# Patient Record
Sex: Male | Born: 1965 | Race: Black or African American | Hispanic: No | Marital: Single | Smoking: Former smoker
Health system: Southern US, Community
[De-identification: ages and names within clinical notes are randomized; demographics above are authoritative.]

## PROBLEM LIST (undated history)

## (undated) DIAGNOSIS — Z8701 Personal history of pneumonia (recurrent): Secondary | ICD-10-CM

## (undated) DIAGNOSIS — I10 Essential (primary) hypertension: Secondary | ICD-10-CM

## (undated) DIAGNOSIS — N184 Chronic kidney disease, stage 4 (severe): Secondary | ICD-10-CM

## (undated) DIAGNOSIS — Z95810 Presence of automatic (implantable) cardiac defibrillator: Secondary | ICD-10-CM

## (undated) DIAGNOSIS — I5020 Unspecified systolic (congestive) heart failure: Secondary | ICD-10-CM

## (undated) DIAGNOSIS — I471 Supraventricular tachycardia, unspecified: Secondary | ICD-10-CM

## (undated) DIAGNOSIS — R040 Epistaxis: Secondary | ICD-10-CM

## (undated) DIAGNOSIS — I472 Ventricular tachycardia: Secondary | ICD-10-CM

## (undated) DIAGNOSIS — I429 Cardiomyopathy, unspecified: Secondary | ICD-10-CM

## (undated) DIAGNOSIS — D649 Anemia, unspecified: Secondary | ICD-10-CM

## (undated) DIAGNOSIS — I2510 Atherosclerotic heart disease of native coronary artery without angina pectoris: Secondary | ICD-10-CM

## (undated) DIAGNOSIS — E785 Hyperlipidemia, unspecified: Secondary | ICD-10-CM

## (undated) DIAGNOSIS — I4729 Other ventricular tachycardia: Secondary | ICD-10-CM

## (undated) HISTORY — DX: Anemia, unspecified: D64.9

## (undated) HISTORY — DX: Essential (primary) hypertension: I10

## (undated) HISTORY — DX: Atherosclerotic heart disease of native coronary artery without angina pectoris: I25.10

## (undated) HISTORY — DX: Hyperlipidemia, unspecified: E78.5

---

## 2006-10-24 ENCOUNTER — Ambulatory Visit: Payer: Self-pay | Admitting: Cardiology

## 2006-12-14 ENCOUNTER — Ambulatory Visit: Payer: Self-pay | Admitting: Cardiology

## 2006-12-14 ENCOUNTER — Ambulatory Visit: Payer: Self-pay | Admitting: Internal Medicine

## 2006-12-14 ENCOUNTER — Inpatient Hospital Stay (HOSPITAL_COMMUNITY): Admission: EM | Admit: 2006-12-14 | Discharge: 2006-12-21 | Payer: Self-pay | Admitting: Internal Medicine

## 2006-12-16 ENCOUNTER — Encounter: Payer: Self-pay | Admitting: Cardiology

## 2006-12-20 ENCOUNTER — Ambulatory Visit: Payer: Self-pay | Admitting: Gastroenterology

## 2007-01-07 ENCOUNTER — Ambulatory Visit: Payer: Self-pay | Admitting: Cardiology

## 2007-01-07 LAB — CONVERTED CEMR LAB
Basophils Relative: 0.2 % (ref 0.0–1.0)
CO2: 28 meq/L (ref 19–32)
Calcium: 8.6 mg/dL (ref 8.4–10.5)
Chloride: 106 meq/L (ref 96–112)
Creatinine, Ser: 2.1 mg/dL — ABNORMAL HIGH (ref 0.4–1.5)
Eosinophils Relative: 3.8 % (ref 0.0–5.0)
GFR calc Af Amer: 45 mL/min
Glucose, Bld: 98 mg/dL (ref 70–99)
Lymphocytes Relative: 17 % (ref 12.0–46.0)
Neutro Abs: 4.7 10*3/uL (ref 1.4–7.7)
Platelets: 277 10*3/uL (ref 150–400)
WBC: 6.7 10*3/uL (ref 4.5–10.5)

## 2007-01-13 ENCOUNTER — Ambulatory Visit: Payer: Self-pay

## 2007-01-13 LAB — CONVERTED CEMR LAB
CO2: 30 meq/L (ref 19–32)
Chloride: 108 meq/L (ref 96–112)
Creatinine, Ser: 2.1 mg/dL — ABNORMAL HIGH (ref 0.4–1.5)
Glucose, Bld: 82 mg/dL (ref 70–99)
Sodium: 143 meq/L (ref 135–145)

## 2007-02-19 ENCOUNTER — Ambulatory Visit: Payer: Self-pay | Admitting: Cardiology

## 2007-05-21 ENCOUNTER — Ambulatory Visit: Payer: Self-pay | Admitting: Cardiology

## 2007-07-02 ENCOUNTER — Ambulatory Visit: Payer: Self-pay | Admitting: Cardiology

## 2007-08-06 ENCOUNTER — Ambulatory Visit: Payer: Self-pay | Admitting: Cardiology

## 2007-10-07 ENCOUNTER — Inpatient Hospital Stay (HOSPITAL_COMMUNITY): Admission: EM | Admit: 2007-10-07 | Discharge: 2007-10-09 | Payer: Self-pay | Admitting: Emergency Medicine

## 2007-10-07 ENCOUNTER — Ambulatory Visit: Payer: Self-pay | Admitting: Cardiology

## 2008-01-07 ENCOUNTER — Encounter: Payer: Self-pay | Admitting: Cardiology

## 2008-01-07 ENCOUNTER — Ambulatory Visit: Payer: Self-pay | Admitting: Cardiology

## 2008-01-07 ENCOUNTER — Inpatient Hospital Stay (HOSPITAL_COMMUNITY): Admission: EM | Admit: 2008-01-07 | Discharge: 2008-01-09 | Payer: Self-pay | Admitting: Emergency Medicine

## 2008-02-06 ENCOUNTER — Inpatient Hospital Stay (HOSPITAL_COMMUNITY): Admission: EM | Admit: 2008-02-06 | Discharge: 2008-02-08 | Payer: Self-pay | Admitting: Emergency Medicine

## 2008-03-04 ENCOUNTER — Emergency Department (HOSPITAL_COMMUNITY): Admission: EM | Admit: 2008-03-04 | Discharge: 2008-03-04 | Payer: Self-pay | Admitting: Emergency Medicine

## 2008-03-25 ENCOUNTER — Inpatient Hospital Stay (HOSPITAL_COMMUNITY): Admission: EM | Admit: 2008-03-25 | Discharge: 2008-03-29 | Payer: Self-pay | Admitting: Emergency Medicine

## 2008-03-26 ENCOUNTER — Encounter: Payer: Self-pay | Admitting: Internal Medicine

## 2008-03-30 ENCOUNTER — Ambulatory Visit: Payer: Self-pay | Admitting: Internal Medicine

## 2008-04-22 ENCOUNTER — Ambulatory Visit: Payer: Self-pay | Admitting: Cardiology

## 2008-04-22 ENCOUNTER — Inpatient Hospital Stay (HOSPITAL_COMMUNITY): Admission: EM | Admit: 2008-04-22 | Discharge: 2008-04-26 | Payer: Self-pay | Admitting: Emergency Medicine

## 2008-04-23 ENCOUNTER — Encounter (INDEPENDENT_AMBULATORY_CARE_PROVIDER_SITE_OTHER): Payer: Self-pay | Admitting: Family Medicine

## 2008-05-19 ENCOUNTER — Ambulatory Visit: Payer: Self-pay | Admitting: Cardiology

## 2008-05-19 ENCOUNTER — Inpatient Hospital Stay (HOSPITAL_COMMUNITY): Admission: AD | Admit: 2008-05-19 | Discharge: 2008-05-21 | Payer: Self-pay | Admitting: Cardiology

## 2008-05-28 ENCOUNTER — Ambulatory Visit: Payer: Self-pay | Admitting: Cardiology

## 2008-05-28 ENCOUNTER — Ambulatory Visit: Payer: Self-pay

## 2008-05-28 LAB — CONVERTED CEMR LAB
BUN: 19 mg/dL (ref 6–23)
CO2: 28 meq/L (ref 19–32)
Calcium: 8.5 mg/dL (ref 8.4–10.5)
Creatinine, Ser: 2.2 mg/dL — ABNORMAL HIGH (ref 0.4–1.5)
Glucose, Bld: 95 mg/dL (ref 70–99)

## 2008-06-09 ENCOUNTER — Inpatient Hospital Stay (HOSPITAL_COMMUNITY): Admission: EM | Admit: 2008-06-09 | Discharge: 2008-06-12 | Payer: Self-pay | Admitting: Emergency Medicine

## 2008-07-08 ENCOUNTER — Emergency Department (HOSPITAL_COMMUNITY): Admission: EM | Admit: 2008-07-08 | Discharge: 2008-07-08 | Payer: Self-pay | Admitting: Emergency Medicine

## 2008-07-25 ENCOUNTER — Inpatient Hospital Stay (HOSPITAL_COMMUNITY): Admission: EM | Admit: 2008-07-25 | Discharge: 2008-07-28 | Payer: Self-pay | Admitting: Emergency Medicine

## 2008-07-25 ENCOUNTER — Ambulatory Visit: Payer: Self-pay | Admitting: Cardiology

## 2008-09-08 ENCOUNTER — Ambulatory Visit: Payer: Self-pay | Admitting: Cardiology

## 2008-10-03 ENCOUNTER — Inpatient Hospital Stay (HOSPITAL_COMMUNITY): Admission: EM | Admit: 2008-10-03 | Discharge: 2008-10-08 | Payer: Self-pay | Admitting: Emergency Medicine

## 2008-10-03 ENCOUNTER — Ambulatory Visit: Payer: Self-pay | Admitting: Cardiology

## 2008-10-06 ENCOUNTER — Encounter: Payer: Self-pay | Admitting: Cardiology

## 2008-11-22 DIAGNOSIS — N189 Chronic kidney disease, unspecified: Secondary | ICD-10-CM

## 2008-11-22 DIAGNOSIS — D649 Anemia, unspecified: Secondary | ICD-10-CM | POA: Insufficient documentation

## 2008-11-22 DIAGNOSIS — E785 Hyperlipidemia, unspecified: Secondary | ICD-10-CM

## 2008-11-22 DIAGNOSIS — I5022 Chronic systolic (congestive) heart failure: Secondary | ICD-10-CM | POA: Insufficient documentation

## 2008-11-22 DIAGNOSIS — I2510 Atherosclerotic heart disease of native coronary artery without angina pectoris: Secondary | ICD-10-CM | POA: Insufficient documentation

## 2008-11-22 DIAGNOSIS — I10 Essential (primary) hypertension: Secondary | ICD-10-CM | POA: Insufficient documentation

## 2008-11-24 ENCOUNTER — Ambulatory Visit: Payer: Self-pay | Admitting: Cardiology

## 2008-12-16 ENCOUNTER — Inpatient Hospital Stay (HOSPITAL_COMMUNITY): Admission: EM | Admit: 2008-12-16 | Discharge: 2008-12-19 | Payer: Self-pay | Admitting: Emergency Medicine

## 2009-01-19 ENCOUNTER — Ambulatory Visit: Payer: Self-pay | Admitting: Cardiology

## 2009-02-28 ENCOUNTER — Encounter (INDEPENDENT_AMBULATORY_CARE_PROVIDER_SITE_OTHER): Payer: Self-pay | Admitting: *Deleted

## 2009-04-25 ENCOUNTER — Ambulatory Visit: Payer: Self-pay | Admitting: Internal Medicine

## 2009-04-26 ENCOUNTER — Inpatient Hospital Stay (HOSPITAL_COMMUNITY): Admission: EM | Admit: 2009-04-26 | Discharge: 2009-04-28 | Payer: Self-pay | Admitting: Emergency Medicine

## 2009-04-26 ENCOUNTER — Encounter: Payer: Self-pay | Admitting: Cardiology

## 2009-05-10 ENCOUNTER — Inpatient Hospital Stay (HOSPITAL_COMMUNITY): Admission: AD | Admit: 2009-05-10 | Discharge: 2009-05-12 | Payer: Self-pay | Admitting: Cardiology

## 2009-05-10 ENCOUNTER — Ambulatory Visit: Payer: Self-pay | Admitting: Cardiology

## 2009-05-22 ENCOUNTER — Ambulatory Visit: Payer: Self-pay | Admitting: Cardiology

## 2009-05-22 ENCOUNTER — Encounter: Payer: Self-pay | Admitting: Emergency Medicine

## 2009-05-23 ENCOUNTER — Inpatient Hospital Stay (HOSPITAL_COMMUNITY): Admission: RE | Admit: 2009-05-23 | Discharge: 2009-05-26 | Payer: Self-pay | Admitting: Cardiology

## 2009-06-02 ENCOUNTER — Inpatient Hospital Stay (HOSPITAL_COMMUNITY): Admission: EM | Admit: 2009-06-02 | Discharge: 2009-06-04 | Payer: Self-pay | Admitting: Emergency Medicine

## 2009-06-02 ENCOUNTER — Ambulatory Visit: Payer: Self-pay | Admitting: Cardiology

## 2009-06-15 ENCOUNTER — Ambulatory Visit: Payer: Self-pay | Admitting: Cardiology

## 2009-06-25 ENCOUNTER — Inpatient Hospital Stay (HOSPITAL_COMMUNITY): Admission: EM | Admit: 2009-06-25 | Discharge: 2009-06-29 | Payer: Self-pay | Admitting: Emergency Medicine

## 2009-06-25 ENCOUNTER — Ambulatory Visit: Payer: Self-pay | Admitting: Cardiology

## 2009-07-13 ENCOUNTER — Ambulatory Visit: Payer: Self-pay | Admitting: Cardiology

## 2009-07-16 ENCOUNTER — Inpatient Hospital Stay (HOSPITAL_COMMUNITY): Admission: EM | Admit: 2009-07-16 | Discharge: 2009-07-18 | Payer: Self-pay | Admitting: Emergency Medicine

## 2009-07-16 ENCOUNTER — Ambulatory Visit: Payer: Self-pay | Admitting: Cardiology

## 2009-07-27 ENCOUNTER — Telehealth (INDEPENDENT_AMBULATORY_CARE_PROVIDER_SITE_OTHER): Payer: Self-pay | Admitting: *Deleted

## 2009-08-10 ENCOUNTER — Encounter: Payer: Self-pay | Admitting: Cardiology

## 2009-08-10 ENCOUNTER — Ambulatory Visit: Payer: Self-pay | Admitting: Cardiology

## 2009-09-05 ENCOUNTER — Inpatient Hospital Stay (HOSPITAL_COMMUNITY): Admission: EM | Admit: 2009-09-05 | Discharge: 2009-09-08 | Payer: Self-pay | Admitting: Emergency Medicine

## 2009-10-19 ENCOUNTER — Ambulatory Visit: Payer: Self-pay | Admitting: Cardiology

## 2009-12-07 ENCOUNTER — Ambulatory Visit: Payer: Self-pay | Admitting: Cardiology

## 2009-12-29 ENCOUNTER — Telehealth (INDEPENDENT_AMBULATORY_CARE_PROVIDER_SITE_OTHER): Payer: Self-pay | Admitting: *Deleted

## 2010-01-01 ENCOUNTER — Inpatient Hospital Stay (HOSPITAL_COMMUNITY): Admission: EM | Admit: 2010-01-01 | Discharge: 2010-01-03 | Payer: Self-pay | Admitting: Emergency Medicine

## 2010-01-04 ENCOUNTER — Ambulatory Visit: Payer: Self-pay | Admitting: Cardiology

## 2010-02-22 ENCOUNTER — Ambulatory Visit: Payer: Self-pay | Admitting: Internal Medicine

## 2010-02-22 ENCOUNTER — Ambulatory Visit: Payer: Self-pay | Admitting: Vascular Surgery

## 2010-02-22 ENCOUNTER — Inpatient Hospital Stay (HOSPITAL_COMMUNITY): Admission: EM | Admit: 2010-02-22 | Discharge: 2010-02-26 | Payer: Self-pay | Admitting: Emergency Medicine

## 2010-02-22 ENCOUNTER — Encounter (INDEPENDENT_AMBULATORY_CARE_PROVIDER_SITE_OTHER): Payer: Self-pay | Admitting: Internal Medicine

## 2010-02-26 IMAGING — CR DG CHEST 2V
2 series · 2 of 2 positions shown · non-contrast
Comparison: 10/03/2008

CLINICAL DATA: Chest pain

CHEST - 2 VIEW

[view not recorded (1 of 2)]
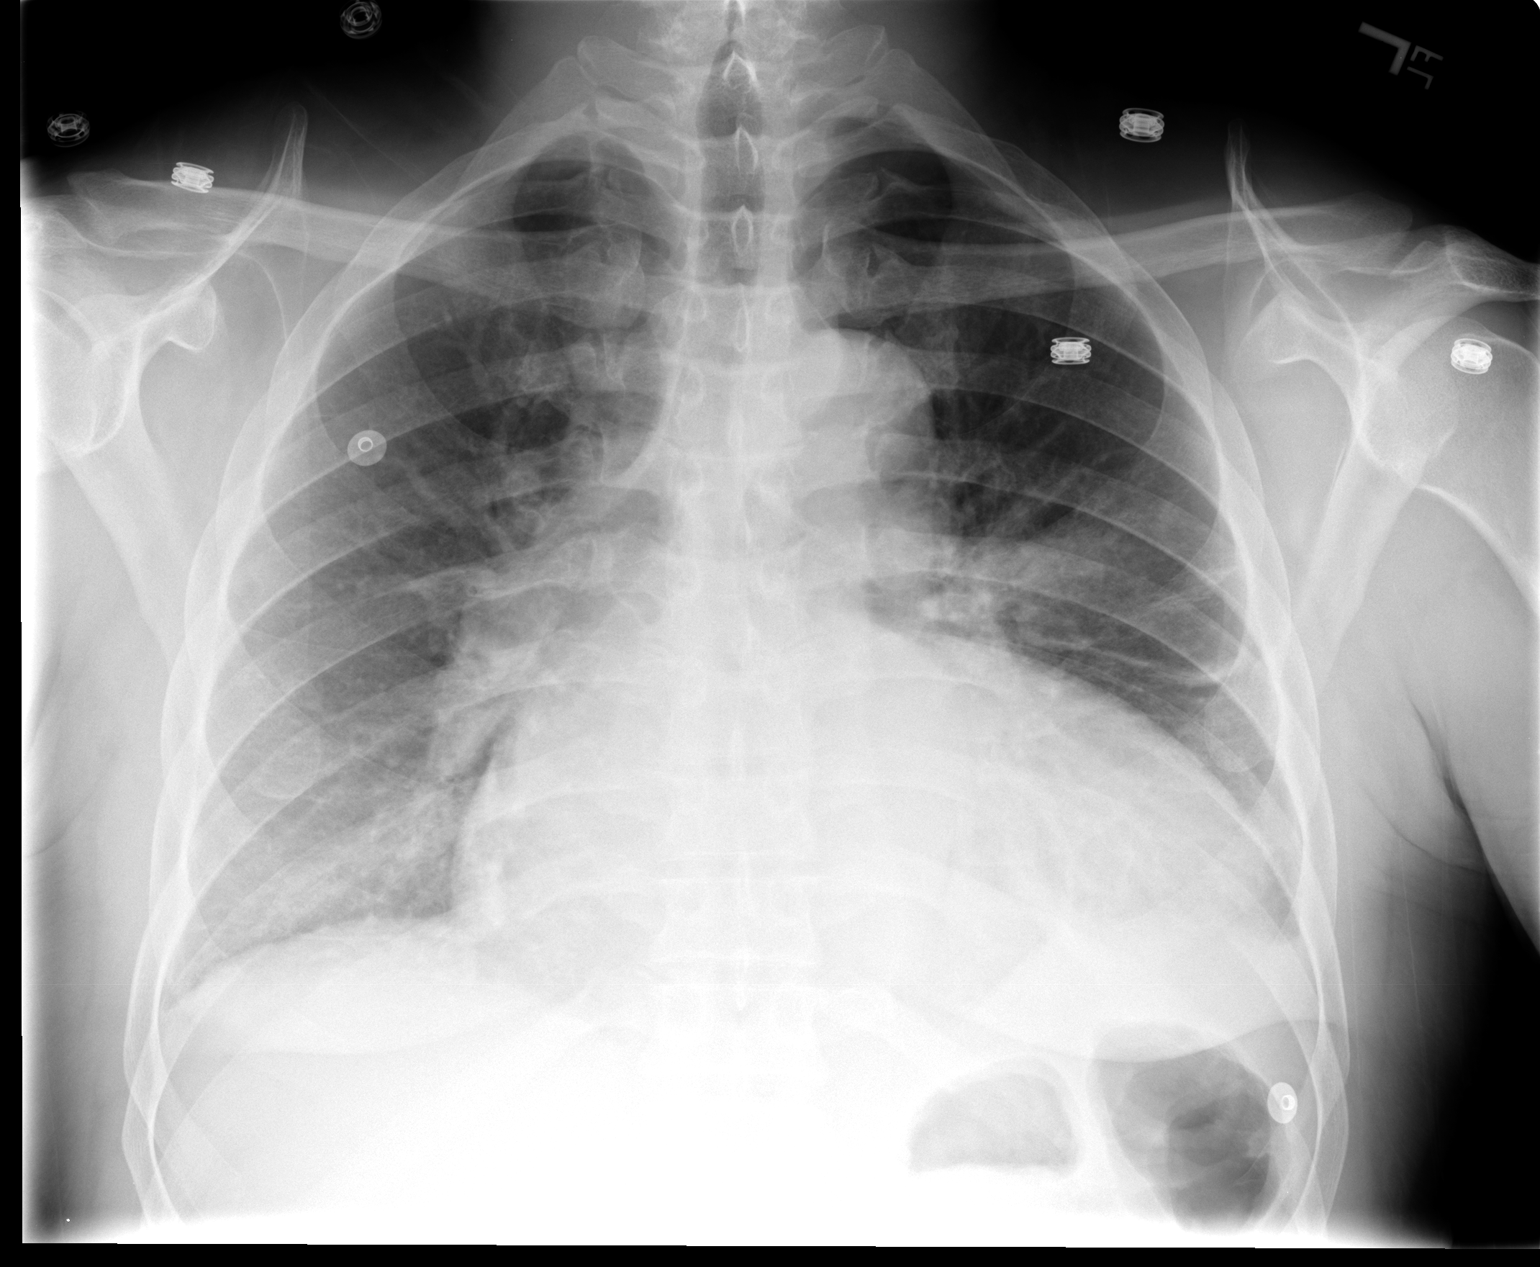

[view not recorded (2 of 2)]
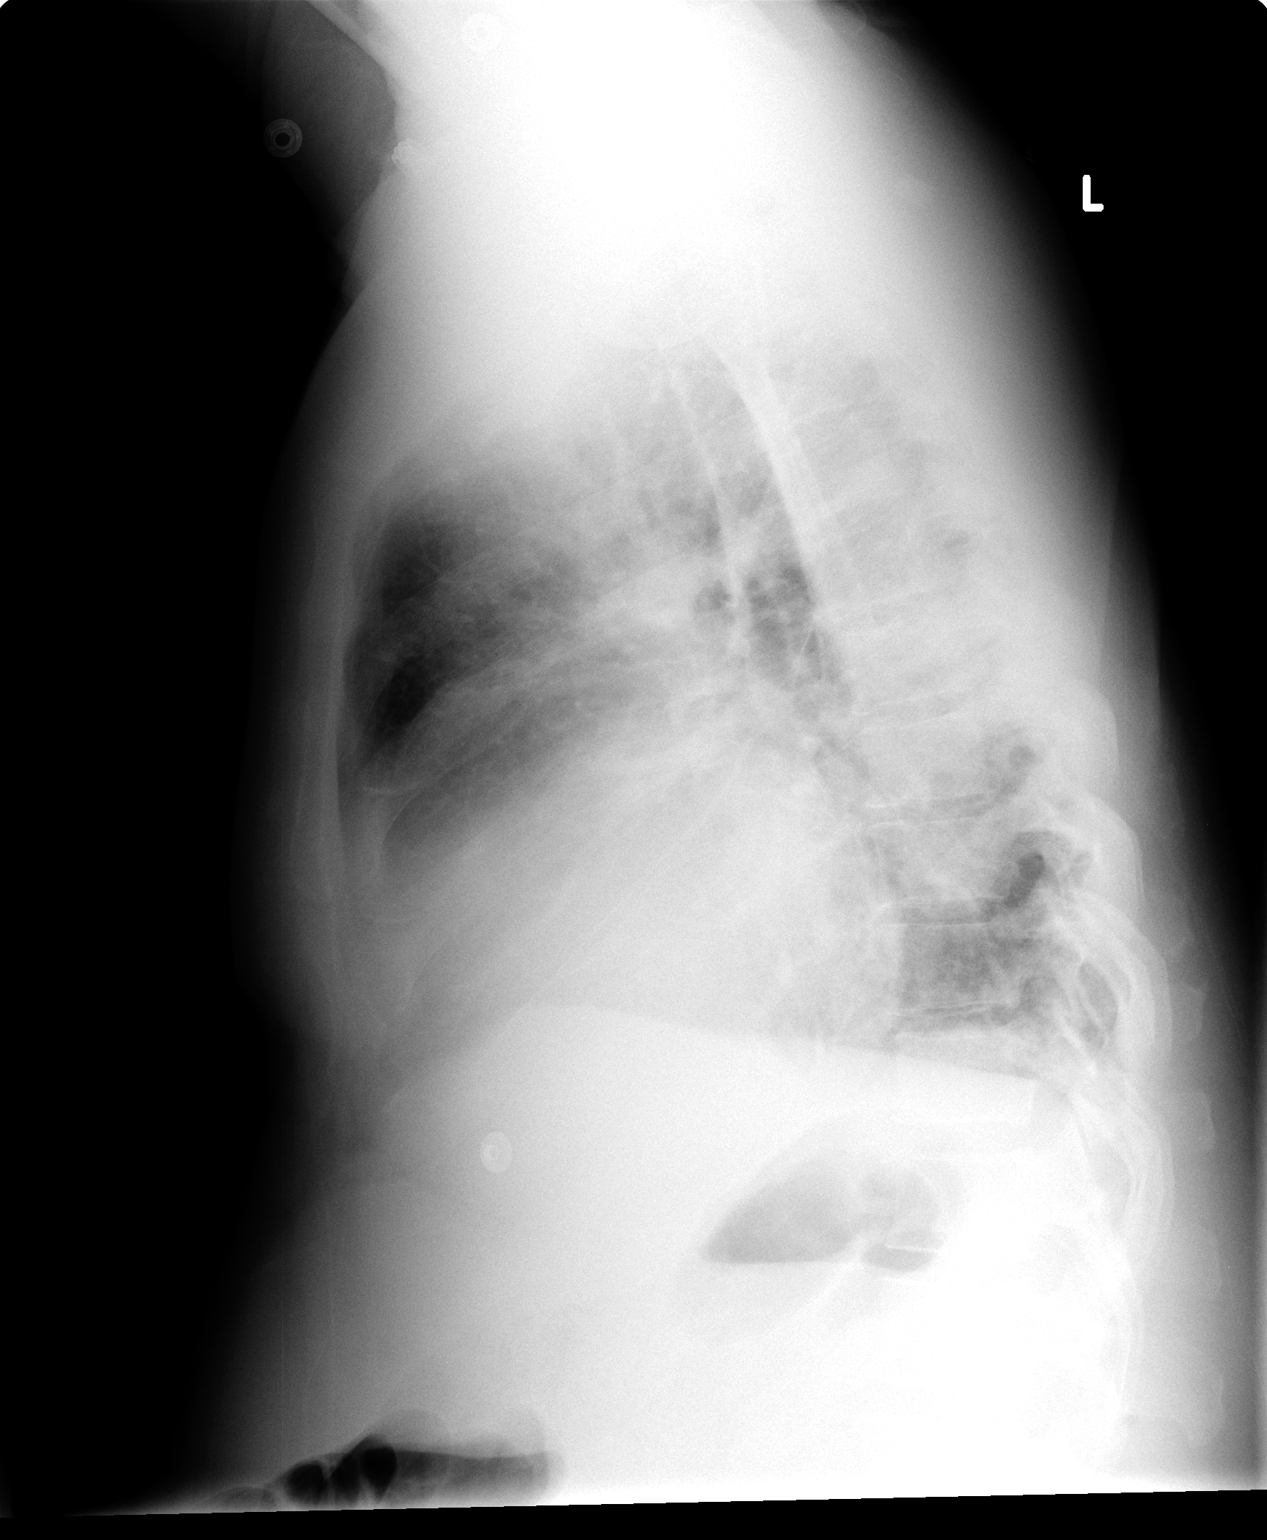

[2 of 2 positions shown; findings below may reference images not displayed]

FINDINGS: Cardiomegaly with pulmonary vascular congestion.
Minimal interstitial edema compatible with CHF.
Bibasilar atelectasis.
Minimal peribronchial thickening.
Elongation of aorta.
No pneumothorax or segmental consolidation.
Tiny left pleural effusion.
IMPRESSION: Mild CHF with bibasilar atelectasis.

REF:A1 DICTATED: 10/05/2008 [DATE]

## 2010-03-03 ENCOUNTER — Encounter: Payer: Self-pay | Admitting: Internal Medicine

## 2010-03-07 ENCOUNTER — Encounter: Payer: Self-pay | Admitting: Cardiology

## 2010-03-08 ENCOUNTER — Ambulatory Visit: Payer: Self-pay | Admitting: Cardiology

## 2010-03-28 ENCOUNTER — Ambulatory Visit: Payer: Self-pay | Admitting: Cardiology

## 2010-03-28 ENCOUNTER — Inpatient Hospital Stay (HOSPITAL_COMMUNITY): Admission: EM | Admit: 2010-03-28 | Discharge: 2010-04-01 | Payer: Self-pay | Admitting: Emergency Medicine

## 2010-03-28 ENCOUNTER — Encounter (INDEPENDENT_AMBULATORY_CARE_PROVIDER_SITE_OTHER): Payer: Self-pay | Admitting: Internal Medicine

## 2010-03-29 ENCOUNTER — Encounter (INDEPENDENT_AMBULATORY_CARE_PROVIDER_SITE_OTHER): Payer: Self-pay | Admitting: Internal Medicine

## 2010-03-29 ENCOUNTER — Ambulatory Visit: Payer: Self-pay | Admitting: Vascular Surgery

## 2010-04-28 ENCOUNTER — Inpatient Hospital Stay (HOSPITAL_COMMUNITY): Admission: EM | Admit: 2010-04-28 | Discharge: 2010-05-02 | Payer: Self-pay | Admitting: Emergency Medicine

## 2010-04-28 ENCOUNTER — Ambulatory Visit: Payer: Self-pay | Admitting: Internal Medicine

## 2010-05-03 ENCOUNTER — Encounter: Payer: Self-pay | Admitting: Cardiology

## 2010-05-19 ENCOUNTER — Encounter: Payer: Self-pay | Admitting: Cardiology

## 2010-06-14 ENCOUNTER — Telehealth (INDEPENDENT_AMBULATORY_CARE_PROVIDER_SITE_OTHER): Payer: Self-pay | Admitting: *Deleted

## 2010-06-15 ENCOUNTER — Encounter: Payer: Self-pay | Admitting: Cardiology

## 2010-09-03 HISTORY — PX: CARDIAC CATHETERIZATION: SHX172

## 2010-10-03 NOTE — Progress Notes (Signed)
   Request recieved from DDS sent to [facility]  December 29, 2009 9:49 AM

## 2010-10-03 NOTE — Assessment & Plan Note (Signed)
Summary: [name]      Allergies Added: NKDA  Visit Type:  Follow-up Primary Provider:  None  CC:  HTN and CHF.  History of Present Illness: The patient presents for followup of his very difficult to control hypertension. For the first time his blood pressure in the office is below 200/100. He's been compliant with all of his medications. He says he's feeling well and not having any new shortness of breath, PND or orthopnea. He's had no weight gain. He's had no chest pressure, neck or arm discomfort.  Current Medications (verified): 1)  Furosemide 40 Mg Tabs (Furosemide) .Marland Kitchen.. 1 By Mouth Two Times A Day 2)  Coreg 25 Mg Tabs (Carvedilol) .Marland Kitchen.. 1 By Mouth Two Times A Day 3)  Hydralazine Hcl 100 Mg Tabs (Hydralazine Hcl) .Marland Kitchen.. 1 Tabler Three Times A Day 4)  Clonidine Hcl 0.2 Mg Tabs (Clonidine Hcl) .Marland Kitchen.. 1 By Mouth Three Times A Day 5)  Aspirin 325 Mg  Tabs (Aspirin) .Marland Kitchen.. 1 By Mouth Daily 6)  Nitrostat 0.4 Mg Subl (Nitroglycerin) .... As Needed 7)  Ferrous Sulfate 325 (65 Fe) Mg Tabs (Ferrous Sulfate) .Marland Kitchen.. 1 By Mouth Two Times A Day 8)  Simvastatin 20 Mg Tabs (Simvastatin) .Marland Kitchen.. 1 By Mouth Daily 9)  Isosorbide Mononitrate Cr 120 Mg Xr24h-Tab (Isosorbide Mononitrate) .... One Daily  Allergies (verified): No Known Drug Allergies  Past History:  Past Medical History: Reviewed history from 11/22/2008 and no changes required. 1. Coronary artery disease.       a.     History of non-ST-elevation myocardial infarction, April        2008, in the setting of profound epistaxis and anemia.  Cardiac        catheterization at that time demonstrated a 50% mid left        circumflex lesion.  His RCA was severely diseased with 80%        proximal disease in 3 separate locations, 90% mid disease with        ulcerated plaque, RV marginal totally occluded, and the mid RCA        was totally occluded with left-to-right collaterals to the distal        RCA.  Medical therapy was pursued at that  time.   2. Echocardiogram, August 2009, EF 25%, apical/posterior akinesis,       inferior akinesis, moderate LVH, mild MR, mild decreased RV       function.   3. Hypertension.   4. Hyperlipidemia.   5. Chronic kidney disease with average creatinine of about 2.3.   6. Chronic systolic congestive heart failure.   7. Ischemic cardiomyopathy.   8. Anemia.   Review of Systems       As stated in the HPI and negative for all other systems.   Vital Signs:  Patient profile:   45 year old male Height:      70 inches Weight:      199 pounds BMI:     28.66 Pulse rate:   64 / minute Resp:     16 per minute BP sitting:   150 / 98  (right arm)  Vitals Entered By: Marrion Coy, CNA (December 07, 2009 1:40 PM)  Physical Exam  General:  Well developed, well nourished, in no acute distress. Head:  normocephalic and atraumatic Eyes:  PERRLA/EOM intact; conjunctiva and lids normal. Mouth:  Teeth, gums and palate normal. Oral mucosa normal. Neck:  Neck supple, no JVD. No masses, thyromegaly or  abnormal cervical nodes. Chest Wall:  no deformities or breast masses noted Lungs:  Clear bilaterally to auscultation and percussion. Heart:  S1 and S2 within normal limits, no S3, positive S4, no clicks, no rubs, no murmurs. Abdomen:  Bowel sounds positive; abdomen soft and non-tender without masses, organomegaly, or hernias noted. No hepatosplenomegaly. Msk:  Back normal, normal gait. Muscle strength and tone normal. Pulses:  pulses normal in all 4 extremities Extremities:  No clubbing or cyanosis. Neurologic:  Alert and oriented x 3. Skin:  Intact without lesions or rashes. Psych:  Normal affect.   Impression & Recommendations:  Problem # 1:  CHRONIC SYSTOLIC HEART FAILURE (ICD-428.22) He seems to be doing well with class I symptoms. No further cardiovascular testing or change in therapy is indicated.  Problem # 2:  HYPERTENSION (ICD-401.9) This is as good as his blood pressure has been since I've  been taking care of him. I would not change his regimen at this point. I suspect improved adherence is the reason for his blood pressure control.  Problem # 3:  CAD (ICD-414.00) He his having no chest pain. No further cardiovascular testing is suggested.  Patient Instructions: 1)  Your physician recommends that you schedule a follow-up appointment in: 4 months in [place] 2)  Your physician recommends that you continue on your current medications as directed. Please refer to the Current Medication list given to you today.

## 2010-10-03 NOTE — Letter (Signed)
Summary: Appointment -missed  [facility] [address]   [place], [place] [address]   Phone: [phone]  Fax: [phone]     March 03, 2010 MRN: [id number]     HEVER CASTILLEJA [address] [place], [place]  [address]     Dear Mr. ZUNKER,  Our records indicate you missed your appointment on March 03, 2010                        with Dr. Johney Frame.   It is very important that we reach you to reschedule this appointment. We look forward to participating in your health care needs.   Please contact us at the number listed above at your earliest convenience to reschedule this appointment.   Sincerely,    Glass blower/designer

## 2010-10-03 NOTE — Assessment & Plan Note (Signed)
Summary: [facility]  Medications Added COREG 25 MG TABS (CARVEDILOL) 1 by mouth two times a day NITROSTAT 0.4 MG SUBL (NITROGLYCERIN) as needed ISOSORBIDE MONONITRATE CR 120 MG XR24H-TAB (ISOSORBIDE MONONITRATE) out      Allergies Added: NKDA  Visit Type:  Follow-up Primary Provider:  None  CC:  Cardiomyopathy/HTN.  History of Present Illness: The patient presents for followup of his cardiomyopathy and hypertension. Unfortunately he's been out of his isosorbide for several days. He's been taking his other medications. He actually feels well. He denies any shortness of breath, PND or orthopnea. He is not having any chest pressure, neck or arm discomfort. He is not having any palpitations, presyncope or syncope. He has had no weight gain or edema. He is compliant with salt restriction. He denies any substance abuse.  Current Medications (verified): 1)  Furosemide 40 Mg Tabs (Furosemide) .Marland Kitchen.. 1 By Mouth Two Times A Day 2)  Coreg 25 Mg Tabs (Carvedilol) .Marland Kitchen.. 1 By Mouth Two Times A Day 3)  Hydralazine Hcl 100 Mg Tabs (Hydralazine Hcl) .Marland Kitchen.. 1 Tabler Three Times A Day 4)  Clonidine Hcl 0.2 Mg Tabs (Clonidine Hcl) .Marland Kitchen.. 1 By Mouth Three Times A Day 5)  Aspirin 325 Mg  Tabs (Aspirin) .Marland Kitchen.. 1 By Mouth Daily 6)  Nitrostat 0.4 Mg Subl (Nitroglycerin) .... As Needed 7)  Ferrous Sulfate 325 (65 Fe) Mg Tabs (Ferrous Sulfate) .Marland Kitchen.. 1 By Mouth Two Times A Day 8)  Simvastatin 20 Mg Tabs (Simvastatin) .Marland Kitchen.. 1 By Mouth Daily 9)  Isosorbide Mononitrate Cr 120 Mg Xr24h-Tab (Isosorbide Mononitrate) .... Out  Allergies (verified): No Known Drug Allergies  Past History:  Past Medical History: 1. Coronary artery disease.       History of non-ST-elevation myocardial infarction, April        2008, in the setting of profound epistaxis and anemia.  Cardiac        catheterization at that time demonstrated a 50% mid left        circumflex lesion.  His RCA was severely diseased with 80%        proximal  disease in 3 separate locations, 90% mid disease with        ulcerated plaque, RV marginal totally occluded, and the mid RCA        was totally occluded with left-to-right collaterals to the distal        RCA.  Medical therapy was pursued at that time.   2. Echocardiogram, August 2009, EF 25%, apical/posterior akinesis,       inferior akinesis, moderate LVH, mild MR, mild decreased RV       function.   3. Hypertension.   4. Hyperlipidemia.   5. Chronic kidney disease with average creatinine of about 2.3.   6. Chronic systolic congestive heart failure.   7. Ischemic cardiomyopathy.   8. Anemia.   Review of Systems       As stated in the HPI and negative for all other systems.   Vital Signs:  Patient profile:   45 year old male Height:      70 inches Weight:      191 pounds BMI:     27.50 Pulse rate:   50 / minute Resp:     16 per minute BP sitting:   178 / 120  (right arm)  Vitals Entered By: Marrion Coy, CNA (Jan 04, 2010 2:47 PM)  Physical Exam  General:  Well developed, well nourished, in no acute distress. Head:  normocephalic  and atraumatic Eyes:  PERRLA/EOM intact; conjunctiva and lids normal. Mouth:   Oral mucosa normal. Neck:  Neck supple, no JVD. No masses, thyromegaly or abnormal cervical nodes. Chest Wall:  no deformities or breast masses noted Lungs:  Clear bilaterally to auscultation and percussion. Heart:  S1 and S2 within normal limits, no S3, positive S4, no clicks, no rubs, no murmurs. Abdomen:  Bowel sounds positive; abdomen soft and non-tender without masses, organomegaly, or hernias noted. No hepatosplenomegaly. Msk:  Back normal, normal gait. Muscle strength and tone normal. Extremities:  No clubbing or cyanosis. Neurologic:  Alert and oriented x 3. Skin:  Intact without lesions or rashes. Cervical Nodes:  no significant adenopathy Inguinal Nodes:  no significant adenopathy Psych:  Normal affect.   EKG  Procedure date:   01/04/2010  Findings:      Sinus rhythm, LVH, rate 50, no change from previous  Impression & Recommendations:  Problem # 1:  HYPERTENSION (ICD-401.9) Unfortunately his blood pressure is elevated again today. He was off of his isosorbide. He had the best blood pressure I had seen last time when he was taking all of his meds. He will take an extra half of a clonidine for the next few mornings until he gets his isosorbide renewed. He will continue the other meds.  Problem # 2:  CHRONIC SYSTOLIC HEART FAILURE (ICD-428.22)  The patient has demonstrated compliance over several visits. I think it is now time to consider ICD placement in this patient with mixed ischemic and nonischemic cardiomyopathy. He'll otherwise continue the meds as listed.  Orders: EKG w/ Interpretation (93000) EP Referral (Cardiology EP Ref )  Problem # 3:  CAD (ICD-414.00) He is having no ongoing chest pain. No further cardiovascular testing is suggested.  Patient Instructions: 1)  Your physician recommends that you schedule a follow-up appointment in: 2 months with Dr Antoine Poche in [place] 2)  Your physician recommends that you continue on your current medications as directed. Please refer to the Current Medication list given to you today. 3)  You have been referred to Dr Johney Frame for ICD placement Prescriptions: ISOSORBIDE MONONITRATE CR 120 MG XR24H-TAB (ISOSORBIDE MONONITRATE) out  #30 x 11   Entered by:   Charolotte Capuchin, RN   Authorized by:   Rollene Rotunda, MD, [facility]   Signed by:   Charolotte Capuchin, RN on 01/04/2010   Method used:   Faxed to ...       [facility] [employer] (mail-order)       [address] [place], [place]  [address]       Ph: [phone]       Fax: [phone]   RxID:   [id number] NITROSTAT 0.4 MG SUBL (NITROGLYCERIN) as needed  #75 x 6   Entered by:   Marrion Coy, CNA   Authorized by:   Rollene Rotunda, MD, [facility]   Signed by:   Marrion Coy, CNA on 01/04/2010   Method used:    Faxed to ...       [facility] [employer] [facility] (mail-order)       [address] [place], [place]  [address]       Ph: [phone]       Fax: [phone]   RxID:   [id number]

## 2010-10-03 NOTE — Progress Notes (Signed)
   DDS request recieved sent to [facility]  June 14, 2010 8:41 AM     Appended Document:  Request received from DDS sent to [facility]

## 2010-10-03 NOTE — Miscellaneous (Signed)
  Clinical Lists Changes  Observations: Added new observation of ECHOINTERP:  - Left ventricle: The cavity size was moderately dilated. Systolic     function was severely reduced. The estimated ejection fraction was     in the range of 25% to 35%. Diffuse hypokinesis. There was a     reduced contribution of atrial contraction to ventricular filling,     due to increased ventricular diastolic pressure or atrial     contractile dysfunction. Doppler parameters are consistent with a     reversible restrictive pattern, indicative of decreased left     ventricular diastolic compliance and/or increased left atrial     pressure (grade 3 diastolic dysfunction). Doppler parameters are     consistent with both elevated ventricular end-diastolic filling     pressure and elevated left atrial filling pressure.   - Aortic valve: Mild regurgitation. Valve area: 2.12cm^2 (Vmax).   - Mitral valve: Calcified annulus. Valve area by continuity equation     (using LVOT flow): 1.65cm^2.   - Left atrium: The atrium was severely dilated.   - Right ventricle: Systolic function was normal. Systolic pressure     was increased.   - Right atrium: The atrium was mildly to moderately dilated.   - Pulmonary arteries: PA peak pressure: 37mm Hg (S).   Impressions:    - The right ventricular systolic pressure was increased consistent     with mild pulmonary hypertension. (03/28/2010 9:59)      Echocardiogram  Procedure date:  03/28/2010  Findings:       - Left ventricle: The cavity size was moderately dilated. Systolic     function was severely reduced. The estimated ejection fraction was     in the range of 25% to 35%. Diffuse hypokinesis. There was a     reduced contribution of atrial contraction to ventricular filling,     due to increased ventricular diastolic pressure or atrial     contractile dysfunction. Doppler parameters are consistent with a     reversible restrictive pattern, indicative of decreased  left     ventricular diastolic compliance and/or increased left atrial     pressure (grade 3 diastolic dysfunction). Doppler parameters are     consistent with both elevated ventricular end-diastolic filling     pressure and elevated left atrial filling pressure.   - Aortic valve: Mild regurgitation. Valve area: 2.12cm^2 (Vmax).   - Mitral valve: Calcified annulus. Valve area by continuity equation     (using LVOT flow): 1.65cm^2.   - Left atrium: The atrium was severely dilated.   - Right ventricle: Systolic function was normal. Systolic pressure     was increased.   - Right atrium: The atrium was mildly to moderately dilated.   - Pulmonary arteries: PA peak pressure: 37mm Hg (S).   Impressions:    - The right ventricular systolic pressure was increased consistent     with mild pulmonary hypertension.

## 2010-10-03 NOTE — Assessment & Plan Note (Signed)
Summary: [facility]  Medications Added CLONIDINE HCL 0.2 MG TABS (CLONIDINE HCL) 1 by mouth three times a day FERROUS SULFATE 325 (65 FE) MG TABS (FERROUS SULFATE) 1 by mouth two times a day SIMVASTATIN 20 MG TABS (SIMVASTATIN) 1 by mouth daily ISOSORBIDE MONONITRATE CR 120 MG XR24H-TAB (ISOSORBIDE MONONITRATE) one daily      Allergies Added: NKDA    Visit Type:  Follow-up Primary Provider:  None  CC:  HTN/Cardiomyopathy.  History of Present Illness: The patient presents for followup of the above. He is dealing with continued stress in his life. He lives with his sister and thinks she is going to kick him out of the house. He is getting his medications supplied to him. However, he came to the appointment today not yet taking any of his meds. He does not know what his blood pressure is running at home. Despite this he actually feels okay. He is not having any shortness of breath and has no PND or orthopnea. He is not having any palpitations, presyncope or syncope. He is not having any chest pressure, neck or arm discomfort. He does have episodes of lightheadedness when he's been standing for a while. He has recognized that he has to sit down to avoid passing out.  Current Medications (verified): 1)  Furosemide 40 Mg Tabs (Furosemide) .Marland Kitchen.. 1 By Mouth Two Times A Day 2)  Coreg 25 Mg Tabs (Carvedilol) .Marland Kitchen.. 1 By Mouth Two Times A Day 3)  Hydralazine Hcl 100 Mg Tabs (Hydralazine Hcl) .Marland Kitchen.. 1 Tabler Three Times A Day 4)  Clonidine Hcl 0.2 Mg Tabs (Clonidine Hcl) .Marland Kitchen.. 1 By Mouth Three Times A Day 5)  Aspirin 325 Mg  Tabs (Aspirin) .Marland Kitchen.. 1 By Mouth Daily 6)  Nitrostat 0.4 Mg Subl (Nitroglycerin) .... As Needed 7)  Ferrous Sulfate 325 (65 Fe) Mg Tabs (Ferrous Sulfate) .Marland Kitchen.. 1 By Mouth Two Times A Day 8)  Simvastatin 20 Mg Tabs (Simvastatin) .Marland Kitchen.. 1 By Mouth Daily 9)  Isosorbide Mononitrate Cr 60 Mg Xr24h-Tab (Isosorbide Mononitrate) .Marland Kitchen.. 1 By Mouth Daily  Allergies (verified): No Known  Drug Allergies  Past History:  Past Medical History: Reviewed history from 11/22/2008 and no changes required. 1. Coronary artery disease.       a.     History of non-ST-elevation myocardial infarction, April        2008, in the setting of profound epistaxis and anemia.  Cardiac        catheterization at that time demonstrated a 50% mid left        circumflex lesion.  His RCA was severely diseased with 80%        proximal disease in 3 separate locations, 90% mid disease with        ulcerated plaque, RV marginal totally occluded, and the mid RCA        was totally occluded with left-to-right collaterals to the distal        RCA.  Medical therapy was pursued at that time.   2. Echocardiogram, August 2009, EF 25%, apical/posterior akinesis,       inferior akinesis, moderate LVH, mild MR, mild decreased RV       function.   3. Hypertension.   4. Hyperlipidemia.   5. Chronic kidney disease with average creatinine of about 2.3.   6. Chronic systolic congestive heart failure.   7. Ischemic cardiomyopathy.   8. Anemia.   Past Surgical History: Reviewed history from 11/22/2008 and no changes required. Cardiac catheterization  Review  of Systems       As stated in the HPI and negative for all other systems.   Vital Signs:  Patient profile:   45 year old male Height:      70 inches Weight:      199 pounds BMI:     28.66 Pulse rate:   61 / minute Resp:     16 per minute BP sitting:   200 / 130  (right arm)  Vitals Entered By: Marrion Coy, CNA (October 19, 2009 2:08 PM)  Serial Vital Signs/Assessments:  Time      Position  BP       Pulse  Resp  Temp     By 2:28pm              184/126                        Marrion Coy, CNA 3:55pm              198/136                        Marrion Coy, CNA   Physical Exam  General:  Well developed, well nourished, in no acute distress. Head:  normocephalic and atraumatic Eyes:  PERRLA/EOM intact; conjunctiva and lids normal. Mouth:   Teeth, gums and palate normal. Oral mucosa normal. Neck:  Neck supple, no JVD. No masses, thyromegaly or abnormal cervical nodes. Chest Wall:  no deformities or breast masses noted Lungs:  Clear bilaterally to auscultation and percussion. Abdomen:  Bowel sounds positive; abdomen soft and non-tender without masses, organomegaly, or hernias noted. No hepatosplenomegaly. Msk:  Back normal, normal gait. Muscle strength and tone normal. Extremities:  No clubbing or cyanosis. Neurologic:  Alert and oriented x 3. Skin:  Intact without lesions or rashes. Cervical Nodes:  no significant adenopathy Inguinal Nodes:  no significant adenopathy Psych:  Normal affect.   Detailed Cardiovascular Exam  Neck    Carotids: Carotids full and equal bilaterally without bruits.      Neck Veins: Normal, no JVD.    Heart    Inspection: no deformities or lifts noted.      Palpation: normal PMI with no thrills palpable.      Auscultation: regular rate and rhythm, S1, S2 without murmurs, rubs, gallops, or clicks.    Vascular    Abdominal Aorta: no palpable masses, pulsations, or audible bruits.      Femoral Pulses: normal femoral pulses bilaterally.      Pedal Pulses: normal pedal pulses bilaterally.      Radial Pulses: normal radial pulses bilaterally.      Peripheral Circulation: no clubbing, cyanosis, or edema noted with normal capillary refill.     EKG  Procedure date:  10/19/2009  Findings:      sinus rhythm, rate 61, left ventricular hypertrophy, left atrial enlargement, interventricular conduction delay, left axis deviation, no change from previous.  Impression & Recommendations:  Problem # 1:  HYPERTENSION (ICD-401.9) Mr Meidinger continues to pose a very significant problem with his hypertension compounded by his social situation. He is engaged more than in the past in his therapies but presented today for 2 PM appointment but not having taken any of his morning meds. We kept him here for quite  a while bringing his blood pressure down with clonidine. I did increase his Imdur to 120 mg daily. I spoke with Dr. Nolon Rod at [facility] today to  see if there were any options for percutaneous sympathetic nephrectomy as a treatment for refractory hypertension. Success has been reported in clinical trials but he does not think that this is being offered in this country and he has researched this recently. Orders: EKG w/ Interpretation ([address])  Problem # 2:  CHRONIC SYSTOLIC HEART FAILURE (ICD-428.22) He actually seems to be euvolemic but he is always tenuous. He will continue on the meds as listed. Of note with his severe chronic medical conditions I am trying to help him get disability. He should certainly qualify for this. Orders: EKG w/ Interpretation (93000)  Problem # 3:  CAD (ICD-414.00) He has no anginal symptoms currently. He will continue with risk reduction. Orders: EKG w/ Interpretation (93000)  Patient Instructions: 1)  Your physician recommends that you schedule a follow-up appointment in: 6 weeks with Dr Antoine Poche 2)  Your physician has recommended you make the following change in your medication:   Increase your Isosorbide to 120 mg daily Prescriptions: ISOSORBIDE MONONITRATE CR 120 MG XR24H-TAB (ISOSORBIDE MONONITRATE) one daily  #90 x 4   Entered by:   Charolotte Capuchin, RN   Authorized by:   Rollene Rotunda, MD, [facility]   Signed by:   Charolotte Capuchin, RN on 10/19/2009   Method used:   Faxed to ...       [facility] [employer] (mail-order)       [address] [place], [place]  [address]       Ph: [phone]       Fax: [phone]   RxID:   [id number] ISOSORBIDE MONONITRATE CR 120 MG XR24H-TAB (ISOSORBIDE MONONITRATE) one daily  #30 x 11   Entered by:   Charolotte Capuchin, RN   Authorized by:   Rollene Rotunda, MD, [facility]   Signed by:   Charolotte Capuchin, RN on 10/19/2009   Method used:   Electronically to        [facility]  [address] (retail)       [address]       [place], [place]  [address]       Ph: [phone]       Fax: [phone]   RxID:   [id number]

## 2010-10-03 NOTE — Miscellaneous (Signed)
Summary: Home Health Certification/Care Plan  Home Health Certification/Care Plan   Imported By: Roderic Ovens 06/01/2010 12:04:36  _____________________________________________________________________  External Attachment:    Type:   Image     Comment:   External Document

## 2010-10-03 NOTE — Assessment & Plan Note (Signed)
Summary: [facility]  Medications Added FUROSEMIDE 40 MG TABS (FUROSEMIDE) 1 by mouth two times a day--OUT CLONIDINE HCL 0.2 MG TABS (CLONIDINE HCL) 1 by mouth three times a day--OUT SIMVASTATIN 20 MG TABS (SIMVASTATIN) OUT      Allergies Added: NKDA  Visit Type:  Follow-up Primary Provider:  None  CC:  HTN and Cardiomyopathy.  History of Present Illness: The patient presents for followup after recent hospitalization for hypertensive urgency and heart failure. He was treated with IV medications and his blood pressure resolved. He actually had no significant change in his oral therapy. Since going home he says he has done well. His blood pressure is actually much better controlled today than usual. This is despite the fact that he is out of some of his medications. He is not having any chest pressure, neck or arm discomfort. He is not having any new shortness of breath, PND or orthopnea. He is having no palpitations, presyncope or syncope.  Current Medications (verified): 1)  Furosemide 40 Mg Tabs (Furosemide) .Marland Kitchen.. 1 By Mouth Two Times A Day--Out 2)  Coreg 25 Mg Tabs (Carvedilol) .Marland Kitchen.. 1 By Mouth Two Times A Day 3)  Hydralazine Hcl 100 Mg Tabs (Hydralazine Hcl) .Marland Kitchen.. 1 Tabler Three Times A Day 4)  Clonidine Hcl 0.2 Mg Tabs (Clonidine Hcl) .Marland Kitchen.. 1 By Mouth Three Times A Day--Out 5)  Aspirin 325 Mg  Tabs (Aspirin) .Marland Kitchen.. 1 By Mouth Daily 6)  Nitrostat 0.4 Mg Subl (Nitroglycerin) .... As Needed 7)  Ferrous Sulfate 325 (65 Fe) Mg Tabs (Ferrous Sulfate) .Marland Kitchen.. 1 By Mouth Two Times A Day 8)  Simvastatin 20 Mg Tabs (Simvastatin) .... Out 9)  Isosorbide Mononitrate Cr 120 Mg Xr24h-Tab (Isosorbide Mononitrate) .... Out  Allergies (verified): No Known Drug Allergies  Past History:  Past Medical History: Reviewed history from 01/04/2010 and no changes required. 1. Coronary artery disease.       History of non-ST-elevation myocardial infarction, April        2008, in the setting of profound  epistaxis and anemia.  Cardiac        catheterization at that time demonstrated a 50% mid left        circumflex lesion.  His RCA was severely diseased with 80%        proximal disease in 3 separate locations, 90% mid disease with        ulcerated plaque, RV marginal totally occluded, and the mid RCA        was totally occluded with left-to-right collaterals to the distal        RCA.  Medical therapy was pursued at that time.   2. Echocardiogram, August 2009, EF 25%, apical/posterior akinesis,       inferior akinesis, moderate LVH, mild MR, mild decreased RV       function.   3. Hypertension.   4. Hyperlipidemia.   5. Chronic kidney disease with average creatinine of about 2.3.   6. Chronic systolic congestive heart failure.   7. Ischemic cardiomyopathy.   8. Anemia.   Past Surgical History: Reviewed history from 11/22/2008 and no changes required. Cardiac catheterization  Review of Systems       As stated in the HPI and negative for all other systems.   Vital Signs:  Patient profile:   45 year old male Height:      70 inches Weight:      190 pounds BMI:     27.36 Pulse rate:   66 / minute Resp:  18 per minute BP sitting:   148 / 82  (right arm)  Vitals Entered By: Marrion Coy, CNA (March 08, 2010 10:46 AM)  Physical Exam  General:  Well developed, well nourished, in no acute distress. Head:  normocephalic and atraumatic Eyes:  PERRLA/EOM intact; conjunctiva and lids normal. Mouth:   Oral mucosa normal. Neck:  Neck supple, no JVD. No masses, thyromegaly or abnormal cervical nodes. Chest Wall:  no deformities or breast masses noted Lungs:  Clear bilaterally to auscultation and percussion. Abdomen:  Bowel sounds positive; abdomen soft and non-tender without masses, organomegaly, or hernias noted. No hepatosplenomegaly. Msk:  Back normal, normal gait. Muscle strength and tone normal. Extremities:  No clubbing or cyanosis. Neurologic:  Alert and oriented x 3. Skin:   Intact without lesions or rashes. Cervical Nodes:  no significant adenopathy Inguinal Nodes:  no significant adenopathy Psych:  Normal affect.   Detailed Cardiovascular Exam  Neck    Carotids: Carotids full and equal bilaterally without bruits.      Neck Veins: Normal, no JVD.    Heart    Inspection: no deformities or lifts noted.      Palpation: normal PMI with no thrills palpable.      Auscultation: regular rate and rhythm, S1, S2 without murmurs, rubs, gallops, or clicks.    Vascular    Abdominal Aorta: no palpable masses, pulsations, or audible bruits.      Femoral Pulses: normal femoral pulses bilaterally.      Pedal Pulses: pulses normal in all 4 extremities    Radial Pulses: normal radial pulses bilaterally.      Peripheral Circulation: no clubbing, cyanosis, or edema noted with normal capillary refill.     EKG  Procedure date:  03/08/2010  Findings:      Sinus rhythm, left ventricular hypertrophy, right axis deviation, premature ventricular contractions  Impression & Recommendations:  Problem # 1:  CHRONIC SYSTOLIC HEART FAILURE (ICD-428.22) He seems to be euvolemic today. I will not change his medical regimen. We will give him a couple of clonidine pills because he is not going to be able to get a refill until tomorrow. He will remain on the other meds as listed. Of note he has demonstrated compliance for the past several months. I think he would be a reasonable candidate to consider an ICD now. I will discuss this with him. Orders: EKG w/ Interpretation (93000)  Problem # 2:  HYPERTENSION (ICD-401.9) His blood pressure is well controlled on this regimen when he takes it. Therefore, I will make no change but will help him get his medicines as described above.  Problem # 3:  CAD (ICD-414.00) He is having no angina. No further testing is suggested. Orders: EKG w/ Interpretation (93000)  Patient Instructions: 1)  Your physician recommends that you schedule a  follow-up appointment in: 2 mo 2)  Your physician recommends that you continue on your current medications as directed. Please refer to the Current Medication list given to you today. 3)  You have been diagnosed with Congestive Heart Failure or CHF.  CHF is a condition in which a problem with the structure or function of the heart impairs its ability to supply sufficient blood flow to meet the body's needs.  For further information please visit www.cardiosmart.org for detailed information on CHF. 4)  Your physician recommends that you weigh, daily, at the same time every day, and in the same amount of clothing.  Please record your daily weights on the handout provided  and bring it to your next appointment.

## 2010-10-03 NOTE — Miscellaneous (Signed)
  Clinical Lists Changes  Observations: Added new observation of DOPPLER LEG:   No evidence of deep vein or superficial thrombosis involving the   right lower extremity and left lower extremity. (02/22/2010 9:45) Added new observation of RESULTS MISC:  The the patient was unable to hold breath and very   claustrophobic therefore only the axial T1-weighted inversion   recovery images were obtained.  The patient refused further   imaging.    The ascending, transverse, and descending thoracic aorta appear   normal in caliber.  The great vessels grossly normal.  The heart is   enlarged.  No evidence of pericardial fluid.  No evidence of   pleural fluid.    IMPRESSION:    1. No gross evidence of aortic dissection in limited exam due to   patient claustrophobia.   2.  Cardiomegaly without evidence of pericardial effusion.  (02/22/2010 9:45) Added new observation of RESULTS MISC:  Findings: Previous day's chest radiograph showed cardiomegaly and   new right middle lobe atelectasis/infiltrate.  Ventilation imaging   with 10. Xe-133 inhaled shows homogeneous distribution   throughout both lungs with mild retention in the lung bases on the   washout images. Perfusion imaging with 6. Tc43m MAA IV shows a   somewhat heterogeneous distribution throughout the lungs with no   discrete segmental or subsegmental perfusion defects.    IMPRESSION   1.  Low likelihood ratio for pulmonary embolism. (02/22/2010 9:44)      MISC. Report  Procedure date:  02/22/2010  Findings:       Findings: Previous day's chest radiograph showed cardiomegaly and   new right middle lobe atelectasis/infiltrate.  Ventilation imaging   with 10. Xe-133 inhaled shows homogeneous distribution   throughout both lungs with mild retention in the lung bases on the   washout images. Perfusion imaging with 6. Tc26m MAA IV shows a   somewhat heterogeneous distribution throughout the lungs with no   discrete  segmental or subsegmental perfusion defects.    IMPRESSION   1.  Low likelihood ratio for pulmonary embolism.  MISC. Report  Procedure date:  02/22/2010  Findings:       The the patient was unable to hold breath and very   claustrophobic therefore only the axial T1-weighted inversion   recovery images were obtained.  The patient refused further   imaging.    The ascending, transverse, and descending thoracic aorta appear   normal in caliber.  The great vessels grossly normal.  The heart is   enlarged.  No evidence of pericardial fluid.  No evidence of   pleural fluid.    IMPRESSION:    1. No gross evidence of aortic dissection in limited exam due to   patient claustrophobia.   2.  Cardiomegaly without evidence of pericardial effusion.   Venous Doppler  Procedure date:  02/22/2010  Findings:        No evidence of deep vein or superficial thrombosis involving the   right lower extremity and left lower extremity.

## 2010-10-03 NOTE — Miscellaneous (Signed)
Summary: rx for pravastatin,furosemide,carvedilol rtc  Clinical Lists Changes  Medications: Changed medication from COREG 12.5 MG TABS (CARVEDILOL) 1 by mouth two times a day to CARVEDILOL 25 MG TABS (CARVEDILOL) ine twice a day - Signed Changed medication from HYDRALAZINE HCL 100 MG TABS (HYDRALAZINE HCL) 1 tabler three times a day to HYDRALAZINE HCL 50 MG TABS (HYDRALAZINE HCL) one three times a day - Signed Removed medication of SIMVASTATIN 20 MG TABS (SIMVASTATIN) OUT Removed medication of ISOSORBIDE MONONITRATE CR 120 MG XR24H-TAB (ISOSORBIDE MONONITRATE) out Removed medication of CLONIDINE HCL 0.2 MG TABS (CLONIDINE HCL) 1 by mouth three times a day--OUT Added new medication of PRAVASTATIN SODIUM 40 MG TABS (PRAVASTATIN SODIUM) one every evening - Signed Added new medication of ISOSORBIDE MONONITRATE CR 60 MG XR24H-TAB (ISOSORBIDE MONONITRATE) one daily - Signed Added new medication of SPIRONOLACTONE 25 MG TABS (SPIRONOLACTONE) 1/2 tablet daily - Signed Added new medication of AMLODIPINE BESYLATE 10 MG TABS (AMLODIPINE BESYLATE) one daily - Signed Rx of FUROSEMIDE 40 MG TABS (FUROSEMIDE) 1 by mouth two times a day--OUT;  #180 x 3;  Signed;  Entered by: Charolotte Capuchin, RN;  Authorized by: Rollene Rotunda, MD, [facility];  Method used: Faxed to [facility], [address], [place], [place]  [address], Ph: [phone], Fax: [phone] Rx of CARVEDILOL 25 MG TABS (CARVEDILOL) ine twice a day;  #180 x 3;  Signed;  Entered by: Charolotte Capuchin, RN;  Authorized by: Rollene Rotunda, MD, [facility];  Method used: Faxed to [facility], [address] [place], [place]  [address], Ph: [phone], Fax: [phone] Rx of HYDRALAZINE HCL 50 MG TABS (HYDRALAZINE HCL) one three times a day;  #270 x 3;  Signed;  Entered by: Charolotte Capuchin, RN;  Authorized by: Rollene Rotunda, MD, [facility];  Method used: Faxed to [facility], [address], [place], [place]  [address], Ph: [phone], Fax:  [phone] Rx of PRAVASTATIN SODIUM 40 MG TABS (PRAVASTATIN SODIUM) one every evening;  #90 x 3;  Signed;  Entered by: Charolotte Capuchin, RN;  Authorized by: Rollene Rotunda, MD, [facility];  Method used: Faxed to [facility] [employer], [address] [place], [place]  [address], Ph: [phone], Fax: [phone] Rx of ISOSORBIDE MONONITRATE CR 60 MG XR24H-TAB (ISOSORBIDE MONONITRATE) one daily;  #90 x 3;  Signed;  Entered by: Charolotte Capuchin, RN;  Authorized by: Rollene Rotunda, MD, [facility];  Method used: Faxed to [facility] [employer], [address], [place], [place]  [address], Ph: [phone], Fax: [phone] Rx of SPIRONOLACTONE 25 MG TABS (SPIRONOLACTONE) 1/2 tablet daily;  #90 x 3;  Signed;  Entered by: Charolotte Capuchin, RN;  Authorized by: Rollene Rotunda, MD, [facility];  Method used: Faxed to [facility], [address] [place], [place]  [address], Ph: [phone], Fax: [phone] Rx of AMLODIPINE BESYLATE 10 MG TABS (AMLODIPINE BESYLATE) one daily;  #90 x 3;  Signed;  Entered by: Charolotte Capuchin, RN;  Authorized by: Rollene Rotunda, MD, [facility];  Method used: Faxed to [facility] [employer], [address], [place], [place]  [address], Ph: [phone], Fax: [phone]    Prescriptions: AMLODIPINE BESYLATE 10 MG TABS (AMLODIPINE BESYLATE) one daily  #90 x 3   Entered by:   Charolotte Capuchin, RN   Authorized by:   Rollene Rotunda, MD, [facility]   Signed by:   Charolotte Capuchin, RN on 05/03/2010   Method used:   Faxed to ...       [facility] [employer] (mail-order)       [id number]       [place], [place]  [address]       Ph: [phone]       Fax: [phone]   RxID:   [id number] SPIRONOLACTONE 25 MG TABS (SPIRONOLACTONE) 1/2 tablet daily  #90 x 3   Entered by:   Charolotte Capuchin, RN   Authorized by:   Rollene Rotunda, MD, [facility]   Signed by:   Charolotte Capuchin, RN on 05/03/2010   Method used:   Faxed to ...       [facility] [employer] (mail-order)       [address] [place], [place]  [address]       Ph: [phone]       Fax: [phone]   RxID:   [id number] ISOSORBIDE MONONITRATE CR 60 MG XR24H-TAB (ISOSORBIDE MONONITRATE) one daily  #90 x 3   Entered by:   Charolotte Capuchin, RN   Authorized by:   Rollene Rotunda, MD, [facility]   Signed by:   Charolotte Capuchin, RN on 05/03/2010   Method used:   Faxed to ...       [facility] [employer] (mail-order)       [address] [place], [place]  [address]       Ph: [phone]       Fax: [phone]   RxID:   [id number] PRAVASTATIN SODIUM 40 MG TABS (PRAVASTATIN SODIUM) one every evening  #90 x 3   Entered by:   Charolotte Capuchin, RN   Authorized by:   Rollene Rotunda, MD, [facility]   Signed by:   Charolotte Capuchin, RN on 05/03/2010   Method used:   Faxed to ...       [facility] [employer] (mail-order)       [address] [place], [place]  [address]       Ph: [phone]       Fax: [phone]   RxID:   [id number] HYDRALAZINE HCL 50 MG TABS (HYDRALAZINE HCL) one three times a day  #270 x 3   Entered by:   Charolotte Capuchin, RN   Authorized by:   Rollene Rotunda, MD, [facility]   Signed by:   Charolotte Capuchin, RN on 05/03/2010   Method used:   Faxed to ...       [facility] [employer] (mail-order)       [address] [place], [place]  [address]       Ph: [phone]       Fax: [phone]   RxID:   [id number] CARVEDILOL 25 MG TABS (CARVEDILOL) ine twice a day  #180 x 3   Entered by:   Charolotte Capuchin, RN   Authorized by:   Rollene Rotunda, MD, [facility]   Signed by:   Charolotte Capuchin, RN on 05/03/2010   Method used:   Faxed to ...       [facility] [employer] (mail-order)       [address] [place], [place]  [address]       Ph: [phone]       Fax: [phone]   RxID:   [id number] FUROSEMIDE 40 MG TABS (FUROSEMIDE) 1 by mouth two times a day--OUT  #180 x 3   Entered by:   Charolotte Capuchin, RN   Authorized by:   Rollene Rotunda, MD, [facility]   Signed by:  Charolotte Capuchin, RN on 05/03/2010   Method used:   Faxed to ...       [facility] [employer] [facility] (mail-order)       [address] [place], [place]  [address]       Ph: [phone]       Fax: [phone]   RxID:   [id number]

## 2010-11-17 LAB — BASIC METABOLIC PANEL
BUN: 36 mg/dL — ABNORMAL HIGH (ref 6–23)
CO2: 27 mEq/L (ref 19–32)
Calcium: 8.8 mg/dL (ref 8.4–10.5)
Calcium: 8.9 mg/dL (ref 8.4–10.5)
Calcium: 8.9 mg/dL (ref 8.4–10.5)
Calcium: 9.2 mg/dL (ref 8.4–10.5)
Chloride: 100 mEq/L (ref 96–112)
Creatinine, Ser: 2.39 mg/dL — ABNORMAL HIGH (ref 0.4–1.5)
Creatinine, Ser: 2.43 mg/dL — ABNORMAL HIGH (ref 0.4–1.5)
Creatinine, Ser: 2.51 mg/dL — ABNORMAL HIGH (ref 0.4–1.5)
Creatinine, Ser: 2.77 mg/dL — ABNORMAL HIGH (ref 0.4–1.5)
GFR calc Af Amer: 30 mL/min — ABNORMAL LOW (ref >60)
GFR calc Af Amer: 34 mL/min — ABNORMAL LOW (ref >60)
GFR calc Af Amer: 35 mL/min — ABNORMAL LOW (ref >60)
GFR calc Af Amer: 36 mL/min — ABNORMAL LOW (ref >60)
GFR calc non Af Amer: 28 mL/min — ABNORMAL LOW (ref >60)
GFR calc non Af Amer: 30 mL/min — ABNORMAL LOW (ref >60)
Sodium: 136 mEq/L (ref 135–145)

## 2010-11-17 LAB — POCT I-STAT, CHEM 8
Calcium, Ion: 1.09 mmol/L — ABNORMAL LOW (ref 1.12–1.32)
HCT: 39 % (ref 39.0–52.0)
Hemoglobin: 13.3 g/dL (ref 13.0–17.0)
Sodium: 141 mEq/L (ref 135–145)
TCO2: 25 mmol/L (ref 0–100)

## 2010-11-17 LAB — URINE DRUGS OF ABUSE SCREEN W ALC, ROUTINE (REF LAB)
Benzodiazepines.: NEGATIVE
Cocaine Metabolites: NEGATIVE
Marijuana Metabolite: POSITIVE — AB
Methadone: NEGATIVE
Phencyclidine (PCP): NEGATIVE

## 2010-11-17 LAB — CBC
MCH: 29.1 pg (ref 26.0–34.0)
MCHC: 33.2 g/dL (ref 30.0–36.0)
MCV: 88.1 fL (ref 78.0–100.0)
Platelets: 220 10*3/uL (ref 150–400)
Platelets: 273 10*3/uL (ref 150–400)
RBC: 4.27 MIL/uL (ref 4.22–5.81)
RBC: 4.99 MIL/uL (ref 4.22–5.81)
RDW: 14.4 % (ref 11.5–15.5)
RDW: 14.6 % (ref 11.5–15.5)
WBC: 9.2 10*3/uL (ref 4.0–10.5)

## 2010-11-17 LAB — CARDIAC PANEL(CRET KIN+CKTOT+MB+TROPI)
CK, MB: 1.4 ng/mL (ref 0.3–4.0)
Relative Index: INVALID (ref 0.0–2.5)
Troponin I: 0.06 ng/mL (ref 0.00–0.06)

## 2010-11-17 LAB — DIFFERENTIAL
Basophils Absolute: 0.1 10*3/uL (ref 0.0–0.1)
Eosinophils Relative: 6 % — ABNORMAL HIGH (ref 0–5)
Lymphs Abs: 1.9 10*3/uL (ref 0.7–4.0)
Monocytes Relative: 7 % (ref 3–12)
Neutrophils Relative %: 65 % (ref 43–77)

## 2010-11-17 LAB — COMPREHENSIVE METABOLIC PANEL
ALT: 12 U/L (ref 0–53)
AST: 17 U/L (ref 0–37)
Albumin: 3.4 g/dL — ABNORMAL LOW (ref 3.5–5.2)
Alkaline Phosphatase: 73 U/L (ref 39–117)
Potassium: 3.6 mEq/L (ref 3.5–5.1)
Sodium: 139 mEq/L (ref 135–145)
Total Protein: 7.3 g/dL (ref 6.0–8.3)

## 2010-11-17 LAB — CK TOTAL AND CKMB (NOT AT ARMC)
CK, MB: 1 ng/mL (ref 0.3–4.0)
Total CK: 90 U/L (ref 7–232)

## 2010-11-17 LAB — PROTIME-INR: Prothrombin Time: 15.4 seconds — ABNORMAL HIGH (ref 11.6–15.2)

## 2010-11-17 LAB — THC (MARIJUANA), URINE, CONFIRMATION: Marijuana, Ur-Confirmation: 34 NG/ML — ABNORMAL HIGH

## 2010-11-17 LAB — POCT CARDIAC MARKERS: Myoglobin, poc: 56 ng/mL (ref 12–200)

## 2010-11-18 LAB — CARDIAC PANEL(CRET KIN+CKTOT+MB+TROPI)
CK, MB: 1.1 ng/mL (ref 0.3–4.0)
CK, MB: 1.2 ng/mL (ref 0.3–4.0)
Total CK: 87 U/L (ref 7–232)
Troponin I: 0.04 ng/mL (ref 0.00–0.06)
Troponin I: 0.04 ng/mL (ref 0.00–0.06)

## 2010-11-18 LAB — CBC
Hemoglobin: 12 g/dL — ABNORMAL LOW (ref 13.0–17.0)
MCH: 29.7 pg (ref 26.0–34.0)
MCHC: 32.5 g/dL (ref 30.0–36.0)
MCHC: 32.9 g/dL (ref 30.0–36.0)
MCV: 90.5 fL (ref 78.0–100.0)
Platelets: 200 10*3/uL (ref 150–400)
Platelets: 230 10*3/uL (ref 150–400)
RBC: 3.66 MIL/uL — ABNORMAL LOW (ref 4.22–5.81)
RBC: 4.56 MIL/uL (ref 4.22–5.81)
RDW: 16.4 % — ABNORMAL HIGH (ref 11.5–15.5)
RDW: 16.5 % — ABNORMAL HIGH (ref 11.5–15.5)
RDW: 16.6 % — ABNORMAL HIGH (ref 11.5–15.5)
WBC: 6.7 10*3/uL (ref 4.0–10.5)
WBC: 6.7 10*3/uL (ref 4.0–10.5)
WBC: 7 10*3/uL (ref 4.0–10.5)

## 2010-11-18 LAB — DIFFERENTIAL
Basophils Absolute: 0 10*3/uL (ref 0.0–0.1)
Basophils Relative: 1 % (ref 0–1)
Eosinophils Absolute: 0.6 10*3/uL (ref 0.0–0.7)
Eosinophils Relative: 9 % — ABNORMAL HIGH (ref 0–5)
Lymphocytes Relative: 17 % (ref 12–46)
Lymphs Abs: 1.2 10*3/uL (ref 0.7–4.0)
Lymphs Abs: 1.3 10*3/uL (ref 0.7–4.0)
Monocytes Absolute: 0.3 10*3/uL (ref 0.1–1.0)
Monocytes Relative: 5 % (ref 3–12)
Neutrophils Relative %: 71 % (ref 43–77)

## 2010-11-18 LAB — COMPREHENSIVE METABOLIC PANEL
ALT: 10 U/L (ref 0–53)
AST: 15 U/L (ref 0–37)
Albumin: 2.6 g/dL — ABNORMAL LOW (ref 3.5–5.2)
Alkaline Phosphatase: 50 U/L (ref 39–117)
Alkaline Phosphatase: 51 U/L (ref 39–117)
BUN: 18 mg/dL (ref 6–23)
Calcium: 8.4 mg/dL (ref 8.4–10.5)
Chloride: 105 mEq/L (ref 96–112)
GFR calc Af Amer: 42 mL/min — ABNORMAL LOW (ref >60)
GFR calc non Af Amer: 33 mL/min — ABNORMAL LOW (ref >60)
Glucose, Bld: 85 mg/dL (ref 70–99)
Potassium: 3.5 mEq/L (ref 3.5–5.1)
Potassium: 3.6 mEq/L (ref 3.5–5.1)
Sodium: 141 mEq/L (ref 135–145)
Total Bilirubin: 1.5 mg/dL — ABNORMAL HIGH (ref 0.3–1.2)
Total Protein: 5.9 g/dL — ABNORMAL LOW (ref 6.0–8.3)

## 2010-11-18 LAB — BASIC METABOLIC PANEL
BUN: 19 mg/dL (ref 6–23)
CO2: 29 mEq/L (ref 19–32)
Calcium: 8.6 mg/dL (ref 8.4–10.5)
Calcium: 9 mg/dL (ref 8.4–10.5)
Chloride: 100 mEq/L (ref 96–112)
Creatinine, Ser: 2.13 mg/dL — ABNORMAL HIGH (ref 0.4–1.5)
Creatinine, Ser: 2.24 mg/dL — ABNORMAL HIGH (ref 0.4–1.5)
GFR calc Af Amer: 39 mL/min — ABNORMAL LOW (ref >60)
GFR calc non Af Amer: 33 mL/min — ABNORMAL LOW (ref >60)
Glucose, Bld: 118 mg/dL — ABNORMAL HIGH (ref 70–99)
Potassium: 3.7 mEq/L (ref 3.5–5.1)
Sodium: 139 mEq/L (ref 135–145)
Sodium: 139 mEq/L (ref 135–145)
Sodium: 140 mEq/L (ref 135–145)

## 2010-11-18 LAB — RAPID URINE DRUG SCREEN, HOSP PERFORMED
Cocaine: NOT DETECTED
Tetrahydrocannabinol: POSITIVE — AB

## 2010-11-18 LAB — POCT CARDIAC MARKERS
Myoglobin, poc: 83 ng/mL (ref 12–200)
Troponin i, poc: <0.05 ng/mL (ref 0.00–0.09)
Troponin i, poc: <0.05 ng/mL (ref 0.00–0.09)

## 2010-11-18 LAB — CK TOTAL AND CKMB (NOT AT ARMC): Relative Index: INVALID (ref 0.0–2.5)

## 2010-11-18 LAB — POCT I-STAT, CHEM 8
BUN: 20 mg/dL (ref 6–23)
Calcium, Ion: 1.07 mmol/L — ABNORMAL LOW (ref 1.12–1.32)
Chloride: 108 mEq/L (ref 96–112)
Creatinine, Ser: 2.3 mg/dL — ABNORMAL HIGH (ref 0.4–1.5)
Sodium: 142 mEq/L (ref 135–145)
TCO2: 24 mmol/L (ref 0–100)

## 2010-11-18 LAB — MRSA PCR SCREENING: MRSA by PCR: NEGATIVE

## 2010-11-18 LAB — BRAIN NATRIURETIC PEPTIDE
Pro B Natriuretic peptide (BNP): 2542 pg/mL — ABNORMAL HIGH (ref 0.0–100.0)
Pro B Natriuretic peptide (BNP): 864 pg/mL — ABNORMAL HIGH (ref 0.0–100.0)
Pro B Natriuretic peptide (BNP): >3200 pg/mL — ABNORMAL HIGH (ref 0.0–100.0)

## 2010-11-18 LAB — ACETAMINOPHEN LEVEL: Acetaminophen (Tylenol), Serum: <10 ug/mL — ABNORMAL LOW (ref 10–30)

## 2010-11-18 LAB — TROPONIN I: Troponin I: 0.05 ng/mL (ref 0.00–0.06)

## 2010-11-19 LAB — CBC
HCT: 37.7 % — ABNORMAL LOW (ref 39.0–52.0)
HCT: 34.9 % — ABNORMAL LOW (ref 39.0–52.0)
HCT: 43 % (ref 39.0–52.0)
HCT: 43.3 % (ref 39.0–52.0)
Hemoglobin: 11.7 g/dL — ABNORMAL LOW (ref 13.0–17.0)
Hemoglobin: 11.7 g/dL — ABNORMAL LOW (ref 13.0–17.0)
Hemoglobin: 12.6 g/dL — ABNORMAL LOW (ref 13.0–17.0)
Hemoglobin: 14.4 g/dL (ref 13.0–17.0)
Hemoglobin: 14.5 g/dL (ref 13.0–17.0)
MCHC: 33.8 g/dL (ref 30.0–36.0)
MCHC: 34 g/dL (ref 30.0–36.0)
MCHC: 33.5 g/dL (ref 30.0–36.0)
MCV: 83.2 fL (ref 78.0–100.0)
MCV: 90.3 fL (ref 78.0–100.0)
Platelets: 158 10*3/uL (ref 150–400)
Platelets: 187 10*3/uL (ref 150–400)
Platelets: 211 10*3/uL (ref 150–400)
RBC: 4.16 MIL/uL — ABNORMAL LOW (ref 4.22–5.81)
RBC: 4.5 MIL/uL (ref 4.22–5.81)
RBC: 4.11 MIL/uL — ABNORMAL LOW (ref 4.22–5.81)
RBC: 4.32 MIL/uL (ref 4.22–5.81)
RBC: 4.77 MIL/uL (ref 4.22–5.81)
RBC: 4.79 MIL/uL (ref 4.22–5.81)
RDW: 18.2 % — ABNORMAL HIGH (ref 11.5–15.5)
RDW: 18.3 % — ABNORMAL HIGH (ref 11.5–15.5)
RDW: 16 % — ABNORMAL HIGH (ref 11.5–15.5)
RDW: 16.2 % — ABNORMAL HIGH (ref 11.5–15.5)
WBC: 9.4 10*3/uL (ref 4.0–10.5)
WBC: 6.5 10*3/uL (ref 4.0–10.5)
WBC: 6.5 10*3/uL (ref 4.0–10.5)

## 2010-11-19 LAB — BASIC METABOLIC PANEL
BUN: 32 mg/dL — ABNORMAL HIGH (ref 6–23)
BUN: 25 mg/dL — ABNORMAL HIGH (ref 6–23)
CO2: 29 mEq/L (ref 19–32)
CO2: 32 mEq/L (ref 19–32)
CO2: 20 mEq/L (ref 19–32)
CO2: 26 mEq/L (ref 19–32)
CO2: 31 mEq/L (ref 19–32)
Calcium: 8 mg/dL — ABNORMAL LOW (ref 8.4–10.5)
Calcium: 8.5 mg/dL (ref 8.4–10.5)
Calcium: 7.8 mg/dL — ABNORMAL LOW (ref 8.4–10.5)
Calcium: 8.3 mg/dL — ABNORMAL LOW (ref 8.4–10.5)
Calcium: 8.3 mg/dL — ABNORMAL LOW (ref 8.4–10.5)
Calcium: 8.4 mg/dL (ref 8.4–10.5)
Chloride: 100 mEq/L (ref 96–112)
Creatinine, Ser: 2.48 mg/dL — ABNORMAL HIGH (ref 0.4–1.5)
Creatinine, Ser: 2.52 mg/dL — ABNORMAL HIGH (ref 0.4–1.5)
Creatinine, Ser: 2.31 mg/dL — ABNORMAL HIGH (ref 0.4–1.5)
Creatinine, Ser: 2.52 mg/dL — ABNORMAL HIGH (ref 0.4–1.5)
GFR calc Af Amer: 34 mL/min — ABNORMAL LOW (ref >60)
GFR calc Af Amer: 31 mL/min — ABNORMAL LOW (ref >60)
GFR calc Af Amer: 34 mL/min — ABNORMAL LOW (ref >60)
GFR calc Af Amer: 37 mL/min — ABNORMAL LOW (ref >60)
GFR calc Af Amer: 38 mL/min — ABNORMAL LOW (ref >60)
GFR calc Af Amer: 41 mL/min — ABNORMAL LOW (ref >60)
GFR calc non Af Amer: 28 mL/min — ABNORMAL LOW (ref >60)
GFR calc non Af Amer: 29 mL/min — ABNORMAL LOW (ref >60)
GFR calc non Af Amer: 28 mL/min — ABNORMAL LOW (ref >60)
GFR calc non Af Amer: 34 mL/min — ABNORMAL LOW (ref >60)
Glucose, Bld: 134 mg/dL — ABNORMAL HIGH (ref 70–99)
Glucose, Bld: 90 mg/dL (ref 70–99)
Glucose, Bld: 99 mg/dL (ref 70–99)
Potassium: 3.9 mEq/L (ref 3.5–5.1)
Potassium: 4 mEq/L (ref 3.5–5.1)
Sodium: 134 mEq/L — ABNORMAL LOW (ref 135–145)
Sodium: 136 mEq/L (ref 135–145)
Sodium: 140 mEq/L (ref 135–145)
Sodium: 140 mEq/L (ref 135–145)

## 2010-11-19 LAB — LIPID PANEL
Cholesterol: 121 mg/dL (ref 0–200)
HDL: 31 mg/dL — ABNORMAL LOW (ref >39)
LDL Cholesterol: 78 mg/dL (ref 0–99)
Total CHOL/HDL Ratio: 3.9 RATIO

## 2010-11-19 LAB — COMPREHENSIVE METABOLIC PANEL
ALT: 11 U/L (ref 0–53)
ALT: 14 U/L (ref 0–53)
Albumin: 3.3 g/dL — ABNORMAL LOW (ref 3.5–5.2)
Alkaline Phosphatase: 65 U/L (ref 39–117)
BUN: 19 mg/dL (ref 6–23)
BUN: 21 mg/dL (ref 6–23)
CO2: 29 mEq/L (ref 19–32)
Calcium: 8.1 mg/dL — ABNORMAL LOW (ref 8.4–10.5)
Calcium: 8.2 mg/dL — ABNORMAL LOW (ref 8.4–10.5)
Chloride: 100 mEq/L (ref 96–112)
Creatinine, Ser: 2.34 mg/dL — ABNORMAL HIGH (ref 0.4–1.5)
GFR calc non Af Amer: 31 mL/min — ABNORMAL LOW (ref >60)
Glucose, Bld: 109 mg/dL — ABNORMAL HIGH (ref 70–99)
Glucose, Bld: 149 mg/dL — ABNORMAL HIGH (ref 70–99)
Glucose, Bld: 100 mg/dL — ABNORMAL HIGH (ref 70–99)
Potassium: 3.8 mEq/L (ref 3.5–5.1)
Sodium: 137 mEq/L (ref 135–145)
Sodium: 139 mEq/L (ref 135–145)
Total Bilirubin: 1.7 mg/dL — ABNORMAL HIGH (ref 0.3–1.2)
Total Protein: 7 g/dL (ref 6.0–8.3)
Total Protein: 6.2 g/dL (ref 6.0–8.3)

## 2010-11-19 LAB — DIFFERENTIAL
Basophils Absolute: 0 10*3/uL (ref 0.0–0.1)
Basophils Absolute: 0 10*3/uL (ref 0.0–0.1)
Basophils Relative: 0 % (ref 0–1)
Basophils Relative: 1 % (ref 0–1)
Lymphocytes Relative: 9 % — ABNORMAL LOW (ref 12–46)
Monocytes Absolute: 0.4 10*3/uL (ref 0.1–1.0)
Neutro Abs: 8.2 10*3/uL — ABNORMAL HIGH (ref 1.7–7.7)
Neutro Abs: 5.2 10*3/uL (ref 1.7–7.7)
Neutrophils Relative %: 87 % — ABNORMAL HIGH (ref 43–77)
Neutrophils Relative %: 81 % — ABNORMAL HIGH (ref 43–77)

## 2010-11-19 LAB — CARDIAC PANEL(CRET KIN+CKTOT+MB+TROPI)
CK, MB: 1 ng/mL (ref 0.3–4.0)
CK, MB: 1.7 ng/mL (ref 0.3–4.0)
Relative Index: 1.7 (ref 0.0–2.5)
Relative Index: INVALID (ref 0.0–2.5)
Total CK: 102 U/L (ref 7–232)
Total CK: 80 U/L (ref 7–232)
Troponin I: 0.06 ng/mL (ref 0.00–0.06)

## 2010-11-19 LAB — URINALYSIS, ROUTINE W REFLEX MICROSCOPIC
Glucose, UA: NEGATIVE mg/dL
Ketones, ur: NEGATIVE mg/dL
Leukocytes, UA: NEGATIVE
Specific Gravity, Urine: 1.012 (ref 1.005–1.030)
pH: 6 (ref 5.0–8.0)

## 2010-11-19 LAB — CSF CELL COUNT WITH DIFFERENTIAL: WBC, CSF: 7 /mm3 — ABNORMAL HIGH (ref 0–5)

## 2010-11-19 LAB — RAPID URINE DRUG SCREEN, HOSP PERFORMED
Amphetamines: NOT DETECTED
Benzodiazepines: NOT DETECTED
Opiates: NOT DETECTED
Tetrahydrocannabinol: POSITIVE — AB

## 2010-11-19 LAB — CULTURE, BLOOD (ROUTINE X 2)
Culture: NO GROWTH
Culture: NO GROWTH
Culture: NO GROWTH

## 2010-11-19 LAB — MAGNESIUM
Magnesium: 1.7 mg/dL (ref 1.5–2.5)
Magnesium: 2 mg/dL (ref 1.5–2.5)

## 2010-11-19 LAB — BRAIN NATRIURETIC PEPTIDE: Pro B Natriuretic peptide (BNP): 1065 pg/mL — ABNORMAL HIGH (ref 0.0–100.0)

## 2010-11-19 LAB — URINE CULTURE

## 2010-11-19 LAB — PROTIME-INR
INR: 1.43 (ref 0.00–1.49)
Prothrombin Time: 17.3 seconds — ABNORMAL HIGH (ref 11.6–15.2)

## 2010-11-19 LAB — POCT CARDIAC MARKERS
CKMB, poc: 1.4 ng/mL (ref 1.0–8.0)
CKMB, poc: 1.8 ng/mL (ref 1.0–8.0)
Myoglobin, poc: 150 ng/mL (ref 12–200)
Myoglobin, poc: 181 ng/mL (ref 12–200)

## 2010-11-19 LAB — CSF CULTURE W GRAM STAIN: Culture: NO GROWTH

## 2010-11-19 LAB — GRAM STAIN

## 2010-11-19 LAB — APTT
aPTT: 37 seconds (ref 24–37)
aPTT: 32 seconds (ref 24–37)

## 2010-11-19 LAB — URINE MICROSCOPIC-ADD ON

## 2010-11-19 LAB — POTASSIUM: Potassium: 3.8 mEq/L (ref 3.5–5.1)

## 2010-11-19 LAB — MRSA PCR SCREENING: MRSA by PCR: NEGATIVE

## 2010-11-21 LAB — DIFFERENTIAL
Basophils Relative: 0 % (ref 0–1)
Eosinophils Absolute: 0.2 10*3/uL (ref 0.0–0.7)
Lymphocytes Relative: 10 % — ABNORMAL LOW (ref 12–46)
Lymphs Abs: 1.6 10*3/uL (ref 0.7–4.0)
Lymphs Abs: 1 10*3/uL (ref 0.7–4.0)
Monocytes Relative: 7 % (ref 3–12)
Monocytes Relative: 6 % (ref 3–12)
Neutro Abs: 3.2 10*3/uL (ref 1.7–7.7)
Neutro Abs: 9.1 10*3/uL — ABNORMAL HIGH (ref 1.7–7.7)
Neutrophils Relative %: 59 % (ref 43–77)
Neutrophils Relative %: 84 % — ABNORMAL HIGH (ref 43–77)

## 2010-11-21 LAB — POCT CARDIAC MARKERS
CKMB, poc: 1.1 ng/mL (ref 1.0–8.0)
CKMB, poc: <1 ng/mL — ABNORMAL LOW (ref 1.0–8.0)
Myoglobin, poc: 97 ng/mL (ref 12–200)
Troponin i, poc: <0.05 ng/mL (ref 0.00–0.09)

## 2010-11-21 LAB — CBC
HCT: 38.4 % — ABNORMAL LOW (ref 39.0–52.0)
Hemoglobin: 13.1 g/dL (ref 13.0–17.0)
MCHC: 34.1 g/dL (ref 30.0–36.0)
MCV: 88.5 fL (ref 78.0–100.0)
Platelets: 196 10*3/uL (ref 150–400)
Platelets: 243 10*3/uL (ref 150–400)
RBC: 4.14 MIL/uL — ABNORMAL LOW (ref 4.22–5.81)
RDW: 15.1 % (ref 11.5–15.5)
WBC: 5.5 10*3/uL (ref 4.0–10.5)

## 2010-11-21 LAB — BASIC METABOLIC PANEL
BUN: 22 mg/dL (ref 6–23)
Calcium: 7.8 mg/dL — ABNORMAL LOW (ref 8.4–10.5)
Chloride: 99 mEq/L (ref 96–112)
Creatinine, Ser: 2.32 mg/dL — ABNORMAL HIGH (ref 0.4–1.5)
GFR calc Af Amer: 38 mL/min — ABNORMAL LOW (ref >60)
GFR calc non Af Amer: 31 mL/min — ABNORMAL LOW (ref >60)

## 2010-11-21 LAB — CULTURE, BLOOD (ROUTINE X 2)

## 2010-11-21 LAB — URINALYSIS, ROUTINE W REFLEX MICROSCOPIC
Ketones, ur: NEGATIVE mg/dL
Leukocytes, UA: NEGATIVE
Nitrite: NEGATIVE
Protein, ur: 100 mg/dL — AB
pH: 6 (ref 5.0–8.0)

## 2010-11-21 LAB — RAPID URINE DRUG SCREEN, HOSP PERFORMED
Barbiturates: NOT DETECTED
Benzodiazepines: NOT DETECTED
Cocaine: NOT DETECTED
Opiates: POSITIVE — AB

## 2010-11-21 LAB — CARDIAC PANEL(CRET KIN+CKTOT+MB+TROPI)
CK, MB: 2.4 ng/mL (ref 0.3–4.0)
Relative Index: 2.3 (ref 0.0–2.5)
Relative Index: 2.5 (ref 0.0–2.5)
Relative Index: INVALID (ref 0.0–2.5)
Total CK: 121 U/L (ref 7–232)
Total CK: 98 U/L (ref 7–232)
Troponin I: 0.04 ng/mL (ref 0.00–0.06)
Troponin I: 0.05 ng/mL (ref 0.00–0.06)
Troponin I: 0.07 ng/mL — ABNORMAL HIGH (ref 0.00–0.06)

## 2010-11-21 LAB — URINE MICROSCOPIC-ADD ON

## 2010-11-21 LAB — COMPREHENSIVE METABOLIC PANEL
BUN: 21 mg/dL (ref 6–23)
Calcium: 8.3 mg/dL — ABNORMAL LOW (ref 8.4–10.5)
Creatinine, Ser: 2.22 mg/dL — ABNORMAL HIGH (ref 0.4–1.5)
Glucose, Bld: 103 mg/dL — ABNORMAL HIGH (ref 70–99)
Total Protein: 7 g/dL (ref 6.0–8.3)

## 2010-11-21 LAB — BRAIN NATRIURETIC PEPTIDE
Pro B Natriuretic peptide (BNP): 1410 pg/mL — ABNORMAL HIGH (ref 0.0–100.0)
Pro B Natriuretic peptide (BNP): >3200 pg/mL — ABNORMAL HIGH (ref 0.0–100.0)

## 2010-12-06 LAB — BRAIN NATRIURETIC PEPTIDE
Pro B Natriuretic peptide (BNP): >3200 pg/mL — ABNORMAL HIGH (ref 0.0–100.0)
Pro B Natriuretic peptide (BNP): >3200 pg/mL — ABNORMAL HIGH (ref 0.0–100.0)

## 2010-12-06 LAB — DIFFERENTIAL
Basophils Absolute: 0.1 10*3/uL (ref 0.0–0.1)
Lymphocytes Relative: 18 % (ref 12–46)
Monocytes Absolute: 0.3 10*3/uL (ref 0.1–1.0)
Neutro Abs: 6.8 10*3/uL (ref 1.7–7.7)

## 2010-12-06 LAB — CARDIAC PANEL(CRET KIN+CKTOT+MB+TROPI)
CK, MB: 5 ng/mL — ABNORMAL HIGH (ref 0.3–4.0)
Relative Index: 3.6 — ABNORMAL HIGH (ref 0.0–2.5)
Relative Index: 4.9 — ABNORMAL HIGH (ref 0.0–2.5)
Relative Index: INVALID (ref 0.0–2.5)
Troponin I: 0.47 ng/mL — ABNORMAL HIGH (ref 0.00–0.06)

## 2010-12-06 LAB — POCT I-STAT, CHEM 8
BUN: 29 mg/dL — ABNORMAL HIGH (ref 6–23)
Calcium, Ion: 1.06 mmol/L — ABNORMAL LOW (ref 1.12–1.32)
Chloride: 106 mEq/L (ref 96–112)

## 2010-12-06 LAB — CBC
Hemoglobin: 11.3 g/dL — ABNORMAL LOW (ref 13.0–17.0)
RBC: 3.94 MIL/uL — ABNORMAL LOW (ref 4.22–5.81)
RDW: 15 % (ref 11.5–15.5)
WBC: 9 10*3/uL (ref 4.0–10.5)

## 2010-12-06 LAB — BASIC METABOLIC PANEL
BUN: 25 mg/dL — ABNORMAL HIGH (ref 6–23)
CO2: 29 mEq/L (ref 19–32)
Calcium: 8.2 mg/dL — ABNORMAL LOW (ref 8.4–10.5)
Creatinine, Ser: 2.08 mg/dL — ABNORMAL HIGH (ref 0.4–1.5)
Creatinine, Ser: 2.1 mg/dL — ABNORMAL HIGH (ref 0.4–1.5)
GFR calc Af Amer: 43 mL/min — ABNORMAL LOW (ref >60)
GFR calc non Af Amer: 35 mL/min — ABNORMAL LOW (ref >60)

## 2010-12-06 LAB — CK TOTAL AND CKMB (NOT AT ARMC): Relative Index: INVALID (ref 0.0–2.5)

## 2010-12-06 LAB — POCT CARDIAC MARKERS
Myoglobin, poc: 91.9 ng/mL (ref 12–200)
Troponin i, poc: <0.05 ng/mL (ref 0.00–0.09)

## 2010-12-07 LAB — MAGNESIUM: Magnesium: 1.8 mg/dL (ref 1.5–2.5)

## 2010-12-07 LAB — BASIC METABOLIC PANEL
BUN: 24 mg/dL — ABNORMAL HIGH (ref 6–23)
BUN: 29 mg/dL — ABNORMAL HIGH (ref 6–23)
BUN: 29 mg/dL — ABNORMAL HIGH (ref 6–23)
BUN: 43 mg/dL — ABNORMAL HIGH (ref 6–23)
CO2: 27 mEq/L (ref 19–32)
Calcium: 7.7 mg/dL — ABNORMAL LOW (ref 8.4–10.5)
Calcium: 8.6 mg/dL (ref 8.4–10.5)
Calcium: 8.9 mg/dL (ref 8.4–10.5)
Chloride: 100 mEq/L (ref 96–112)
Chloride: 101 mEq/L (ref 96–112)
Chloride: 101 mEq/L (ref 96–112)
Creatinine, Ser: 2.24 mg/dL — ABNORMAL HIGH (ref 0.4–1.5)
GFR calc Af Amer: 39 mL/min — ABNORMAL LOW (ref >60)
GFR calc Af Amer: 42 mL/min — ABNORMAL LOW (ref >60)
GFR calc Af Amer: 46 mL/min — ABNORMAL LOW (ref >60)
GFR calc non Af Amer: 35 mL/min — ABNORMAL LOW (ref >60)
GFR calc non Af Amer: 38 mL/min — ABNORMAL LOW (ref >60)
GFR calc non Af Amer: 38 mL/min — ABNORMAL LOW (ref >60)
GFR calc non Af Amer: 30 mL/min — ABNORMAL LOW (ref >60)
Glucose, Bld: 80 mg/dL (ref 70–99)
Glucose, Bld: 98 mg/dL (ref 70–99)
Potassium: 3.7 mEq/L (ref 3.5–5.1)
Potassium: 3.8 mEq/L (ref 3.5–5.1)
Potassium: 4 mEq/L (ref 3.5–5.1)
Potassium: 4 mEq/L (ref 3.5–5.1)
Sodium: 137 mEq/L (ref 135–145)
Sodium: 138 mEq/L (ref 135–145)
Sodium: 139 mEq/L (ref 135–145)
Sodium: 141 mEq/L (ref 135–145)
Sodium: 136 mEq/L (ref 135–145)

## 2010-12-07 LAB — POCT I-STAT, CHEM 8
BUN: 33 mg/dL — ABNORMAL HIGH (ref 6–23)
Creatinine, Ser: 2.2 mg/dL — ABNORMAL HIGH (ref 0.4–1.5)
Sodium: 140 mEq/L (ref 135–145)
TCO2: 20 mmol/L (ref 0–100)

## 2010-12-07 LAB — CARDIAC PANEL(CRET KIN+CKTOT+MB+TROPI)
CK, MB: 2.2 ng/mL (ref 0.3–4.0)
Relative Index: INVALID (ref 0.0–2.5)
Relative Index: INVALID (ref 0.0–2.5)
Troponin I: 0.12 ng/mL — ABNORMAL HIGH (ref 0.00–0.06)
Troponin I: 0.13 ng/mL — ABNORMAL HIGH (ref 0.00–0.06)

## 2010-12-07 LAB — COMPREHENSIVE METABOLIC PANEL
AST: 39 U/L — ABNORMAL HIGH (ref 0–37)
Albumin: 2.8 g/dL — ABNORMAL LOW (ref 3.5–5.2)
Alkaline Phosphatase: 42 U/L (ref 39–117)
BUN: 41 mg/dL — ABNORMAL HIGH (ref 6–23)
Chloride: 98 mEq/L (ref 96–112)
GFR calc Af Amer: 36 mL/min — ABNORMAL LOW (ref >60)
Potassium: 3.7 mEq/L (ref 3.5–5.1)
Sodium: 137 mEq/L (ref 135–145)
Total Bilirubin: 0.8 mg/dL (ref 0.3–1.2)
Total Protein: 6.6 g/dL (ref 6.0–8.3)

## 2010-12-07 LAB — RAPID URINE DRUG SCREEN, HOSP PERFORMED
Barbiturates: NOT DETECTED
Benzodiazepines: NOT DETECTED
Cocaine: NOT DETECTED
Opiates: POSITIVE — AB

## 2010-12-07 LAB — PROTIME-INR
INR: 1.46 (ref 0.00–1.49)
INR: 1.58 — ABNORMAL HIGH (ref 0.00–1.49)

## 2010-12-07 LAB — URINALYSIS, ROUTINE W REFLEX MICROSCOPIC
Bilirubin Urine: NEGATIVE
Hgb urine dipstick: NEGATIVE
Ketones, ur: NEGATIVE mg/dL
Protein, ur: >300 mg/dL — AB
Urobilinogen, UA: 1 mg/dL (ref 0.0–1.0)

## 2010-12-07 LAB — POCT CARDIAC MARKERS
CKMB, poc: 1.5 ng/mL (ref 1.0–8.0)
Myoglobin, poc: 84 ng/mL (ref 12–200)

## 2010-12-07 LAB — BRAIN NATRIURETIC PEPTIDE
Pro B Natriuretic peptide (BNP): 1722 pg/mL — ABNORMAL HIGH (ref 0.0–100.0)
Pro B Natriuretic peptide (BNP): 2364 pg/mL — ABNORMAL HIGH (ref 0.0–100.0)
Pro B Natriuretic peptide (BNP): 2196 pg/mL — ABNORMAL HIGH (ref 0.0–100.0)

## 2010-12-07 LAB — URINE MICROSCOPIC-ADD ON

## 2010-12-07 LAB — APTT: aPTT: 33 seconds (ref 24–37)

## 2010-12-08 LAB — BASIC METABOLIC PANEL
BUN: 19 mg/dL (ref 6–23)
BUN: 20 mg/dL (ref 6–23)
BUN: 37 mg/dL — ABNORMAL HIGH (ref 6–23)
BUN: 25 mg/dL — ABNORMAL HIGH (ref 6–23)
CO2: 19 mEq/L (ref 19–32)
CO2: 26 mEq/L (ref 19–32)
CO2: 27 mEq/L (ref 19–32)
CO2: 28 mEq/L (ref 19–32)
CO2: 21 mEq/L (ref 19–32)
Calcium: 8.5 mg/dL (ref 8.4–10.5)
Calcium: 8.9 mg/dL (ref 8.4–10.5)
Calcium: 8.4 mg/dL (ref 8.4–10.5)
Calcium: 8.3 mg/dL — ABNORMAL LOW (ref 8.4–10.5)
Chloride: 102 mEq/L (ref 96–112)
Chloride: 104 mEq/L (ref 96–112)
Chloride: 99 mEq/L (ref 96–112)
Chloride: 107 mEq/L (ref 96–112)
Creatinine, Ser: 2.07 mg/dL — ABNORMAL HIGH (ref 0.4–1.5)
Creatinine, Ser: 2.12 mg/dL — ABNORMAL HIGH (ref 0.4–1.5)
Creatinine, Ser: 2.57 mg/dL — ABNORMAL HIGH (ref 0.4–1.5)
Creatinine, Ser: 2.14 mg/dL — ABNORMAL HIGH (ref 0.4–1.5)
Creatinine, Ser: 2.22 mg/dL — ABNORMAL HIGH (ref 0.4–1.5)
GFR calc Af Amer: 42 mL/min — ABNORMAL LOW (ref >60)
GFR calc Af Amer: 43 mL/min — ABNORMAL LOW (ref >60)
GFR calc Af Amer: 43 mL/min — ABNORMAL LOW (ref >60)
GFR calc Af Amer: 40 mL/min — ABNORMAL LOW (ref >60)
GFR calc Af Amer: 41 mL/min — ABNORMAL LOW (ref >60)
GFR calc non Af Amer: 35 mL/min — ABNORMAL LOW (ref >60)
Glucose, Bld: 122 mg/dL — ABNORMAL HIGH (ref 70–99)
Glucose, Bld: 89 mg/dL (ref 70–99)
Glucose, Bld: 198 mg/dL — ABNORMAL HIGH (ref 70–99)
Potassium: 3.5 mEq/L (ref 3.5–5.1)
Potassium: 4.2 mEq/L (ref 3.5–5.1)
Potassium: 4.2 mEq/L (ref 3.5–5.1)
Sodium: 139 mEq/L (ref 135–145)
Sodium: 138 mEq/L (ref 135–145)
Sodium: 137 mEq/L (ref 135–145)

## 2010-12-08 LAB — CARDIAC PANEL(CRET KIN+CKTOT+MB+TROPI)
CK, MB: 2.3 ng/mL (ref 0.3–4.0)
CK, MB: 3.3 ng/mL (ref 0.3–4.0)
CK, MB: 3.8 ng/mL (ref 0.3–4.0)
Relative Index: 0.6 (ref 0.0–2.5)
Relative Index: 0.6 (ref 0.0–2.5)
Relative Index: INVALID (ref 0.0–2.5)
Relative Index: INVALID (ref 0.0–2.5)
Relative Index: INVALID (ref 0.0–2.5)
Total CK: 652 U/L — ABNORMAL HIGH (ref 7–232)
Total CK: 163 U/L (ref 7–232)
Total CK: 99 U/L (ref 7–232)
Troponin I: 0.07 ng/mL — ABNORMAL HIGH (ref 0.00–0.06)
Troponin I: 0.06 ng/mL (ref 0.00–0.06)

## 2010-12-08 LAB — CBC
HCT: 36.1 % — ABNORMAL LOW (ref 39.0–52.0)
HCT: 37.5 % — ABNORMAL LOW (ref 39.0–52.0)
HCT: 40.5 % (ref 39.0–52.0)
Hemoglobin: 11.9 g/dL — ABNORMAL LOW (ref 13.0–17.0)
Hemoglobin: 13.5 g/dL (ref 13.0–17.0)
Hemoglobin: 11.3 g/dL — ABNORMAL LOW (ref 13.0–17.0)
MCHC: 32.9 g/dL (ref 30.0–36.0)
MCHC: 33 g/dL (ref 30.0–36.0)
MCHC: 33.4 g/dL (ref 30.0–36.0)
MCV: 88.9 fL (ref 78.0–100.0)
MCV: 90.7 fL (ref 78.0–100.0)
Platelets: 202 10*3/uL (ref 150–400)
Platelets: 244 10*3/uL (ref 150–400)
Platelets: 267 10*3/uL (ref 150–400)
RBC: 4.47 MIL/uL (ref 4.22–5.81)
RBC: 3.72 MIL/uL — ABNORMAL LOW (ref 4.22–5.81)
RDW: 14.4 % (ref 11.5–15.5)
WBC: 7.8 10*3/uL (ref 4.0–10.5)

## 2010-12-08 LAB — URINALYSIS, ROUTINE W REFLEX MICROSCOPIC
Glucose, UA: NEGATIVE mg/dL
Ketones, ur: NEGATIVE mg/dL
Leukocytes, UA: NEGATIVE
Leukocytes, UA: NEGATIVE
Nitrite: NEGATIVE
Protein, ur: 100 mg/dL — AB
Protein, ur: 100 mg/dL — AB
pH: 5.5 (ref 5.0–8.0)
pH: 6 (ref 5.0–8.0)

## 2010-12-08 LAB — COMPREHENSIVE METABOLIC PANEL
ALT: 13 U/L (ref 0–53)
Alkaline Phosphatase: 59 U/L (ref 39–117)
CO2: 27 mEq/L (ref 19–32)
GFR calc non Af Amer: 39 mL/min — ABNORMAL LOW (ref >60)
Glucose, Bld: 142 mg/dL — ABNORMAL HIGH (ref 70–99)
Potassium: 3.3 mEq/L — ABNORMAL LOW (ref 3.5–5.1)
Sodium: 138 mEq/L (ref 135–145)
Total Protein: 6.2 g/dL (ref 6.0–8.3)

## 2010-12-08 LAB — DIFFERENTIAL
Basophils Relative: 0 % (ref 0–1)
Basophils Relative: 1 % (ref 0–1)
Basophils Relative: 0 % (ref 0–1)
Eosinophils Absolute: 0 10*3/uL (ref 0.0–0.7)
Eosinophils Absolute: 0.4 10*3/uL (ref 0.0–0.7)
Eosinophils Absolute: 0.4 10*3/uL (ref 0.0–0.7)
Eosinophils Relative: 5 % (ref 0–5)
Lymphocytes Relative: 19 % (ref 12–46)
Lymphocytes Relative: 12 % (ref 12–46)
Lymphs Abs: 1.4 10*3/uL (ref 0.7–4.0)
Lymphs Abs: 1.5 10*3/uL (ref 0.7–4.0)
Monocytes Absolute: 0.5 10*3/uL (ref 0.1–1.0)
Monocytes Absolute: 0.4 10*3/uL (ref 0.1–1.0)
Monocytes Relative: 4 % (ref 3–12)
Monocytes Relative: 6 % (ref 3–12)
Monocytes Relative: 4 % (ref 3–12)
Neutro Abs: 8.8 10*3/uL — ABNORMAL HIGH (ref 1.7–7.7)
Neutrophils Relative %: 62 % (ref 43–77)

## 2010-12-08 LAB — URINE MICROSCOPIC-ADD ON

## 2010-12-08 LAB — RAPID URINE DRUG SCREEN, HOSP PERFORMED
Amphetamines: NOT DETECTED
Amphetamines: NOT DETECTED
Benzodiazepines: NOT DETECTED
Benzodiazepines: NOT DETECTED
Cocaine: NOT DETECTED
Cocaine: NOT DETECTED
Opiates: NOT DETECTED
Opiates: POSITIVE — AB
Tetrahydrocannabinol: POSITIVE — AB
Tetrahydrocannabinol: POSITIVE — AB
Tetrahydrocannabinol: POSITIVE — AB

## 2010-12-08 LAB — LIPID PANEL
Cholesterol: 156 mg/dL (ref 0–200)
LDL Cholesterol: 106 mg/dL — ABNORMAL HIGH (ref 0–99)

## 2010-12-08 LAB — MAGNESIUM: Magnesium: 1.9 mg/dL (ref 1.5–2.5)

## 2010-12-08 LAB — POCT CARDIAC MARKERS: Troponin i, poc: 0.07 ng/mL (ref 0.00–0.09)

## 2010-12-08 LAB — PROTIME-INR
INR: 1.3 (ref 0.00–1.49)
Prothrombin Time: 16.2 seconds — ABNORMAL HIGH (ref 11.6–15.2)

## 2010-12-08 LAB — CK TOTAL AND CKMB (NOT AT ARMC): Total CK: 840 U/L — ABNORMAL HIGH (ref 7–232)

## 2010-12-08 LAB — BRAIN NATRIURETIC PEPTIDE: Pro B Natriuretic peptide (BNP): >3200 pg/mL — ABNORMAL HIGH (ref 0.0–100.0)

## 2010-12-09 LAB — CBC
HCT: 36.2 % — ABNORMAL LOW (ref 39.0–52.0)
Hemoglobin: 12 g/dL — ABNORMAL LOW (ref 13.0–17.0)
MCHC: 33.3 g/dL (ref 30.0–36.0)
MCV: 90.6 fL (ref 78.0–100.0)
Platelets: 246 10*3/uL (ref 150–400)
RDW: 15.6 % — ABNORMAL HIGH (ref 11.5–15.5)
WBC: 8.1 10*3/uL (ref 4.0–10.5)

## 2010-12-09 LAB — BASIC METABOLIC PANEL
BUN: 28 mg/dL — ABNORMAL HIGH (ref 6–23)
BUN: 29 mg/dL — ABNORMAL HIGH (ref 6–23)
BUN: 30 mg/dL — ABNORMAL HIGH (ref 6–23)
CO2: 27 mEq/L (ref 19–32)
Calcium: 8.2 mg/dL — ABNORMAL LOW (ref 8.4–10.5)
Chloride: 101 mEq/L (ref 96–112)
Chloride: 107 mEq/L (ref 96–112)
Creatinine, Ser: 2.24 mg/dL — ABNORMAL HIGH (ref 0.4–1.5)
GFR calc Af Amer: 39 mL/min — ABNORMAL LOW (ref >60)
GFR calc non Af Amer: 33 mL/min — ABNORMAL LOW (ref >60)
Glucose, Bld: 92 mg/dL (ref 70–99)
Glucose, Bld: 99 mg/dL (ref 70–99)
Potassium: 4.5 mEq/L (ref 3.5–5.1)
Sodium: 137 mEq/L (ref 135–145)
Sodium: 138 mEq/L (ref 135–145)

## 2010-12-09 LAB — DIFFERENTIAL
Basophils Absolute: 0.1 10*3/uL (ref 0.0–0.1)
Basophils Relative: 1 % (ref 0–1)
Lymphocytes Relative: 24 % (ref 12–46)
Monocytes Relative: 5 % (ref 3–12)
Neutro Abs: 5.5 10*3/uL (ref 1.7–7.7)
Neutrophils Relative %: 69 % (ref 43–77)

## 2010-12-09 LAB — RAPID URINE DRUG SCREEN, HOSP PERFORMED
Amphetamines: NOT DETECTED
Benzodiazepines: NOT DETECTED
Cocaine: NOT DETECTED
Tetrahydrocannabinol: POSITIVE — AB

## 2010-12-09 LAB — CARDIAC PANEL(CRET KIN+CKTOT+MB+TROPI)
CK, MB: 3.8 ng/mL (ref 0.3–4.0)
Relative Index: 3.4 — ABNORMAL HIGH (ref 0.0–2.5)
Total CK: 113 U/L (ref 7–232)
Troponin I: 0.08 ng/mL — ABNORMAL HIGH (ref 0.00–0.06)
Troponin I: 0.1 ng/mL — ABNORMAL HIGH (ref 0.00–0.06)

## 2010-12-09 LAB — URINALYSIS, ROUTINE W REFLEX MICROSCOPIC
Ketones, ur: NEGATIVE mg/dL
Leukocytes, UA: NEGATIVE
Nitrite: NEGATIVE
Protein, ur: 100 mg/dL — AB

## 2010-12-09 LAB — POCT CARDIAC MARKERS
CKMB, poc: 2 ng/mL (ref 1.0–8.0)
Myoglobin, poc: 113 ng/mL (ref 12–200)

## 2010-12-09 LAB — PROTIME-INR
INR: 1.6 — ABNORMAL HIGH (ref 0.00–1.49)
Prothrombin Time: 18.6 seconds — ABNORMAL HIGH (ref 11.6–15.2)

## 2010-12-09 LAB — APTT: aPTT: 36 seconds (ref 24–37)

## 2010-12-13 LAB — COMPREHENSIVE METABOLIC PANEL
AST: 27 U/L (ref 0–37)
Albumin: 2.8 g/dL — ABNORMAL LOW (ref 3.5–5.2)
Alkaline Phosphatase: 63 U/L (ref 39–117)
Chloride: 108 mEq/L (ref 96–112)
Creatinine, Ser: 2.15 mg/dL — ABNORMAL HIGH (ref 0.4–1.5)
GFR calc Af Amer: 41 mL/min — ABNORMAL LOW (ref >60)
Potassium: 3.5 mEq/L (ref 3.5–5.1)
Total Bilirubin: 1 mg/dL (ref 0.3–1.2)
Total Protein: 5.7 g/dL — ABNORMAL LOW (ref 6.0–8.3)

## 2010-12-13 LAB — BASIC METABOLIC PANEL
BUN: 22 mg/dL (ref 6–23)
CO2: 31 mEq/L (ref 19–32)
Calcium: 8.3 mg/dL — ABNORMAL LOW (ref 8.4–10.5)
Calcium: 8.3 mg/dL — ABNORMAL LOW (ref 8.4–10.5)
Calcium: 8.5 mg/dL (ref 8.4–10.5)
Creatinine, Ser: 2.28 mg/dL — ABNORMAL HIGH (ref 0.4–1.5)
Creatinine, Ser: 2.41 mg/dL — ABNORMAL HIGH (ref 0.4–1.5)
GFR calc Af Amer: 38 mL/min — ABNORMAL LOW (ref >60)
GFR calc non Af Amer: 30 mL/min — ABNORMAL LOW (ref >60)
Glucose, Bld: 120 mg/dL — ABNORMAL HIGH (ref 70–99)
Potassium: 3.7 mEq/L (ref 3.5–5.1)
Sodium: 138 mEq/L (ref 135–145)

## 2010-12-13 LAB — POCT CARDIAC MARKERS
CKMB, poc: 2.2 ng/mL (ref 1.0–8.0)
Troponin i, poc: <0.05 ng/mL (ref 0.00–0.09)
Troponin i, poc: <0.05 ng/mL (ref 0.00–0.09)

## 2010-12-13 LAB — CARDIAC PANEL(CRET KIN+CKTOT+MB+TROPI)
CK, MB: 2.1 ng/mL (ref 0.3–4.0)
Relative Index: 1.5 (ref 0.0–2.5)
Total CK: 136 U/L (ref 7–232)
Total CK: 200 U/L (ref 7–232)
Troponin I: 0.12 ng/mL — ABNORMAL HIGH (ref 0.00–0.06)

## 2010-12-13 LAB — DIFFERENTIAL
Basophils Absolute: 0 10*3/uL (ref 0.0–0.1)
Basophils Absolute: 0.1 10*3/uL (ref 0.0–0.1)
Basophils Relative: 0 % (ref 0–1)
Eosinophils Absolute: 0.2 10*3/uL (ref 0.0–0.7)
Eosinophils Relative: 2 % (ref 0–5)
Eosinophils Relative: 5 % (ref 0–5)
Lymphocytes Relative: 13 % (ref 12–46)
Monocytes Absolute: 0.6 10*3/uL (ref 0.1–1.0)
Monocytes Absolute: 0.4 10*3/uL (ref 0.1–1.0)
Monocytes Relative: 5 % (ref 3–12)
Neutro Abs: 8.1 10*3/uL — ABNORMAL HIGH (ref 1.7–7.7)

## 2010-12-13 LAB — URINALYSIS, ROUTINE W REFLEX MICROSCOPIC
Glucose, UA: NEGATIVE mg/dL
Ketones, ur: NEGATIVE mg/dL
Nitrite: NEGATIVE
Protein, ur: NEGATIVE mg/dL
Urobilinogen, UA: 0.2 mg/dL (ref 0.0–1.0)

## 2010-12-13 LAB — CBC
Hemoglobin: 12.3 g/dL — ABNORMAL LOW (ref 13.0–17.0)
MCHC: 33.5 g/dL (ref 30.0–36.0)
Platelets: 241 10*3/uL (ref 150–400)
RDW: 16 % — ABNORMAL HIGH (ref 11.5–15.5)
RDW: 16 % — ABNORMAL HIGH (ref 11.5–15.5)
WBC: 8.3 10*3/uL (ref 4.0–10.5)

## 2010-12-13 LAB — URINE CULTURE: Culture: NO GROWTH

## 2010-12-13 LAB — RAPID URINE DRUG SCREEN, HOSP PERFORMED
Amphetamines: NOT DETECTED
Benzodiazepines: NOT DETECTED
Cocaine: NOT DETECTED
Opiates: NOT DETECTED
Tetrahydrocannabinol: POSITIVE — AB

## 2010-12-13 LAB — ETHANOL: Alcohol, Ethyl (B): <5 mg/dL (ref 0–10)

## 2010-12-18 LAB — COMPREHENSIVE METABOLIC PANEL
ALT: 21 U/L (ref 0–53)
CO2: 23 mEq/L (ref 19–32)
Calcium: 8.5 mg/dL (ref 8.4–10.5)
Creatinine, Ser: 2.28 mg/dL — ABNORMAL HIGH (ref 0.4–1.5)
GFR calc non Af Amer: 32 mL/min — ABNORMAL LOW (ref >60)
Glucose, Bld: 137 mg/dL — ABNORMAL HIGH (ref 70–99)

## 2010-12-18 LAB — POCT CARDIAC MARKERS
CKMB, poc: 1.7 ng/mL (ref 1.0–8.0)
Myoglobin, poc: 160 ng/mL (ref 12–200)
Troponin i, poc: <0.05 ng/mL (ref 0.00–0.09)

## 2010-12-18 LAB — DIFFERENTIAL
Eosinophils Absolute: 0 10*3/uL (ref 0.0–0.7)
Lymphocytes Relative: 11 % — ABNORMAL LOW (ref 12–46)
Lymphs Abs: 1 10*3/uL (ref 0.7–4.0)
Neutrophils Relative %: 85 % — ABNORMAL HIGH (ref 43–77)

## 2010-12-18 LAB — CARDIAC PANEL(CRET KIN+CKTOT+MB+TROPI)
Relative Index: 1.8 (ref 0.0–2.5)
Troponin I: 0.29 ng/mL — ABNORMAL HIGH (ref 0.00–0.06)

## 2010-12-18 LAB — CBC
HCT: 37.1 % — ABNORMAL LOW (ref 39.0–52.0)
Hemoglobin: 12.5 g/dL — ABNORMAL LOW (ref 13.0–17.0)
MCHC: 33.8 g/dL (ref 30.0–36.0)
MCV: 81 fL (ref 78.0–100.0)
RBC: 4.58 MIL/uL (ref 4.22–5.81)

## 2010-12-18 LAB — PROTIME-INR
INR: 1.5 (ref 0.00–1.49)
Prothrombin Time: 18.6 seconds — ABNORMAL HIGH (ref 11.6–15.2)

## 2010-12-19 LAB — CBC
HCT: 37.7 % — ABNORMAL LOW (ref 39.0–52.0)
Hemoglobin: 10.7 g/dL — ABNORMAL LOW (ref 13.0–17.0)
MCHC: 32.4 g/dL (ref 30.0–36.0)
MCHC: 33.1 g/dL (ref 30.0–36.0)
MCV: 81.5 fL (ref 78.0–100.0)
MCV: 81.8 fL (ref 78.0–100.0)
MCV: 82 fL (ref 78.0–100.0)
Platelets: 196 10*3/uL (ref 150–400)
Platelets: 202 10*3/uL (ref 150–400)
Platelets: 219 10*3/uL (ref 150–400)
RBC: 3.81 MIL/uL — ABNORMAL LOW (ref 4.22–5.81)
RBC: 3.98 MIL/uL — ABNORMAL LOW (ref 4.22–5.81)
RBC: 4 MIL/uL — ABNORMAL LOW (ref 4.22–5.81)
RBC: 4.61 MIL/uL (ref 4.22–5.81)
RDW: 21 % — ABNORMAL HIGH (ref 11.5–15.5)
WBC: 4.1 10*3/uL (ref 4.0–10.5)
WBC: 5.2 10*3/uL (ref 4.0–10.5)
WBC: 5.7 10*3/uL (ref 4.0–10.5)
WBC: 8.5 10*3/uL (ref 4.0–10.5)

## 2010-12-19 LAB — CARDIAC PANEL(CRET KIN+CKTOT+MB+TROPI)
CK, MB: 1.5 ng/mL (ref 0.3–4.0)
CK, MB: 2.7 ng/mL (ref 0.3–4.0)
Relative Index: 3.2 — ABNORMAL HIGH (ref 0.0–2.5)
Relative Index: INVALID (ref 0.0–2.5)
Total CK: 133 U/L (ref 7–232)
Total CK: 141 U/L (ref 7–232)
Troponin I: 0.2 ng/mL — ABNORMAL HIGH (ref 0.00–0.06)
Troponin I: 1.1 ng/mL (ref 0.00–0.06)
Troponin I: 1.56 ng/mL (ref 0.00–0.06)

## 2010-12-19 LAB — DIFFERENTIAL
Basophils Absolute: 0 10*3/uL (ref 0.0–0.1)
Basophils Absolute: 0 10*3/uL (ref 0.0–0.1)
Basophils Relative: 0 % (ref 0–1)
Basophils Relative: 0 % (ref 0–1)
Basophils Relative: 1 % (ref 0–1)
Eosinophils Absolute: 0.1 10*3/uL (ref 0.0–0.7)
Eosinophils Absolute: 0.1 10*3/uL (ref 0.0–0.7)
Eosinophils Absolute: 0.1 10*3/uL (ref 0.0–0.7)
Eosinophils Absolute: 0.4 10*3/uL (ref 0.0–0.7)
Eosinophils Relative: 7 % — ABNORMAL HIGH (ref 0–5)
Lymphocytes Relative: 20 % (ref 12–46)
Lymphocytes Relative: 29 % (ref 12–46)
Lymphocytes Relative: 29 % (ref 12–46)
Lymphs Abs: 1 10*3/uL (ref 0.7–4.0)
Lymphs Abs: 1 10*3/uL (ref 0.7–4.0)
Lymphs Abs: 1.2 10*3/uL (ref 0.7–4.0)
Lymphs Abs: 1.7 10*3/uL (ref 0.7–4.0)
Monocytes Absolute: 0.4 10*3/uL (ref 0.1–1.0)
Monocytes Relative: 11 % (ref 3–12)
Monocytes Relative: 6 % (ref 3–12)
Monocytes Relative: 7 % (ref 3–12)
Monocytes Relative: 9 % (ref 3–12)
Neutro Abs: 2.3 10*3/uL (ref 1.7–7.7)
Neutro Abs: 5.2 10*3/uL (ref 1.7–7.7)
Neutro Abs: 6.9 10*3/uL (ref 1.7–7.7)
Neutrophils Relative %: 70 % (ref 43–77)
Neutrophils Relative %: 83 % — ABNORMAL HIGH (ref 43–77)

## 2010-12-19 LAB — CULTURE, BLOOD (ROUTINE X 2): Report Status: 2072010

## 2010-12-19 LAB — URINALYSIS, MICROSCOPIC ONLY
Leukocytes, UA: NEGATIVE
Nitrite: NEGATIVE
Specific Gravity, Urine: 1.015 (ref 1.005–1.030)
Urine-Other: NONE SEEN
pH: 5 (ref 5.0–8.0)

## 2010-12-19 LAB — RAPID URINE DRUG SCREEN, HOSP PERFORMED
Benzodiazepines: NOT DETECTED
Cocaine: NOT DETECTED
Tetrahydrocannabinol: POSITIVE — AB

## 2010-12-19 LAB — HEMOCCULT GUIAC POC 1CARD (OFFICE): Fecal Occult Bld: NEGATIVE

## 2010-12-19 LAB — BASIC METABOLIC PANEL
BUN: 29 mg/dL — ABNORMAL HIGH (ref 6–23)
BUN: 33 mg/dL — ABNORMAL HIGH (ref 6–23)
CO2: 28 mEq/L (ref 19–32)
Calcium: 7.5 mg/dL — ABNORMAL LOW (ref 8.4–10.5)
Chloride: 102 mEq/L (ref 96–112)
Chloride: 98 mEq/L (ref 96–112)
Creatinine, Ser: 2.37 mg/dL — ABNORMAL HIGH (ref 0.4–1.5)
Creatinine, Ser: 2.6 mg/dL — ABNORMAL HIGH (ref 0.4–1.5)
GFR calc Af Amer: 33 mL/min — ABNORMAL LOW (ref >60)
GFR calc Af Amer: 46 mL/min — ABNORMAL LOW (ref >60)
GFR calc non Af Amer: 30 mL/min — ABNORMAL LOW (ref >60)
GFR calc non Af Amer: 38 mL/min — ABNORMAL LOW (ref >60)
Potassium: 4.1 mEq/L (ref 3.5–5.1)
Sodium: 138 mEq/L (ref 135–145)

## 2010-12-19 LAB — COMPREHENSIVE METABOLIC PANEL
ALT: 34 U/L (ref 0–53)
AST: 60 U/L — ABNORMAL HIGH (ref 0–37)
Albumin: 2.6 g/dL — ABNORMAL LOW (ref 3.5–5.2)
CO2: 27 mEq/L (ref 19–32)
Chloride: 98 mEq/L (ref 96–112)
GFR calc Af Amer: 29 mL/min — ABNORMAL LOW (ref >60)
GFR calc non Af Amer: 24 mL/min — ABNORMAL LOW (ref >60)
Sodium: 135 mEq/L (ref 135–145)
Total Bilirubin: 1 mg/dL (ref 0.3–1.2)

## 2010-12-19 LAB — D-DIMER, QUANTITATIVE: D-Dimer, Quant: 2.81 ug/mL-FEU — ABNORMAL HIGH (ref 0.00–0.48)

## 2010-12-19 LAB — BRAIN NATRIURETIC PEPTIDE: Pro B Natriuretic peptide (BNP): 2080 pg/mL — ABNORMAL HIGH (ref 0.0–100.0)

## 2010-12-19 LAB — HEPARIN LEVEL (UNFRACTIONATED): Heparin Unfractionated: 0.38 IU/mL (ref 0.30–0.70)

## 2011-01-15 ENCOUNTER — Telehealth: Payer: Self-pay | Admitting: Cardiology

## 2011-01-15 MED ORDER — NITROGLYCERIN 0.4 MG SL SUBL: 0.4000 mg | SUBLINGUAL_TABLET | SUBLINGUAL | Status: DC | PRN

## 2011-01-15 NOTE — Telephone Encounter (Signed)
They need and order for pt nitro-stat

## 2011-01-16 NOTE — Assessment & Plan Note (Signed)
[facility]                            CARDIOLOGY OFFICE NOTE   NAME:[name], [name]                      MRN:          [id number]  DATE:06/15/2009                            DOB:          06-Jun-1966    PRIMARY CARE PHYSICIAN:  None.   REASON FOR PRESENTATION:  Evaluate the patient for cardiomyopathy and  hypertension.   HISTORY OF PRESENT ILLNESS:  The patient presents for an office visit.  This is remarkable in that he consistently misses these appointments.  He has been in the hospital several times this year with hypertensive  urgency, chest pain, and heart failure.  The issue of compliance with  his medications has been very difficult to solve.  It is clear that many  of the times he does not have his medications because of cost.  However,  at times, he reports that he has been compliant and he still has  hypertensive urgencies.  Today, he feels well.  However, his blood  pressure is as recorded.  He is not describing any shortness of breath,  PND, or orthopnea.  He is not having any chest discomfort, neck or arm  discomfort.  He reports that he has been taking all of his medications.  He says he is observant of salt and fluid restriction.  He says he does  have a lot of stress in his life which probably contributes.   PAST MEDICAL HISTORY:  1. Cardiomyopathy (mixed ischemic and nonischemic EF of 25%).  2. Coronary artery disease (non-ST-elevation myocardial infarction in      2008, in the setting of epistaxis and anemia.  Catheterization at      that time demonstrated 50% mid LAD stenosis.  The RCA was diffusely      diseased with 80% proximal stenosis in 3 locations and 90% mid      disease with an ulcerated plaque.  There was a marginal branch of      the right coronary artery which was occluded.  The mid right was      totally occluded with left-to-right collaterals.  He was managed      medically).  3. Hypertension.  4.  Hyperlipidemia.  5. Medical noncompliance.  6. Previous polysubstance abuse.  7. Chronic renal insufficiency.   ALLERGIES:  None.   MEDICATIONS:  1. Amlodipine 5 mg daily.  2. Clonidine 0.1 mg b.i.d.  3. Lasix 40 mg b.i.d.  4. Aspirin 325 mg daily.  5. Coreg 25 mg b.i.d.  6. Hydralazine 100 mg t.i.d.  7. Imdur 120 mg daily.  8. K-Dur 20 mEq b.i.d.  9. Pravastatin 40 mg daily.   REVIEW OF SYSTEMS:  As stated in the HPI and otherwise negative for all  other systems.   PHYSICAL EXAMINATION:  GENERAL:  The patient is in no distress.  VITAL SIGNS:  Blood pressure 190/148, heart rate 88 and regular, weight  183 pounds.  HEENT:  Eyelids unremarkable.  Pupils equal, round, and reactive to  light.  Fundi not visualized.  Oral mucosa unremarkable.  NECK:  Jugular venous distention at the angle of the jaw  at 45 degrees.  Carotid upstroke brisk and symmetric.  No bruits, no thyromegaly.  LYMPHATICS:  No cervical, axillary, inguinal adenopathy.  LUNGS:  Clear to auscultation bilaterally.  BACK:  No costovertebral angle tenderness.  CHEST:  Unremarkable.  HEART:  PMI not displaced or sustained.  S1 and S2 within normal limits.  Positive S3, no S4.  No clicks, no rubs, no murmurs.  ABDOMEN:  Flat.  Positive bowel sounds, normal in frequency and pitch.  No bruits, no rebound, no guarding.  No midline pulsatile mass.  No  organomegaly.  No splenomegaly.  SKIN:  No rashes.  No nodules.  EXTREMITIES:  2+ pulses throughout.  No edema, no cyanosis, no clubbing.  NEURO:  Oriented to person, place, and time.  Cranial nerves II through  XII grossly intact.  Motor grossly intact.   ASSESSMENT AND PLAN:  1. Hypertension.  The patient is hypertensive again.  This is his      normal when he comes for these appointments or when he presents to      the emergency room.  It is usually treatable in the hospital which      always makes me wonder about compliance.  Today, he was given 0.2      of  clonidine.  This was repeated.  He was kept in the office for      about 90 minutes.  He had no complaints.  He is going to go home      and start taking 0.3 mg twice a day of the clonidine in addition to      the other medications.  He needs to come back in a couple of weeks      for further medication titration.  He understands the need for salt      and fluid restriction.  At this point, I do not think      hospitalization is indicated as this is his usual state.  He does      not want to come to the hospital.  He has no complaints.  He      understands he is at very high risk in the future for strokes or      progressive heart failure.  2. Congestive heart failure.  This will be managed as above.  3. Polysubstance abuse and compliance.  He has had multiple lectures      about this and hopefully, he will see the light over time.  Of      note, Medicaid is pending which should help him.     Rollene Rotunda, MD, [facility]  Electronically Signed    JH/MedQ  DD: 06/15/2009  DT: 06/16/2009  Job #: [id number]

## 2011-01-16 NOTE — Discharge Summary (Signed)
NAMEJACEY, PELC NO.:  [id number]   MEDICAL RECORD NO.:  [id number]          PATIENT TYPE:  INP   LOCATION:  A309                          FACILITY:  [facility]   PHYSICIAN:  Skeet Latch, DO    DATE OF BIRTH:  1966-05-21   DATE OF ADMISSION:  10/03/2008  DATE OF DISCHARGE:  02/05/2010LH                               DISCHARGE SUMMARY   DISCHARGE DIAGNOSIS:  1. Hypertensive crisis.  2. Chest pain, resolved.  3. Suspected non-ST myocardial infarction, type 2.  4. History of chronic renal insufficiency.  5. History of coronary artery disease.  6. History of anemia.   BRIEF HOSPITAL COURSE:  Please see H and P done by Dr. Rito Ehrlich on  October 03, 2008.  Briefly, this is a 45 year old African American male  which presented with hypertensive crisis.  The patient stated he ran out  of his medications 1 day prior and appeared the patient has not had his  medication refilled since his discharge back in July 28, 2008.  It  was apparent patient had been noncompliant for the last few months with  his medication and on a regular basis.  The patient presented  complaining of chest pain and headache.  The patient said his chest pain  was all over his chest, he stated it was 10/10 intensity.  The patient  also was complaining of severe headache.  The patient also admitted to  some shortness of breath, some palpitations and nausea and vomiting.  Initially, the patient presented with vitals of temperature 98.3, blood  pressure 180/145, heart rate in the 80s, respiratory rate 20, he is  saturating 100% on 2 liters.  He had glucose 137, BUN 29, creatinine  2.2, his BNP was greater than 3200.  Chest x-ray showed severe  cardiomegaly and vascular congestion, no overt interstitial edema.  EKG  showed sinus rhythm with left deviation.  Shows chronic P-waves, so  evidence of LVH.  The patient was admitted with hypertensive urgency.  In the emergency room he was given Nitro  paste and some other  medications for his hypertensive crisis without relief.  The patient was  placed on a nitro drip and sent to the ICU.  He was initiated on IV  Lasix also.  The patient also developed a low-grade fever, he was placed  on Tylenol, started on antibiotics.  For his acute on chronic renal  surgery he was started on normal saline.  The patient was also placed on  pain medications for his headache.  The patient had a D-dimer ordered  and a VQ scan ordered.  His D-dimer was 2.81, subsequent VQ scan showed  cardiomegaly, showed a matching diminished ventilation-perfusion in the  left lower lobe, showed findings representative of low probability of  pulmonary embolism.  The patient was maintained on a nitro drip, this  was subsequently switched to p.o. medications.  The patient was  transferred to a general medical bed.  The patient had elevated  troponins, cardiology was consulted.  There was concern the patient  suffered a non-Q-wave MI.  Continued to manage the patient medically.  Plavix was added to his medical regimen.  The patient's other  hypertensive meds were adjusted, he was also started on Coreg twice a  day.  He was also started on Imdur and his hydralazine doses were  adjusted.  His clonidine and labetalol were discontinued.  The patient  has been improving on his p.o. medications for his blood pressure and  the patient is no longer complaining of any chest pain.  He is seen by  cardiology today, recommended patient stay on his aspirin, Plavix,  Coreg, Lasix, hydralazine, Imdur, Norvasc and Zocor and states that his  hydralazine dose will be 75 mg three times a day, may need to be  adjusted in the near future.  They recommended the patient not be on  clonidine secondary to his noncompliance issues and they will schedule  him with Dr. Antoine Poche, cardiologist in [place] office, in approximately  2 weeks.   MEDICATIONS ON DISCHARGE:  1. Norvasc 10 mg one tablet  daily.  2. Aspirin 325 mg daily.  3. Simvastatin 20 mg daily.  4. Nu-Iron 150 mg daily.  5. Nitroglycerin 0.4 mg sublingual as needed.  6. Coreg 6.25 mg one tablet twice daily.  7. Plavix 75 mg one tablet daily.  8. Hydralazine 50 mg one tablet 3 times a day.  9. Imdur 60 mg one tablet daily.  10.Lasix 1 tablet twice a day.   Vital signs on discharge:  Temperature is 97.9, pulse 64, respirations  18, blood pressure 162/100, saturating 97% on room air.  Last BNP is  2080, so far his blood cultures are unremarkable.  Sodium 138, potassium  4.1, chloride 102, CO2 is 31, glucose is 99, BUN 24, creatinine 1.97,  white count is 5.7, hemoglobin 12.4, hematocrit 37.7, platelet count  259,000.   CONDITION ON DISCHARGE:  Stable.   DISPOSITION:  The patient to be discharged home.   DISCHARGE INSTRUCTIONS:  The patient to maintain low-salt, heart-healthy  diet.  He is to increase his activity slowly.  The patient is to follow-  up with Dr. Antoine Poche in [place] with [facility] on November 24, 2008 at 12:00 p.m., phone number and date will be given to the patient.  The patient instructed to take all medications as prescribed and to take  his medications as directed secondary to his severe hypertension and his  heart failure.  Believe patient understands these instructions.  Patient  told to return to emergency room if he has any problems with chest pain,  severe headache or any similar symptoms.  Last chest x-ray showed  improved pulmonary vascularity, improved bilateral atelectasis and a  tiny right effusion.  No residual edema.      Skeet Latch, DO  Electronically Signed     SM/MEDQ  D:  10/08/2008  T:  10/08/2008  Job:  [id number]   cc:   Rollene Rotunda, MD, [facility]  [address]  [address]  [place]  [place] [address]

## 2011-01-16 NOTE — H&P (Signed)
[name], [name] NO.:  [id number]   MEDICAL RECORD NO.:  [id number]          PATIENT TYPE:  INP   LOCATION:  IC07                          FACILITY:  [facility]   PHYSICIAN:  Skeet Latch, DO    DATE OF BIRTH:  07/14/1966   DATE OF ADMISSION:  10/07/2007  DATE OF DISCHARGE:  LH                              HISTORY & PHYSICAL   CHIEF COMPLAINT:  Chest pain.   HISTORY OF PRESENT ILLNESS:  This is a 45 year old, African American  male who presents with sudden-onset of chest pain.  The patient states  that at approximately 7:30 p.m. last evening, he started having chest  pain while sitting.  The patient states that he got up and had more mid  sternal chest discomfort with a headache, nausea and vomiting.  At that  time, his sister called EMS.  The patient states the pain was sharp in  nature radiating at an 8/10.  It was substernal located in the center of  his chest.  The patient states he was given nitroglycerin sublingual  spray and this slightly relieved the pain prior to coming to the  emergency room.  The patient now states his pain is 4/10.  He states it  seems to be improved.  His headache, nausea and vomiting is also  improved at this time.  The patient has a longstanding history of very  difficult to control blood pressure, non-ST-elevation myocardial  infarction and renal failure.  At this time, the patient is in the ICU  secondary to having extremely elevated blood pressure in the emergency  room and seems to be stable at this time.   PAST MEDICAL HISTORY:  1. Coronary artery disease.  2. Non-ST-elevation MI in April 2008.  3. Cardiomyopathy with an EF of 20-30%.  4. Hypertension.  5. Chronic renal insufficiency.  6. Tobacco abuse.  7. History of pneumonia.  8. History of marijuana use.   PAST SURGICAL HISTORY:  Cardiac catheterization that demonstrated a  nonobstructive disease in the left system with 80% lesion in right  coronary artery and  occluded mid, right coronary vessel.   SOCIAL HISTORY:  The patient smokes 2-3 cigarettes a day for the last 2-  3 years.  Admits to drinking social alcohol.  Denies any drug use at  this time.   ALLERGIES:  No known drug allergies.   FAMILY HISTORY:  Mother deceased from diabetes.   HOME MEDICATIONS:  1. Furosemide 40 mg tablet twice a day.  2. Isosorbide mononitrate 60 mg one tablet daily.  3. Coreg 25 mg twice a day.  4. Simvastatin 80 mg at bedtime.  5. Clonidine 0.3 mg twice a day.  6. Lisinopril 40 mg twice daily.   REVIEW OF SYSTEMS:  GENERAL:  Denies any weight loss, weight gain,  fevers, chills.  CARDIOVASCULAR:  Positive for chest pain, no  palpitations.  RESPIRATORY:  Denies any shortness of breath or dyspnea.  GI:  Positive for nausea, vomiting, no diarrhea.  No bloody stools.  GENITOURINARY:  No urgency or frequency.  MUSCULOSKELETAL:  Arthralgias  and myalgias.  Other systems  are negative.   PHYSICAL EXAMINATION:  GENERAL:  This is a 45 year old, African American  male, well-developed, well-nourished, well-hydrated in no acute  distress, pleasant and cooperative.  VITAL SIGNS:  Blood pressure 172/127, heart rate 73, respirations 14,  saturations 100%.  CARDIAC:  Regular rate and rhythm with a 2/6 systolic ejection murmur.  RESPIRATORY:  Lungs clear to auscultation bilaterally.  No rales,  rhonchi or wheezing.  ABDOMEN:  Soft, nontender, nondistended, no rigidity or guarding.  EXTREMITIES:  No clubbing, cyanosis or edema.  HEENT:  Head is atraumatic, normocephalic.  PERRLA.  EOMI.  NECK:  Supple, nontender, nondistended.  No JVD is appreciated.   LABORATORY DATA AND X-RAY FINDINGS:  EKG showed sinus tachycardia with  biatrial enlargement and LVH.  Chest x-ray showed CHF with cardiomegaly.   CK-MB 2.4, troponin less than 0.05.  Drug screen is positive for opiates  and cannabinoids.  BNP 2690.  Sodium 136, potassium 3.6, chloride 104,  CO2 24, glucose 122, BUN  27, creatinine 2.30, calcium 8.7.  White count  14.9, hemoglobin 13.9, hematocrit 40.8, platelets 280.   ASSESSMENT/PLAN:  1. Chest pain.  Will get cardiac enzymes.  Check TSH and fasting lipid      panel.  Cardiology consult due to the patient's longstanding      coronary artery disease and non-ST-elevated myocardial infarction.  2. Hypertension.  The patient was on a nitroglycerin drip, but this      did not seem to help.  He has been switched to labetalol drip.  The      patient was given 20 mg intravenous bolus and seemed to improve.      The patient will be kept on labetalol drip at this time.  Will      switch is clonidine p.o. to patch to see if this improves his blood      pressure also.  Will discontinue his nitroglycerin drip.  3. Renal insufficiency.  Will get a nephrology consult.  Will continue      to monitor.  4. Coronary artery disease.  Will continue to follow along with      cardiology.  5. Congestive heart failure.  Will switch his p.o. Lasix to      intravenous and continue to follow his B-type natriuretic peptide.      Skeet Latch, DO  Electronically Signed     SM/MEDQ  D:  10/07/2007  T:  10/07/2007  Job:  [id number]

## 2011-01-16 NOTE — Assessment & Plan Note (Signed)
[facility]                            CARDIOLOGY OFFICE NOTE   NAME:[name], [name]                      MRN:          [id number]  DATE:05/21/2007                            DOB:          [birth year]    PRIMARY CARE PHYSICIAN:  None.   REASON FOR VISIT:  Evaluate patient with hypertension and  cardiomyopathy.   HISTORY OF PRESENT ILLNESS:  The patient returns for followup of the  above. He was last seen in our office on May 6. He has seen physicians  at [facility] and has had his lisinopril and carvedilol increased.  He says he has been taking his medicines. However, today his blood  pressure started out at 246/142. He said he had been feeling well. This  was a routine followup. He has not been having any light-headedness or  dizziness. He has had no headaches. He has had no blurred vision. He  denies any motor changes or speech disturbances. He has had no shortness  of breath and denies any PND or orthopnea. He has had no palpitations,  presyncope or syncope. He denies any chest discomfort, neck or arm  discomfort.   PAST MEDICAL HISTORY:  Coronary artery disease (April 2008 non ST  segment elevation myocardial infarction. Catheterization demonstrated  nonobstructive disease in the left tree with tandem 80% lesions in the  right coronary artery and an occluded mid right coronary artery and RV  branch. 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 EF 20-30% with moderate to marked left  ventricular hypertrophy.), difficult to control hypertension, chronic  renal insufficiency, tobacco abuse epistaxis and acute blood loss  anemia, history of pneumonia, history of marijuana use.   ALLERGIES:  None.   MEDICATIONS:  1. Simvastatin 80 mg daily.  2. Isosorbide 60 mg daily.  3. Clonidine 0.1 mg b.i.d.  4. Lisinopril 40 mg daily.  5. Carvedilol 25 mg b.i.d.   REVIEW OF SYSTEMS:  As stated in the  HPI and otherwise negative for  other systems.   PHYSICAL EXAMINATION:  The patient is in no distress.  VITAL SIGNS:  Blood pressure 246/142 (At the time he left the office  after 0.3 mg of clonidine it was 208/110. We did keep him lying down and  quiet for over an hour.), heart rate 49, weight 202 pounds, body mass  index 30.  HEENT:  Eyelids unremarkable. Pupils equal round and reactive to light.  Fundi not visualized. I had a difficult time visualizing these. Oral  mucosa unremarkable.  NECK:  No jugular venous distention at 45 degrees, carotid upstroke  brisk and symmetric, no bruits, no thyromegaly.  LYMPHATICS:  No cervical, axillary or inguinal adenopathy.  LUNGS:  Clear to auscultation bilaterally.  BACK:  No costovertebral angle tenderness.  CHEST:  Unremarkable.  HEART:  PMI not displaced or sustained, S1 and S2 within normal limits,  no S3, no S4, no clicks, no rubs, 2/6 apical systolic murmur radiating  slightly out the aortic outflow tract, no diastolic murmurs.  ABDOMEN:  Flat, positive bowel sounds, normal in frequency and pitch, no  bruits, no rebound, no guarding, no midline pulsatile mass, no  hepatomegaly, no splenomegaly.  SKIN:  No rashes, no nodules.  EXTREMITIES:  2+ pulses throughout, no edema, no cyanosis, no clubbing.  NEUROLOGIC:  Oriented to person, place and time. Cranial nerves II-XII  grossly intact. Motor grossly intact.   ASSESSMENT/PLAN:  1. We kept the patient in the office for over an hour. We treated him      first with 0.2 mg of Clonidine. He did not have much improvement in      his blood pressure. However, after 0.1 mg of Clonidine he had      improved his blood pressure to 200/110. There is no objective      evidence of end organ acute changes though I could not see his      fundi well. He is not having any symptoms. At this point, I am      going to increase his clonidine to 0.2 mg b.i.d. He will remain on      the other medications as  listed. He is going to come back in 2 days      to the [place] [facility] (he cannot get to [place]) and have a      blood pressure check. He will probably need further med titration.  2. Cardiomyopathy. He is on a reasonable dose of lisinopril and      carvedilol. We cannot go up on this carvedilol because of his      bradycardia. I do not know if he will take a complicated course of      isosorbide and hydralazine. However, this will most likely be the      next step to add the t.i.d. hydralazine. I am still worried about      compliance.  3. Followup. He will come back for the 2-day blood pressure check and      again to this office in 2 weeks.     Rollene Rotunda, MD, [facility]  Electronically Signed    JH/MedQ  DD: 05/21/2007  DT: 05/22/2007  Job #: [id number]

## 2011-01-16 NOTE — Discharge Summary (Signed)
[name], BANAS NO.:  [id number]   MEDICAL RECORD NO.:  [id number]          PATIENT TYPE:  INP   LOCATION:  4711                         FACILITY:  [facility]   PHYSICIAN:  Hillery Aldo, M.D.   DATE OF BIRTH:  1966-01-07   DATE OF ADMISSION:  03/25/2008  DATE OF DISCHARGE:  03/29/2008                               DISCHARGE SUMMARY   PRIMARY CARE PHYSICIAN:  Delaney Meigs, M.D.   EAR, NOSE, AND THROAT SPECIALIST:  Kristine Garbe. Ezzard Standing, M.D.   DISCHARGE DIAGNOSES:  1. Hematemesis secondary to epistaxis.  2. Acute blood loss anemia.  3. History of coronary artery disease with myocardial infarction in      the past.  4. Stage III chronic kidney disease.  5. History of congestive heart failure/ischemic cardiomyopathy with an      ejection fraction of 20-30%.  6. Dyslipidemia.  7. Sinus bradycardia.  8. Hypertensive urgency.   DISCHARGE MEDICATIONS:  1. Norvasc 10 mg daily.  2. Lasix 40 mg b.i.d.  3. Clonidine 0.3 mg b.i.d.  4. Coreg 25 mg daily.  5. Aspirin 325 mg daily.  6. Simvastatin 20 mg q.h.s.  7. Hydralazine 50 mg t.i.d.  8. Nu-Iron 150 mg 2 times a day.   CONSULTATIONS:  [name]. [name], MD of Gastroenterology.   BRIEF ADMISSION HPI:  The patient is a 45 year old male who presented to  the hospital with chief complaint of vomiting coffee ground-type emesis  and nosebleed.  Upon initial evaluation in the emergency department, he  was found to be in hypertensive urgency and had evidence of black/blood  containing stools, and therefore, was admitted for further evaluation  and workup.  For full details, please see the dictated report done by  Dr. Flonnie Overman.   PROCEDURES AND DIAGNOSTIC STUDIES.:  1. Chest x-ray on March 25, 2008, showed no active cardiopulmonary      disease.  2. Upper endoscopy on March 26, 2008, showed no bleeding, blood, or      lesions seen in the proximal esophagus to this second portion of      the duodenum.   DISCHARGE  LABORATORY VALUES:  Sodium was 139, potassium 3.6, chloride  103, bicarb 30, BUN 23, creatinine 2.05, glucose 106.  White blood cell  count was 5.7, hemoglobin 8.7, hematocrit 26.4, platelets 162.   HOSPITAL COURSE:  1. Acute blood loss anemia secondary to epistaxis:  The patient was      admitted with evidence of significant acute blood loss anemia.      Because of concerns that his blood loss may be stemming from a      possible upper GI bleed, GI consultation was requested and kindly      provided by Dr. [name].  The patient did undergo an upper endoscopy      with no evidence of blood in the stomach or duodenum.  He had known      history of epistaxis requiring cautery in the past.  It was felt      that his blood loss and heme-positive stools likely were due to  swallowing blood from a nasal source.  Nevertheless, the bleeding      has stopped, and he currently has a stable hemoglobin and      hematocrit.  The patient will see Dr. Ezzard Standing in followup on Monday,      April 05, 2008, at 12:30 p.m. as an outpatient.  He is instructed      to come to the hospital if he experiences any further nosebleeds or      episodes of vomiting blood.  2. Hypertensive urgency:  The patient did have a marked elevation of      his blood pressure on initial presentation.  His admitting blood      pressure was 184/123.  He has been well controlled on his usual      medications, which raises the question of whether he is fully      compliant with his antihypertensives.  The patient has been      encouraged to take his medicines exactly as prescribed and to not      skip doses.  3. History of coronary artery disease/congestive heart      failure/ischemic cardiomyopathy with transient chest pain.  The      patient did have some atypical chest pain during the course of his      hospitalization.  A 12-lead EKG did not reveal any acute changes.      His aspirin, which was initially held was reinstituted.   Cardiac      enzymes were negative.  He is now chest pain free.  No further      workup was undertaken..  4. Dyslipidemia:  The patient was maintained on statin therapy.  5. Sinus bradycardia:  The patient did have asymptomatic bradycardia      at times.  His Coreg was held with parameters.  It was noted that      his outpatient dose of Coreg was actually doubled in the hospital.      We will resume his home dose at discharge.  He is to follow up with      his regular outpatient MD for ongoing blood pressure and vital sign      checks.   DISPOSITION:  The patient is medically stable.  Followup has been  arranged with his ear, nose, and throat specialist.      Hillery Aldo, M.D.  Electronically Signed     CR/MEDQ  D:  03/29/2008  T:  03/30/2008  Job:  [id number]   cc:   Delaney Meigs, M.D.  Kristine Garbe. Ezzard Standing, M.D.

## 2011-01-16 NOTE — Discharge Summary (Signed)
[name], [name]             ACCOUNT NO.:  [id number]   MEDICAL RECORD NO.:  [id number]          PATIENT TYPE:  INP   LOCATION:  A326                          FACILITY:  [facility]   PHYSICIAN:  Dorris Singh, DO    DATE OF BIRTH:  23-Sep-1965   DATE OF ADMISSION:  04/22/2008  DATE OF DISCHARGE:  08/24/2009LH                               DISCHARGE SUMMARY   ADMISSION DIAGNOSES:  1. Congestive heart failure.  2. Anemia.  3. Renal insufficiency.  4. Hypertensive crisis.   DISCHARGE DIAGNOSES:  1. Diastolic and systolic congestive heart failure.  2. Renal insufficiency.  3. Anemia.  4. Hypertension.   TESTING DATA:  Includes a 2-D echocardiogram which was done on April 23, 2008, which demonstrated the following:  Summary:  LVEF was  depressed by approximately 25% with diffuse hypokinesis, apical  akinesis, posterior akinesis, proximal mid-inferior akinesis and left  ventricle mildly dilated.  Left ventricle wall thickness was moderately  increased.  There is a mild thickening of the mitral valve.  There was a  moderate mitral annular calcification.  There was a mild mitral valvular  regurgitation.  Affected orifice of mitral regurgitation is proximal  isovelocity surface area.  The volume of mitral regurgitation by  proximal isovelocity surface area was 13.  The left atrium was markedly  dilated, and the right ventricular systolic function was markedly  reduced.  Other testing includes that on April 23, 2008, he had a chest  x-ray which showed a massive cardiomegaly again noted.  There has been  worsening of asymmetric airspace disease, right greater than left.  On  April 25, 2008, he had a portable chest x-ray which demonstrated  persistent cardiomegaly, interval __________ edema or infiltrate since  prior exam.   His history and physical was done by Dr. Dorris Singh.  Please refer.   The patient is a 45 year old male who presented to the emergency room  with a chief  complaint of abdominal pain and cough.  It was found that  his x-ray showed that he was actually in CHF at that point in time and  also that his blood pressure was elevated.  He was admitted for the  above diagnoses to the service of [facility].  It was determined that we  would admit him to the ICU and put him on a nitrogen drip.  In addition,  a 2-D echocardiogram was ordered.  He had had a 2-D echocardiogram done  in May of 2009 in which his ejection fraction was 40% to 45%.  Another  one was ordered with the above results.  We also consulted his primary  cardiologist who is Dr. Dietrich Pates, and he was placed on DVT and GI  prophylaxis.  While he was in the ICU, there was a continual effort to  try to wean him off the nitro drip.  Cardiology saw him, increased his  Lasix and increased his hydralazine as well.  Patient was then weaned  off the nitro drip on April 25, 2008.  We also adjusted his medications  because of his hypotension, but as his blood pressure started to  increase, we then adjusted them back.  Cardiology saw patient today on  April 26, 2008, and it was determined that he could be discharged to  home with the adjustment to the following medications which will be  reported shortly.  We also discussed with patient while he was here his  issues with noncompliance of his medications.  He states that it is  financial issue.  He also is looking for disability because he is having  a hard time with work and having episodes of shortness of breath which  is understandable with his ejection fraction as low as it is.  He was  given a list of medications that were covered by [employer], and about 3  of the 4 medications that he currently is taking are on their $4.00  list.  Hopefully, with this knowledge, it will help him be less  noncompliant.  We also discussed with patient that he should not smoke  any illicit drugs including marijuana or being around anybody who does  that because of  the status of his heart being in such a weak condition.   He will be discharged on the following medications:  1. Norvasc 10 mg 1 p.o. daily.  2. Lasix 40 mg 1 p.o. b.i.d.  3. Clonidine 0.3 mg b.i.d.  4. Coreg 25 mg 1/2 tab b.i.d.  5. Aspirin 325 mg p.o. daily.  6. Simvastatin 20 mg nightly.  7. Hydralazine will be 50 mg 1 tab q.i.d.  8. Nu-Iron 150 mg b.i.d.   PHYSICAL EXAMINATION:  VITAL SIGNS FOR TODAY:  Temperature 97.8, pulse  53, respirations 16, blood pressure 114/74.  GENERALLY:  Patient is a 45 year old African American male who is well  developed, well nourished, in no distress.  HEART:  Regular rate and rhythm.  LUNGS:  Clear to auscultation bilaterally.  ABDOMEN:  Soft, nontender, nondistended.  EXTREMITIES:  Positive pulses.  No edema, ecchymosis or cyanosis.   LABORATORIES ON DATE OF DISCHARGE:  White count 6.7, hemoglobin 12.4,  hematocrit 38.2, platelet count of 361.  Sodium is 138, potassium 3.5,  chloride 102, CO2 of 24, glucose 127, BUN 33, creatinine 2.25 with a BNP  of 492.   It is recommended that the patient follow up with Dr. Dietrich Pates and Dr.  Antoine Poche on May 19, 2008, and he will take medications as  recommended.  Heart-healthy diet.  Increase activity slowly.  We will  have him talk with social services for information on disability.   Discharge time:  I spent greater than 30 minutes on this.      Dorris Singh, DO  Electronically Signed     CB/MEDQ  D:  04/26/2008  T:  04/26/2008  Job:  [id number]

## 2011-01-16 NOTE — Group Therapy Note (Signed)
[name], RETANA NO.:  [id number]   MEDICAL RECORD NO.:  [id number]          PATIENT TYPE:  INP   LOCATION:  IC03                          FACILITY:  [facility]   PHYSICIAN:  Dorris Singh, DO    DATE OF BIRTH:  [birth year]   DATE OF PROCEDURE:  10/06/2008  DATE OF DISCHARGE:                                 PROGRESS NOTE   The patient was admitted to the service of [facility].  He was admitted  and discharged to the floor.  He started having chest pain was moved  back to the emergency room and back to the ICU.  Cardiology has been  consulted.  He also had an echo done today.  He states that now he is  chest pain free.  However, we have been titrating his nitro drip.  Every  time we tried to turn down the nitro drip, he does complain of chest  pain.   PHYSICAL EXAM:  VITAL SIGNS:  Blood pressure 111/28.  Temperature 96,  pulse 62, respirations 21.  GENERAL:  The patient is a known patient to [place].  He is in no acute  distress.  HEART:  Regular rate and rhythm.  LUNGS:  Clear to auscultation bilaterally.  ABDOMEN:  Soft, nontender.  Nondistended.  EXTREMITIES:  Positive pulses.  No edema, ecchymosis or cyanosis.   LABS:  White count of 5.3, hemoglobin 10.5, hematocrit 31.4, platelet  count 202,000.  Heparin level 0.39, potassium, sodium is 132, potassium  3.9, chloride 99, CO2 27, glucose 117, BUN 29, creatinine 2.37, calcium  7.9.  His troponins have gone up from yesterday at 1.10 to 1.56 and  1.35.   ASSESSMENT/PLAN:  1. Cardiac injury.  Due to elevating troponin, [facility] has      been consulted.  An echo has been done, the results are pending.  2. Hypertensive emergency.  The patient is on nitro drip.  We are      monitoring his blood pressure and continuing to adjust as      necessary.  3. Chronic chest pain.  The patient did have a D-dimer done.  His VQ      scan was negative so it is not due to a pulmonary embolus.  Will      continue to  monitor.  Await any further recommendations that      Cardiology may have for the patient.      Dorris Singh, DO  Electronically Signed     CB/MEDQ  D:  10/06/2008  T:  10/06/2008  Job:  [id number]

## 2011-01-16 NOTE — H&P (Signed)
NAME:  [name], [name] NO.:  [id number]   MEDICAL RECORD NO.:  [id number]          PATIENT TYPE:  INP   LOCATION:  1825                         FACILITY:  [facility]   PHYSICIAN:  Luis Abed, MD, FACCDATE OF BIRTH:  1966-04-10   DATE OF ADMISSION:  01/07/2008  DATE OF DISCHARGE:                              HISTORY & PHYSICAL   SUMMARY OF HISTORY:  [name] is a 45 year old African-American male  who is transported via [facility] to [facility]  secondary to acute onset of shortness of breath and chest discomfort.  [name] stated he woke up at approximately 1:30 with acute  shortness of breath, anterior chest discomfort, which he described as a  heart wall squeezing and tight sensation.  He denied associated  radiation, nausea, vomiting, or diaphoresis.  He did note the headache.  He stated that he turned on the fan, then went outside several times  without improvement of his symptoms.  Eventually, he called EMS.  [facility] arrived at approximately 0408.  He was noted to be  in sinus tachycardia with a blood pressure of 220/140 and a sat of 88%.  They provided oxygen by nasal cannula, but the patient could not  tolerate a nonrebreather.  They also administered sublingual  nitroglycerin x3, aspirin, 4 mg of Morphine, 7.5 mg of labetalol, and 60  mg of Lasix with improvement of his symptoms.  At [facility], he also  received IV nitroglycerin, sublingual nitroglycerin, and 20 more  milligrams of IV labetalol for blood pressure of 201/136.   This is a very similar to his admission to [facility] in February 2009.  He also states he started running out of his prescriptions last week,  and by yesterday afternoon, he did not have any medications left.  He is  not clear to what medications he ran out of and when.   ALLERGIES:  No known drug allergies.   MEDICATIONS:  Medications that he was supposed to be taking include,  1.  Lisinopril 40 mg b.i.d.  2. Clonidine 0.3 mg b.i.d.  3. Hydralazine 25 mg daily.  4. Imdur 60 mg daily.  5. Lasix 40 mg b.i.d.  6. Simvastatin 80 mg nightly.  7. Coreg 25 daily.  8. Norvasc 10 daily.  9. Nitroglycerin 0.4.  10.Aspirin 325 mg daily.  The patient did bring his bottles to the      hospital and all bottles are empty.   PAST MEDICAL HISTORY:  Notable for hypertension, which he does not check  at home, medication noncompliance.  He has a history of anemia with  epistaxis associated with myocardial infarction given his hemoglobin was  5 requiring transfusion, ENT evaluation, and GI evaluation.  Catheterization on 12/18/2006 showed a 50% mid circ, tandem 80/80%  proximal RCA lesion with 90% mid RCA lesion followed by a total RCA,  left to right collaterals.  Echo on 12/16/2006 showed an EF at 20-30%,  marked LVH, decreased RV function, and left atrial enlargement.  He has  also had a problem with pneumonia in the past.  He has chronic  renal  insufficiency, and in February 2009, BUN and creatinine was 26 and 2.29,  and in April 2008, BUN and creatinine was 28 and 1.96.  He has reported  that he has seen a kidney doctor in the past, but this is not clear as  to when or which one as no notes are available.  He has a history of  hyperlipidemia, last checked in February 2009 showed total cholesterol  of 167, triglycerides 76, HDL 36, and LDL 116.   SOCIAL HISTORY:  He resides in [place] with his sister.  He is divorced.  He has 6 children.  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  He does continue to smoke, but  he states he only smokes a couple of cigarettes a week.  He denies  alcohol or drug use.  However, he does have a history of marijuana use  and tested positive in February 2009.  He states that occasionally his  exercise consists of walking to work.  He does try to maintain a low-  sodium diet.   FAMILY HISTORY:  Mother is deceased with CVA and hypertension.  He does  not know the  status of his father.  He has 3 brothers and 1 sister, he  does not know their health issues.   REVIEW OF SYSTEMS:  In addition to the above, is notable for associated  headache, dyspnea on exertion today, orthopnea, PND, coughing, and  wheezing this morning.  All other systems are unremarkable.   PHYSICAL EXAMINATION:  GENERAL:  Well-nourished, well-developed,  pleasant African-American male in no apparent distress.  VITAL SIGNS:  Currently, blood pressure is 189/133 and he has had  approximately 1400 mL of urine out, 99% sats on 2 liters, temperature  97.8, pulse 86, respirations 20.  HEENT:  Unremarkable.  NECK:  Supple without thyromegaly, adenopathy, or carotid bruits.  He  does have significant JVD.  HEART:  PMI is nondisplaced.  Distal heart sounds.  Regular rate and  rhythm.  At this time, I do not appreciate any rubs, clicks, or gallops.  He does have a soft 1/6 systolic murmur best appreciated at upper left  sternal border.  All pulses are symmetrical and intact.  I do not  appreciate any abdominal or femoral bruits.  LUNGS:  Symmetrical excursion.  He has diminished breath sounds  throughout with some rales in the bases.  SKIN AND INTEGUMENTARY:  Appears to be intact.  He has multiple tattoos.  ABDOMEN:  Slightly round.  Bowel sounds are present without  organomegaly, masses, or tenderness.  EXTREMITIES:  Negative cyanosis, clubbing, or edema.  MUSCULOSKELETAL:  Nerves are unremarkable.   Chest x-ray showed cardiomegaly, interval development of diffuse  interstitial airspace disease consistent with CHF.  EKG shows normal  sinus rhythm.  Normal intervals.  LVH, bilateral atrial enlargements,  some J-point elevation.  Old EKGs are not available for comparison.   LABORATORY DATA:  H&H is 13.4 and 39.3, normal indices, platelets 247,  WBC is 13.6.  Sodium by i-STAT was 140, potassium 3.8, BUN 24,  creatinine 2.5, glucose 108.  Pertinent care marker negative x1.    IMPRESSION:  1. Hypertensive urgency.  2. Congestive heart failure, primarily diastolic, but probable also      systolic component secondary to hypertensive urgency.  3. Acute coronary syndrome with underlying coronary artery disease      exacerbated by hypertensive urgency.  4. Noncompliance with prescriptions, which is a primary issue.  5. Tobacco use history as noted  previously.   DISPOSITION:  Dr. Myrtis Ser reviewed the patient history, spoke with and  examined.  The patient agrees with the above.  We will admit him to a  Step-Down unit and resume his home medications, particularly clonidine  given potential rebound and it is not clear how long he has not being  taking this medication.  We will gradually try to get  his blood pressure down to 160/90 today.  We will begin DVT prophylaxis,  we will have case manager assist with education, possible financial  resources, and medication resources, so hopefully, we can prevent future  hospital admissions.  He needs extensive education.  Further  recommendations will be based on response and findings.      Joellyn Rued, PA-C      Luis Abed, MD, [facility]  Electronically Signed    EW/MEDQ  D:  01/07/2008  T:  01/07/2008  Job:  [id number]   cc:   Delaney Meigs, M.D.  Rollene Rotunda, MD, [facility]

## 2011-01-16 NOTE — Group Therapy Note (Signed)
NAMERAINE, ELSASS NO.:  [id number]   MEDICAL RECORD NO.:  [id number]          PATIENT TYPE:  INP   LOCATION:  A216                          FACILITY:  [facility]   PHYSICIAN:  Dorris Singh, DO    DATE OF BIRTH:  Apr 30, 1966   DATE OF PROCEDURE:  10/08/2007  DATE OF DISCHARGE:                                 PROGRESS NOTE   The patient is seen today resting comfortably in bed.  His blood  pressure is still elevated.  He denies any episodes of chest pain.  The  patient has a history of medical noncompliance with medications.  He has  currently been seen by a kidney specialist in [place] as well as  [facility] in [place].  I discussed the case with Theron Arista C.  Eden Emms, MD, [facility] and he does not believe this is any type of ischemic  cardiomyopathy, however, the patient does have a history of nonischemic  cardiomyopathy, so we will continue to follow him and try to correct his  blood pressure to get it normalized.  I explained to the patient in  detail today about the importance of keeping his blood pressure  controlled.  We will also consider cost when discharging him on  medications that he can afford.  We will plan on discharging him  tomorrow as long as he continues to improve.  His temperature is 97.4,  pulse 72, respirations 18, blood pressure 153/99.  His pulse oximetry is  98% on room air.   PHYSICAL EXAMINATION:  GENERAL:  Generally this is a 45 year old African-  American male who is well-developed, well-nourished, in no acute  distress.  HEART:  Regular rate and rhythm.  LUNGS:  Clear to auscultation bilaterally.  ABDOMEN:  Soft, nontender, and nondistended.  EXTREMITIES:  Positive pulses, no ecchymosis, edema, or cyanosis.   LABORATORY DATA:  His CBC is within normal limits except for his  hematocrit being 38.2.  His INR is 1.2.  Sodium 136, potassium 3.0,  chloride 99, CO2 27, glucose 101, BUN 29, and creatinine 2.39.   ASSESSMENT:  1.  Chest pain.  The patient is not having anymore episodes of chest      pain.  2. Hypertension.  Currently he is on p.o. medications.  We will start      him on Hydralazine 25 mg today and see how that controls his blood      pressure overnight and if so we will send him home on medications      that the patient can afford that are generic.  3. Renal insufficiency.  He is currently being followed by Jorja Loa, M.D. who is monitoring his BMET.  4. Hypocalcemia.  We will go ahead and replace his potassium if it has      not already been done by cardiology.  5. Coronary artery disease.  We will continue to follow this as well.  6. Congestive heart failure.  Even though his BNP is elevated,      cardiology does not believe this is a CHF picture at this point in  time.  We will continue to monitor the patient and make any changes      as necessary.  Anticipate discharge in one day.      Dorris Singh, DO  Electronically Signed     CB/MEDQ  D:  10/08/2007  T:  10/08/2007  Job:  [id number]

## 2011-01-16 NOTE — H&P (Signed)
NAMESTEPHANOS, [name] NO.:  [id number]   MEDICAL RECORD NO.:  [id number]         PATIENT TYPE:  CINP   LOCATION:                               FACILITY:  [facility]   PHYSICIAN:  Rollene Rotunda, MD, FACCDATE OF BIRTH:  1966-06-14   DATE OF ADMISSION:  05/19/2008  DATE OF DISCHARGE:                              HISTORY & PHYSICAL   REASON FOR PRESENTATION:  Evaluate patient with hypertensive urgency.   HISTORY OF PRESENT ILLNESS:  The patient is a 45 year old African  American gentleman with cardiomyopathy and very difficult to control  hypertension.  He was last admitted at [facility] on August 20 with  chest pain and shortness of breath.  He had hypertensive urgency.  He  was discharged on August 24.  He was seen by cardiology in consultation.  His evaluation included an echocardiogram demonstrating his EF to be 25%  with inferior akinesis.  He was managed with nitroglycerin drip for his  hypertension.  He was discharged on the medications as listed below.  He  says he has been compliant with his medications.  He says he has been  under stress.  A great aunt is ill and close to death.  He came in today  for routine follow-up and was noted to have blood pressure of 220/160.  We tried clonidine totaling 0.4 mg and did not see his blood pressure  budge.  Because of this, he is being admitted.   The patient has had progressive dyspnea with exertion but no acute  resting complaints since discharge.  He does not describe any PND or  orthopnea.  He is not having any chest discomfort, neck or arm  discomfort.  He has had no neurologic complaints.  He has no blurred  vision, speech or motor deficits.  He has had no palpitations,  presyncope or syncope.  Again, he says he has had compliance with his  medications.   PAST MEDICAL HISTORY:  1. 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  2. Hypertension.  3. Hyperlipidemia.  4. Chronic renal insufficiency  (baseline creatinine about 2.3).  5. Coronary artery disease (April 2008 non-ST elevation myocardial      infarction.  Catheterization demonstrated 80% tandem lesions in the      right coronary artery and then an occluded mid right.).  6. Previous tobacco abuse.  7. Epistaxis with acute blood loss.  8. Pneumonia.  9. Previous marijuana abuse.   ALLERGIES/INTOLERANCES:  None.   MEDICATIONS:  (At discharge from the hospital):  1. Norvasc 10 mg daily.  2. Lasix 40 mg b.i.d.  3. Clonidine 0.3 mg b.i.d.  4. Carvedilol 12.5 mg b.i.d.  5. Aspirin 325 mg daily.  6. Simvastatin 20 mg daily.  7. Hydralazine 50 mg q.i.d.  8. Nu-Iron.   SOCIAL HISTORY:  The patient lives locally.  He is an ex-smoker.  He  does not drink alcohol.  He does use marijuana.   FAMILY HISTORY:  Is contributory for his mother dying with complications  of diabetes and hypertension.   REVIEW OF SYSTEMS:  As stated in the HPI and negative for all  other  systems.   PHYSICAL EXAMINATION:  The patient is in no distress.  Blood pressure  202/168, heart rate 76 and regular.  HEENT:  Eyelids unremarkable; pupils equal, round and reactive to light;  fundi within normal limits, though the optic disks were not well  visualized.  Oral mucosa:  Poor dentition but otherwise unremarkable.  NECK:  No jugular venous distention at 45 degrees; carotid upstroke  brisk and symmetric; no bruits; no thyromegaly.  LYMPHATICS:  No cervical, axillary, inguinal adenopathy.  LUNGS:  Clear to auscultation bilaterally.  BACK:  No costovertebral angle tenderness.  CHEST:  Unremarkable.  HEART:  PMI is broadly sustained, S1-S2 within normal limits, no S3, no  S4, no clicks, no rubs, no murmurs.  ABDOMEN:  Flat, positive bowel sounds, normal in frequency and pitch, no  bruits, no rebound, no guarding, no midline pulsatile mass, no  hepatomegaly, no splenomegaly.  SKIN:  No rashes, no nodules.  EXTREMITIES:  2+ pulses throughout, no edema,  no cyanosis, no clubbing.  NEUROLOGICAL:  Oriented to person place, and time; cranial nerves II-XII  grossly intact; motor grossly intact.   EKG:  Sinus rhythm, rate 90, left ventricle hypertrophy with  repolarization changes.   ASSESSMENT AND PLAN:  1. Hypertensive urgency.  The patient's blood pressures again      extremely elevated.  He says he is compliant with his medications.      I wonder if that is correct.  It is a complicated regimen.  I am      going to put him in the hospital and use IV nitroglycerin and IV      metoprolol.  I have called our on-call nurse practitioner to follow      up once he gets to the hospital, as he may need his medications      further adjusted.  Will need to look at his regimen.  I am not sure      why he is not on a beta blocker long-term.  2. Cardiomyopathy.  He is having Class II symptoms but no resting      symptoms at this point.  He seems to be euvolemic.  Will continue      salt and fluid restriction and his previous diuretics.  3. Coronary disease, having no ongoing angina.  Will continue risk      reduction.      Rollene Rotunda, MD, [facility]  Electronically Signed     JH/MEDQ  D:  05/19/2008  T:  05/19/2008  Job:  [id number]

## 2011-01-16 NOTE — Group Therapy Note (Signed)
[name], [name]             ACCOUNT NO.:  [id number]   MEDICAL RECORD NO.:  [id number]          PATIENT TYPE:  INP   LOCATION:  IC07                          FACILITY:  [facility]   PHYSICIAN:  Dorris Singh, DO    DATE OF BIRTH:  1966-02-18   DATE OF PROCEDURE:  04/25/2008  DATE OF DISCHARGE:                                 PROGRESS NOTE   The patient is seen today resting comfortably in bed.  States he is  feeling better.  We will go ahead and transfer him out of the ICU today.  His blood pressure has been under control and he has been off the nitro  drip for 24 hours and has been just on p.o. medications.  We will  continue those as well.   PHYSICAL EXAMINATION:  VITAL SIGNS:  His vitals are 60, 147/94 and 20.  GENERAL:  This is a 45 year old male who is well-developed, well-  nourished and in no acute distress.  HEART:  Regular rate and rhythm.  LUNGS:  Decreased breath sounds with crackles at the bases.  Very mild.  ABDOMEN:  Soft, nontender, nondistended.  EXTREMITIES:  No edema, ecchymosis or cyanosis noted.   LABS:  For today white count 8.2, hemoglobin 11.7, hematocrit 35.0 and  platelet count of 354.  His sodium is 140, potassium is 3.2, chloride  101, CO2 29, glucose 118, BUN 35, creatinine 3.2.  BNP is 418 and  calcium is 83.   ASSESSMENT AND PLAN:  1. Congestive heart failure.  This seems to be trending down.  Will      continue with current therapy.  2. Hypokalemia.  Will replace that as per previous orders.  3. Anemia.  Will continue to monitor this.  It could be anemia of      chronic disease.   Have discussed with the patient regarding his noncompliance with  medications.  The nurse has actually given him a list of [employer]  medications that are for 4 dollars, 3 of his meds that he is currently  on are on that.  He could spend 12 dollars a month or 30 dollars every 3  months for his medications.  Will continue to monitor the patient.  He  will also be  transferred out of the ICU to general medicine floor to  telemetry.      Dorris Singh, DO  Electronically Signed     CB/MEDQ  D:  04/25/2008  T:  04/25/2008  Job:  [id number]

## 2011-01-16 NOTE — Discharge Summary (Signed)
NAMEBRIAR, [name] NO.:  [id number]   MEDICAL RECORD NO.:  [id number]          PATIENT TYPE:  INP   LOCATION:  2011                         FACILITY:  [facility]   PHYSICIAN:  Rollene Rotunda, MD, FACCDATE OF BIRTH:  03-24-1966   DATE OF ADMISSION:  01/07/2008  DATE OF DISCHARGE:  01/09/2008                               DISCHARGE SUMMARY   PRIMARY CARDIOLOGIST:  Rollene Rotunda, MD, [facility] at the [facility] office.   PRIMARY CARE PHYSICIAN:  Delaney Meigs, MD in [place].   PROCEDURES PERFORMED DURING HOSPITALIZATION:  A 2-D echocardiogram dated  Jan 07, 2008.   FINAL DISCHARGE DIAGNOSES:  1. Hypertension, not well controlled.  2. Diastolic congestive heart failure.  3. Acute coronary syndrome.  4. Bradycardia, asymptomatic.  5. Ongoing tobacco abuse.  6. Noncompliance.   HOSPITAL COURSE:  This is a 45 year old African American male who was  transferred to our practice at [facility] via [facility]  secondary to chest discomfort, hypertension, and shortness of breath.  He described it as a squeezing tightness in his chest with no radiation,  nausea, vomiting, or diaphoresis.  The patient admitted running out of  his medications last week and did not refill them through pharmacy.  The  patient has had a recent admission in February 2009 at [facility] for  similar symptoms and medical noncompliance.  The patient was seen and  examined by Dr. Willa Rough on the day of admission and was admitted.  The patient was restarted on his medications that he should have been on  prior to admission to include clonidine and labetalol initially to get  his blood pressure better controlled on his admission, and on admission,  his blood pressure was 220/140.  After treatment in the emergency room,  they began to taper it down with a blood pressure of 189/133.  He was  given IV diuresis as well and placed on DVT prophylaxis.  The patient  was monitored through the next 2  days by Dr. Antoine Poche and blood pressure  did come down markedly with a blood pressure of 147/97 the following  day.  The patient's BUN and creatinine remained stable, but mildly  elevated with a creatinine of 2.4.  The patient did have a BNP of 2410  and was diuresed with IV Lasix.  On the day of discharge, the patient  was changed to p.o. Lasix 40 mg twice a day.  His hydralazine, which he  was started on during hospitalization, was adjusted beginning at 25 mg  t.i.d. and changed to 50 mg b.i.d.  The patient was also resumed on his  clonidine.  This is a difficult situation as the patient has a history  of noncompliance and may have rebound hypertension again should he stop  using.  The patient again has a history of medical noncompliance.  On  the day of discharge, the patient's blood pressure was 139/86, pulse 72,  O2 sat 93% on room air, and temperature 97.6.   DISCHARGE LABS:  Sodium 141, potassium 3.8, chloride 102, CO2 of 31, BUN  29, and creatinine 2.45.  The patient was found to be stable during the day of discharge and will  follow up with Dr. Antoine Poche in his office in [place] in approximately 2  weeks.   DISCHARGE MEDICATIONS:  1. Norvasc 10 mg daily.  2. Lasix 40 mg twice a day.  3. Clonidine 0.3 mg twice a day.  4. Coreg 2.5 mg twice a day.  5. Aspirin 325 daily.  6. Simvastatin 20 mg at bedtime.  7. Hydralazine 50 mg 3 times a day.   ALLERGIES:  No known drug allergies.   FOLLOWUP PLANS AND APPOINTMENTS:  1. The patient is to follow up with Dr. Antoine Poche in his [place] office      on February 18, 2008, at 02:15.  2. The patient has been given smoking cessation instructions.  3. The patient has been advised to continue medical compliance.  4. The patient is to follow up with his primary care physician for      continued medical management.   Time spent with the patient to include physician time 35 minutes.      Bettey Mare. Lyman Bishop, NP      Rollene Rotunda,  MD, [facility]  Electronically Signed    KML/MEDQ  D:  01/09/2008  T:  01/10/2008  Job:  [id number]   cc:   Delaney Meigs, M.D.

## 2011-01-16 NOTE — Group Therapy Note (Signed)
NAMELOGAN, [name]             ACCOUNT NO.:  [id number]   MEDICAL RECORD NO.:  [id number]          PATIENT TYPE:  INP   LOCATION:  IC06                          FACILITY:  [facility]   PHYSICIAN:  Osvaldo Shipper, MD     DATE OF BIRTH:  11-16-1965   DATE OF PROCEDURE:  10/05/2008  DATE OF DISCHARGE:                                 PROGRESS NOTE   SUBJECTIVE:  Subjectively, the patient is complaining of feeling hot and  cold and sweating quite a bit.  He also is complaining of a cough which  is dry but causes sharp chest pains.  Does not feel very short of  breath.  He is not coughing up anything.   OBJECTIVE:  VITAL SIGNS:  Temperature 99.7, prior to that 99.6.  He has  been having low grade elevation in his temperatures over the past 24  hours, heart rate in the 60s, respiratory rate is 28, saturation 99% on  room air blood pressure is significantly improved.  The last reading  recorded is 117/76.  He has been off of the nitroglycerin drip since  10:00 p.m. last night.  LUNGS:  Clear to auscultation bilaterally.  No wheezing, rales or  rhonchi.  CARDIOVASCULAR:  S1, S2 is normal regular.  No murmurs appreciated.  No  S3-S4, no rubs, no bruits.  ABDOMEN:  Soft, nontender, nondistended.  Bowel sounds present.  No  masses or organomegaly appreciated.  EXTREMITIES:  Show no edema.   LABORATORY DATA:  CBC is stable with normal white count.  B-met shows a  sodium 131, glucose 120, BUN is 33, creatinine is 2.6, just slightly  improved from yesterday.  Troponin is mildly elevated and also trending  down.  Urine drug screen positive for opiates and marijuana.   ASSESSMENT/PLAN:  1. Hypertensive emergency, improved.  He is just on p.o. medications      now.  He can be transferred to the floor.  2. Elevated troponins, likely result of #1.  We will continue to      follow him closely.  No further evaluation necessary at this time.  3. Low grade fever.  Chest x-ray did not show any pneumonia.   Blood      cultures will be sent.  He will be continued on Levaquin.  4. Pleuritic chest pain.  Will check a D-dimer and proceed with VQ      scan if positive.  5. Acute on chronic kidney disease, stable.  6. Headache, improved.   We will transfer him from the unit to the floor today, hopefully, he can  go home in the next couple of days.      Osvaldo Shipper, MD  Electronically Signed     GK/MEDQ  D:  10/05/2008  T:  10/05/2008  Job:  [id number]   cc:   Health Department

## 2011-01-16 NOTE — Discharge Summary (Signed)
NAMEJAMIERE, [name]             ACCOUNT NO.:  [id number]   MEDICAL RECORD NO.:  [id number]          PATIENT TYPE:  INP   LOCATION:  IC07                          FACILITY:  [facility]   PHYSICIAN:  Osvaldo Shipper, MD     DATE OF BIRTH:  October 30, 1965   DATE OF ADMISSION:  12/16/2008  DATE OF DISCHARGE:  04/18/2010LH                               DISCHARGE SUMMARY   The patient does not have a primary medical doctor.  He was followed on  an as needed basis by Dr. Dietrich Pates that is mainly because of the  patient's noncompliance.   DISCHARGE DIAGNOSES:  1. Hypertensive emergency improved.  2. Congestive heart failure exacerbation in the form of lower      extremity edema improved.  3. History of polysubstance abuse, counseled.  4. History of noncompliance.   BRIEF HOSPITAL COURSE:  Briefly, this is a 45 year old [place]  male who is known for being noncompliant who does not follow up with any  physician on a regular basis.  The patient presented to the ED with  complaints of chest pain and headache.  He was found to have blood  pressures in the 170s/120s.  The patient was experiencing photophobia.  He did not have any acute EKG findings.  The patient was also found to  have 2+ pitting edema in lower extremities.  He does have known  cardiomyopathy with an EF of 20%.  The patient was started on  intravenous nitroglycerin.  He was also given intravenous furosemide.  The patient was slow to respond to these medications.  He was found to  have marijuana in his urine.  The patient was subsequently started on  his home medications of hydralazine and carvedilol.  We also had to  start clonidine on him.  We have been reluctant to use clonidine in the  past because of his noncompliance and the risk of causing profound  rebound hypertension, but nothing else was working for him, so we had to  go ahead and initiate clonidine.  The patient has done well in the last  36-48 hours.  He has been  off his nitroglycerin.  His blood pressures  have been very well controlled.  In the last 12 hours, they have ranged  from 120 systolic to 140 systolic and diastolic has been in the 70s and  80s and I think, he has responded appropriately to treatment.   Regarding his lower extremity edema and CHF, the patient has been  diuresing quite well.  He has diuresed almost 15 L in the last 3 days.  His weight has gone down from 91 kg to 82 kg.   The patient also has chronic kidney disease with BUN of 28 and a  creatinine usually between 2 and 2.5 and this has been stable as well.  He was also hypokalemic during some part of this admission which was  repleted aggressively.   On the day of discharge, the patient is actually feeling quite well.  He  denies any headache or chest pain.  He feels as much improved.  His ins  and outs  shows that he was negative by 2700 mL yesterday.   PHYSICAL EXAMINATION:  VITAL SIGNS:  He has been afebrile.  His heart  rate is in the 50s, respiratory rate is 16, blood pressure 126/71, and  sats 99% on room air.  LUNGS:  Clear to auscultation bilaterally.  CARDIOVASCULAR:  S1 and S2 is normal and regular.  No murmurs.  No S3  and S4.  ABDOMEN:  Soft, nontender, and nondistended.  Bowel sounds are present.  EXTREMITIES:  Lower extremity edema is completely resolved.  NEUROLOGIC:  Alert and oriented x3.  No focal neurological deficits are  present.  NECK:  Soft and supple.  No hepatomegaly is appreciated.  SKIN:  No rash.   LABORATORY DATA:  Potassium is 3.6 this morning.   DISCHARGE MEDICATIONS:  1. Hydralazine 100 mg p.o. t.i.d.  2. Carvedilol 12.5 mg b.i.d.  3. Clonidine 0.1 mg t.i.d.  4. Lasix 80 mg once a day.  5. Potassium chloride 20 mEq once a day.  6. Aspirin 81 mg daily.  He used to be on Plavix; however, I would      prefer that the patient discuss the use of Plavix with his      cardiologist.   Followup with Dr. Dietrich Pates within the next 3-4  days.  I will call their  office and leave a message.  The patient has also been instructed to  call their office tomorrow that is Monday.   DIET:  Heart-healthy.   PHYSICAL ACTIVITY:  As before.  CHF instructions to be provided as well.   Total time on this discharge encounter, 35 minutes.      Osvaldo Shipper, MD  Electronically Signed     GK/MEDQ  D:  12/19/2008  T:  12/20/2008  Job:  [id number]   cc:   Gerrit Friends. Dietrich Pates, MD, [facility]  [address]  [place], [place] [address]

## 2011-01-16 NOTE — Group Therapy Note (Signed)
[name], [name]             ACCOUNT NO.:  [id number]   MEDICAL RECORD NO.:  [id number]          PATIENT TYPE:  INP   LOCATION:  IC09                          FACILITY:  [facility]   PHYSICIAN:  Margaretmary Dys, M.D.DATE OF BIRTH:  Nov 05, 1965   DATE OF PROCEDURE:  02/07/2008  DATE OF DISCHARGE:                                 PROGRESS NOTE   SUBJECTIVE:  The patient is feeling much better.  The patient was  admitted with hypertensive urgency and congestive heart failure.   OBJECTIVE:  GENERAL:  Conscious, alert, comfortable, not in acute  distress, well oriented to time, place and person.  VITAL SIGNS:  Blood pressure is now normal at 122/82 this morning.  Pulse is 82, respiratory rate of 16, temperature 97.5 degrees  Fahrenheit.  Oxygen saturation was 100% on 2 liters nasal cannula.  HEENT:  Normocephalic, atraumatic.  Oral mucosa moist with no exudate.  NECK:  Supple.  No JVD.  No lymphadenopathy.  LUNGS:  Clear clinically with good air entry bilaterally.  HEART:  S1, S2 regular.  No S3, S4, gallops or rubs.  ABDOMEN:  Soft.  Nontender.  Bowel sounds were positive.  EXTREMITIES:  No pitting pedal edema.   LABORATORY/DIAGNOSTIC DATA:  White blood cell count was 7.3, hemoglobin  12, hematocrit 35, platelet count 237 with no left shift.  Sodium 141,  potassium 3.3, chloride 105, CO2 32, glucose 87, BUN 29, creatinine  2.34, calcium 8.2, BNP was 1380.   ASSESSMENT:  1. Severe poorly controlled hypertension secondary to noncompliance.  2. Hypertensive urgency.  3. Congestive heart failure with flash pulmonary edema.  4. Coronary artery disease with severe disease in the right coronary      artery.   PLAN:  1. I will continue on diuretics with Lasix intravenously q.12 h.  2. The patient will be transferred to the medical floor and telemetry      will be discontinued.  3. Will continue on current antihypertensive regimen.  4. Nitroglycerin has been discontinued.  5. We will  observe the patient for another 24-48 hours in the hospital      prior to discharge.  6. Extensive discussions were held regarding drug abuse cessation and      also compliance to his medications.      Margaretmary Dys, M.D.  Electronically Signed     AM/MEDQ  D:  02/07/2008  T:  02/07/2008  Job:  [id number]

## 2011-01-16 NOTE — H&P (Signed)
[name], [name]             ACCOUNT NO.:  [id number]   MEDICAL RECORD NO.:  [id number]          PATIENT TYPE:  INP   LOCATION:  IC07                          FAC[facility]  [facility]   PHYSICIAN:  Osvaldo Shipper, MD     DATE OF BIRTH:  1966-06-01   DATE OF ADMISSION:  12/16/2008  DATE OF DISCHARGE:  LH                              HISTORY & PHYSICAL   The patient does not have a PMD.  He is followed by [facility],  specifically Dr. Dietrich Pates.  He was last seen in their office on March  24.   ADMITTING DIAGNOSES:  1. Hypertensive emergency.  2. Lower extremity edema, likely CHF.  3. Noncompliance.  4. History of polysubstance abuse.   CHIEF COMPLAINT:  Chest pain, headache, leg swelling since yesterday.   HISTORY OF PRESENT ILLNESS:  The patient is a 45 year old African  American male who is well known to our service for numerous admissions  in the past.  He is known for his noncompliance.  He states that he was  in his usual state of health until yesterday when he started developing  a headache, which was diffusely present all over his head, initially  6/10 in intensity worsening to 10/10 in intensity.  Stated he also had  symptoms of photophobia.  Denied any nausea, vomiting.  Then he started  having chest pain in the retrosternal area, 8/10 in intensity.  No  radiation.  No precipitating, aggravating or relieving factors.  Then  this morning when he woke up he noticed leg swelling bilaterally and he  got concerned and decided to come into the hospital.  He denies missing  any doses of his medications.  He mentions that he is only on two  medications at this time.  He denied any fever, chills or cough.  Denies  any focal weakness.   MEDICATIONS AT HOME:  According to him he take only:  1. Carvedilol 12.5 mg twice daily.  2. Hydralazine 100 mg 3 times daily.  3. He is also supposed to be on Imdur 60 mg daily, Plavix 75 mg daily      and aspirin 325 mg daily, but I do not  think he takes any of these.   ALLERGIES:  No known drug allergies.   PAST MEDICAL HISTORY:  Positive for:  1. Congestive heart failure with cardiomyopathy.  His EF is about 20%      based on an echo done in February of 2010.  2. He also has a history of poorly controlled hypertension mainly      because of noncompliance.  3. He also has a history of chronic kidney disease.  4. History of coronary artery disease.   SOCIAL HISTORY:  Admits to smoking, occasional alcohol use and illicit  drug use recently positive for marijuana.  Currently lives in [place].   FAMILY HISTORY:  Unremarkable.   REVIEW OF SYSTEMS:  GENERAL:  Positive for weakness, malaise.  HEENT:  Positive for headache.  CARDIOVASCULAR:  As in HPI.  RESPIRATORY:  As in  HPI.  GI:  Unremarkable.  GU:  Unremarkable.  NEUROLOGICAL:  As in HPI.  DERMATOLOGICAL:  Unremarkable.  PSYCHIATRIC:  Unremarkable.  MUSCULOSKELETAL:  Unremarkable.   PHYSICAL EXAMINATION:  VITAL SIGNS:  When he presented to the ED, his  temperature was 97.9, blood pressure was 176/126, heart rate 66,  respiratory rate 20, saturations 99% on room air.  He is currently on 40  mcg of nitroglycerin.  Blood pressure is still high at 176/128.  Heart  rate in the 60s.  Saturating quite well on 2 liters.  GENERAL:  Well-developed, well-nourished black male in no distress.  HEENT:  There is no pallor, no icterus.  Oral mucous membranes moist.  No oral lesions are noted.  NECK:  Soft, supple. No thyromegaly is appreciated.  LUNGS:  Clear to auscultation bilaterally.  No wheezing, rales or  rhonchi.  CARDIOVASCULAR:  S1, S2 normal, regular.  No S3, S4,  No rubs, no  bruits.  ABDOMEN:  Soft, nontender, nondistended.  Bowel sounds present.  No  nausea, organomegaly is appreciated.  EXTREMITIES:  2+ edema is noted bilateral lower extremities.  MUSCULOSKELETAL EXAM:  Unremarkable.  NEUROLOGICALLY:  He is alert, oriented x3.  Pupils are equal reacting.  No  cranial nerve deficit.  No motor deficits are noted.  Overall no  focal deficits are present.   LABORATORY DATA:  His white count is normal.  Hemoglobin is 12.2,  platelet count 241.  Electrolytes show glucose of 103, BUN is 28,  creatinine is 2.15 close to his baseline.  Cardiac enzymes are negative  x2.  BNP is greater than 3200.  Urine drug screen positive for  marijuana.  UA was negative for infection or protein.  Eh had a CT head  which showed no acute abnormalities.  Chest x-ray showed cardiomegaly  without frank edema.   EKG shows sinus rhythm with left axis deviation.  Intervals:  Actually  he may have prolonged QT.  Prominent T wave is noted.  No Q waves are  noted.  No other ST or T wave changes are noted.   ASSESSMENT:  This is a 45 year old African American male presents with  chest pain, headache, lower extremity edema.  He has high blood  pressure.  This is consistent with hypertensive emergency and also  possibly some degree of congestive heart failure exacerbation as the  cause of his lower extremity edema.   PLAN:  1. Hypertensive emergency.  He is on nitroglycerin drip which needs to      be titrated further to achieve blood pressure control.  He is on      his home medications of Hydralazine and carvedilol.  He is on      aspirin, Plavix.  2. Congestive heart failure exacerbation.  He is on intravenous Lasix      80 mg twice daily, which we will continue for now.  Check Ins and      Outs and daily weights will be checked.  No need to repeat another      echocardiogram.  If needed, Cardiology will be consulted but as of      now we will hold off.  3. Polysubstance abuse.  Counseling will be provided.  4. Deep vein thrombosis prophylaxis ongoing.  5. History of chronic kidney disease, stable.  This is stage 3.  6. Further management decisions will depend on results of further      testing and patient's response to treatment.   A total of 1 hour spent of critical  care time on this patient.  Osvaldo Shipper, MD  Electronically Signed     GK/MEDQ  D:  12/16/2008  T:  12/16/2008  Job:  [id number]

## 2011-01-16 NOTE — Discharge Summary (Signed)
[name], [name]             ACCOUNT NO.:  [id number]   MEDICAL RECORD NO.:  [id number]          PATIENT TYPE:  INP   LOCATION:  A322                          FACILITY:  [facility]   PHYSICIAN:  Osvaldo Shipper, MD     DATE OF BIRTH:  Apr 20, 1966   DATE OF ADMISSION:  06/09/2008  DATE OF DISCHARGE:  10/10/2009LH                               DISCHARGE SUMMARY   Please review H and P dictated at the time of admission for details  regarding the patient's presenting illness.   The patient is to make an appointment with [facility]  Medicine.  He is also to follow up with [facility].   DISCHARGE DIAGNOSES:  1. Hypertensive urgency with pulmonary edema, improved.  2. Poorly controlled hypertension.  3. Noncompliance.  4. Ischemic cardiomyopathy.  5. History of coronary artery disease, status post myocardial      infarction in the past.  6. History of chronic kidney disease.   BRIEF HOSPITAL COURSE:  Briefly, this is a 45 year old African American  male who presented to the hospital with complaints of shortness of  breath and headache.  He was evaluated in the ED.  He underwent chest x-  ray which showed evidence for pulmonary edema.  He also had extremely  elevated blood pressures with systolic greater than 200, diastolic  greater than 120s.  The patient mentioned that he had ran out of his  medications a few days prior to the presentation.  So the reason for his  illness was that he ran out of his medications, became hypertensive and  then went into flash pulmonary edema.  The patient was started on  nitroglycerin infusion and all of his home medications were restarted.  Over the course of the last 2-3 days, the patient's blood pressures have  improved.  He has been titrated off of the nitroglycerin and has been on  oral agents for the past 24-hours.  He also has chronic kidney disease.  He has a known history of ischemic cardiomyopathy with an EF of about 20-  40%.  He has had an MI in the past.  He also is anemic for which he is  taking iron pills.  He had mild elevation in his troponins up to 1.2,  which is thought to be secondary to his hypertensive urgency rather than  any acute coronary ischemia.  His total CK-MBs were all normal.  We  tried to avoid putting the patient back on clonidine as we felt that if  the patient was noncompliant, he would be at risk for rebound  hypertension.  However, we had a tough time controlling his blood  pressures with other medications and his BPs came down only after he was  started back on clonidine.  So, we are forced to continue with the  clonidine at this time.   On the day of discharge, the patient is feeling well.  He denies any  shortness of breath, no headache, no other complaints offered, no chest  pain.   PHYSICAL EXAMINATION:  VITAL SIGNS:  Show that he is afebrile, his other  vitals  signs are all stable.  His heart rate does tend to drop into the  50s at times, but he maintains his blood pressure.  Saturation 97% on  room air.  LUNGS:  Clear to auscultation bilaterally.  No wheezing, rales or  rhonchi.  CARDIOVASCULAR:  S1-S2.  Bradycardic.  Regular, no murmurs appreciated.  No S3, no S4, no rubs, no bruits.  ABDOMEN:  Soft, nontender, nondistended.  Bowel sounds are present.  No  mass or organomegaly appreciated.  EXTREMITIES:  Do not show any edema.  NEUROLOGIC:  He is alert and oriented x3.  No focal neurological  deficits are present.  MUSCULOSKELETAL:  Unremarkable.  GU:  Unremarkable.   Overall, he is stable for discharge.   DISCHARGE MEDICATIONS:  1. Labetalol 100 four times a day.  2. Amlodipine 10 mg daily.  3. Clonidine 0.2 mg b.i.d.  4. Aspirin 325 mg daily.  5. Lasix 40 mg b.i.d.  6. Simvastatin 20 mg daily.  7. Hydralazine 50 mg four times a day.  8. Nu-Iron 150 mg b.i.d.  9. Nitroglycerin sublingually as needed.   DIET:  Heart healthy.   PHYSICAL ACTIVITY:   Increase activity slowly.   FOLLOW UP:  As discussed above.   He also had an ultrasound of his kidneys done here which revealed no  hydronephrosis, kidneys were equal in size.  Medical renal disease was  noted.  A small tiny left nonobstructive renal calculus was questioned.   Total time of this discharge encounter about 35 minutes.      Osvaldo Shipper, MD  Electronically Signed     GK/MEDQ  D:  06/12/2008  T:  06/12/2008  Job:  [id number]   cc:   Gerrit Friends. Dietrich Pates, MD, [facility]  [address]  [place], [place] [address]

## 2011-01-16 NOTE — Assessment & Plan Note (Signed)
[name]                            CARDIOLOGY OFFICE NOTE   NAME:[name], [name]                      MRN:          [id number]  DATE:11/24/2008                            DOB:          08-11-1966    PRIMARY CARE PHYSICIAN:  None.   REASON FOR PRESENTATION:  Evaluate the patient with hypertension and  cardiomyopathy.   HISTORY OF PRESENT ILLNESS:  The patient finally shows up for an  appointment.  He has a habit of not coming.  He is 45 years old.  He was  last seen in the hospital on the 31st.  He had a hypertensive urgency,  non-Q-wave myocardial infarction, an exacerbation of heart failure.  These things were treated with med changes to include discontinuation of  clonidine and labetalol.  The clonidine was discontinued because of it  was thought he might have rebound hypertension if he miss this drug.  He  states he has been complying with medication since then.  However, his  blood pressure is very elevated as described.  He denies any shortness  of breath, PND, or orthopnea.  He has had no chest discomfort, neck or  arm discomfort.  He has had no palpitations, presyncope, or syncope.  He  gets his meds through Medicaid now, so he can afford them and says he  really runs out, though, he has empty bottles today.   PAST MEDICAL HISTORY:  Coronary artery disease (non-ST-elevation  myocardial infarction in 2008 in the setting of epistaxis and anemia.  Catheterization at that time demonstrated 50% mid LAD stenosis, the RCA  was diffusely diseased with 80% proximal stenosis from 3 locations and  90% mid disease with an ulcerated plaque, there was a marginal branch of  the right coronary that was occluded, the mid right coronary was totally  occluded with left to right collaterals.  This was managed medically),  cardiomyopathy (mixed ischemic and nonischemic EF 25%), hypertension,  hyperlipidemia, chronic renal insufficiency, anemia, and medical  noncompliance.   ALLERGIES/INTOLERANCES:  None.   MEDICATIONS:  1. Imdur 60 mg daily.  2. Coreg 6.25 mg b.i.d.  3. Plavix 75 mg daily.  4. Hydralazine 50 mg t.i.d.  5. Aspirin 325 mg daily.   REVIEW OF SYSTEMS:  As stated in the HPI and otherwise negative for all  other systems.   PHYSICAL EXAMINATION:  GENERAL:  The patient is in no distress.  VITAL SIGNS:  Blood pressure 208/142, heart rate 75 and regular, weight  192 pounds, and body mass index 29.  HEENT:  Eyelids unremarkable; pupils are equal, round, and reactive to  light; fundi not visualized; oral mucosa unremarkable, poor dentition.  NECK:  No jugular venous distention at 45 degrees, carotid upstroke  brisk and symmetric, no bruits, no thyromegaly.  LYMPHATICS:  No cervical, axillary, or inguinal adenopathy.  LUNGS:  Clear to auscultation bilaterally.  BACK:  No costovertebral angle tenderness.  CHEST:  Unremarkable.  HEART:  PMI not displaced or sustained; S1 and S2 within normal limits,  no S3, no S4; no clicks, no rubs, no murmurs.  ABDOMEN:  Flat; positive  bowel sounds, normal in frequency and pitch; no  bruits, no rebound, no guarding, no midline pulsatile mass; no  hepatomegaly, no splenomegaly.  SKIN:  No rashes, no nodules.  EXTREMITIES:  Pulses 2+ throughout, no edema, no cyanosis, no clubbing.  NEURO:  Oriented to person, place, and time; cranial nerves II through  XII grossly intact; motor grossly intact.   ASSESSMENT AND PLAN:  1. Hypertension.  The patient was kept in the office today, given      several doses of clonidine to bring his blood pressure down      (greater than 1-1/2 hours with this appointment).  He reports      taking his medications.  However, I am not sure I believe this.      His blood pressure is easily controlled in the hospital when he      takes his meds.  He has empty medicine bottles.  I have reiterated      to him that the fact that he is going to have a stroke or die if he       is not compliant with medications.  Today, I am going to increase      his hydralazine to 100 mg t.i.d.  His Coreg is going to be 12.5 mg      b.i.d.  He will continue on the other meds as listed.  He promises      he will come back in 2 weeks for blood pressure check.  2. Cardiomyopathy.  We are going to manage this in the context of      treating his hypertension.  He seems to have class I symptoms at      this point.  3. Coronary artery disease.  We will continue with the risk reduction.      He will continue on the aspirin.  4. Hyperlipidemia.  The patient was to be on simvastatin, but I do not      see this on his list.  We will reiterate that he should restart      this medication and we can check a lipid profile the weeks to come.  5. Followup.  We will see him back in 2 weeks for blood pressure      check.     Rollene Rotunda, MD, [facility]  Electronically Signed    JH/MedQ  DD: 11/24/2008  DT: 11/25/2008  Job #: [id number]

## 2011-01-16 NOTE — Progress Notes (Signed)
[facility]                        PERIPHERAL VASCULAR OFFICE NOTE   NAME:[name], [name]                      MRN:          [id number]  DATE:02/19/2007                            DOB:          [birth year]    PRIMARY CARE PHYSICIAN:  Delaney Meigs, M.D.   REASON FOR VISIT:  Evaluate patient with coronary artery disease and  cardiomyopathy.   HISTORY OF PRESENT ILLNESS:  The patient presents for followup after  hospitalization with a non-ST-segment elevation myocardial infarction.  He has since been seen in our office.  He has had very difficult to  control blood pressure.  He has had difficulty affording his  medications, although they are generic.  He says he has been taking  them.  He has seen a nephrologist for chronic renal insufficiency.  It  is unclear to me what transpired this morning, but apparently he had a  dose increase possibly on his lisinopril.  He did not understand the  instructions.  He was apparently noted to be hypertensive there.  He  denies any symptoms.  He has not been having any chest discomfort, neck  or arm discomfort.  He has not been having any palpitations, presyncope  or syncope.  He denies any PND or orthopnea.  He is not smoking  cigarettes.  He is not using any other illicit drugs.  He is noted to be  hypertensive as described.   PAST MEDICAL HISTORY:  1. Coronary artery disease (status post non-ST-segment elevation      myocardial infarction in April 2008, with occluded right coronary      artery with left to right collaterals).  2. Cardiomyopathy (diffuse, severe, left ventricular hypokinesis with      an EF of 20-30%, probably mixed ischemic, nonischemic).  3. Difficult to control hypertension.  4. Chronic renal insufficiency.  5. Epistaxis with acute blood loss anemia.  6. History of pneumonia.   ALLERGIES:  No known drug allergies.   MEDICATIONS:  1. Simvastatin 80 mg daily.  2. Carvedilol 12.5 mg  b.i.d.  3. Isosorbide 60 mg daily.  4. Lisinopril 10 mg daily.  5. Clonidine 0.1 mg b.i.d.   REVIEW OF SYSTEMS:  As stated in the HPI, otherwise negative for other  systems.   PHYSICAL EXAMINATION:  GENERAL:  The patient is in no acute distress.  VITAL SIGNS:  Blood pressure 228/140 (came down to 200/120 with  clonidine).  Weight 194 pounds, body mass index 29, heart rate 78.  HEENT:  Eyes unremarkable, pupils equal round and reactive to light and  fundi not visualized.  Oral mucosa unremarkable.  NECK:  No jugular venous distention.  Wave form within normal limits.  Carotid upstroke brisk and symmetric.  No bruits or thyromegaly.  LYMPHATICS:  No cervical, axillary or inguinal adenopathy.  LUNGS:  Clear to auscultation bilaterally.  BACK:  No costovertebral angle tenderness.  CHEST:  Unremarkable.  HEART:  PMI not displaced or sustained.  S1, S2 within normal limits, no  S3 or S4, no clicks, rubs or murmurs.  ABDOMEN:  Flat, positive bowel sounds, normal in frequency, pitch.  No  bruits, no rebound, no guarding or midline pulsatile masses.  No  hepatomegaly or splenomegaly.  SKIN:  No rashes.  EXTREMITIES:  2+ pulses throughout, no edema, no clubbing, cyanosis or  edema.  NEUROLOGIC:  Oriented to person, place and time.  Cranial nerves 2-12  grossly intact.  Motor grossly intact.   ASSESSMENT/PLAN:  1. Hypertension.  Blood pressure is elevated today.  He was given 0.2      mg of clonidine.  It had come down somewhat after 15 minutes.      Today, I am going to double his lisinopril to 20 mg, if he is      actually taking 10 mg.  We are going to try and clarify.  If he is      actually taking 30 mg, I am going to go to 40 mg.  He will take the      extra dose tonight.  I am also going to double his Carvedilol to 25      mg b.i.d.  He will take the double dose tonight.  2. Cardiomyopathy.  The patient is having Class I symptoms.  We are      going to manage this in the context of  uptitrating his medications      as described.  3. Coronary disease.  He will continue with secondary risk reduction.  4. Followup.  I will see him back in a couple of weeks for medication      titration.     Rollene Rotunda, MD, [facility] [place]  Electronically Signed    JH/MedQ  DD: 02/19/2007  DT: 02/20/2007  Job #: [id number]   cc:   Delaney Meigs, M.D.

## 2011-01-16 NOTE — Discharge Summary (Signed)
NAMEGRIFFEY, NICASIO NO.:  [id number]   MEDICAL RECORD NO.:  [id number]          PATIENT TYPE:  INP   LOCATION:  4737                         FACILITY:  [facility]   PHYSICIAN:  Rollene Rotunda, MD, FACCDATE OF BIRTH:  07-15-1966   DATE OF ADMISSION:  05/19/2008  DATE OF DISCHARGE:  05/21/2008                               DISCHARGE SUMMARY   PRIMARY CARDIOLOGIST:  Rollene Rotunda, MD, [facility]   DISCHARGE DIAGNOSIS:  Hypertensive urgency.   SECONDARY DIAGNOSES:  1. Mixed ischemic and nonischemic cardiomyopathy with previously      documented ejection fraction of 20%-30%.  2. Chronic systolic/diastolic congestive heart failure.  3. Hypertension.  4. Hyperlipidemia.  5. Chronic renal insufficiency with a baseline creatinine of 2.3.  6. Coronary artery disease status post non-ST elevation myocardial      infarction, April 2008.  7. Remote tobacco abuse.  8. Remote marijuana abuse.  9. History of epistaxis with acute blood loss.  10.History of pneumonia.   ALLERGIES:  No known drug allergies.   PROCEDURES:  None.   HISTORY OF PRESENT ILLNESS:  A 45 year old African American male with  the above problem list.  He was recently admitted to [facility], April 22, 2008, with complaints of chest pain and shortness  of breath as well as hypertensive urgency.  He was subsequently  discharged April 26, 2008.  An echocardiogram at that time showed an EF  of 25% with inferior akinesis.  The patient reports compliance with his  medication.  He saw Dr. Antoine Poche in the [facility] office on May 19, 2008, and was noted to be markedly hypertensive there with a blood  pressure of 202/168.  A decision was made to admit the patient for  hypertensive urgency.   HOSPITAL COURSE:  Following admission, Mr. Bollman was placed on IV  nitroglycerin as well as IV metoprolol.  We were able to achieve fair  blood pressure control and these were discontinued in favor of oral  labetalol.  Throughout his hospital course, he was maintained on his  home doses of hydralazine, Norvasc, and clonidine.  Today, Mr. Carmickle  blood pressure ranged from 115/81 to 145/106.  We have adjusted his  labetalol to 100 mg q.i.d. in addition to his home medications.  We have  planned to discharge him home today in good condition.  We have arranged  for followup BMET and renal artery ultrasound to rule out renal artery  stenosis, both of which is to take place on May 28, 2008, at 9  a.m. in our [place] office.   DISCHARGE LABORATORIES:  Sodium 137, potassium 3.5, chloride 102, CO2  27, BUN 27, creatinine 2.45, hemoglobin 10.4, hematocrit 32.5, WBC 7.5,  platelets 286, MCV 77.8, and calcium 8.1.   DISPOSITION:  The patient is being discharged home today in good  condition.   FOLLOWUP PLANS AND APPOINTMENTS:  As above.  He is scheduled for renal  artery ultrasound and BMET on May 28, 2008, at 9 a.m. in our  [place] office.  He is to follow up with Dr. Antoine Poche in [place], on  June 09, 2008, 10:30 a.m.   DISCHARGE MEDICATIONS:  1. Labetalol 100 mg q.i.d.  2. Amlodipine 10 mg daily.  3. Clonidine 0.3 mg b.i.d.  4. Aspirin 325 mg daily.  5. Lasix 40 mg b.i.d.  6. Simvastatin 20 mg nightly.  7. Hydralazine 50 mg q.i.d.  8. Nu-iron 150 mg b.i.d.  9. Nitroglycerin 0.4 mg sublingual p.r.n. chest pain.   OUTSTANDING LABORATORY STUDIES:  None.   DURATION OF DISCHARGE/ENCOUNTER:  Forty-five minutes including physician  time.      Nicolasa Ducking, ANP      Rollene Rotunda, MD, [facility]  Electronically Signed    CB/MEDQ  D:  05/21/2008  T:  05/22/2008  Job:  [id number]

## 2011-01-16 NOTE — Letter (Signed)
October 19, 2009    [facility]  [address] [place] [address]   RE:  [name], [name]  MRN:  [id number]  /  DOB:  08/28/1966   Dear Carmelina Dane,   This letter is sent to you at the request of [name].  He is  seeking medical disability.  I have been managing him for a severe  cardiomyopathy and refractory malignant hypertension.  These illnesses  have caused him significant problems with recent frequent  hospitalizations and the inability to return to work.  Despite maximal  medical therapy, he continues to have extremely elevated blood  pressures.  He is at risk for future cardiovascular events and in my  opinion should qualify for disability by any standards.   Thank you for your attention to this matter.  If you have any questions,  please contact my office.    Sincerely,      Rollene Rotunda, MD, [facility]    JH/MedQ  DD: 10/19/2009  DT: 10/20/2009  Job #: [id number]

## 2011-01-16 NOTE — Assessment & Plan Note (Signed)
HEALTHCARE                            CARDIOLOGY OFFICE NOTE   NAME:[name], [name]                      MRN:          [id number]  DATE:01/07/2007                            DOB:          [birth year]    PRIMARY CARDIOLOGIST:  Dr. Rollene Rotunda   PRIMARY CARE [name]:  Dr. Joette Catching.  He is also followed by  [facility].   PATIENT PROFILE:  A 45 year old African American male who was recently  discharged from [facility] on April 19 following non-ST elevation MI and  a new diagnosis of presumably ischemic cardiomyopathy who presents for  hospital followup.   PROBLEM LIST:  1. Coronary artery disease.      a.     April 2008 non-ST elevation myocardial infarction.      b.     December 18, 2006 left heart cardiac catheterization revealing       non-obstructive disease in the left coronary tree with tandem 80%       stenosis in the proximal right coronary artery and then an       occluded mid right coronary artery as well as an occluded right       ventricular branch. The distal right coronary artery was fed by       left to right collaterals.  The patient was medically managed.  2. Ischemic cardiomyopathy.      a.     December 16, 2006 ejection fraction of 20% to 30%, severe       diffuse left ventricular hypokinesis and moderate to marked left       ventricular hypertrophy.  3. Difficult to control hypertension.  4. Chronic renal insufficiency.  5. Tobacco abuse.  6. History of epistaxis and acute blood loss anemia.  7. History of pneumonia in February 2008.  8. History of marijuana usage.   ALLERGIES:  No known drug allergies.   HISTORY OF PRESENT ILLNESS:  A 45 year old [place] American male with  the above problem list who was transferred to [facility] December 14, 2006  following admission to [facility] with significant nose bleeds  with an H&H of 5 and 14.9.  Following arrival at [facility] he was seen  by GI as well as ENT with  subsequent nasal endoscopy and cautery.  There  was no GI source for his anemia based on EGD.  Because he was  complaining of intermittent chest discomfort, a 2-D echocardiogram was  performed April 14 revealing an EF of 20% to 30% with severe diffuse  left ventricular hypokinesis and moderate to marked left ventricular  hypertrophy.  Cardiology was consulted and initially we had planned a  non-invasive study; however, secondary to returned chest pain with  elevation of troponin, he was taken urgently to the cath lab on April 16  revealing significant right coronary artery disease with left to right  collaterals.  This was medically managed.  He remained hypertensive  throughout his hospitalization with titration of beta blocker, ACE-  inhibitor and clonidine.  Since discharge he has not been checking his  blood pressure regularly, but says  he never misses a dose of his  medication.  He has had at least 3 or 4 episodes of exertional 2 to 3  out of 10 right sided chest discomfort, somewhat that he had during his  hospitalization, lasting just a couple of minutes and relieved with  either rest or sublingual nitroglycerin.  He denies any dyspnea on  exertion, PND, orthopnea, dizziness, syncope or edema.   HOME MEDICATIONS:  1. Simvastatin 80 mg daily.  2. Carvedilol 12.5 mg b.i.d.  3. Isosorbide.  4. Mononitrate 60 mg daily.  5. Lisinopril 10 mg daily.  6. Clonidine 0.1 mg b.i.d.  7. Nitroglycerin 0.4 mg sublingual p.r.n. chest pain.   PHYSICAL EXAMINATION:  Blood pressure is 164/104, heart rate 73,  respirations 16.  He is afebrile.  Weight is 186 pounds.  A pleasant African American male in no acute distress.  Awake, alert and  oriented x3.  NECK:  No bruits or JVD.  LUNGS:  Respirations regular, unlabored. Clear to auscultation.  CARDIAC:  Regular S1, S2.  No S3.  S4 with a soft 2/6 systolic murmur at  the right sternal border.  ABDOMEN:  Soft, nontender.  Extended bowel sounds.   EXTREMITIES:  Warm and dry.  No clubbing, cyanosis or edema.  Dorsalis  pedis, posterior pulses 2+ and equal bilaterally.  Right groin site which was used for cath clear of bleeding, bruising or  hematoma.   EKG shows sinus rhythm with left atrial enlargement and early  repolarization.  A CBC and BMET are pending.   ASSESSMENT AND PLAN:  1. Coronary artery disease.  The patient has known occluded mid right      coronary artery with otherwise diffuse right coronary artery      disease in left to right collaterals.  He had non-obstructive      disease in the left coronary tree.  He does have intermittent chest      discomfort that he recognizes as his angina and this is relieved      with either rest or nitro.  Notably his blood pressure is elevated      today and after discussion with Dr. Diona Browner we have opted to try      and optimize his medical therapy to improve his blood pressure.  We      will continue his aspirin, statin, beta blocker, Ace and nitrate      and we will increase his Lisinopril to 20 mg daily.  He is advised      to check his blood pressure on a regular basis, as there is a lot      of room to titrate his Lisinopril.  2. Hypertension.  Blood pressure remains elevated and as above we will      increase Lisinopril to 20 mg daily. He is to follow up with      [facility] in approximately 1 week and if his blood pressures      at that time, we will recommend continue titration of ACE-inhibitor      therapy.  3. Hyperlipidemia.  He remains on simvastatin therapy.  At discharge      LDL from last month was 78.  He should have followup lipids and      LFTs when he sees Dr. Antoine Poche in June.  4. Ischemic cardiomyopathy.  Ejection fraction is 20% to 30% by echo      in April.  The patient appears euvolemic on exam and is tolerating  beta blocker and ACE-inhibitor therapy.  We will continue to     titrate.  We will likely need a 2-D echocardiogram in approximately       8 weeks to reevaluate left ventricular function and determine      candidacy for implantable cardioverter-defibrillator placement.  5. Chronic renal insufficiency.  The patient is to followup with      [facility] next week.  He is on Lisinopril therapy and we will      check a BMET today.  6. Anemia.  At discharge H&H were 10 and 29.5.  We will followup with      a CBC today.  7. Ongoing tobacco abuse.  Smoking cessation strongly advised, same      goes for marijuana.   DISPOSITION:  1. Check a CBC and BMET today.  The patient is to followup with      [facility] next week, then with Dr. Antoine Poche on June 18.  He      is advised to follow up.  2. Monitor his blood pressure more closely at home and bring records      of his trends to all of his appointments.      Nicolasa Ducking, ANP  Electronically Signed      Jonelle Sidle, MD  Electronically Signed   CB/MedQ  DD: 01/07/2007  DT: 01/07/2007  Job #: [id number]   cc:   Delaney Meigs, M.D.

## 2011-01-16 NOTE — Consult Note (Signed)
[name], MECHAM             ACCOUNT NO.:  [id number]   MEDICAL RECORD NO.:  [id number]          PATIENT TYPE:  INP   LOCATION:  IC07                          FACILITY:  [facility]   PHYSICIAN:  Thomas C. Wall, MD, FACCDATE OF BIRTH:  1966-05-11   DATE OF CONSULTATION:  04/23/2008  DATE OF DISCHARGE:                                 CONSULTATION   REFERRING PHYSICIAN:  Dorris Singh, DO, of [facility]   PRIMARY CARE PHYSICIAN:  Delaney Meigs, MD   PRIMARY CARDIOLOGIST:  Rollene Rotunda, MD, [facility] in [place].   REASON FOR CONSULTATION:  Congestive heart failure.   HISTORY OF PRESENT ILLNESS:  [name] is a 42-year male patient with  history of ischemic cardiomyopathy and systolic congestive heart failure  as well as difficult to control hypertension and possible noncompliance  who has had multiple admissions over the last several years with  hypertensive urgency resulting in acute pulmonary edema.  The patient  has not been evaluated by Dr. Antoine Poche in quite some time.  He was  initially evaluated in April 2008 in the setting of non-ST-elevation  myocardial function.  At that time, he had severe RCA disease noted by  catheterization with several branch occlusions as well as totally  occluded mid vessel with left to right collaterals.  This was treated  medically secondary to the diffuse nature disease as well as concomitant  renal insufficiency.  Of note, the patient at that time presented with  profound epistaxis and acute blood loss anemia, this also has factored  in the decision to treat this medically.  His other disease included a  mid 50% lesion in the circumflex.   The patient presented to [facility] yesterday with complaints  of chest pain and shortness of breath.  He was transported via EMS.  He  notes probable chronic NYHA class IIB-III symptoms with his congestive  heart failure.  Over the last 3-4 days, he has noted increasing chest  discomfort.  He is somewhat stoic during the exam and does not offer  much information during questioning.  His chest discomfort is somewhat  described as an ache.  There has been no radiation to his arm or jaw.  He denies nausea, diaphoresis, or syncope.  He has noted increasing  shortness of breath.  He has had shortness of breath, both with his  chest pain as well as with exertion.  He notes NYHA class III-IV  symptoms.  He has experienced orthopnea as well as PND.  He denies any  pedal edema, but has noted some abdominal pain and increased abdominal  girth.  In the emergency room at [facility], his blood pressure  was noted to be quite elevated at 112/157.  He was then placed on IV  Lasix as well as IV nitroglycerin.  He was diuresed quite a bit this  morning.  As noted, he does not answer questions very well, but overall,  his chest pain seems to better.  However, he continues to state I still  feel sick.  We are asked to further evaluate.  PAST MEDICAL HISTORY:  As outlined above.  In addition, the patient has  had a previous EF of 20-30%.  His last echocardiogram in May 2009  revealed an EF of 40-45%.  He has history of hypertension,  hyperlipidemia, and chronic renal insufficiency with baseline creatinine  of about 2.3.  He denies any surgical history.   MEDICATIONS PRIOR TO ADMISSION:  1. Norvasc 10 mg a day.  2. Lasix 40 mg b.i.d.  3. Clonidine 0.3 mg b.i.d.  4. Coreg 25 mg daily.  5. Aspirin 325 mg daily.  6. Simvastatin mg nightly.  7. Hydralazine 50 mg three times a day.  8. New iron 150 mg b.i.d.   ALLERGIES:  No known drug allergies.   SOCIAL HISTORY:  The patient lives in [place].  He is an ex-smoker.  Denies alcohol abuse.  He does admit to marijuana use.   FAMILY HISTORY:  He notes his mother is deceased and she did have  diabetes mellitus and hypertension.   REVIEW OF SYSTEMS:  Please see HPI.  He denies fevers, chills, headache,  sore throat,  dysuria, hematuria, bright red blood per rectum, melena, or  dysphagia.  He is noted a nonproductive cough.  Rest of the review of  systems are negative.   PHYSICAL EXAMINATION:  GENERAL:  A well-nourished and well-developed  male.  VITAL SIGNS:  Blood pressure this morning is 142/103, pulse 68,  respirations 21, and temperature 98.5, and oxygen saturation is 95% on  room air.  In's and out's, he is negative 1033 mL today.  HEENT:  Normal.  NECK:  Without JVD.  CARDIAC:  Normal S1 and S2.  Regular rate and rhythm.  A 1-2/6 systolic  ejection murmur heard best at the right upper sternal border.  LUNGS:  Decreased breath sounds bilaterally.  Crackles at the bases.  SKIN:  Without rash.  ABDOMEN:  Soft with normoactive bowel sounds.  No organomegaly.  He does  have some mild right upper quadrant tenderness to palpation.  EXTREMITIES:  Without clubbing, cyanosis, or edema.  MUSCULOSKELETAL:  No joint deformity.  NEUROLOGIC:  Alert and oriented x3.  Cranial nerves II through XII are  grossly intact.  ENDOCRINE:  Without thyromegaly.  LYMPH:  No lymphadenopathy - Exam without carotid bruits bilaterally.   Chest x-ray this morning reveals worsening right greater than left  airspace disease, question asymmetric edema.  EKG reveals sinus rhythm  with heart rate of 87, poor R-wave progression, no ischemic changes, LVH  with a QTc of 512.   LABORATORY DATA:  White count 7800, hemoglobin 9.8, hematocrit 30.2,  platelet count 315,000.  Sodium 136, potassium 3.2, BUN 28, creatinine  2.32, glucose 106, and albumin 2.6.  BNP 2470.  INR 1.5.  Urine drug  screen positive for THC.   IMPRESSION:  1. Acute on chronic systolic congestive heart failure.  2. Ischemic cardiomyopathy with an ejection fraction of 40-45%.  3. Coronary artery disease as outlined above.  4. Chronic renal insufficiency.  5. Normocytic anemia.  6. Hypertensive emergency.  7. Abnormal chest x-ray.   PLAN:  The patient  presents with recurrent hypertensive crisis and acute  pulmonary edema.  His chest x-ray does look somewhat worse today.  However, he has had some good diuresis this morning.  We will adjust his  medications by increasing his Lasix to 40 mg 3 times a day and we will  also changes hydralazine to 50 mg 4 times a day.  His troponins are  mildly  elevated, but this was in the setting of renal insufficiency as  well as acute on chronic systolic congestive heart failure.  This is  likely not related to ischemia.  The patient has an echocardiogram  pending at this time.  The patient admits to noncompliance with his  medications.  He did miss his medicines yesterday.  He has had his  regular home medications restarted.  The patient's nitroglycerin drip  will be weaned off as he is complaining of a headache.  The patient was  also seen by Dr. Valera Castle today.   Thank you very much for the consultation.  We will be glad to follow the  patient throughout the remainder of his admission.      Tereso Newcomer, PA-C      Jesse Sans. Daleen Squibb, MD, [facility]  Electronically Signed    SW/MEDQ  D:  04/23/2008  T:  04/24/2008  Job:  [id number]   cc:   Delaney Meigs, M.D.  Fax: [phone]   Dorris Singh, DO

## 2011-01-16 NOTE — H&P (Signed)
[name], [name]             ACCOUNT NO.:  [id number]   MEDICAL RECORD NO.:  [id number]          PATIENT TYPE:  INP   LOCATION:  IC03                          FACILITY:  [facility]   PHYSICIAN:  Lonia Blood, M.D.      DATE OF BIRTH:  October 03, 1965   DATE OF ADMISSION:  07/26/2008  DATE OF DISCHARGE:  LH                              HISTORY & PHYSICAL   Presenting complaint: High Blood pressure   HPI: Patient Coronary artery disease as indicated.  Again, will get  [facility] who has seen patient before, and also continue his  previous medications.  Otherwise, the rest of his medical history issues  including dyslipidemia, etc., will be treated with his home medication  and will simplify his medications at the time of discharge so he could  get those from [employer] that are on the $4 formulary.   He denied any chest pain, but has some shortness of breath.  Denied any  cough, no fever, no diarrhea, no nausea, vomiting.   PAST MEDICAL HISTORY:  Significant for congestive heart failure with  some cardiomyopathy, hypertensive in nature.  Also hypertension and  history of coronary artery disease.   ALLERGIES:  He has no known drug allergies.   MEDICATIONS:  1. Labetalol 100 mg q.i.d.  2. Amlodipine 10 mg daily.  3. Clonidine 0.3 mg b.i.d.  4. Aspirin 325 mg daily.  5. Lasix 40 mg twice a day.  6. Simvastatin 20 mg daily.  7. Hydralazine 50 mg 4 times a day.  8. Nu-Iron 150 mg twice a day.  9. Nitroglycerin 0.4 mg __________ .   SOCIAL HISTORY:  The patient was originally living in [place];  currently lives in [place].  Denied any alcohol use.  Was previously  drug abuser.  Still smokes about 1/2 to 1 pack per day.   FAMILY HISTORY:  Denied any significant family history.   REVIEW OF SYSTEMS:  A 12-point review of systems negative except per  HPI.   PHYSICAL EXAMINATION:  Temperature 98.3, initial blood pressure of  208/152 with a MAP of more than 150, pulse 94,  respiratory 20, sats 94%  on room air.  GENERAL:  The patient looks acutely ill, slightly in distress.  HEENT:  PERRL, EOMI.  NECK:  Supple.  No JVD, no lymphadenopathy.  RESPIRATORY:  He has good air entry bilaterally.  No wheezes, no rales.  CARDIOVASCULAR:  The patient has S1, S2, no murmur.  ABDOMEN:  Soft, nontender with positive bowel sounds.  EXTREMITIES:  Show no edema, cyanosis or clubbing.   LABORATORIES:  Labs are currently pending.  White count came back at  8.5, hemoglobin 11.4, platelet count 302.  Sodium is 138, potassium 3.8,  chloride 109 CO2 22, glucose 117, BUN 23, creatinine 1.19 with a calcium  of 8.6.  Initial cardiac enzymes showed a CK of 135, MB of 54.6 with a  troponin of 0.14.  BNP is more than 5000.   Chest x-ray showed mainly cardiomegaly and vascular congestion, basilar  atelectasis, and scarring.  Head CT without contrast was __________ .   ASSESSMENT:  This is a 45 year old man presenting with what appeared to  be hypertensive urgency secondary to not having his medications.   PLAN:  1. Hypertensive urgency.  Will admit the patient to step-down unit.      Keep him on nitroglycerin drip.  Restart all his oral medications      in the hospital, and titrate them as necessary.  As patient      improves, we will accordingly titrate for the nitroglycerin.  2. History of CHF.  Will get a 2-D echocardiogram and get his EF.  The      patient had prior one apparently in August of this year that showed      that his EF was mainly 25%.  There was diffuse hypokinesis, apical      akinesis, etc..  We will therefore increase his Lasix dose and get      cardiac consult.      Lonia Blood, M.D.  Electronically Signed     LG/MEDQ  D:  07/26/2008  T:  07/26/2008  Job:  [id number]

## 2011-01-16 NOTE — H&P (Signed)
NAMEWINDSOR, GOEKEN NO.:  [id number]   MEDICAL RECORD NO.:  [id number]          PATIENT TYPE:  INP   LOCATION:  IC09                          FACILITY:  [facility]   PHYSICIAN:  Osvaldo Shipper, MD     DATE OF BIRTH:  1966-05-28   DATE OF ADMISSION:  02/06/2008  DATE OF DISCHARGE:  LH                              HISTORY & PHYSICAL   NOTE:  The patient states he only sees a cardiologist from [place] at  [place].  I think this cardiologist is Rollene Rotunda, M.D. , [facility],  though the patient cannot remember his name.   ADMITTING DIAGNOSIS:  1. Hypertensive urgency.  2. Congestive heart failure exacerbation with pulmonary edema.  3. History of coronary artery disease with disease in the right      coronary artery.  4. Medical noncompliance.   CHIEF COMPLAINT:  Chest pain and shortness of breath since yesterday  evening.   HISTORY OF THE PRESENT ILLNESS:  The patient is a 45 year old African-  American male who has a history of  coronary artery disease and had a  cardiac cath back in April 2008, which revealed severe single-vessel  disease involving the right coronary artery, but this was to be managed  medically.  He also has chronic renal insufficiency.  The patient has a  history of noncompliance.  He has been admitted twice this year for  complications from noncompliance.  He was admitted last back in May 2009  to [facility] with severe hypertension, chest pain and CHF.   The patient mentions that he was in his usual state of health until  about yesterday morning when he was working at [employer] when he started  feeling left-sided chest pain and shortness of breath.  He went home and  these symptoms increased in intensity and he decided to come into the  hospital overnight.  He states he ran out of his medications two days  ago and did not bother to refill his prescriptions.  He mentions left-  sided chest pain, which is rated at an 8/10 in intensity, he feels a  dull achy kind of pain with no radiation.  He also has shortness of  breath.  He denies any nausea or vomiting.  He denies any cough, fever  or chills.  He denies any lower extremity swelling.  He does admit to  having orthopnea.   MEDICATIONS:  The patient's medications at home; unfortunately he does  not know the names of the medications, but according to a discharge  summary from Jan 09, 2008 he is suppose to be on the following:  1. Norvasc 10 mg daily.  2. Lasix 40 mg twice a day.  3. Clonidine 0.3 mg twice a day.  4. Coreg 25 mg twice a day.  5. Aspirin 325 mg daily.  6. Simvastatin 20 mg at bedtime.  7. Hydralazine 50 mg three times a day.  8. The patient is also suppose to be on lisinopril, but I do not know      if this was discontinued by his cardiologist.  9. There is also mention  of isosorbide mononitrate on has medication      list from Jan 07, 2008 in the H&P.   ALLERGIES:  No known drug allergies.   PAST MEDICAL HISTORY:  The patient's past medical history is positive  for:  1. Coronary artery disease  2. CHF.  3. Hypertension, which is poorly controlled because of noncompliance.  4. History of anemia with epistaxis.  5. The patient has RCA disease as noted earlier.  His EF was measured      at about 40% when he was admitted to [facility] in May; it has been      lower than that in the past.  6. Chronic kidney disease is also another problem in the past medical      history in this individual.   SOCIAL HISTORY:  The patient lives in [place].  He works at [employer]  He  quit smoking one month ago.  Denies any alcohol use.   FAMILY HISTORY:  The family history is positive for diabetes.   REVIEW OF SYSTEMS:  CONSTITUTIONAL:  General review of systems is  positive for weakness.  HEENT:  Unremarkable.  CARDIOVASCULAR:  As noted  in the HPI.  RESPIRATORY:  As in the HPI.  GASTROINTESTINAL:  Unremarkable.  GENITOURINARY:  Unremarkable.  NEUROLOGICAL:  Unremarkable.   PSYCHIATRIC:  Unremarkable.  The other systems are  unremarkable.   PHYSICAL EXAMINATION:  VITAL SIGNS:  Temperature 98.2, blood pressure  when he came in was 180/137, heart rate 79, respiratory rate 26, and  saturation 97% on room air.  It appears that his blood pressure was even  higher when he was evaluated by the EMS at home.  GENERAL EXAMINATION:  This is a well-developed, well-nourished male in  no distress.  HEENT:  There is no pallor.  No icterus.  Oral mucous membranes are  moist.  No oral lesions are noted.  NECK:  The neck is soft and supple.  No thyromegaly is appreciated.  HEART:  Cardiovascular - S1 and S2 are normal.  Regular.  No murmurs  appreciated.  JVD is present.  No S3 or S4 heard.  No bruits are heard.  LUNGS:  The lungs reveal crackles bilaterally one-third of the way up  both lungs.  No wheezing is appreciated.  ABDOMEN:  The abdomen is soft, nontender and nondistended.  EXTREMITIES:  The extremities show no edema.  NEUROLOGIC EXAMINATION:  Neurologically he is alert and oriented x3.  No  focal neurological deficits are present.   LABORATORY DATA:  White count is 9.0, hemoglobin is 11.9 and platelet  count is 260,000.  His BMET shows a glucose of 110, BUN is 27 and  creatinine is 2.3.  Cardiac markers are negative x1.  BNP is 2,890.  Urine drug screen is positive for marijuana. UA does not suggest any  infection.  He had an EKG, which shows sinus rhythm with left axis  deviation.  Intervals appear to be in the normal range.  He has no  definite Q waves, but he does have some T wave inversion in leads 1 and  aVL.  No acute ST changes are noted.  Compared to an EKG one month ago  the T inversions in V4, 5 and 6 are not present anymore.  He also has  evidence for left ventricular hypertrophy.  He also had a chest x-ray,  which showed recurrent CHF, but it was not as severe as noted  previously.   ASSESSMENT:  This is a  45 year old African-American male with  medical  problems as stated earlier who presents with a one-day history of chest  pain and shortness of breath.  He has evidence for pulmonary edema.  His  blood pressure is not controlled at all.  All this is the result of  medical noncompliance.  He has been out of his medications for two days  resulting in the current presentation.   PLAN:  1. Chest pain.  This most likely is a result of hypertensive heart      disease.  We will try to achieve blood pressure control quickly      with IV nitroglycerin.  This should help with his pain.  We will      rule him out for an acute coronary syndrome with serial cardiac      enzymes and have him seen by cardiology in the morning.  2. Pulmonary edema, the result of CHF.  Intravenous Lasix will be      utilized in this gentleman.  Nitroglycerin should also help.  3. Hypertensive Urgency.  We will restart all his antihypertensive      agents.  I will hold off in the lisinopril for now as I am not sure      if he indeed taking this medication at home.  Defer to cardiology      to initiate this.  4. Chronic renal insufficiency.  Appears to be stable.  5. I had a good discussion with him regarding compliance.  I explained      to him that if in the future he misses even one day's worth of his      medications this will definite happen again; he seems to      understand.   Continue his statin.  I anticipate the patient going home within the  next 2-3 days.  I will also await cardiology input.      Osvaldo Shipper, MD  Electronically Signed     GK/MEDQ  D:  02/06/2008  T:  02/06/2008  Job:  [id number]   cc:   Gerrit Friends. Dietrich Pates, MD, [facility]  [address]  [place], [place] [address]   Rollene Rotunda, MD, [facility]  [address]  [address]  [place]  [place] [address]

## 2011-01-16 NOTE — H&P (Signed)
NAMECAYDE, HELD NO.:  [id number]   MEDICAL RECORD NO.:  [id number]          PATIENT TYPE:  INP   LOCATION:  1823                         FACILITY:  [facility]   PHYSICIAN:  Lucita Ferrara, MD         DATE OF BIRTH:  1966-08-24   DATE OF ADMISSION:  03/25/2008  DATE OF DISCHARGE:                              HISTORY & PHYSICAL   PRIMARY CARE DOCTOR:  Unassigned   The patient is a 45 year old male with chief complaint of coffee-ground  emesis and nosebleed.  The patient's coffee-ground emesis that he had  earlier in the day was also accompanied by nosebleed that was not gross  blood.  The patient denies nonsteroidal anti-inflammatory medications,  denies cocaine abuse,.  He tells me that he has a history of  hypertension and coronary artery disease.   In the emergency room here he was found to have severely elevated blood  pressures, and was called for admission for possible gastrointestinal  bleed -- given his black stools and coffee-ground emesis.  The patient  denies any focal abdominal pain.  He denies gross vomitus.  The patient  also denies any headaches, changes in neurological status.  Again, the  patient denies cocaine abuse.   PAST MEDICAL HISTORY:  1. History of coronary artery disease.  Catheterization April 2008      revealed single-vessel disease in a right coronary artery, felt to      be able to manage medically.  2. Congestive heart failure.  3. Poorly controlled hypertension, secondary to noncompliance.  4. Severe right coronary artery disease, with ejection fraction of      40%.  5. Chronic kidney disease.  Note that he recently had admission at      [facility]; admitted February 06, 2008 with hypertensive      urgency and discharged February 08, 2008.  He had chest pain/pulmonary      edema, hypertensive urgency.  During the hospital course at [facility] the patient was placed on IV Lasix and discharged home stable.  6. History of  recreational drug abuse with marijuana.   REVIEW OF SYSTEMS:  As per HPI, otherwise negative.  He denies any focal  neurological deficits, fevers, chills, focal abdominal pain.   SOCIAL HISTORY:  Denies smoking.  He is a nondrinker.  Recreational drug  abuse with marijuana at times.  No recent travel history.   ALLERGIES:  NO KNOWN DRUG ALLERGIES.   MEDICATIONS:  1. Norvasc 2 mg daily.  2. Lasix 40 mg b.i.d.  3. Clonidine 0.3 mg b.i.d.  4. Coreg 25 mg once daily.  5. Aspirin 325 mg daily.  6. Simvastatin 20 mg at bedtime.  7. Hydralazine 50 three times a day.   PHYSICAL EXAMINATION:  Generally speaking the patient is in no acute  distress.  Blood pressure 184.123, pulse 87, respirations 18,  temperature 97.1, pulse oximetry 99% on room air.  HEENT:  Normocephalic, atraumatic.  His left nostril presents with some  dry blood.  There was no gross or dried blood in the  back of his throat.  NECK:  Supple.  No JVD.  CARDIOVASCULAR:  S1 and S2.  Regular rate and rhythm.  No murmurs, rubs,  clicks.  LUNGS:  Clear to auscultation bilaterally.  No rhonchi, rales or  wheezes.  ABDOMEN:  Soft, nontender, nondistended.  Positive bowel sounds.  RECTAL:  Normal tone.  There were black stools, Hemoccult positive.  NEUROLOGIC:  Alert, oriented x3.  Cranial nerves II-XII grossly intact.   EMERGENCY ROOM COURSE:  The patient was started on IV hydration.  Working diagnosis was acute blood loss anemia, given high BUN.  EKG  showed a left ventricular hypertrophy, prolonged QT nonspecific ST-T  wave changes.  No significant change prior to EKG.   LABORATORY DATA:  Hemoglobin 10.2, hematocrit 32.1, platelets 236,  lipase 14.  LFTs normal.  INR 1.3.  Urinalysis negative.  The patient  was typed and crossed and held.  Chest x-ray shows no acute  cardiopulmonary disease.  BUN is currently 50, creatinine 2.1.   ASSESSMENT/PLAN:  A 45 year old with:  1. Coffee-ground emesis/symptomatic dizziness  upon      ambulation/normocytic anemia that is slowly titrating down (13.1      February 08, 2008 to 11.6 on March 04, 2008 to 10.6 today).  2. Anterior nosebleed, that has subsided now.  3. Hypertensive urgency.  Felt to be secondary to noncompliance in the      past.  4. Coronary artery disease, status post catheterization April 2008;      with severe right coronary artery single-vessel disease.  Medical      treatment.  Minimal left anterior descending.  5. Chronic kidney disease with increased BUN currently.  Creatinine      remained stable.  Likely secondary to volume contraction versus      active GI bleed.  6. History of congestive heart failure; stable per chest x-ray now.      The patient has no shortness of breath.   DISCUSSION AND PLAN:  We will go ahead and admit the patient to the step-  down unit.  Rule out GI bleed.  His stool is black, officially his  Hemoccult is negative.  We will send stool for Hemoccult x3.  Will type  and cross and hold.  Currently in the emergency room he was started on  IV fluids yet hypertensive!  We will hold off on excessive IV hydration  for now.  We will watch his pressure.  We will type and cross and hold.  H&H q.6 h.  IV Labetalol for his blood pressure.  Will hold lisinopril  for now, given his potential volume contraction and possible GI bleed.  It is pertinent that we monitor his fluid status and not over-hydrate  him, given his CHF.  Obviously we will need to get appropriate  consultations involved, including gastroenterology for possible  endoscopy versus colonoscopy.  In regards to his nosebleed, it is  subsided for now.  The rest of the plan will depend on his progress.  We  will hold his aspirin.  DVT prophylaxis with SCD boots.      Lucita Ferrara, MD  Electronically Signed     RR/MEDQ  D:  03/25/2008  T:  03/25/2008  Job:  [id number]

## 2011-01-16 NOTE — Consult Note (Signed)
NAME:  [name], [name]             ACCOUNT NO.:  [id number]   MEDICAL RECORD NO.:  [id number]          PATIENT TYPE:  INP   LOCATION:  IC07                          FACILITY:  [facility]   PHYSICIAN:  Jorja Loa, M.D.DATE OF BIRTH:  Apr 24, 1966   DATE OF CONSULTATION:  DATE OF DISCHARGE:                                 CONSULTATION   REASON FOR CONSULTATION:  Elevated BUN and creatinine.   [name] is a 45 year old African American male with a past medical  history of hypertension, history of severe cardiomyopathy with a low  ejection fraction, coronary artery disease, and a history of renal  failure who presently came in with complaints of chest pain associated  with some headache, nausea, and also an episode of vomiting.  According  to the patient, he ran out of his hypertensive medication and because of  that he did not take any for the last 2 days.  When he came in, he was  found to have hypertension with an elevated BUN and creatinine and hence  the patient was admitted to the intensive care unit and a consult is  called because of elevated BUN and creatinine.  [name] states that  he is being followed by a nephrologist in [place] and at this  moment,he does not remember the name of the nephrologist.  He he was  told about his renal failure a couple of months ago and since then no  other information is available.  He denies any nausea or vomiting at  this moment.  He also states his chest pain has improved.   PAST MEDICAL HISTORY:  1. Coronary artery disease.  2. He has a history of a myocardial infarction in 2008.  3. History of cardiomyopathy with an ejection fraction of 20-30%.  4. History of chronic renal failure.  His baseline from the      documentation creatinine seems to be around 2.2.  5. History of hypertension.  6. History of hypercholesterolemia.   PAST SURGICAL HISTORY:  He has had cardiac catheterization and otherwise  no other history.   SOCIAL  HISTORY:  He has a history of smoking.  He has a history of also  using marijuana.  No history of alcohol abuse.   ALLERGIES:  NO KNOWN ALLERGIES.   MEDICATIONS:  At this moment consist of:  1. Aspirin 81 mg p.o. daily.  2. Coreg 25 mg p.o. b.i.d.  3. Clonidine patch once a week 0.3.  4. Colace 100 mg p.o. daily.  5. Lovenox 40 mg subcutaneously daily.  6. Lasix 40 mg IV b.i.d.  7. Imdur 60 mg p.o. daily.  8. Normodyne daily.  9. Protonix 40 mg p.o. daily.  10.Zocor 80 mg p.o. nightly.  11.He is getting IV fluids half normal saline at 85 mL per hour.   REVIEW OF SYSTEMS:  Presently, he feels better.  He does not have any  nausea or vomiting.  The chest pain also seems to be getting better.   PHYSICAL EXAMINATION:  VITAL SIGNS:  Blood pressure is 170/80, pulse of  68.  HEENT:  No conjunctival pallor.  No  thrush.  Oral mucosa seems to be  dry.  CHEST:  Clear to auscultation.  No rales or rhonchi.  No egophony.  HEART:  Reveals a regular rate and rhythm.  No murmur.  ABDOMEN:  Soft.  Positive bowel sounds.  EXTREMITIES:  He does not have any edema.   LABORATORIES:  His white blood cell count is 14.9, his hemoglobin is  17.9, hematocrit 40.9, and platelets of 280,000.  His sodium is 136,  potassium 3.6, BUN is 27, creatinine 2.3.  His creatinine has been  around 2.  It was around 2.1-2.3, hence, probably no significant change.  He has a calcium of 8.7 and BNP is 2,690.  He has an ultrasound which  was done during that time which showed the right kidney to be 11.2 and  left kidney 10.2.   ASSESSMENT:  1. Renal insufficiency, chronic.  His baseline creatinine is around 2      and slightly elevated.  His GFR is 78, hence, probably stage III      renal insufficiency that could be secondary to hypertensive      nephrosclerosis.  2. History of hypertension, probably related to noncompliance with      medication.  According to the patient, he ran out of his medication      and he  became hypertensive.  3. Chest pain, possibly cardiac.  He has multiple risk factors for      coronary artery disease.  He had cardiac catheterization performed      before with some significant lesions.  4. Hypercholesterolemia.  5. History of cardiomyopathy with a low ejection fraction.  I am not      sure whether this is secondary to hypertensive nephrosclerosis or      ischemia.  6. History of non ST elevated myocardial infarction.  7. History of cardiomyopathy.   RECOMMENDATIONS:  At this moment since the patient has a complete  workup, no additional workup is required.  Probably, starting him on  p.o. Normodyne seems to be reasonable.  If that does not control the  patient, he may benefit from calcium channel blockers.  Since at this  moment the patient is stable, I will continue his other treatments and  will follow and when he is discharged he will be followed by his primary  care physician and nephrologist in [place].      Jorja Loa, M.D.  Electronically Signed     BB/MEDQ  D:  10/07/2007  T:  10/07/2007  Job:  [id number]

## 2011-01-16 NOTE — Discharge Summary (Signed)
[name], [name] NO.:  [id number]   MEDICAL RECORD NO.:  [id number]          PATIENT TYPE:  INP   LOCATION:  3735                         FACILITY:  [facility]   PHYSICIAN:  Duke Salvia, MD, FACCDATE OF BIRTH:  1965-10-30   DATE OF ADMISSION:  04/25/2009  DATE OF DISCHARGE:  04/28/2009                               DISCHARGE SUMMARY   PRIMARY CARDIOLOGIST:  Rollene Rotunda, MD, [facility]   DISCHARGE DIAGNOSES:  1. Coronary artery disease.      a.     Status post non-ST-elevation myocardial infarction in 2008       in the setting of anemia secondary to epistaxis.      b.     Cardiac catheterization demonstrated 50% mid left anterior       descending stenosis, 80% proximal right coronary artery, 90% mid       right coronary artery with ulcerative plaque and occluded right       marginal branch, total occlusion over the mid right coronary       artery with left-to-right collaterals.  2. Hypertensive urgency.  3. 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  4. Hyperlipidemia.  5. Chronic renal insufficiency.      a.     Baseline creatinine 2.2.   SECONDARY DIAGNOSES:  1. Anemia.  2. History of polysubstance abuse.  3. Medical noncompliance.   ALLERGIES:  NKDA.   PROCEDURE:  1. Chest x-ray April 25, 2009:  Acute asymmetric pulmonary edema      suspect in the setting of marked cardiomegaly.  2. EKG, April 25, 2009:  NSR, rate 85, LVH, lateral T-wave flattening      with no significant changes compared to prior tracing completed,      December 16, 2008.  3. Renal ultrasound, April 26, 2009:  Increased renal echogenicity      compatible with medical renal disease.  Negative for      hydronephrosis.  4. EKG, April 27, 2009:  No significant change from prior tracing.  5. EKG, April 28, 2009:  No significant change from prior tracing.   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  Pain is  stabbing in nature, ongoing despite nitro drip.  It is nonradiating and  identical in nature to his prior heart attack.  He did keep working  throughout the afternoon, but later became lightheaded and shortness of  breath and proceeded to the ED for eval.  He denies presyncope, lower  extremity edema, orthopnea, or PND.  He does report strict medical  compliance as well as sodium restriction and more than his usual state  of health until this episode.  He did follow up after his recent  hospitalization for the same type of symptomatology with Dr. Antoine Poche on  Jan 19, 2009.  He denies illicit drug use, herbal supplements, or energy  supplements.  He has been checking his BP at home and consistently  running in the 180s/90s.  Meds given in the emergency department  included sublingual nitroglycerin x2, IV drip at 20 mcg per minute, 4 mg  of IV morphine, and 40 mg of IV Lasix with appropriate urine output.   HOSPITAL COURSE:  The patient was admitted after being seen and  evaluated by his primary cardiologist.  He underwent procedures as  described above as well as being put back on his home medication regimen  and medications with a nitro drip.  The patient's BP trended down and he  was asymptomatic in the morning of April 27, 2009.  IV drip was  discontinued and the patient remained asymptomatic except for mild  stomachache on April 28, 2009.  His BP remained significantly improved,  although still mild-to-moderately hypertensive with systolic blood  pressure ranging from 131 to 159 between 5 hours prior to the afternoon  of April 28, 2009.  He was seen and evaluated by Dr. Sherryl Manges.  He  discussed his status with his primary cardiologist and both agreed  that  he is stable enough for discharge.  The patient will receive his  medication lists with the addition of Imdur 60 mg p.o. daily.  He will  receive prescriptions for all this medication just in case he needs  refill and need to further assist with compliance.  He has received  extensive education/encouragement regarding the importance of medication  compliance.  He has early followup to schedule with his primary  cardiologist in [place] on May 04, 2009, at 9:15 p.m.  Prior to  discharge, the patient had all questions and concerns addressed.   Note:  The patient did have positive cardiac enzymes, thought to be  secondary to demand ischemia in the setting of hypertensive urgency.   DISCHARGE LABORATORY DATA:  On April 26, 2009, WBC 8.9, HGB 11.3, HCT  33.3, PLT count 246.  Protime 18.6, INR of 1.6, aPTT 36.  On April 27, 2009, sodium 137, potassium 3.7, chloride 101, CO2 27, BUN 30,  creatinine 2.24, glucose 100.  The patient did have 3 sets of cardiac  enzymes, which were positive but minimally so and down trending with the  first set of markers; CK 118, CK-MB 3.3 with positive index of 2.8, and  troponin I of 0.12, trending down to CK 103, CK-MB of 3.8 with index  3.4, and troponin I 0.10, and third set trending down again with CK 133,  CK-MB 4.4, relative index 3.3, troponin I of 0.08.  Total cholesterol  128, triglycerides 60, HDL 25, LDL 91, VLDL 12, and TSH 1.812.   FOLLOWUP PLANS AND APPOINTMENTS:  Dr. Antoine Poche, May 04, 2009, at  9:15 a.m. in [place].   DISCHARGE MEDICATIONS:  Please see computer list.   DURATION OF DISCHARGE ENCOUNTER INCLUDING PHYSICIAN TIME:  35 minutes.      Jarrett Ables, [facility]      Duke Salvia, MD, [facility]  Electronically Signed    MS/MEDQ  D:  04/28/2009  T:  04/29/2009  Job:  [id number]   cc:   Rollene Rotunda, MD, [facility]

## 2011-01-16 NOTE — Group Therapy Note (Signed)
[name], [name]             ACCOUNT NO.:  [id number]   MEDICAL RECORD NO.:  [id number]          PATIENT TYPE:  INP   LOCATION:  IC03                          FACILITY:  [facility]   PHYSICIAN:  Dorris Singh, DO    DATE OF BIRTH:  1966-07-07   DATE OF PROCEDURE:  DATE OF DISCHARGE:                                 PROGRESS NOTE   The patient is seen today.  Cardiology has seen him.  He has been weaned  off his nitro drip.  He states that he is doing better.  As mentioned  before, there is no cardiac intervention that will need to be done.  The  patient needs to stay on his medications.  I discussed with him as I  have discussed with him before in the past about staying on his meds.  He stated there was some issue with his [facility].  I explained to  him he needs to call [facility] and follow up with any appointments  that are made for him.   PHYSICAL EXAMINATION:  VITAL SIGNS:  His vitals signs are stable.  GENERAL:  The patient is an [place] male who is well-developed,  well-nourished in no acute distress.  HEART:  Regular rate and rhythm.  LUNGS:  Clear to auscultation bilaterally.  ABDOMEN:  Soft, nontender and nondistended.  EXTREMITIES:  Positive pulses.  No edema, ecchymosis or cyanosis.   LABORATORY DATA:  White count 4.1, hemoglobin 10.7, hematocrit 32.3,  platelet count 218.  Troponin of 1.35 was his last troponin yesterday.   ASSESSMENT/PLAN:  1. Hypertensive crisis.  The patient's blood pressure has been under      control.  They were able to wean him off the nitroglycerin drip and      he was started on p.o. hypertensive medications.  We will continue      to monitor his blood pressure and make changes as appropriate.  2. Chest pain.  This is a consequence they feel from his hypertensive      crisis, as well as his uncontrolled hypertension, so we will      continue to counsel the patient taking his medications and continue      to monitor him.   At this point in time, the patient is stable      enough to be transferred out of the intensive care unit and we will      go ahead and do that today.  Will continue to monitor and change      therapy as necessary.      Dorris Singh, DO  Electronically Signed     CB/MEDQ  D:  10/07/2008  T:  10/07/2008  Job:  [id number]

## 2011-01-16 NOTE — Discharge Summary (Signed)
[name], [name] NO.:  [id number]   MEDICAL RECORD NO.:  [id number]          PATIENT TYPE:  INP   LOCATION:  A311                          FACILITY:  [facility]   PHYSICIAN:  Skeet Latch, DO    DATE OF BIRTH:  [birth year]   DATE OF ADMISSION:  07/25/2008  DATE OF DISCHARGE:  11/25/2009LH                               DISCHARGE SUMMARY   DISCHARGE DIAGNOSES:  1. Hypertensive crisis.  2. Chronic congestive heart failure.  3. Coronary artery disease.  4. Chronic kidney disease.  5. Anemia.  6. Cardiomyopathy.  7. Dyslipidemia.   BRIEF HOSPITAL COURSE:  This is a 45 year old [place] American male, who  presented with headache.  The patient states that headache was  precipitated by nothing.  The patient states that the headache was  gradually getting worse.  It was located in the back of his head  bilaterally and in his bilateral forehead.  The key address is a dull  headache.  He describes his symptoms as moderate to severe.  The patient  has had history of hypertension and he has not been on his medications.  He does also have history of dyslipidemia and congestive heart failure.  The patient was admitted with hypertensive crisis.  The patient was  started on nitroglycerin drip.  He was restarted on some of his oral  medications, and Cardiology was consulted due to his history of CHF and  coronary artery disease.  He did have a chest x-ray that showed  cardiomegaly and vascular congestion with basilar atelectasis and  scarring.  He had CT of his head performed that showed no acute  intracranial abnormalities.  As stated, the patient was seen by  Cardiology.  The patient was on multiple medications on admission, which  we do not believe he was taking.  His medications were adjusted by  Cardiology as well as restarted.  The patient's hypertensive crisis has  improved, and we felt the patient could be discharged to home with close  followup with primary care  physician and to be continued on his  medications.  The patient stated history of ischemic cardiomyopathy in  the range of 20-30%.  Last echocardiogram in August 2009 showed an EF of  25% with mild LVH and mild RV dysfunction.  The patient has had a  history of non-ST-elevation myocardial infarction in April 2008.  Today,  the patient denied any chest pain, headache, or any adjacent  symptoms.  We feel the patient could be discharged to home.   MEDICATIONS ON DISCHARGE:  1. Aspirin 325 mg daily.  2. Lasix 20 mg twice a day.  3. Simvastatin 20 mg daily.  4. Nu-Iron 150 mg twice a day.  5. Nitroglycerin 0.4 mg sublingual as needed.  6. Clonidine 0.3 mg three times a day.  7. Hydralazine 75 mg three times a day.  8. Imdur 60 mg daily.  9. Coreg 12.5 mg twice a day.  10.Verapamil 80 mg three times a day.   VITALS ON DISCHARGE:  Temperature is 98.1, pulse 59, respirations 18,  blood pressure 136/93, and sating 95% on room  air.   LABS ON DISCHARGE:  Sodium 140, potassium 3.5, chloride 104, CO2 is 29,  glucose 110, BUN 28, creatinine 2.24; white count 7.5, hemoglobin 11.2,  hematocrit 35.0, and platelet count 280,000.   CONDITION ON DISCHARGE:  Stable.   DISPOSITION:  The patient will be discharged to home.   DISCHARGE INSTRUCTIONS:  The patient to follow up with Dr. Antoine Poche on  September 08, 2008, at 2 p.m.  I am sure the patient has primary care  physician, he needs to follow up with primary care physician in the next  1-2 weeks.  The patient is to maintain a low-salt, heart-healthy diet.  He is to increase his activities pretty slowly.  The patient is to  return to emergency room if he has any similar symptoms with severe  headache or chest pain and/or call 911.      Skeet Latch, DO  Electronically Signed     SM/MEDQ  D:  07/28/2008  T:  07/29/2008  Job:  [id number]

## 2011-01-16 NOTE — Assessment & Plan Note (Signed)
[facility]                            CARDIOLOGY OFFICE NOTE   NAME:[name], [name]                      MRN:          [id number]  DATE:01/19/2009                            DOB:          1966-04-26    PRIMARY CARE PHYSICIAN:  None.   REASON FOR PRESENTATION:  Evaluate the patient with hypertension and  cardiomyopathy.   HISTORY OF PRESENT ILLNESS:  The patient returns for followup of the  above.  He misses appointments routinely.  He was actually in the  hospital on December 16, 2008, with hypertensive urgency.  He was managed  with medications and discharged on the list below.  His blood pressure  was easy to control once he was hospitalized.  He now says that he is  taking his medications as prescribed.  He actually has his bag of  medicines.  He has Medicaid, which helps now.  He says he is complying  with diet and lifestyle changes.  He says he is not drinking or using  illicit drugs.  However, I did point out to him that his screen for  marijuana was positive in April.   He denies any shortness of breath.  He is not having any PND or  orthopnea.  He has not been having any palpitations, presyncope, or  syncope.  He denies any chest discomfort, neck, or arm discomfort.   PAST MEDICAL HISTORY:  1. Coronary artery disease (non-ST segment elevation myocardial      infarction in 2008 in the setting of epistaxis and anemia.      Catheterization demonstrated 50% mid LAD stenosis, the right      coronary artery had 80% proximal stenosis, and 90% mid disease with      an ulcerated plaque.  There was a marginal branch of the right      coronary artery that was occluded.  Mid right coronary artery was      totally occluded with left-to-right collaterals.  He was managed      medically).  2. Cardiomyopathy (mixed ischemic/nonischemic with an EF approximately      20%).  3. Hypertension.  4. Hyperlipidemia.  5. Chronic renal insufficiency.  6.  Anemia.  7. Medical noncompliance.  8. Polysubstance abuse.   ALLERGIES/INTOLERANCES:  None.   MEDICATIONS:  1. Aspirin 81 mg daily.  2. Carvedilol 12.5 mg b.i.d.  3. Hydralazine 100 mg t.i.d.  4. Potassium 20 mEq daily.  5. Clonidine 0.1 mg t.i.d.  6. Furosemide 80 mg daily.   REVIEW OF SYSTEMS:  As stated in the HPI and otherwise negative for all  other systems.   PHYSICAL EXAMINATION:  GENERAL:  The patient is pleasant and in no  distress.  VITAL SIGNS:  Blood pressure 158/104, heart rate 60 and regular, weight  184 pounds, body mass index 28.  HEENT:  Eyelids are unremarkable.  Pupils equal, round, reactive to  light.  Fundi not visualized.  Oral mucosa unremarkable.  NECK:  No jugular venous distention at 45 degrees.  Carotid upstroke  brisk and symmetric.  No bruits, no thyromegaly.  LYMPHATICS:  No cervical,  axillary, or inguinal adenopathy.  LUNGS:  Clear to auscultation bilaterally.  BACK:  No costovertebral angle tenderness.  CHEST:  Unremarkable.  HEART:  PMI not displaced or sustained.  S1 and S2 within normal limits.  No S3, no S4.  No clicks, no rubs, no murmurs.  ABDOMEN:  Flat, positive bowel sounds, normal in frequency and pitch.  No bruits, no rebound, no guarding.  No midline pulsatile mass.  No  hepatomegaly.  No splenomegaly.  SKIN:  No rashes, no nodules.  EXTREMITIES:  Pulses 2+ throughout.  No edema, no cyanosis, no clubbing.  NEUROLOGIC:  Oriented to person, place, and time.  Cranial nerves II  through XII grossly intact.  Motor grossly intact.   EKG, sinus rhythm, rate of 60, left ventricular hypertrophy with  repolarization changes.   ASSESSMENT AND PLAN:  1. Hypertension.  Blood pressure is still elevated, but not nearly as      high as it has been consistently in the past.  At this point, I am      going to leave the regimen where it is as he was very well      controlled on the same regimen when in the hospital.  If, however,      at the  next appointment his blood pressure is still elevated, I      will most likely increase his clonidine.  Again, he understands the      need for medical compliance, and we discussed this at great length.  2. Cardiomyopathy.  He seems to have class I symptoms.  He will      continue with medical management, salt, and fluid restriction.  3. Coronary artery disease.  He has coronary disease as described.  He      is having no ongoing angina.  No further cardiovascular testing is      suggested.  He will continue with risk reduction.  4. Dyslipidemia.  If he is compliant with visits and meds, in the      future, I would check a lipid profile and prescribe a statin.     Rollene Rotunda, MD, [facility]  Electronically Signed    JH/MedQ  DD: 01/19/2009  DT: 01/20/2009  Job #: [id number]

## 2011-01-16 NOTE — H&P (Signed)
[name], [name]             ACCOUNT NO.:  [id number]   MEDICAL RECORD NO.:  [id number]          PATIENT TYPE:  INP   LOCATION:  IC07                          FACILITY:  [facility]   PHYSICIAN:  Dorris Singh, DO    DATE OF BIRTH:  [birth year]   DATE OF ADMISSION:  04/22/2008  DATE OF DISCHARGE:  LH                              HISTORY & PHYSICAL   The patient is a 45 year old male who presents with the chief complaint  of abdominal pain.  He states that he has had a cough.  It hurts to lie  down and nothing made it better.  Everything made it worse.  He denies  any swelling in his hands or feet.  He said that it started 2 days ago.  While he was here, they obtained a chest x-ray on him and found that he  had cardiomegaly with pulmonary vascular congestion, new bilateral  interstitial infiltrates, favor edema over pneumonia, clinical  correlation.  At that point in time it was determined that this was  possibly due to a cardiac etiology.  At that point in time it was  determined the patient probably need to come in for congestive heart  failure.  The patient has been admitted on several occasions for  uncontrolled hypertension and congestive heart failure.  He is a poor  historian of his history.  I have asked him several times if he has been  diagnosed with congestive heart failure.  He stated he did not know.  In  review of his history, I see that he has been seen by [facility]  for congestive heart failure and uncontrolled hypertension due to  medical noncompliance.   PAST MEDICAL HISTORY:  Is significant for:  1. Coronary artery disease.  2. Non-ST elevated MI in April of 2008.  3. Cardiomyopathy with an EF of 20-30%.  4. Hypertension.  5. Chronic renal insufficiency.  6. Tobacco abuse.  7. History of pneumonia.  8. History of marijuana use.   SURGICAL HISTORY:  He has had a cardiac catheterization that  demonstrated nonobstructive disease in the left system with  an 80%  lesion in the right coronary artery disease and occluded mid to right  coronary artery vessel.   He claims to be a nonsmoker.  However, he does smoke cannabis, a  nondrinker and he is positive for cannabis here on this visit, and was  positive when he was seen over at [facility].  He has no known drug  allergies.  Currently he smokes 2-3 cigarettes a day for the last 3  years and drinks alcohol socially.  This is what was found in his latest  H&P.   The patient is on several medications that include:  1. Norvasc 10 mg once daily.  2. Lasix 40 mg twice daily.  3. Clonidine 0.3 mg twice daily.  4. Coreg 25 mg twice daily.  5. Aspirin 325 mg once daily.  6. Simvastatin 20 mg at bedtime.  7. Hydralazine 50 mg 3 times daily.   The patient states that he took his medication last night, but with his  blood pressure as high as it is, he has a history of medical  noncompliance.   REVIEW OF SYSTEMS:  Positive for weakness.  Negative for any changes in  his vision.  Negative for any changes with hearing, sore throat or  changes in smell.  Positive for cough and difficulty breathing.  Negative for nausea, vomiting, diarrhea.  Positive for abdominal pain.  Negative for dysuria or frequency.  Negative for myalgias or  arthralgias.  Negative for anemia.   PHYSICAL EXAM:  The patient has been in hypertensive crisis while in the  emergency department. He has a history of noncontrolled hypertension.  Two readings:  212/157 and 187/41.  The patient is asymptomatic.  There  is no complaint of blurred vision, headache or chest pain, jaw pain,  diaphoresis or sweating at these numbers.  Pulse rate is 86.  Respirations 20, temperature 97.7, pulse oximetry 99% on room air.  GENERAL:  The patient is a 45 year old African American male who is well-  developed, well-nourished in no acute distress.  He is pleasant and  cooperative.  HEART:  Regular rate and rhythm with a 2/6 systolic ejection  murmur.  LUNGS:  Lungs are decreased breath sounds with some crackles at the  bases.  ABDOMEN:  Soft.  There is some tender midepigastric.  No rigidity,  guarding or organomegaly noted.  EXTREMITIES: There is no clubbing, edema or cyanosis noted.  Head is atraumatic, normocephalic.  PERRLA.  EOMI.  NECK:  Supple, nontender, nondistended.  NEUROLOGIC: Cranial nerves II-XII are grossly intact.  He is alert and  oriented.   LABORATORIES THAT WERE DONE:  Troponin was 0.9 and his BNP was 3150.  His chest x-ray was reviewed above.  Sodium is 139, potassium 3.8,  chloride 106, carbon dioxide 24, glucose 101, BUN 24, creatinine 2.14.  His white count is 10.2, hemoglobin 10.9, hematocrit 33.9, platelet  count 326.  His urine drug screen is positive for cannabis and his urine  is positive for protein of 100.   ASSESSMENT/PLAN:  1. Congestive heart failure.  2. Anemia.  3. Renal insufficiency.  4. Hypertensive crisis.   PLAN:  Admit the patient to the intensive care unit for monitoring and  start him on nitroglycerin drip to get his blood pressure under better  control.  In the meantime,we will start him on his new blood pressure  medication, his old blood pressure medication, and see if that brings  him under better control.  We will also consult [facility] and  obtain another 2-D echo.  The patient's last 2-D echo was done just back  in May of 2009, which demonstrated that his ejection fraction was  estimated to be around 40-45%, and his left ventricular systolic  function with overall decreased.  We  will also obtain an ABG in the  morning, an EKG as well.  We will start him on deep vein thrombosis and  gastrointestinal prophylaxis, and give him a nebulizer treatment as well  as anti-tussive.  We will continue to monitor him and change therapy as  necessary.     Dorris Singh, DO  Electronically Signed    CB/MEDQ  D:  04/22/2008  T:  04/22/2008  Job:  [id number]

## 2011-01-16 NOTE — H&P (Signed)
[name], [name] NO.:  [id number]   MEDICAL RECORD NO.:  [id number]          PATIENT TYPE:  INP   LOCATION:  2608                         FACILITY:  [facility]   PHYSICIAN:  Sarajane Marek, MD     DATE OF BIRTH:  08-16-1966   DATE OF ADMISSION:  04/25/2009  DATE OF DISCHARGE:                              HISTORY & PHYSICAL   CARDIOLOGIST:  Rollene Rotunda, MD, [facility]   CHIEF COMPLAINT:  Chest pain.   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  It is stabbing in nature and it is ongoing despite  nitroglycerin drip.  It does not radiate and is identical in nature to  his prior heart attack.  He did keep on working throughout the  afternoon, but later became lightheaded with shortness of breath and  proceeded to the emergency department.  He did not have presyncope or  syncope.  He has not had lower extremity edema, orthopnea, or PND.  The  patient does report strict medical compliance as well as sodium  restriction and has not had any recent medication changes.  He did  follow up after his recent hospitalization for the same thing with Dr.  Antoine Poche despite the fact that he has been lost to follow up in the  past.  He denies illicit drug use, herbal supplements, or energy  supplements.  He has been checking his blood pressure at home and has  been running consistently 180s/90s.  Medications given in the emergency  department include sublingual nitroglycerin x2 and start of  nitroglycerin drip currently at 20 mcg, morphine 4 mg IV, and Lasix 40  mg IV with appropriate urine output.   ALLERGIES:  None.   MEDICATIONS:  1. Aspirin 81 mg daily.  2. Coreg 12.5 mg b.i.d.  3. Hydralazine 100 mg t.i.d.  4. Potassium chloride 20 mEq  daily.  5. Clonidine 0.1 mg t.i.d.  6. Lasix 80 mg p.o. daily.   PAST MEDICAL HISTORY:  1. Coronary artery disease with history of non-ST elevation MI in 2008      in the setting with epistaxis and anemia.  Cardiac catheterization      demonstrates 50% mid-LAD, 80% proximal RCA, 90% mid RCA with      ulcerated plaque and occluded right marginal branch, and a total      occlusion over the mid RCA with left-to-right collaterals.  2. Ischemic and nonischemic cardiomyopathy with left ventricular      ejection fraction of 20% on echocardiogram, October 06, 2008.  3. Hypertension.  4. Hyperlipidemia.  5. Chronic renal insufficiency with baseline creatinine of 2.2.  6. Anemia.  7. Polysubstance abuse.  8. Medical noncompliance.   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  He is single.  He has a 10 pack-year history of tobacco  use,  but quit 1 month ago.  He had quit alcohol use in 2008.  He denies  illicit drug use, but his drug screen is positive for marijuana today.   FAMILY HISTORY:  His mother deceased at age 29 of diabetes and  hypertension.  His father is deceased in his 22s when the patient was  age 14; he does not know the cause.   A complete review of systems was performed and was negative except as  per HPI.   PHYSICAL EXAMINATION:  VITAL SIGNS:  Temperature is 98.2, pulse 87,  respiratory rate 22, blood pressure is 202/145, and oxygen saturation is  100% on 2 L nasal cannula.  GENERAL:  This is a very pleasant 45 year old African American male in  no acute distress.  EYES:  Extraocular muscles intact.  Sclerae are muddy.  ENT:  Oropharynx clear.  Nares are clear  NECK:  Supple without lymphadenopathy.  Jugular venous pulse is  elevated.  CARDIOVASCULAR:  Heart rate is regular with normal S1 and S2.  There is  an S4.  There is no murmur or rub.  He has a laterally displaced PMI.  Pulses are 2+ and symmetric x4 extremities.   Normal carotid upstrokes.  LUNGS:  There is bilateral lower lobe rales up one-half of the lung  field, posteriorly with no increased work of breathing.  SKIN:  There are no rashes or lesion.  The patient has multiple tattoos.  ABDOMEN:  Soft and nontender with normoactive bowel sounds.  No rebound,  guarding, or mass.  No hepatosplenomegaly.  GU AND RECTAL:  Deferred.  EXTREMITIES:  No clubbing, cyanosis, no edema.  No rash, lesion, or  petechiae.  MUSCULOSKELETAL:  No joint deformities.  No CVA tenderness.  NEURO:  Alert and oriented x3.  His cranial nerves II through XII are  grossly intact.  No focal deficits.   RADIOLOGY:  A 2-D chest x-ray with pulmonary edema, right greater than  left, and cardiomegaly.   EKG, normal sinus rhythm at a rate of 85.  There is LVH present and  lateral T-wave flattening with no significant changes compared to December 16, 2008.   LABORATORY DATA:  CK-MB 2.0, troponin less than 0.05.  BMP is greater  than 3200, white count is 8.1, hemoglobin is 12, hematocrit 36,  platelets 323, sodium 138, potassium 4.5, chloride 107, bicarb 23, BUN  29, creatinine 2.2, glucose is 99.  His urine drug screen is positive  for opiates and marijuana.   ASSESSMENT AND PLAN:  A 45 year old African American male with history  of coronary artery disease and admission for hypertensive urgency  presenting again with left-sided chest pain and hypertensive urgency.  1. Hypertensive urgency/chest pain.  The patient's blood pressure      remains uncontrolled in the emergency department on a nitroglycerin      drip.  Titrate nitro drip for blood pressure control as well as      give his evening blood pressure medicines which he missed last      evening.  I will increase his Coreg dose to 25 mg p.o. b.i.d. as he      clearly is uncontrolled at home, though his history of medical      noncompliance does raise the issue of whether he is actually taking      the blood pressure  medicines.  We will continue his aspirin but      will not start heparin at this time due to ongoing significantly  elevated diastolic blood pressure.  I cannot see if the patient has      had a renal artery ultrasound in the past and we will obtain one to      evaluate for renal artery stenosis.  We will also cycle cardiac      biomarkers and check a TSH and lipid panel.  We will order CHF      teaching, 2-g sodium restricted diet daily restrict diet, 1500 mL      fluid restrict.  We will maintain K greater than 4 and mag greater      than 2.  Follow serial EKGs and monitor on telemetry.  2. Chronic renal insufficiency.  The patient's creatinine currently is      at baseline.  3. Polysubstance abuse.  We will offer substance abuse counseling.   DISPOSITION:  Anticipate discharge home once medically stable.      Sarajane Marek, MD  Electronically Signed     HH/MEDQ  D:  04/26/2009  T:  04/26/2009  Job:  [id number]

## 2011-01-16 NOTE — Consult Note (Signed)
NAMEJAMILLE, [name]             ACCOUNT NO.:  [id number]   MEDICAL RECORD NO.:  [id number]          PATIENT TYPE:  INP   LOCATION:  A309                          FACILITY:  [facility]   PHYSICIAN:  Gerrit Friends. Dietrich Pates, MD, FACCDATE OF BIRTH:  [birth year]   DATE OF CONSULTATION:  10/06/2008  DATE OF DISCHARGE:                                 CONSULTATION   REFERRING PHYSICIAN:  Dr. Rito Ehrlich of the [facility] Hospitalist Team P.   CARDIOLOGIST:  Dr. Rollene Rotunda who sees the patient in [place].   PRIMARY CARE PHYSICIAN:  None.  He was recently attempting to establish  with the [facility] Department.   REASON FOR CONSULTATION:  Elevated troponin.   HISTORY OF PRESENT ILLNESS:  [name] is a 45 year old male with a  history of medical noncompliance and chronic systolic congestive heart  failure secondary to ischemic cardiomyopathy with an EF of 25% and  hypertension.  He has been admitted on several occasions to [facility] secondary to noncompliance with hypertensive medications  resulting in hypertensive crisis.  He was admitted yet again on October 04, 2008, with a repeat episode of his prior admissions.  His blood  pressure was 180/145.  He had some edema on his chest x-ray and was  diuresed.  He had a D-dimer that was elevated and V/Q scan was performed  that was felt to be low probability for pulmonary embolism.  The patient  was placed back on his usual medications and his blood pressure  improved.  He was actually close to being discharged to home but  developed recurrent chest pain last night with elevations in his  troponin.  Initially, his troponins were 0.29, 0.26, 0.26.  This was  felt to be secondary to his hypertensive crisis, CHF, and chronic kidney  disease.  However, last night he developed diaphoresis and left arm pain  with his chest pain.  He noted chest pain really only with cough.  He  denied shortness of breath or nausea.  Repeat  troponins increased to a  peak of 1.56.   PAST MEDICAL HISTORY:  1. Coronary artery disease.      a.     History of non-ST-elevation myocardial infarction, April       2008, in the setting of profound epistaxis and anemia.  Cardiac       catheterization at that time demonstrated a 50% mid left       circumflex lesion.  His RCA was severely diseased with 80%       proximal disease in 3 separate locations, 90% mid disease with       ulcerated plaque, RV marginal totally occluded, and the mid RCA       was totally occluded with left-to-right collaterals to the distal       RCA.  Medical therapy was pursued at that time.  2. Echocardiogram, August 2009, EF 25%, apical/posterior akinesis,      inferior akinesis, moderate LVH, mild MR, mild decreased RV      function.  3. Hypertension.  4. Hyperlipidemia.  5. Chronic kidney disease with  average creatinine of about 2.3.  6. Chronic systolic congestive heart failure.  7. Ischemic cardiomyopathy.  8. Anemia.   MEDICATIONS AT HOME:  1. Labetalol 100 mg 4 times a day.  2. Amlodipine 10 mg a day.  3. Clonidine 0.3 mg 3 times a day.  4. Aspirin 325 mg daily.  5. Lasix 40 mg b.i.d.  6. Simvastatin 20 mg q.h.s.  7. Hydralazine 50 mg b.i.d.  8. Nu-Iron 150 mg daily.  9. Nitroglycerin p.r.n.   ALLERGIES:  NO KNOWN DRUG ALLERGIES.   SOCIAL HISTORY:  The patient lives in [place].  He is an ex-smoker.  He  denies alcohol abuse.  He denies any drug abuse.   FAMILY HISTORY:  Significant for diabetes and hypertension in his mother  who is deceased.   REVIEW OF SYSTEMS:  Please see HPI.  He denies fevers, chills, weight  changes, headaches, dysuria, hematuria, bright red blood per rectum, or  melena.  He has had a nonproductive cough.  Denies edema.  He does note  dyspnea with exertion.  Rest of the review of systems are negative.   PHYSICAL EXAM:  He is a well-nourished, well-developed male in no acute  stress.  Blood pressure 103/69.  Pulse  63.  Respirations 13.  Temperature 99.6.  T-max 100.1.  oxygen saturation 95% on 4 L.  HEENT:  Normal.  NECK:  With minimal JVD.  LYMPH:  Without lymphadenopathy.  ENDOCRINE:  Without thyromegaly.  CARDIAC:  Normal S1 and S2.  Regular rate and rhythm.  No murmur.  LUNGS:  With decreased breath sounds bilaterally.  No wheezing, no  rhonchi, no rales.  SKIN:  Without rash.  ABDOMEN:  Soft, nontender with normoactive bowel sounds.  No  organomegaly.  EXTREMITIES:  Without clubbing, cyanosis, or edema.  MUSCULOSKELETAL:  Without joint deformity.  NEUROLOGIC:  He is alert and oriented x3.  Cranial nerves II-XII grossly  intact.  VASCULAR EXAM:  Without carotid bruits bilaterally.   Chest x-ray on October 05, 2008, revealed improved pulmonary vascularity  and bilateral atelectasis, new tiny right effusion, no residual edema.  V/Q scan as outlined above.  EKG, normal sinus rhythm with a heart rate  of 60, poor R-wave progression, inferior Q-waves, T-wave inversions one  in aVL and V2-6, normal axis, interventricular conduction delay, LVH,  QTC of 570 ms.   LABS:  White count 5200, hemoglobin 10.5, hematocrit 31.4, platelet  count 202,000, sodium 132, potassium 3.9, BUN 29, creatinine 2.37 which  is down from 2.88, glucose 117, albumin 2.6, CK 82, 141, 133.  CK-MB  1.4, 4.5, 3.5.  Troponin-I 0.2, 1.1, 1.56.  Urine drug screen positive  for opiates and THC.   ASSESSMENT:  1. A 45 year old male who is noncompliant with his medications with a      history of ischemic cardiomyopathy with an EF of 25%, coronary      artery disease with severe RCA disease, and cardiac catheterization      in April of 2008 treated medically, now with chest symptoms and      elevated cardiac markers in the setting of recent hypertensive      crisis secondary to medical noncompliance.  His symptom complex and      abnormal lab values are certainly concerning for acute coronary      syndrome.  2. Chronic kidney  disease.  3. Chronic systolic congestive heart failure.  4. Hypertension, improved.  5. Hyperlipidemia.  6. Normocytic anemia.  7. Marijuana use.  8.  Noncompliance.  9. Low-grade fever.   RECOMMENDATIONS:  The patient was also interviewed and examined by Dr.  Coalinga Bing.  The patient has presented with a repeat of multiple  prior admits off of his hypertensive medications leading to hypertensive  crisis and mild troponin elevation.  His troponins were already  increasing last night at the time of his symptoms.  His troponin has  peaked at 1.56.  He has developed some T-wave inversions in one aVL and  V2-6 but this is similar to tracings dated June 09, 2008, and December 16, 2006.  Progression of CAD is certainly possible but fairly unlikely  over the last 18 months.  With his baseline creatinine of 2.5, risk of  proceeding with cardiac catheterization is increased.  For now, we are  concerned but not convinced of that he has suffered a non-ST-elevation  myocardial infarction.  We will continue to treat him medically at this  time.  Clopidogrel will be added in the hospital but there is no point  in attempting to maintain him on this post discharge given his prior  history.  He will be continued on heparin, aspirin, beta-blocker,  nitroglycerin, and statin.  Stool for blood will be checked given his  anemia.  A 2D echocardiogram will also be obtained.  Thank you very much  for the consultation.  We will be glad to follow the patient throughout  the remainder of this admission.      Tereso Newcomer, PA-C      Gerrit Friends. Dietrich Pates, MD, [facility]  Electronically Signed    SW/MEDQ  D:  10/08/2008  T:  10/08/2008  Job:  [id number]   cc:   Dr. Townsend Roger Hospitalist Team P

## 2011-01-16 NOTE — Consult Note (Signed)
NAMESCOTT, [name]             ACCOUNT NO.:  [id number]   MEDICAL RECORD NO.:  [id number]          PATIENT TYPE:  INP   LOCATION:  IC03                          FACILITY:  [facility]   PHYSICIAN:  Thomas C. Wall, MD, FACCDATE OF BIRTH:  1966-02-21   DATE OF CONSULTATION:  07/26/2008  DATE OF DISCHARGE:                                 CONSULTATION   REFERRING PHYSICIAN:  Lonia Blood, MD   We were asked by Dr. Mikeal Hawthorne of the [facility] team to consult concerning  Leonia Reader and presentation with chest pain and hypertensive  urgency.   Mr. Albers is a 45 year old black male, well-known to our service, who  has an ischemic cardiomyopathy and poorly-controlled hypertension.  He  is very noncompliant and though his medication regimen has been made  mostly generic, he is still runs out of his medicines.   He ran out of his medicines about 5 days prior to admission.  He  presented with sharp step chest pain and some shortness of breath.   His blood pressure was initially around 208/152 in the emergency room.  His pulse was 94 and regular, his sat was 95%, respiratory rate was 20.   He was not in any pulmonary edema, but his BNP rates were more than  5000.   He has had similar presentations like this in the past and recent past.   PAST MEDICAL HISTORY:  No known drug allergies.   His medication regimen is supposed to be labetalol 100 mg p.o. q.i.d.,  amlodipine 10 mg a day, clonidine 0.3 b.i.d., aspirin 325 mg, Lasix 40  mg twice a day, simvastatin 20 mg a day, hydralazine 50 mg q.i.d., Nu-  iron 150 b.i.d., nitroglycerin p.r.n.   SOCIAL HISTORY:  He is currently living in [place].  He denies any  alcohol or drug use.  He still smokes about a pack cigarettes a day.   FAMILY HISTORY:  Noncontributory.   REVIEW OF SYSTEMS:  A 12-point review of system is negative except for  the HPI.  He denies any orthopnea, PND, or peripheral edema.   PHYSICAL EXAMINATION:  VITAL SIGNS:  Today  in the ICU, his vital signs  have markedly improved with a blood pressure of 132/90.  He just came  off of an IV nitro drip.  His respiratory rate is 18, his sats 98% on 2  liters.  He is afebrile.  HEENT:  Normocephalic, atraumatic.  Sclerae are muddy.  Facial symmetry  is normal.  SKIN:  Warm and dry.  NECK:  Supple.  Carotid upstrokes are equal bilaterally without bruits.  No JVD.  Thyroid is not enlarged.  Trachea is midline.  LUNGS:  Clear to  auscultation and percussion.  HEART:  Normal S1 and S2.  No gallop.  ABDOMEN:  Soft, good bowel sounds.  No midline bruit.  No thyromegaly.  EXTREMITIES:  No cyanosis, clubbing, or edema.  Pulses are intact.  NEURO:  Intact.  SKIN:  Unremarkable.   LABORATORY DATA:  His cardiac markers were positive for troponins of  0.12 and 0.14.  His CPK-MB is negative.  His BNP  as mentioned is greater  than 5000.  Hemoglobin was 10.3 creatinine is 2.13, which is stable,  potassium is 3.6.  Note that his BNP on July 08, 2008, was greater  than 32,000 in October was 1660.   ASSESSMENT:  1. Medical noncompliance resulting in repetitive episodes of      hypertensive crises associated with acute-on-chronic systolic heart      failure.  His chest pain is noncoronary nature and history.  His      mild increase in troponins are related to most likely his      hypertension and his renal insufficiency.  His CPK-MBs are normal      and not elevated.  2. Ischemic cardiomyopathy is 20-30%.  His last echo August 2009, EF      of 25% with moderate LVH, mild RV dysfunction.  3. Coronary artery disease.  He has a non-ST-elevation myocardial      infarction April 2008 in the setting of epistaxis and anemia.  He      has severe right coronary artery disease with multiple lesions      proximal and mid with ulcerated plaque in a totally occluded right      ventricular marginal branch.  His mid right coronary artery is also      total with left-to-right collaterals.   Circumflex had a 50% mid      stenosis.  He has been treated medically.  4. Hypertension, poor control secondary to medical noncompliance.  5. Hyperlipidemia.  6. Chronic kidney disease in the setting of mild mitral regurgitation.  7. Tobacco use.   Mr. Silvernail now presented in September, October, and now in November  with hypertensive crisis or urgency with acute-on-chronic systolic heart  failure and chest pain.   Looking at his medical program, he is on mostly generics at this point,  which he can get and has been instructed to obtain at [facility] at a low  prize.  In fact we are asking where he gets his medications, he indeed  says [address], [place].   I would change his labetalol to 200 mg b.i.d. instead of 100 q.i.d.  He  has maxed out on his amlodipine as well as his clonidine and hydralazine  at this point.  I really do not think I would make any more adjustments.  I just tried to reassure compliance.   Thank you for the consultation.  Unfortunately, I think despite our  efforts, she will be back in the near future with yet another need for  admission.      Thomas C. Daleen Squibb, MD, [facility]  Electronically Signed     TCW/MEDQ  D:  07/26/2008  T:  07/27/2008  Job:  [id number]   cc:   Delaney Meigs, M.D.  Fax: [phone]

## 2011-01-16 NOTE — H&P (Signed)
NAMEDAEVON, HOLDREN             ACCOUNT NO.:  [id number]   MEDICAL RECORD NO.:  [id number]          PATIENT TYPE:  INP   LOCATION:  IC07                          FACILITY:  [facility]   PHYSICIAN:  Margaretmary Dys, M.D.DATE OF BIRTH:  [birth year]   DATE OF ADMISSION:  06/09/2008  DATE OF DISCHARGE:  LH                              HISTORY & PHYSICAL   ADMITTING DIAGNOSES:  1. Hypertensive urgency.  2. Congestive heart failure.  3. History of poorly controlled hypertension with the patient with      noncompliance.  4. 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  5. History of dyslipidemia.  6. History of coronary artery disease status post non-ST elevation      myocardial infarction back in April 2008.  7. Recent history of tobacco and marijuana abuse.  The patient quit      about a month ago.  8. Acute-on-chronic renal insufficiency.   CHIEF COMPLAINT:  Shortness of breath and headaches.   HISTORY OF PRESENT ILLNESS:  Mr. Skalicky is a 45 year old African-  American male with ischemic and nonischemic cardiomyopathy.  The patient  has a history of hypertension and has had multiple admissions in the  past due to poorly controlled hypertension.  It appears that the  patient's hypertension has been mostly uncontrolled due to noncompliance  with medications rather than an actual difficulty in bringing down the  blood pressure.  The patient says his noncompliance is attributable to  the fact that he is unable to afford some of his medications sometimes.   The patient says he is on Medicaid, although he has to come up with some  copay.  He is not on disability yet.  The patient was seen in the  emergency room because of shortness of breath.  The patient was found to  be in congestive heart failure and was complaining of some mild  headache.  He denies any visual symptoms.  He denies any chest pain.   He  denies any abdominal pain.   The patient was recently in the hospital about 3 weeks ago and was  discharged home after 3 days of hospitalization for very similar  symptoms.  On evaluation in the emergency room, the patient was found to  have severely elevated systolic blood pressure, for which he was started  on intravenous nitroglycerin.  The patient also required Lopressor 5 mg  q5 minutes x3 for some improvement.   The patient had no seizure activity.  He denies any weakness in his arms  or legs.   REVIEW OF SYSTEMS:  A 10-point review of systems was otherwise negative  except as mentioned in the history of present illness above.   PAST MEDICAL HISTORY:  1. Ischemic cardiomyopathy and nonischemic cardiomyopathy, ejection      fraction 20-30%.  2. Hypertension.  3. Hyperlipidemia.  4. Chronic renal insufficiency with a baseline creatinine of 2.3.  5. Coronary artery disease with an ST elevation MI in April of 2008.  6. History of tobacco abuse.  7. Epistaxis with acute blood  loss.  8. Pneumonia.  9. Prior marijuana abuse.   ALLERGIES:  The patient denies any known drug allergies.   MEDICATIONS:  Medications during his last discharge from the hospital:  1. Labetalol 100 mg p.o. q.i.d.  2. Amlodipine 10 mg p.o. daily.  3. Clonidine 0.3 mg p.o. b.i.d.  4. Aspirin 325 mg p.o. once a day.  5. Lasix 40 mg p.o. b.i.d.  6. Simvastatin 20 mg p.o. daily.  7. Hydralazine 50 mg p.o. q.i.d.  8. Nu-Iron 150 mg p.o. b.i.d.  9. Nitroglycerin 0.4 mg sublingual p.r.n. for chest pain.   ALLERGIES:  NO KNOWN DRUG ALLERGIES.   FAMILY HISTORY:  Positive for mother with diabetes and hypertension.   SOCIAL HISTORY:  The patient be used in [place].  He quit smoking over a  month ago.  Also quit marijuana about a month ago.  Does not drink  alcohol.  He denies any illicit drug use.   PHYSICAL EXAMINATION:  GENERAL:  The patient was conscious.  Appears to  be depressed.  Not in acute  distress.  VITAL SIGNS:  Blood pressure on arrival here in the emergency room was  documented as 193/142, pulse of 86, respirations 20, temperature 98.2  degrees Fahrenheit, oxygen saturation was 98% on room air.  HEENT:  Normocephalic, atraumatic.  Oral mucosa was moist with no  exudates.  Pupils were equal and reactive to light and accommodation.  NECK:  Supple.  No JVD, no lymphadenopathy.  LUNGS:  Occasional crackles at the bases bibasally.  No wheezing or  rhonchi.  HEART:  S1-S2, regular.  No S3, S4, murmurs, gallops or rubs.  ABDOMEN:  Soft, nontender.  Bowel sounds positive.  No masses palpable.  Difficult to hear any bruit.  EXTREMITIES:  No pitting pedal edema.  No calf induration or tenderness  was noted.  CNS:  Grossly intact with no focal neurological deficits.   LABORATORY AND DIAGNOSTIC DATA:  Chest x-ray suggestive of congestive  heart failure.   White blood cell count 10.7, hemoglobin of 11, hematocrit 33.3, platelet  count was 252 with 81% neutrophils.  Sodium is 137, potassium 3.8,  chloride of 106, CO2 is 23, glucose 101, BUN of 31, creatinine was 2.71,  bilirubin is 1.5, AST 32, ALT of 18, calcium is 8.5.  Amylase of 61,  lipase was 17.  BNP was 2780.  Initial troponin was 0.13.   ASSESSMENT/PLAN:  Mr. Dollar is a 45 year old male with a history of  hypertension, poorly controlled, with multiple complications from his  hypertension including cardiomyopathy and renal insufficiency who  presents with the above diagnoses.  1. The patient will be admitted to the intensive care unit.  2. Will continue on nitroglycerin drip infusion.  3. Will resume all of his oral medications, as I do believe that this      should improve his blood pressure significantly.  4. I do think that we need to discontinue his clonidine, even though      this may be helping his blood pressure.  The patient is very      noncompliant, and he runs the risk of continued rebound       hypertension once he goes off his clonidine.  5. Will start the patient on Lovenox for DVT prophylaxis.  6. The patient does not have any significant evidence suggesting any      intracranial event at this time.  I will hold off any CT scan.  7. Will monitor his renal function closely.  We informed the patient      that his chronic renal failure may be worsening if he continues to      be noncompliant.  8. Will diurese him with Lasix 40 mg IV q.12h., as his chest x-ray is      showing early evidence of congestive heart failure.  9. Positive reinforcement was given to the patient on quitting smoking      and illicit drug use.   I have discussed the above plan for admission with him.  The patient  verbalized full understanding.      Margaretmary Dys, M.D.  Electronically Signed     AM/MEDQ  D:  06/09/2008  T:  06/09/2008  Job:  [id number]

## 2011-01-16 NOTE — Assessment & Plan Note (Signed)
[facility]                            CARDIOLOGY OFFICE NOTE   NAME:[name], [name]                      MRN:          [id number]  DATE:07/02/2007                            DOB:          [name]    PRIMARY:  None.   REASON FOR PRESENTATION:  Evaluate the patient with hypertension and  cardiomyopathy.   HISTORY OF PRESENT ILLNESS:  The patient returns for followup.  He is 46  years old.  He has been doing relatively well since I last saw him.  He  says he has been taking all of his medications.  His blood pressure is  much better than it was at last visit.  He has not been having any  headaches, pre-syncope, or syncope.  He has not been having any  palpitations.  He denies any chest discomfort, neck discomfort, arm  discomfort, activity-induced nausea, vomiting, or excessive diaphoresis.   PAST MEDICAL HISTORY:  Coronary artery disease (April 2008 non-ST-  elevation myocardial infarction, catheterization demonstrated  nonobstructive disease in the left system, there were tandem 80% lesions  in the right coronary artery and occluded mid right coronary vessel, the  patient was managed medically).  Cardiomyopathy (mixed ischemic and  nonischemic with an EF of 20-30% and marked left ventricular  hypertrophy).  Difficult to control hypertension.  Chronic renal  insufficiency.  Tobacco abuse.  Epistaxis and acute blood loss.  Anemia.  History of pneumonia.  History of marijuana use.   ALLERGIES:  None.   MEDICATIONS:  1. Simvastatin 80 mg daily.  2. Isosorbide 60 mg daily.  3. Carvedilol 25 mg b.i.d.  4. Lisinopril 40 mg b.i.d.  5. Clonidine 0.3 mg b.i.d.  6. Furosemide 40 mg b.i.d.   REVIEW OF SYSTEMS:  As stated in the HPI and otherwise negative for  other systems.   PHYSICAL EXAMINATION:  The patient is in no distress.  Blood pressure 150/108, heart rate 39 and regular, weight 190 pounds,  body mass index 29.  HEENT:  Eyelids  unremarkable.  Pupils are equal, round, and reactive to  light and accommodation.  Fundi are not visualized.  Oral mucosa  unremarkable.  NECK:  No jugular venous distension at 45 degrees, carotid upstroke  brisk and symmetric, no bruits, thyromegaly.  LYMPHATICS:  No cervical, axillary, or inguinal adenopathy.  LUNGS:  Clear to auscultation bilaterally.  BACK:  No costovertebral angle tenderness.  CHEST:  Unremarkable.  HEART:  PMI not displaced or sustained, S1 and S2 within normal limits,  no S3, no S4, no clicks, rubs, 2/6 apical systolic murmur radiating  slightly up the aortic outflow tract.  No diastolic murmurs.  ABDOMEN:  Flat, positive bowel sounds, normal in frequency and pitch, no  bruits, rebound, guarding.  No midline pulsatile masses, hepatomegaly,  splenomegaly.  SKIN:  No rashes, no nodules.  EXTREMITIES:  With 2+ pulses throughout, no edema, cyanosis, clubbing.  NEURO:  Grossly intact throughout.   EKG shows sinus bradycardia, rate 39, left ventricular hypertrophy by  voltage criteria.   ASSESSMENT:  1. Hypertension.  Blood pressure is much better today.  This  indicates      to me that he is taking his medications.  We went over, for quite a      while, the importance of this.  He understands that he is very high      risk for adverse cardiovascular events, and in particular, stroke      and worsening renal function and heart failure.  I think he is      starting to understand the severity of this and the need to take      his medications.  He does have a Medicaid card, so he can afford      these.  I am going to let him go back to work at [employer].  He will need Norvasc 5 mg daily added to his regimen.  2. Cardiomyopathy, as above.  He is on a target dose of lisinopril and      a target dose of Coreg.  I am not going to add an ARB, but instead      will use Norvasc.  He would probably benefit from hydralazine      nitrates, but I do not think  he would be able to comply with this      regimen.  3. Renal insufficiency.  He is due to see the nephrologist on November      15.  He is to have blood work drawn there as well.  I will defer to      them.  4. Coronary artery disease.  We will continue with risk reduction.  In      the future, we will get a lipid profile and see where he stands      with that.   FOLLOWUP:  I will see the patient back in about 1 month for the next med  titration.     Rollene Rotunda, MD, [facility]  Electronically Signed    JH/MedQ  DD: 07/02/2007  DT: 07/02/2007  Job #: [id number]

## 2011-01-16 NOTE — Discharge Summary (Signed)
[name], [name]             ACCOUNT NO.:  [id number]   MEDICAL RECORD NO.:  [id number]          PATIENT TYPE:  INP   LOCATION:  A310                          FACILITY:  [facility]   PHYSICIAN:  Margaretmary Dys, M.D.DATE OF BIRTH:  1965-11-13   DATE OF ADMISSION:  02/06/2008  DATE OF DISCHARGE:  06/07/2009LH                               DISCHARGE SUMMARY   DISCHARGE DIAGNOSES:  1. Severely poorly controlled hypertension, with hypertensive urgency      secondary to noncompliance.  2. Hypertensive urgency.  3. Congestive heart failure, with flash pulmonary edema.  4. Coronary artery disease, with severe disease in the right coronary      artery.   OTHER DIAGNOSES OF NOTE:  1. History of anemia, with epistaxis.  2. Chronic kidney disease.   DISCHARGE MEDICATIONS:  1. Norvasc 10 mg p.o. once a day.  2. Lasix 40 mg p.o. b.i.d.  3. Clonidine 0.3 mg p.o. b.i.d.  4. Coreg 25 mg p.o. b.i.d.  5. Aspirin 325 mg p.o. once a day.  6. Simvastatin 20 mg at bedtime.  7. Hydralazine 50 mg p.o. t.i.d.  8. Lisinopril, although it is unclear if this has been discontinued.   DIET:  Heart-healthy, low-salt diet.   SPECIAL INSTRUCTIONS:  The patient was strongly advised to be very  compliant with his medications.   FOLLOWUP:  The patient was advised to follow up with his cardiologist,  Dr. Rollene Rotunda from [place].   CONSULTATIONS OBTAINED:  Dr. Dietrich Pates, cardiology.  Reason for  consultation:  Severe hypertension.   HOSPITAL COURSE:  Mr. Ragas is a 45 year old male who has a history  of coronary artery disease and cardiac catheterization back in April  2008 which revealed severe single-vessel disease involving his right  coronary artery which was to be managed medically.  The patient has been  admitted to our hospital in the past year at least a couple of times,  most recently being last month due to complaints of severe chest pain,  hypertension, and congestive heart failure.   The patient reports that he  was in his usual state of health, the patient works at [employer], when he  developed some left-sided chest pain and shortness of breath.  The  patient went home.  However, the symptoms increased in intensity.  He  then decided to come into the hospital.  The patient reports that he had  run out of his home medications about 2 days ago and did not refill the  prescriptions.  He described his pain as 8/10 in intensity, with a dull,  achy kind of pain with no radiation, no shortness of breath.  He denies  any nausea or vomiting.  The patient was then admitted to the intensive  care unit because his blood pressure on admission was 180/137.  The rest  of his vitals were unremarkable.  The patient was started on intravenous  nitroglycerin, which helped improve his blood pressure and also relieved  his chest pain.  The patient was also placed on Lasix intravenously.  All his home medications were started, except for lisinopril, for which  it was  unclear the status of its use by the patient.   The patient improved, with no concerns.  On February 07, 2008, when I saw the  patient, the patient was doing much better.  His blood pressure had now  been under control.  He was transferred to the medical floor, and  telemetry was discontinued.  The patient was observed in the hospital  for another 24 hours.  I also had an extensive discussion regarding  recreational drug abuse and also compliance to his medications.  On February 08, 2008, when I saw the patient, the patient was doing much better, and  he was subsequently discharged home, with the medications mentioned  above.   DISPOSITION:  To home.      Margaretmary Dys, M.D.  Electronically Signed     AM/MEDQ  D:  03/25/2008  T:  03/25/2008  Job:  [id number]

## 2011-01-19 NOTE — Cardiovascular Report (Signed)
NAMEPARRISH, BONN             ACCOUNT NO.:  [id number]   MEDICAL RECORD NO.:  [id number]          PATIENT TYPE:  INP   LOCATION:  2018                         FACILITY:  [facility]   PHYSICIAN:  Veverly Fells. Excell Seltzer, MD  DATE OF BIRTH:  1965/11/28   DATE OF PROCEDURE:  12/18/2006  DATE OF DISCHARGE:                            CARDIAC CATHETERIZATION   PROCEDURE:  Selective coronary angiography.   INDICATION:  [name] is a 45 year old African-American male who  presented to the hospital with a posterior nose bleed and melena.  He  was found to have marked anemia.  He had syncope.  He has been  transfused and treated with nasal packing.  He also has been found to  have renal insufficiency with creatinine greater than 2, severe  cardiomyopathy with an ejection fraction in the range of 25%, and marked  hypertension.  He has had intermittent chest pain over the last several  weeks.  Over the course of the last day he has developed ongoing chest  pain and rising troponins.  We have been unable to make him pain-free  overnight and we elected to proceed with an emergent catheterization due  to his ongoing pain and troponin increasing from 0.2 to 1.2 and most  recently to 4.2.   Risks and indications of the procedure were explained to the patient.  Informed consent was obtained.  The right groin was prepped, draped and  anesthetized with 1% lidocaine.  Using the modified Seldinger technique  a 6-French sheath was placed in the right femoral artery.  Multiple  views of the left and right coronary arteries were taken using standard  preformed Judkins catheters.  At the conclusion of the case a StarClose  device was used to seal the right femoral arteriotomy.  All catheter  exchanges were performed over a guidewire.  There were no complications.   FINDINGS:  Aortic pressure is 125/83 with a mean of 102.   Coronary angiography:  The left main stem is widely patent.  It  trifurcates into  the LAD, left circumflex and a small intermediate  branch.   The LAD is a large-caliber vessel throughout its course.  It supplies  and wraps around the apex.  It gives off two proximal diagonal branches  followed by a third diagonal branch in its mid portion.  The LAD has  minor luminal irregularities throughout its proximal portion but no  significant angiographic disease.   The ramus intermedius is small diameter.  It is angiographically normal.   The left circumflex is very large.  It supplies a large portion of  myocardium.  The mid portion of the circumflex has a 50% smooth  stenosis.  The circumflex then gives off a large branching obtuse  marginal branch that has no significant angiographic disease.  There is  also a large second OM branch that has no significant disease.  True AV  groove circumflex is medium caliber and does not appear to have any high-  grade obstructive disease.   The right coronary artery is severely diseased.  The proximal portion of  the vessel has multiple areas  of 80% stenosis in at least three focal  areas.  The mid portion of the right coronary artery has an ulcerated  plaque with very high grade 90% stenosis.  There appears to be an  occluded RV marginal branch that arises from that portion of the vessel.  The true right coronary artery in its mid portion just at the location  of a large branch vessel appears to be occluded.  The distal right  coronary artery branches are fed from left-to-right collaterals.   ASSESSMENT:  1. Severe single-vessel disease involving the right coronary artery      with diffuse disease in the proximal portion of the vessel, an      occluded side branch, and what appears to be an occluded main      portion of the right coronary artery which is supplied from left-to-      right collaterals.  2. Moderate left circumflex disease.  3. Minimal left main and left anterior descending artery disease.   PLAN:  In the setting  of severe diffuse right coronary artery disease  with the presence of left-to-right collaterals and the patient's  increased bleeding risk as well as increased risk of contrast  nephropathy with his renal insufficiency, I elected to treat him  medically.  Will treat with IV narcotics, aspirin, statin and  nitroglycerin.  Mr. Rosero is hemodynamically stable and I think he  will ultimately respond well to medical therapy.      Veverly Fells. Excell Seltzer, MD  Electronically Signed     MDC/MEDQ  D:  12/18/2006  T:  12/18/2006  Job:  [id number]

## 2011-01-19 NOTE — Consult Note (Signed)
[name], [name]             ACCOUNT NO.:  [id number]   MEDICAL RECORD NO.:  [id number]          PATIENT TYPE:  INP   LOCATION:  2018                         FACILITY:  [facility]   PHYSICIAN:  Rollene Rotunda, MD, FACCDATE OF BIRTH:  12/16/1965   DATE OF CONSULTATION:  12/17/2006  DATE OF DISCHARGE:                                 CONSULTATION   PRIMARY CARE PHYSICIAN:  The patient reports none, but records indicate  Delaney Meigs, M.D.   PRIMARY CARDIOLOGIST:  New and will be Rollene Rotunda, MD, [facility].   CHIEF COMPLAINT:  Left ventricular dysfunction.   HISTORY OF PRESENT ILLNESS:  [name] is a 45 year old male with no  previous history of coronary artery disease.  He was admitted to  [facility] in February 2008 for pneumonia.  At that time, he was  told that his heart was enlarged and needed to follow up with that.  However, he has not done so.   Approximately 2 weeks ago, [name] began having daily nosebleeds.  He was aware that his blood pressure was elevated.  He also had melena  and hematemesis.  He was at his sister's house and had a syncopal  episode and was transported to [facility].  He was evaluated  there, and hemoglobin was found to be 5 with a hematocrit of 14.9.  He  was transfused and evaluated.  He was transferred to [facility]  on December 14, 2006.   Since admission to [facility], he was evaluated by ENT who  performed nasal endoscopy with cautery.  He was also evaluated by GI and  had an EGD which did not show a source of bleed, so the  source of his  anemia was felt to be epistaxis.  [name] was complaining of some  intermittent chest pain, and he was noted to have cardiac enlargement on  chest x-ray, so an echocardiogram was performed which showed an EF of  approximately 20%, and, therefore, cardiology was asked to evaluate him.   [name] complains of intermittent chest pain since he was in the  hospital in February for pneumonia.  He describes it as a tightness.  It  is on the right side of his chest.  There is no radiation.  He describes  an association with drinking coffee but not necessarily meals.  It does  not wake him up at night.  It has no clear association with exertion.  Occasionally it is brought on by stress or movement.  The duration his  extended, but he cannot define it further.  There is no change with deep  inspiration or palpation, but it is increased by certain positions  including twisting movements.  He received a medication which he  describes as a fairly good-size white pill with a line down the middle  at [facility] which he states relieved the pain in approximately 15  minutes.  We are unable to determine exactly what medication this was.   PAST MEDICAL HISTORY:  1. Hypertension (diagnosed in February 2008).  2. Ongoing tobacco use.  3. Renal insufficiency diagnosed in  February of 2008.  4. Acute blood loss anemia.  5. Epistaxis  6. Pneumonia in February 2008.   SURGICAL HISTORY:  He is status post EGD and nasal endoscopy with  cautery this admission.   ALLERGIES:  No known drug allergies.   MEDICATIONS PRIOR TO ADMISSION:  1. Labetalol 3 mg b.i.d.  2. Clonidine 0.2 mg b.i.d.  3. Hydralazine 24 mg 3 times a day.   CURRENT MEDICATIONS:  1. Guanfacine 3 mg nightly.  2. Minoxidil 5 mg daily  3. Protonix 40 mg b.i.d.   SOCIAL HISTORY:  He lives alone in [place] and works as a Financial risk analyst.  He has  approximately an 18-pack-year history of tobacco use and quit 3 weeks  ago.  He denies alcohol or drug abuse, but a urine drug screen was  positive for THC at [facility].  He states he is active, interacting with  his children, and he has an active job.  He also feels that he is under  a great deal of stress at times.   FAMILY HISTORY:  His mother is deceased, and she had a history of  hypertension and CVA.  There is no information on his father, but no   siblings have any history of premature coronary artery disease.   REVIEW OF SYSTEMS:  Is significant for epistaxis as well as melena and  hematemesis prior to admission.  He states that he was having problems  with dyspnea on exertion and syncope prior to admission as well.  He  coughs occasionally.  Chest pain as described above.  He admits to some  anxiety.  He has chronic arthralgias.  He has had no recent fever,  chills or other illnesses.  Review of Systems is otherwise negative.   PHYSICAL EXAMINATION:  VITAL SIGNS:  Temperature is 98.5, blood pressure  147/102, pulse 67, respiratory rate 20, O2 saturation 99% on room air.  GENERAL:  He is a well-developed, well-nourished African-American male  with slight anxiety.  HEENT:  His head is normocephalic and atraumatic with extraocular  movements intact.  Sclera clear. Nares without discharge.  NECK:  There is no lymphadenopathy, thyromegaly, bruit or JVD noted.  CARDIOVASCULAR:  Heart is regular in rate and rhythm with an S1-S2, and  a soft systolic murmur is noted.  Distal pulses are intact in all four  extremities.  LUNGS:  Noted few rales in the bases but generally clear.  SKIN:  All of his tattoos are well healed, and there are no rashes or  lesions noted.  ABDOMEN:  Soft and nontender with active bowel sounds.  EXTREMITIES:  There is no cyanosis, clubbing or edema noted.  MUSCULOSKELETAL:  Showed no joint deformity or effusions and no spine or  CVA tenderness.  NEUROLOGIC: He is alert and oriented.  Cranial nerves II-XII grossly  intact.   EKG:  Sinus rhythm, rate 65 with LVH and unchanged from an EKG dated  December 14, 2006.   Laboratory values on admission at [facility]:  Hemoglobin 5.0, hematocrit  14.9.  Today's hemoglobin 8.9, hematocrit 26.1, WBC 10.0, platelets 275.  Sodium 139, potassium 3.8, chloride 103, CO2 27, BUN 29, creatinine 2.08, glucose 110. Creatinine on admission 1.9. CK-MB negative x3.  Troponin I minimally  elevated with a peak of 0.21. BNP on April 12 was  1197 and on April 14 was 1148.   IMPRESSION:  1. Chest pain, atypical and doubt ischemic:  Continue to cycle enzymes      and will plan Myoview, exercise  versus adenosine depending on his      blood pressure, when acute issues resolve.  Symptomatic treatment      of non-anginal chest pain is recommended.  2. Cardiomegaly:  Blood pressure control is paramount has that is      likely the cause of his left ventricular dysfunction.  Ischemic      workup is ongoing, but this is likely nonischemic.  3. Hypertension:  Treat in the context of treating cardiomegaly;      therefore, discontinue minoxidil and resume labetalol.  This can be      up titrated as heart rate will allow.  It is unlikely that he will      be compliant with 3 times a day dosing of Imdur and hydralazine;      therefore, amlodipine is probably the next drug to add.  If his      creatinine stabilizes, ACE inhibitor could possibly be used.  He      occasionally has a sinus bradycardia in the 50s, so will not use      the clonidine but preferentially use the beta blocker.  4. Anemia,  renal insufficiency, and other medical issues are per      critical care medicine and the renal team.      Theodore Demark, PA-C      Rollene Rotunda, MD, [facility]  Electronically Signed    RB/MEDQ  D:  12/17/2006  T:  12/17/2006  Job:  [id number]

## 2011-01-19 NOTE — Discharge Summary (Signed)
NAMEKOREN, SERMERSHEIM             ACCOUNT NO.:  [id number]   MEDICAL RECORD NO.:  [id number]          PATIENT TYPE:  INP   LOCATION:  A216                          FACILITY:  [facility]   PHYSICIAN:  Dorris Singh, DO    DATE OF BIRTH:  05-Jan-1966   DATE OF ADMISSION:  10/07/2007  DATE OF DISCHARGE:  02/05/2009LH                               DISCHARGE SUMMARY   ADMISSION DIAGNOSES:  1. Chest pain.  2. Hypertension.  3. Renal insufficiency.  4. Coronary artery disease.  5. Congestive heart failure.   DISCHARGE DIAGNOSES:  1. Atypical chest pain.  2. Hypertension.  3. Renal insufficiency.  4. Coronary artery disease.  5. Congestive heart failure.  6. Illicit drug use.   The patient was seen by Dr. Dietrich Pates as well as Dr. Kristian Covey of  Nephrology, and his H&P was done by Dr. Lilian Kapur (please refer to that).   PRIMARY CARE PHYSICIAN:  Delaney Meigs, M.D.   To summarize, the patient was admitted to the [facility] for  chest pain.  Cardiology was consulted to see him.  He was started on a  nitroglycerin drip, which did not help; so he was switched to a  labetalol drip.  While he was here he also had some renal insufficiency,  and there was some concern if his CHF would be exacerbated; so he was  switched to intravenous Lasix to help keep him from getting fluid  overloaded.  The patient progressed; did not have any other problems.  He was seen by cardiology; they recommended increasing his medications  and his Norvasc, and he diuresed well.  His chest pain went away.  We  also replaced his potassium, and it was recommended on discharge that  the patient be seen as outpatient by nephrology.   DISCHARGE MEDICATIONS:  1. Furosemide 40 mg twice a day.  2. Isosorbide mononitrate 60 mg one tablet daily.  3. Coreg 25 mg one tablet twice daily.  4. NitroQuick 0.4 mg one tablet as needed for chest pain.  5. Simvastatin 80 mg one tablet at bedtime.  6. Clonidine 0.3 mg twice a  day.  7. Lisinopril 40 mg twice a day.  8. Kay Ciel 40 mEq one daily.  9. Norvasc 10 mg one daily.  10.Hydralazine 25 mg p.o. three times a day.   DISPOSITION:  To home.   CONDITION:  Stable.   FOLLOWUP:  He was recommended to follow up with several individuals that  include:  [facility] in 3-5 days, and also see the primary care  physician on call; which Delbert Harness, MD.  He was given an appointment on  October 16, 2007.  He was told to stop smoking.  He was placed on a  heart healthy diet and to increase activity slowly;  to avoid and stop  using cigarettes, alcohol and marijuana due to his current heart  condition.  Given the following medications,  and he is supposed to see  Dr. Antoine Poche in [place] and to see his kidney specialist then.      Dorris Singh, DO  Electronically Signed  CB/MEDQ  D:  11/12/2007  T:  11/12/2007  Job:  [id number]   cc:   Delaney Meigs, M.D.  Fax: [phone]

## 2011-01-19 NOTE — Discharge Summary (Signed)
NAMEZAIDEN, LUDLUM NO.:  [id number]   MEDICAL RECORD NO.:  [id number]          PATIENT TYPE:  INP   LOCATION:  3728                         FACILITY:  [facility]   PHYSICIAN:  Rollene Rotunda, MD, FACCDATE OF BIRTH:  03-Oct-1965   DATE OF ADMISSION:  12/14/2006  DATE OF DISCHARGE:  12/21/2006                               DISCHARGE SUMMARY   PRIMARY CARDIOLOGIST:  Rollene Rotunda, MD, [facility]. in [place].   PRIMARY CARE Kier Smead:  Delaney Meigs, M.D. in [place].   PRINCIPAL DIAGNOSIS:  Non-ST elevation myocardial infarction/coronary  artery disease.   SECONDARY DIAGNOSES:  1. Acute on chronic systolic congestive heart failure.  2. Hypertension.  3. Ongoing tobacco abuse.  4. Chronic renal insufficiency.  5. Acute blood loss anemia.  6. Epistaxis.  7. History of pneumonia in February 2008.  8. History of marijuana usage.   ALLERGIES:  NO KNOWN DRUG ALLERGIES.   PROCEDURES:  1. EGD with needle endoscopy and cautery secondary to epistaxis.  2. Cardiac catheterization.  3. A 2D echocardiogram.   HISTORY OF PRESENT ILLNESS:  A 45 year old [place] American male with no  prior cardiac history, who noted a 2-week history of nosebleeds and  subsequently had a syncopal episode requiring admission to [facility] on December 13, 2006.  There he was noted to have a hemoglobin of  5, with hematocrit of 14.9.  He was subsequently transfused and  transferred to [facility], under the care of the critical care  service.   HOSPITAL COURSE:  At [facility], he was evaluated by ENT, who  performed a nasal endoscopy with cautery secondary to his epistaxis.  He  was also seen by GI with an EGD showing no GI source of bleeding and/or  anemia.  Throughout his hospitalization, Mr. Marchena complained of  intermittent chest discomfort and a 2D echocardiogram was performed,  December 16, 2006, revealing an EF of 20-30% with severe diffuse LV  hypokinesis and  moderate to marked LVH.  The patient was seen by Dr.  Antoine Poche on December 17, 2006, secondary to his ongoing chest discomfort  and newly found cardiomyopathy.  We initially planned a noninvasive  ischemic evaluation, however, on December 18, 2006, the patient had  recurrent chest pain with upward elevation of troponin from 1.20 to  4.18.  The patient was taken urgently to the cath lab and  catheterization was performed revealing nonobstructive disease in the  left coronary tree with tandem 80% stenoses in the proximal RCA and then  an occluded mid RCA as well as an occluded RV branch.  The distal RCA  was filled by left-to-right collaterals.  It was felt that in the  setting of severe diffuse RCA disease with the presence of left-to-right  collaterals and high bleeding risk, that medical management was favored.  He was transferred to the [facility]'s service on December 18, 2006, following catheterization.  Post catheterization, he had a persistent anemia with a hemoglobin of 9,  hematocrit of 27, and he was transfused with 1 additional unit of packed  red blood cells.  The patient had  no recurrent chest pain and our  attention was turned to his hypertension which was difficult to control.  Our goal was to maintain him on medications that would both help his  heart failure, namely Coreg and ACE inhibitor therapy, while at the same  time would be affordable.  We have been able to titrate his medications  and discontinue more expensive ones such as Norvasc.  He will be  discharged home today in satisfactory condition.   DISCHARGE LABORATORY:  Hemoglobin 10, hematocrit 29.5, WBC 8, platelets  366.  Sodium 141, potassium 4.1, chloride 104, CO2 28, BUN 28,  creatinine 1.96, glucose 113.  Total bilirubin 1.1, alkaline phosphatase  38, AST 19, ALT 13, total protein 5, albumin 2.5, calcium 8.5. CK 241,  MB 15.3, troponin I 2.54.  BNP 873.  Total cholesterol 129,  triglycerides 52, HDL 41, LDL  78.  Iron 141, TIBC 310.   DISPOSITION:  The patient is being discharged home today in good  condition.   FOLLOWUP PLANS/APPOINTMENTS:  1. We will arrange for followup with Dr. Antoine Poche initially in our      [place] office in approximately two weeks and then subsequently      he will follow up in the [place] office.  2. He is to follow up with Azucena Fallen, PA at [facility] on Monday, December 23, 2006, at 2:30 p.m.  3. It is recommended he follow up with his primary care physician, Dr.      Lysbeth Galas, as previously scheduled.   DISCHARGE MEDICATIONS:  1. Aspirin 325 mg daily.  2. Simvastatin 80 mg q.p.m.  3. Clonidine 0.1 mg b.i.d.  4. Coreg 12.5 mg b.i.d.  5. Imdur 60 mg daily.  6. Lisinopril 10 mg daily.  7. Nitroglycerin 0.4 mg sublingual p.r.n. chest pain.   OUTSTANDING LAB STUDIES:  None.   DURATION OF DISCHARGE ENCOUNTER:  Forty-five minutes, including  physician time.      Nicolasa Ducking, ANP    ______________________________  Rollene Rotunda, MD, [facility]    CB/MEDQ  D:  12/21/2006  T:  12/21/2006  Job:  [id number]   cc:   Delaney Meigs, M.D.  Azucena Fallen, [place]

## 2011-01-19 NOTE — Consult Note (Signed)
NAMELEDON, WEIHE             ACCOUNT NO.:  [id number]   MEDICAL RECORD NO.:  [id number]          PATIENT TYPE:  INP   LOCATION:  2305                         FACILITY:  [facility]   PHYSICIAN:  Kaylyn Layer, M.D.  DATE OF BIRTH:  07/25/1966   DATE OF CONSULTATION:  DATE OF DISCHARGE:                                 CONSULTATION   SERVICE REQUESTING CONSULT:  Critical care medicine and  gastrointestinal.   REASON FOR CONSULTATION:  High blood pressure and renal insufficiency.   HISTORY OF PRESENT ILLNESS:  This patient is a 45 year old African  American male with a history of relatively new onset of hypertension.  He was transferred here from [facility] about 2 days ago for anemia  secondary to blood loss, coming from a hematemesis, epistaxis and  melena.  The patient states he was in good health until about 3 weeks  ago when he developed pneumonia and required hospitalization.  While he  was in the hospital, they noted his blood pressure to be quite severely  elevated and started him on clonidine, labetalol, and hydralazine.  The  patient left the hospital and was taking his blood pressure medicines as  prescribed.  Then he began having epistasis nearly every day for 2-3  weeks.  This evolved into frank hematemesis and melena and eventually he  passed out and returned to the ER for evaluation.  In the ER at  [facility], his hemoglobin was 5.0.  They transferred him to [facility]  because they do not have many GI doctors.  ENT also evaluated him here  and cauterized a blood vessel this morning, which had been bleeding  every time his blood pressure went up.  Patient's blood pressure  medicines were switched to guanfacine and minoxidil.  Currently, blood  pressures are slightly improved on this regimen.  He had an EGD done,  which was normal.  It was felt that the source of bleeding was from his  nose.   SOCIAL HISTORY:  The patient lives in [place] by himself.  He is a Training and development officer.  He  has 6 children.  He is a former smoker.  He quit 3 weeks ago, had an  18 pack year history.  Denies alcohol.  Denies drugs, but his UDS was  positive for marijuana at [facility].   FAMILY HISTORY:  Mom has hypertension.  No family history of kidney  disease.   REVIEW OF SYSTEMS:  Positive for above, as well as intermittent chest  pain and shortness of breath and dyspnea on exertion and cough.  Currently, no nausea, vomiting, diarrhea, melena, bright red blood per  rectum or hematemesis.  Patient has had a 15 pound weight loss over the  last year.   PHYSICAL EXAMINATION:  VITAL SIGNS:  Temp 98.6.  Pulse 59-79.  Respirations 15-21.  Blood pressure is currently 134/69, range has been  134-185/69-107.  O2 is 100% on room air.  Ins and outs over the last 24  hours 2160 in and 2450 out.  IN GENERAL:  This is an alert and oriented male in no acute distress.  HEENT:  Normocephalic, atraumatic.  Extraocular movements intact.  Muddy  sclerae.  Moist mucous membranes.  Oropharynx without erythema or  exudates.  NECK:  Supple.  No  lymphadenopathy.  No bruit.  No JVD.  HEART:  Regular rate and rhythm.  S1 and S2.  No S3.  No murmurs, rubs  or gallops.  Pulses are 2+ and equal in extremities.  LUNGS:  Faint bibasilar crackles, clear with cough.  SKIN:  No rashes or lesions.  ABDOMEN:  Soft, nontender, nondistended.  No rebound or guarding.  Normoactive bowel sounds.  EXTREMITIES:  No clubbing, cyanosis or edema.  MUSCULOSKELETAL:  No joint deformity.  No effusions.  No tenderness.  NEURO:  Alert and oriented x3.  Cranial nerves grossly intact.   LABS AND STUDIES:  EGD within normal limits.  Echo is pending.  EKG  significant for LVH.  BNP 1148.  Iron level 141.  T-Sats 45%.  White  blood count 8.1, hemoglobin 8.7, hematocrit 25.4, platelets 235.  Sodium  135, potassium 3.5, chloride 102, bicarb 26, BUN 37, creatinine 2.27,  platelets 121.   ASSESSMENT/PLAN:  45 year old male admitted with  acute blood loss anemia  and hypertension.  1. Hypertension.  Questionably acute on chronic.  Patient has a family      history and was a former smoker and also has left ventricular      hypertrophy on EKG.  We need to avoid __________  a beta-blocker on      his due to his history of syncope and bradycardia and ACE might be      a possibility in the future.  For now, we will continue to monitor      on minoxidil and guanfacine since these are relatively cheap and      cost is an issue for this patient.  2. Renal insufficiency.  His baseline is unknown.  His creatinine was      2.2 on December 06, 2006.  As far as the patient knows, he has no      history of renal problems or renal insufficiency.  This could be      from chronic hypertension, but we cannot rule out glomerular      disease.  We will look at his urine sediment down in the lab.  We      will check a renal ultrasound, T3, T4, anti-GBM, ANA, ESR, ASO,      SPEP, UPEP, spot protein-to-creatinine ratio.  3. Epistaxis/gastrointestinal bleed.  This has resolved status post      cautery.  Hemoglobin stable.  Normal EGD.  Of note, hematemesis was      likely from swallowed blood.  4. Anemia, secondary to blood loss, stable.  No recent bleeding.   We will continue to follow.  Thank you for your involvement in this  patient's care.      Kaylyn Layer, M.D.     KS/MEDQ  D:  12/16/2006  T:  12/16/2006  Job:  [id number]

## 2011-01-19 NOTE — Consult Note (Signed)
NAMELEANORD, THIBEAU NO.:  [id number]   MEDICAL RECORD NO.:  [id number]          PATIENT TYPE:  INP   LOCATION:  2305                         FACILITY:  [facility]   PHYSICIAN:  Kristine Garbe. Ezzard Standing, M.D.DATE OF BIRTH:  Sep 05, 1965   DATE OF CONSULTATION:  12/15/2006  DATE OF DISCHARGE:                                 CONSULTATION   REASON FOR CONSULTATION:  Evaluate patient with epistaxis.   BRIEF HISTORY:  [name] is a 45 year old gentleman with  hypertension and most recently diagnosed with acute anemia secondary to  apparent blood loss.  He has had a three-to-four-day history of left-  sided epistaxis.  He does not really give previous history of  significant nosebleeds.  He apparently developed acute anemia with  hemoglobin down to 6, underwent transfusions.  Patient has been seen by  GI and is planned to have endoscopy to rule out GI source of bleeding,  but I was consulted to evaluate nose for epistaxis.   On nasal exam, there is a small vessel on the left side of the septum  anteriorly that had minimal amount of bleeding with some bright red  blood, but this would cause only minimal blood loss.  This vessel was  cauterized using silver nitrate.  Nasoendoscopy was performed to  evaluate the posterior nasal passageway.  On nasoendoscopy, there was  some blood clot in the posterior nasal passageway; I was able to suction  and clear this out, but after clearing the blood clot really did not  identify any definite site or origin or active bleeding.  Visualization  was felt optimal, but again there is no active bleeding noted and could  not identify a definite site or origin of posterior nosebleed although  this gentleman could have had posterior bleeding also.  Nasal cavity was  otherwise clear, there were no masses, nasopharynx was clear on  nasoendoscopy.   IMPRESSION:  1. Acute anemia secondary to blood loss, questionable GI versus      epistaxis.  2. Small left anterior septal vessel cauterized at bedside using      silver nitrate.  Questionable posterior nosebleed although there is      no active bleeding going on presently.   RECOMMENDATIONS:  Would recommend proceeding with GI endoscopy to rule  out GI source of blood loss, as I am not sure the blood loss from the  nose would be significant enough to account for drop of hemoglobin down  to 6.  I will follow up with the patient if he has any further  nosebleeds and patient is discharged to home.  I gave him my office  number to follow up in the office p.r.n. any further epistaxis.           ______________________________  Kristine Garbe Ezzard Standing, M.D.     CEN/MEDQ  D:  12/15/2006  T:  12/15/2006  Job:  [id number]

## 2011-01-22 ENCOUNTER — Telehealth: Payer: Self-pay | Admitting: Cardiology

## 2011-01-22 NOTE — Telephone Encounter (Signed)
When was it mailed and to which address because I haven't received anything yet

## 2011-01-22 NOTE — Telephone Encounter (Signed)
Calling re paperwork she mailed to [place], checking on status

## 2011-01-31 NOTE — Telephone Encounter (Signed)
Paperwork brought to the [place] office to be filled out.  It has been completed and is waiting to be signed by the MD.  Once signed I will mail back to pt.

## 2011-02-01 NOTE — Telephone Encounter (Signed)
PAPERWORK SIGNED AND COMPLETED.  IT WAS LEFT IN THE [facility] OFFICE FOR PICK UP AS I WAS UNABLE TO REACH WIFE BY PHONE.  [facility] IN THE [facility] OFFICE HAS THE PAPERWORK AND WILL GIVE TO WIFE WHEN SHE RETURNS.

## 2011-02-16 ENCOUNTER — Telehealth: Payer: Self-pay | Admitting: Cardiology

## 2011-02-16 NOTE — Telephone Encounter (Signed)
Hochrein completed/Signed [place] Medicaid Program Form, Copy in Drawer, Copy Mailed to Pt @ [address] [place] [address] Original Mailed to  EDS Attn Prior Approval [phone] [place]ire [address] 02/16/11/km

## 2011-03-13 ENCOUNTER — Encounter: Payer: Self-pay | Admitting: Cardiology

## 2011-03-14 ENCOUNTER — Ambulatory Visit (INDEPENDENT_AMBULATORY_CARE_PROVIDER_SITE_OTHER): Payer: Medicaid Other | Admitting: Cardiology

## 2011-03-14 ENCOUNTER — Encounter: Payer: Self-pay | Admitting: Cardiology

## 2011-03-14 DIAGNOSIS — I5022 Chronic systolic (congestive) heart failure: Secondary | ICD-10-CM

## 2011-03-14 DIAGNOSIS — N189 Chronic kidney disease, unspecified: Secondary | ICD-10-CM

## 2011-03-14 DIAGNOSIS — E785 Hyperlipidemia, unspecified: Secondary | ICD-10-CM

## 2011-03-14 DIAGNOSIS — I10 Essential (primary) hypertension: Secondary | ICD-10-CM

## 2011-03-14 DIAGNOSIS — I2510 Atherosclerotic heart disease of native coronary artery without angina pectoris: Secondary | ICD-10-CM

## 2011-03-14 DIAGNOSIS — R55 Syncope and collapse: Secondary | ICD-10-CM | POA: Insufficient documentation

## 2011-03-14 NOTE — Assessment & Plan Note (Signed)
I will check an echocardiogram. This can be done when he is hospitalized.

## 2011-03-14 NOTE — Assessment & Plan Note (Signed)
I will check a basic metabolic profile as above.

## 2011-03-14 NOTE — Patient Instructions (Signed)
Please report to [facility] on Thursday March 22, 2011 when called by admitting.  Please come prepared to stay over night. Please continue your medications as listed.

## 2011-03-14 NOTE — Assessment & Plan Note (Signed)
His blood pressure is actually now much better probably because of compliance. He will continue the meds as listed.

## 2011-03-14 NOTE — Assessment & Plan Note (Signed)
The patient had pain similar to chronic pains he said past. He needs another cardiac catheterization. However, he would need to be hydrated overnight first. I have discussed with him the risk of renal insufficiency. I will check a basic metabolic profile. I offered him admission he wants to wait next week. He knows to call 911 should he have any recurrent symptoms. He will otherwise continue meds as listed.

## 2011-03-14 NOTE — Assessment & Plan Note (Addendum)
This is a new concerning problem. He would qualify for an ICD if as I suspect is yet still low. I think he is compliant now and would be a reasonable candidate for this.  Of note his orthostatics were negative.

## 2011-03-14 NOTE — Progress Notes (Signed)
HPI The patient presents for followup of his known coronary disease. Since I last saw him he has had more chest discomfort. He has chronic chest pain but seems to be having this more frequently every few days. It happens with exertion and occasionally at rest. He is moderate substernal it comes on and go spontaneously. He does get dyspneic with exertion but is not describing PND or orthopnea he does have some palpitations. Of concern he's had some syncopal episodes. He seemed to occur about morning and are not associated with other symptoms. They're not related to change in position. He has not had injury. He does report he is avoiding alcohol and other substances. Is not smoking cigarettes. He says he is much more compliant with diet. He states he always medications.  No Known Allergies  Current Outpatient Prescriptions  Medication Sig Dispense Refill  . amLODipine (NORVASC) 10 MG tablet Take 10 mg by mouth daily.        Marland Kitchen aspirin 325 MG tablet Take 325 mg by mouth daily.        . carvedilol (COREG) 25 MG tablet Take 25 mg by mouth 2 (two) times daily with a meal.        . ferrous gluconate (FERGON) 325 MG tablet Take 325 mg by mouth 2 (two) times daily.        . furosemide (LASIX) 40 MG tablet Take 40 mg by mouth 2 (two) times daily.        . hydrALAZINE (APRESOLINE) 50 MG tablet Take 50 mg by mouth 3 (three) times daily.        . isosorbide mononitrate (IMDUR) 60 MG 24 hr tablet Take 60 mg by mouth daily.        . pravastatin (PRAVACHOL) 40 MG tablet Take 40 mg by mouth every evening.        Marland Kitchen spironolactone (ALDACTONE) 25 MG tablet Take 12.5 mg by mouth daily.          Past Medical History  Diagnosis Date  . History of non-ST elevation myocardial infarction (NSTEMI) 12/2006    In the setting of porfound epistaxis and anemia  . CAD (coronary artery disease)   . Abnormal echocardiogram 04/2008    EF 25%, apical, posterior akinesis, inferior akinesis, moderate LVH, mild MR, mild decreased RV  function  . Hypertension   . Hyperlipidemia   . Chronic kidney disease     Average creatine of about 2.3  . Systolic CHF, chronic   . Ischemic cardiomyopathy   . Anemia     Past Surgical History  Procedure Date  . Cardiac catheterization 12/2006    Demonstrated 50% mid left circumflex lesion. RCA was severely diseased with 80% proximal disease in 3 seperate locations, 90% mid desiase with ulcerated plaque, RV marginal totally occluded and the mid RCA totally occluded with left to right collaterals to the distal RCA. Medical therapy was pursued at that time.    Family History  Problem Relation Age of Onset  . Hypertension Mother   . Diabetes Mother     History   Social History  . Marital Status: Legally Separated    Spouse Name: N/A    Number of Children: 6  . Years of Education: N/A   Occupational History  . Unemployed    Social History Main Topics  . Smoking status: Former Games developer  . Smokeless tobacco: Not on file  . Alcohol Use: No  . Drug Use: No  . Sexually Active: Not on file  Other Topics Concern  . Not on file   Social History Narrative   Lives in [place]    ROS:  As stated in the HPI and negative for all other systems.  PHYSICAL EXAM BP 134/78  Pulse 63  Resp 16  Ht 5\' 10"  (1.778 m)  Wt 209 lb (94.802 kg)  BMI 29.99 kg/m2 GENERAL:  Well appearing HEENT:  Pupils equal round and reactive, fundi not visualized, oral mucosa unremarkable NECK:  No jugular venous distention, waveform within normal limits, carotid upstroke brisk and symmetric, no bruits, no thyromegaly LYMPHATICS:  No cervical, inguinal adenopathy LUNGS:  Clear to auscultation bilaterally BACK:  No CVA tenderness CHEST:  Unremarkable HEART:  PMI not displaced or sustained,S1 and S2 within normal limits, no S3, no S4, no clicks, no rubs, soft systolic apical murmur ABD:  Flat, positive bowel sounds normal in frequency in pitch, no bruits, no rebound, no guarding, no midline pulsatile  mass, no hepatomegaly, no splenomegaly EXT:  2 plus pulses throughout, no edema, no cyanosis no clubbing SKIN:  No rashes no nodules NEURO:  Cranial nerves II through XII grossly intact, motor grossly intact throughout PSYCH:  Cognitively intact, oriented to person place and time  EKG:  Normal sinus rhythm, left upper level the criteria, LVH changes  ASSESSMENT AND PLAN

## 2011-03-19 ENCOUNTER — Emergency Department (HOSPITAL_COMMUNITY): Payer: Medicaid Other

## 2011-03-19 ENCOUNTER — Inpatient Hospital Stay (HOSPITAL_COMMUNITY)
Admission: EM | Admit: 2011-03-19 | Discharge: 2011-03-21 | DRG: 287 | Disposition: A | Discharge status: Home / Self Care | Payer: Medicaid Other | Attending: Cardiology | Admitting: Cardiology

## 2011-03-19 DIAGNOSIS — I5042 Chronic combined systolic (congestive) and diastolic (congestive) heart failure: Secondary | ICD-10-CM | POA: Diagnosis present

## 2011-03-19 DIAGNOSIS — I509 Heart failure, unspecified: Secondary | ICD-10-CM | POA: Diagnosis present

## 2011-03-19 DIAGNOSIS — I2589 Other forms of chronic ischemic heart disease: Secondary | ICD-10-CM | POA: Diagnosis present

## 2011-03-19 DIAGNOSIS — Z7982 Long term (current) use of aspirin: Secondary | ICD-10-CM

## 2011-03-19 DIAGNOSIS — R55 Syncope and collapse: Secondary | ICD-10-CM | POA: Diagnosis present

## 2011-03-19 DIAGNOSIS — I2510 Atherosclerotic heart disease of native coronary artery without angina pectoris: Secondary | ICD-10-CM | POA: Diagnosis present

## 2011-03-19 DIAGNOSIS — R079 Chest pain, unspecified: Secondary | ICD-10-CM

## 2011-03-19 DIAGNOSIS — I2582 Chronic total occlusion of coronary artery: Secondary | ICD-10-CM | POA: Diagnosis present

## 2011-03-19 DIAGNOSIS — Z79899 Other long term (current) drug therapy: Secondary | ICD-10-CM

## 2011-03-19 DIAGNOSIS — N183 Chronic kidney disease, stage 3 unspecified: Secondary | ICD-10-CM | POA: Diagnosis present

## 2011-03-19 DIAGNOSIS — Z87891 Personal history of nicotine dependence: Secondary | ICD-10-CM

## 2011-03-19 DIAGNOSIS — R0789 Other chest pain: Principal | ICD-10-CM | POA: Diagnosis present

## 2011-03-19 DIAGNOSIS — I129 Hypertensive chronic kidney disease with stage 1 through stage 4 chronic kidney disease, or unspecified chronic kidney disease: Secondary | ICD-10-CM | POA: Diagnosis present

## 2011-03-19 DIAGNOSIS — E785 Hyperlipidemia, unspecified: Secondary | ICD-10-CM | POA: Diagnosis present

## 2011-03-19 DIAGNOSIS — I428 Other cardiomyopathies: Secondary | ICD-10-CM | POA: Diagnosis present

## 2011-03-19 LAB — DIFFERENTIAL
Basophils Absolute: 0 10*3/uL (ref 0.0–0.1)
Basophils Relative: 1 % (ref 0–1)
Eosinophils Absolute: 0.4 10*3/uL (ref 0.0–0.7)
Lymphs Abs: 2.1 10*3/uL (ref 0.7–4.0)
Neutrophils Relative %: 61 % (ref 43–77)

## 2011-03-19 LAB — CBC
Platelets: 215 10*3/uL (ref 150–400)
RBC: 4.39 MIL/uL (ref 4.22–5.81)
WBC: 7.8 10*3/uL (ref 4.0–10.5)

## 2011-03-20 DIAGNOSIS — R072 Precordial pain: Secondary | ICD-10-CM

## 2011-03-20 DIAGNOSIS — I517 Cardiomegaly: Secondary | ICD-10-CM

## 2011-03-20 DIAGNOSIS — N19 Unspecified kidney failure: Secondary | ICD-10-CM

## 2011-03-20 LAB — BASIC METABOLIC PANEL
BUN: 29 mg/dL — ABNORMAL HIGH (ref 6–23)
Calcium: 9 mg/dL (ref 8.4–10.5)
Chloride: 106 mEq/L (ref 96–112)
Creatinine, Ser: 2.62 mg/dL — ABNORMAL HIGH (ref 0.50–1.35)
GFR calc Af Amer: 32 mL/min — ABNORMAL LOW (ref >60)
GFR calc Af Amer: 34 mL/min — ABNORMAL LOW (ref >60)
Glucose, Bld: 83 mg/dL (ref 70–99)
Potassium: 4.3 mEq/L (ref 3.5–5.1)
Sodium: 142 mEq/L (ref 135–145)

## 2011-03-20 LAB — CARDIAC PANEL(CRET KIN+CKTOT+MB+TROPI)
CK, MB: 2.1 ng/mL (ref 0.3–4.0)
Total CK: 135 U/L (ref 7–232)
Troponin I: <0.3 ng/mL (ref <0.30)

## 2011-03-20 LAB — TSH: TSH: 5.868 u[IU]/mL — ABNORMAL HIGH (ref 0.350–4.500)

## 2011-03-20 LAB — PRO B NATRIURETIC PEPTIDE: Pro B Natriuretic peptide (BNP): 1786 pg/mL — ABNORMAL HIGH (ref 0–125)

## 2011-03-20 LAB — TROPONIN I: Troponin I: <0.3 ng/mL (ref <0.30)

## 2011-03-20 LAB — CK TOTAL AND CKMB (NOT AT ARMC)
CK, MB: 2 ng/mL (ref 0.3–4.0)
Relative Index: 1.3 (ref 0.0–2.5)
Total CK: 152 U/L (ref 7–232)

## 2011-03-21 DIAGNOSIS — I2510 Atherosclerotic heart disease of native coronary artery without angina pectoris: Secondary | ICD-10-CM

## 2011-03-21 LAB — BASIC METABOLIC PANEL
CO2: 27 mEq/L (ref 19–32)
Calcium: 8.3 mg/dL — ABNORMAL LOW (ref 8.4–10.5)
Chloride: 101 mEq/L (ref 96–112)
Glucose, Bld: 100 mg/dL — ABNORMAL HIGH (ref 70–99)
Sodium: 137 mEq/L (ref 135–145)

## 2011-03-21 LAB — CARDIAC PANEL(CRET KIN+CKTOT+MB+TROPI): Total CK: 132 U/L (ref 7–232)

## 2011-03-22 ENCOUNTER — Observation Stay (HOSPITAL_COMMUNITY): Admission: RE | Admit: 2011-03-22 | Payer: Self-pay | Source: Ambulatory Visit | Admitting: Cardiology

## 2011-03-22 NOTE — Cardiovascular Report (Signed)
NAMEWHITTAKER, [name] NO.:  [name]  MEDICAL RECORD NO.:  [name]  LOCATION:  2807                         FACILITY:  [facility]  PHYSICIAN:  Veverly Fells. Excell Seltzer, MD  DATE OF BIRTH:  [birth year]  DATE OF PROCEDURE:  03/21/2011 DATE OF DISCHARGE:                           CARDIAC CATHETERIZATION   PROCEDURES: 1. Selective coronary angiography. 2. Catheter placement for selective coronary angiography.  PROCEDURAL INDICATIONS:  [name] is a 45 year old gentleman with known CAD.  He also has chronic kidney disease and a history of hypertensive cardiomyopathy.  He presented with chest pain.  The patient underwent an echocardiogram yesterday demonstrating improvement in his overall LV function.  His LVEF has previously been 35% and is now up to the 50-55% range.  He has marked LVH.  Because of his ongoing chest pain, he was referred for cardiac cath.  Risks and indications of the procedure were reviewed with the patient. Informed consent was obtained.  The right wrist was prepped, draped, and anesthetized with 1% lidocaine.  Using modified Seldinger technique, a 5- French sheath was placed in the right radial artery.  5000 units of unfractionated heparin was administered intravenously, 3 mg of verapamil was administered through the radial sheath.  Standard Judkins catheters were used for coronary angiography.  A pigtail catheter was advanced into the left ventricle to record left ventricular pressure.  However, the patient had marked ventricular ectopy and runs of nonsustained VT and the catheter had to be removed.  I reattempted to record LV pressure, but the same thing occurred.  I suspected that the patient had a small LV cavity based on his echo images and severe LVH.  No further attempts were made to record his left ventricular pressure.  He tolerated the procedure well.  There were no immediate complications.  A TR band was used for radial  hemostasis.  PROCEDURAL FINDINGS:  Aortic pressure is 119/77 with a mean of 96.  Right coronary artery:  The right coronary artery has total occlusion of the proximal vessel.  The distal right coronary artery branches are collateralized from the left coronary artery.  Left mainstem:  Widely patent, divides into the LAD and left circumflex. No obstructive disease.  LAD:  The LAD is widely patent as it courses down the anterior wall and wraps around the left ventricular apex.  The apical LAD supplies a well- formed collateral to the distal right coronary artery.  There are three diagonal branches.  The most distal diagonal branch is the largest in caliber.  The LAD has diffuse luminal irregularity with 25-30% stenosis. There is no significant obstructive disease throughout the course of the LAD.  Left circumflex:  The left circumflex has diffuse irregularity.  There are 30-50% stenosis in the midcircumflex.  There is no high-grade obstructive disease visualized.  The circumflex supplies a large obtuse marginal branch that gives off multiple sub-branches.  The obtuse marginal has diffuse irregularity, but again there is no significant stenosis visualized throughout the circumflex distribution.  FINAL ASSESSMENT: 1. Total occlusion of the proximal right coronary artery with left-to-     right collaterals supplying the distal right coronary artery     branches. 2. Mild diffuse  nonobstructive disease of the left anterior descending     and left circumflex.  RECOMMENDATIONS:  The patient will be treated with ongoing medical therapy for treatment of his coronary artery disease and hypertension.     Veverly Fells. Excell Seltzer, MD     MDC/MEDQ  D:  03/21/2011  T:  03/21/2011  Job:  [id number]  Electronically Signed by Tonny Bollman MD on 03/22/2011 12:44:46 AM

## 2011-03-26 ENCOUNTER — Other Ambulatory Visit: Payer: Medicaid Other | Admitting: *Deleted

## 2011-03-27 ENCOUNTER — Telehealth: Payer: Self-pay | Admitting: Cardiology

## 2011-03-27 NOTE — Telephone Encounter (Signed)
Pt's sister calling re papers she brought to you in [place], and hasn't received them back yet, calling for status

## 2011-03-27 NOTE — Telephone Encounter (Signed)
Spoke with [name].  Informed her that the original information was mailed 2 weeks ago.  We have a copy and will take it to the [place] office for her to pick up.

## 2011-03-30 NOTE — Discharge Summary (Signed)
[name] NO.:  [id number]  MEDICAL RECORD NO.:  [id number]  LOCATION:  6522                         FACILITY:  [facility]  PHYSICIAN:  Rollene Rotunda, MD, FACCDATE OF BIRTH:  February 27, 1966  DATE OF ADMISSION:  03/19/2011 DATE OF DISCHARGE:  03/21/2011                              DISCHARGE SUMMARY   PRIMARY CARDIOLOGIST:  Rollene Rotunda, MD, [facility]  PRIMARY CARE PROVIDER:  [place] [facility].  DISCHARGE DIAGNOSIS:  Chest pain without objective evidence of ischemia.  SECONDARY DIAGNOSES: 1. Hypertensive urgency. 2. Mixed ischemic and nonischemic cardiomyopathy with previous EF of     20% in February 2010, measured at 55-60% by echo this admission. 3. 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 4. Hyperlipidemia. 5. Stage III chronic kidney disease. 6. Remote tobacco and marijuana usage.  ALLERGIES:  No known drug allergies.  PROCEDURES: 1. Abdominal arterial duplex, revealing no evidence of renal artery     stenosis bilaterally.  The left renal artery is difficult to     visualize and it appears the left is prominent than the right.     Bilaterally kidneys were within normal limits for diameter.  No     evidence of other abnormalities.  Abdominal aorta, celiac axis, and     superior mesenteric arteries were patent without any evidence of     aneurysms, stenosis, or other abnormality. 2. A 2D echocardiogram, March 20, 2011, showing an EF of 55-60% with     normal wall motion and grade 1 diastolic dysfunction.  There was     moderate LVH.  Trivial aortic insufficiency.  The left atrium was     moderately-to-severely dilated while the right atrium was mildly     dilated. 3. Diagnostic cardiac catheterization performed on March 21, 2011,     revealing a chronic total occlusion of the proximal RCA with right-     to-left collaterals.  There was minimal diffuse nonobstructive CAD,     involving the LAD and left  circumflex.  Medical therapy was     recommended.  HISTORY OF PRESENT ILLNESS:  A 45 year old African American male with the above complex problem list who was in his usual state of health until the day of admission when he had sudden onset of intense, sharp left-sided chest discomfort that was worse with deep inspiration.  The patient presented to the ED where he was hypertensive with blood pressure 200/114, this improved with nitroglycerin infusion.  The patient reported compliance with his home medications.  He was admitted for the evaluation.  HOSPITAL COURSE:  The patient ruled out for MI, but continued to have sharp pleuritic chest discomfort.  A 2D echocardiogram was performed to rule out pericardial effusion with suspicion for pericarditis and this showed improved LV function with an EF of 55-60% and moderate LVH and grade 1 diastolic dysfunction.  No effusion was appreciated.  With a history of malignant hypertension, renal vascular ultrasound was performed and it showed patent bilateral renal arteries as above.  With ongoing intermittent chest pain, decision was made to pursue with catheterization.  With a history of stage III chronic kidney disease and baseline creatinine at 2.5 range, the patient  was initially hydrated with improvement to creatinine at 2.03 today.  Catheterization was performed this morning, showing chronic total occlusion of the right coronary artery with right-to-left collaterals and otherwise nonobstructive disease.  Continued medical therapy is warranted.  The patient will be discharged home later today in good condition.  We will plan to obtain a basic metabolic panel on March 26, 2011, to reevaluate creatinine following catheterization.  DISCHARGE LABS:  Hemoglobin 12.9, hematocrit 38.9, WBC 7.8, platelets 215.  ESR 214.  Sodium 137, potassium 3.8, chloride 101, CO2 27, BUN 27, creatinine 2.03, glucose 100, calcium 8.3.  CK 132, MB 2.1, troponin  I less than 0.30.  TSH 5.68.  C-reactive protein 0.6.  DISPOSITION:  The patient will be discharged home today in good condition.  FOLLOWUP PLANS AND APPOINTMENTS:  The patient will follow up with Dr. Antoine Poche in our [place] office on May 02, 2011,  at 12 p.m.  Follow up with [facility] in the next 3-4 weeks.  He has been provided with a prescription to have a basic metabolic panel reevaluated on March 26, 2011, with primary care.  DISCHARGE MEDICATIONS: 1. Spirolactone 25 mg daily. 2. Carvedilol 25 mg 1/2 tablet b.i.d. 3. Amlodipine 10 mg daily. 4. Aspirin 81 mg daily. 5. Furosemide 40 mg b.i.d. 6. Hydralazine 50 mg t.i.d. 7. Imdur 60 mg daily. 8. Nitroglycerin 0.4 mg for p.r.n. chest pain. 9. Pravastatin 40 mg at bedtime.  OUTSTANDING LAB STUDIES:  Followup basic metabolic panel on March 26, 2011.  DURATION OF DISCHARGE ENCOUNTER:  60 minutes including physician time.     Nicolasa Ducking, ANP   ______________________________ Rollene Rotunda, MD, [facility]   CB/MEDQ  D:  03/21/2011  T:  03/21/2011  Job:  [id number]  cc:   [facility]  Electronically Signed by Nicolasa Ducking ANP on 03/26/2011 10:37:49 AM Electronically Signed by Rollene Rotunda MD [facility] on 03/30/2011 07:50:06 PM

## 2011-04-09 ENCOUNTER — Encounter: Payer: Medicaid Other | Admitting: Physician Assistant

## 2011-04-18 ENCOUNTER — Telehealth: Payer: Self-pay | Admitting: *Deleted

## 2011-04-18 DIAGNOSIS — I10 Essential (primary) hypertension: Secondary | ICD-10-CM

## 2011-04-18 MED ORDER — CLONIDINE HCL 0.1 MG PO TABS: ORAL_TABLET | ORAL | Status: DC

## 2011-04-18 NOTE — Telephone Encounter (Signed)
Per call from pt's sister - [facility] Efraim Kaufmann Julien Girt is in the home.  She reports pts BP is 240/140 HR 118 at rest.  Per report of pt, he has taken all medications as prescribed at discharge from hospital Spirolactone because he is out of it.  Dr Rollene Rotunda aware and has given orders for pt to take Clonidine 0.1 mg one every 2 hours until BP is below 200 systolic and 100 diastolic.  Rx will be sent into [facility] as requested by sister.

## 2011-04-20 ENCOUNTER — Other Ambulatory Visit: Payer: Self-pay | Admitting: *Deleted

## 2011-04-20 MED ORDER — ISOSORBIDE MONONITRATE ER 60 MG PO TB24: 60.0000 mg | ORAL_TABLET | Freq: Every day | ORAL | Status: DC

## 2011-04-20 MED ORDER — CARVEDILOL 25 MG PO TABS: 25.0000 mg | ORAL_TABLET | Freq: Two times a day (BID) | ORAL | Status: DC

## 2011-04-20 MED ORDER — FUROSEMIDE 40 MG PO TABS: 40.0000 mg | ORAL_TABLET | Freq: Two times a day (BID) | ORAL | Status: DC

## 2011-04-20 MED ORDER — HYDRALAZINE HCL 50 MG PO TABS: 50.0000 mg | ORAL_TABLET | Freq: Three times a day (TID) | ORAL | Status: DC

## 2011-04-20 MED ORDER — AMLODIPINE BESYLATE 10 MG PO TABS: 10.0000 mg | ORAL_TABLET | Freq: Every day | ORAL | Status: DC

## 2011-04-20 MED ORDER — PRAVASTATIN SODIUM 40 MG PO TABS: 40.0000 mg | ORAL_TABLET | Freq: Every evening | ORAL | Status: DC

## 2011-05-01 ENCOUNTER — Encounter: Payer: Self-pay | Admitting: Cardiology

## 2011-05-02 ENCOUNTER — Ambulatory Visit (INDEPENDENT_AMBULATORY_CARE_PROVIDER_SITE_OTHER): Payer: Medicaid Other | Admitting: Cardiology

## 2011-05-02 ENCOUNTER — Encounter: Payer: Self-pay | Admitting: Cardiology

## 2011-05-02 DIAGNOSIS — I2510 Atherosclerotic heart disease of native coronary artery without angina pectoris: Secondary | ICD-10-CM

## 2011-05-02 DIAGNOSIS — I10 Essential (primary) hypertension: Secondary | ICD-10-CM

## 2011-05-02 DIAGNOSIS — I5022 Chronic systolic (congestive) heart failure: Secondary | ICD-10-CM

## 2011-05-02 MED ORDER — NITROGLYCERIN 0.4 MG SL SUBL: 0.4000 mg | SUBLINGUAL_TABLET | SUBLINGUAL | Status: DC | PRN

## 2011-05-02 NOTE — Progress Notes (Signed)
HPI The patient presents for followup after hospitalization in July.  At that time to see actually better than previous it reported below. Catheterization demonstrated unchanged coronary anatomy. He had chest pain but without clear cardiac etiology. His blood pressure disease he did control in the hospital. At home his blood pressures and shot per his home health nurse. However, I question whether these always compliant with medications. He says he has significant stress and he seems depressed. He continues to have some sharp chest pains worse with deep breathing occasionally. He has some tingling in his right arm. He is not describing some sternal chest pressure, neck or left arm discomfort. He is not describing PND or orthopnea. He is not reporting new palpitations, presyncope or syncope.  No Known Allergies  Current Outpatient Prescriptions  Medication Sig Dispense Refill  . amLODipine (NORVASC) 10 MG tablet Take 1 tablet (10 mg total) by mouth daily.  30 tablet  6  . aspirin 325 MG tablet Take 325 mg by mouth daily.        . carvedilol (COREG) 25 MG tablet Take 1 tablet (25 mg total) by mouth 2 (two) times daily with a meal.  60 tablet  6  . cloNIDine (CATAPRES) 0.1 MG tablet Take one tablet by mouth every 2 hours until systolic blood pressure is less than 200 and diastolic is less than 100.  30 tablet  1  . ferrous gluconate (FERGON) 325 MG tablet Take 325 mg by mouth 2 (two) times daily.        . furosemide (LASIX) 40 MG tablet Take 1 tablet (40 mg total) by mouth 2 (two) times daily.  60 tablet  6  . hydrALAZINE (APRESOLINE) 50 MG tablet Take 1 tablet (50 mg total) by mouth 3 (three) times daily.  90 tablet  6  . isosorbide mononitrate (IMDUR) 60 MG 24 hr tablet Take 1 tablet (60 mg total) by mouth daily.  30 tablet  6  . pravastatin (PRAVACHOL) 40 MG tablet Take 1 tablet (40 mg total) by mouth every evening.  30 tablet  6  . spironolactone (ALDACTONE) 25 MG tablet Take 12.5 mg by mouth daily.           Past Medical History  Diagnosis Date  . History of non-ST elevation myocardial infarction (NSTEMI) 12/2006    In the setting of porfound epistaxis and anemia  . CAD (coronary artery disease)   . Abnormal echocardiogram 04/2008    EF 25%, apical, posterior akinesis, inferior akinesis, moderate LVH, mild MR, mild decreased RV function  . Hypertension   . Hyperlipidemia   . Chronic kidney disease     Average creatine of about 2.3  . Systolic CHF, chronic   . Ischemic cardiomyopathy   . Anemia     Past Surgical History  Procedure Date  . Cardiac catheterization 12/2006    Demonstrated 50% mid left circumflex lesion. RCA was severely diseased with 80% proximal disease in 3 seperate locations, 90% mid desiase with ulcerated plaque, RV marginal totally occluded and the mid RCA totally occluded with left to right collaterals to the distal RCA. Medical therapy was pursued at that time.    ROS:  As stated in the HPI and negative for all other systems.  PHYSICAL EXAM BP 128/80  Pulse 63  Resp 16  Ht 5\' 10"  (1.778 m)  Wt 212 lb (96.163 kg)  BMI 30.42 kg/m2 GENERAL:  Well appearing HEENT:  Pupils equal round and reactive, fundi not visualized, oral  mucosa unremarkable NECK:  No jugular venous distention, waveform within normal limits, carotid upstroke brisk and symmetric, no bruits, no thyromegaly LYMPHATICS:  No cervical, inguinal adenopathy LUNGS:  Clear to auscultation bilaterally BACK:  No CVA tenderness CHEST:  Unremarkable HEART:  PMI not displaced or sustained,S1 and S2 within normal limits, no S3, no S4, no clicks, no rubs, no murmurs ABD:  Flat, positive bowel sounds normal in frequency in pitch, no bruits, no rebound, no guarding, no midline pulsatile mass, no hepatomegaly, no splenomegaly EXT:  2 plus pulses throughout, no edema, no cyanosis no clubbing SKIN:  No rashes no nodules NEURO:  Cranial nerves II through XII grossly intact, motor grossly intact  throughout PSYCH:  Cognitively intact, oriented to person place and time  ASSESSMENT AND PLAN

## 2011-05-02 NOTE — Assessment & Plan Note (Signed)
His EF is much improved. We'll continue with meds as listed.

## 2011-05-02 NOTE — Assessment & Plan Note (Signed)
Chest pain is atypical and his anatomy was unchanged. We will continue with risk reduction.

## 2011-05-02 NOTE — Assessment & Plan Note (Signed)
The blood pressure is at target. No change in medications is indicated. We will continue with therapeutic lifestyle changes (TLC).  His BP goes up and I wonder whether this is related to compliance.  His BP is controlled today.

## 2011-05-02 NOTE — Patient Instructions (Addendum)
Follow up in 6 months with Dr Hochrein.  You will receive a letter in the mail 2 months before you are due.  Please call us when you receive this letter to schedule your follow up appointment.   The current medical regimen is effective;  continue present plan and medications.  

## 2011-05-03 ENCOUNTER — Telehealth: Payer: Self-pay | Admitting: Cardiology

## 2011-05-03 NOTE — Telephone Encounter (Signed)
Pt needs   Carvedilol  furosemindie Hydralazine' Pravastatin isosorbidne Amlodipine  To be [facility]

## 2011-05-04 MED ORDER — ISOSORBIDE MONONITRATE ER 60 MG PO TB24: 60.0000 mg | ORAL_TABLET | Freq: Every day | ORAL | Status: DC

## 2011-05-04 MED ORDER — CARVEDILOL 25 MG PO TABS: 25.0000 mg | ORAL_TABLET | Freq: Two times a day (BID) | ORAL | Status: DC

## 2011-05-04 MED ORDER — HYDRALAZINE HCL 50 MG PO TABS: 50.0000 mg | ORAL_TABLET | Freq: Three times a day (TID) | ORAL | Status: DC

## 2011-05-04 MED ORDER — AMLODIPINE BESYLATE 10 MG PO TABS: 10.0000 mg | ORAL_TABLET | Freq: Every day | ORAL | Status: DC

## 2011-05-04 MED ORDER — SPIRONOLACTONE 25 MG PO TABS: 12.5000 mg | ORAL_TABLET | Freq: Every day | ORAL | Status: DC

## 2011-05-04 MED ORDER — PRAVASTATIN SODIUM 40 MG PO TABS: 40.0000 mg | ORAL_TABLET | Freq: Every evening | ORAL | Status: DC

## 2011-05-09 ENCOUNTER — Other Ambulatory Visit: Payer: Self-pay | Admitting: Cardiology

## 2011-05-25 LAB — BASIC METABOLIC PANEL
BUN: 27 — ABNORMAL HIGH
CO2: 24
Calcium: 8.7
Chloride: 104
Creatinine, Ser: 2.3 — ABNORMAL HIGH
GFR calc non Af Amer: 32 — ABNORMAL LOW
Potassium: 3.5
Sodium: 137

## 2011-05-25 LAB — DIFFERENTIAL
Basophils Absolute: 0
Basophils Relative: 0
Eosinophils Absolute: 0.5
Eosinophils Absolute: 0.6
Lymphs Abs: 1.3
Monocytes Relative: 6
Neutro Abs: 7.6
Neutrophils Relative %: 76
Neutrophils Relative %: 85 — ABNORMAL HIGH

## 2011-05-25 LAB — CBC
Hemoglobin: 13.1
MCHC: 34
MCHC: 34.3
MCV: 90.3
Platelets: 280
RDW: 14.8

## 2011-05-25 LAB — POCT CARDIAC MARKERS
CKMB, poc: 2.4
CKMB, poc: 3.7
Myoglobin, poc: 137
Operator id: 216221
Troponin i, poc: 0.09 — ABNORMAL HIGH

## 2011-05-25 LAB — TSH: TSH: 0.687

## 2011-05-25 LAB — COMPREHENSIVE METABOLIC PANEL
ALT: 16
Calcium: 8.5
Glucose, Bld: 101 — ABNORMAL HIGH
Sodium: 136
Total Protein: 6.1

## 2011-05-25 LAB — CARDIAC PANEL(CRET KIN+CKTOT+MB+TROPI)
CK, MB: 1.2
CK, MB: 1.7
Relative Index: INVALID
Total CK: 79
Total CK: 95
Troponin I: 0.15 — ABNORMAL HIGH

## 2011-05-25 LAB — PROTIME-INR
INR: 1.2
Prothrombin Time: 15.7 — ABNORMAL HIGH

## 2011-05-25 LAB — APTT: aPTT: 37

## 2011-05-25 LAB — RAPID URINE DRUG SCREEN, HOSP PERFORMED
Barbiturates: NOT DETECTED
Opiates: POSITIVE — AB

## 2011-05-25 LAB — LIPID PANEL: VLDL: 15

## 2011-05-25 LAB — PHOSPHORUS: Phosphorus: 4.5

## 2011-05-25 LAB — B-NATRIURETIC PEPTIDE (CONVERTED LAB): Pro B Natriuretic peptide (BNP): 2690 — ABNORMAL HIGH

## 2011-05-27 NOTE — H&P (Signed)
NAMENILTON, LAVE NO.:  [id number]  MEDICAL RECORD NO.:  [id number]  LOCATION:                                 FACILITY:  PHYSICIAN:  Henderson Cloud, MD     DATE OF BIRTH:  18-Feb-1966  DATE OF ADMISSION: DATE OF DISCHARGE:                             HISTORY & PHYSICAL   CHIEF COMPLAINT:  Chest pain.  HISTORY OF PRESENT ILLNESS:  The patient is a 45 year old black male with past medical history significant for a mixed ischemic/nonischemic cardiomyopathy with an ejection fraction of 25-35% on the left heart catheterization in April 2008 showing nonobstructive disease in the left system and chronic diffuse disease in the right coronary artery, malignant hypertension, hyperlipidemia, stage III renal insufficiency who is presenting from home with a several hour history of intense left- sided sharp chest discomfort.  The patient states that during the day today he developed intense sharp left-sided discomfort that was worsened with deep inspiration.  There was no radiation of the pain or associated symptoms.  He reports similar type pain several years ago.  Upon initial evaluation, the patient's blood pressure was 200/114.  This blood pressure was brought down with the nitroglycerin drip and the pain improved, although it is still present.  Other than the above complaint, the patient had a recent history of dizziness.  He saw Dr. Antoine Poche in clinic approximately 1 week ago and declined admission for the workup of dizziness at that time that.  The patient states that he has been compliant with all of his medications and he has them with him today.  PAST MEDICAL HISTORY:  As above in HPI.  SOCIAL HISTORY:  History of tobacco but no longer uses.  He denies any current drug use.  FAMILY HISTORY:  Positive for premature coronary disease.  ALLERGIES:  No known drug allergies.  MEDICATIONS: 1. Aspirin 81 mg daily. 2. Carvedilol 25 mg b.i.d. 3. Norvasc 10 mg  daily. 4. Lasix 40 mg b.i.d. 5. Hydralazine 50 mg t.i.d. 6. Imdur 60 mg daily. 7. Pravastatin 40 mg daily.  REVIEW OF SYSTEMS:  As in HPI.  All other systems were reviewed and are negative.  PHYSICAL EXAMINATION:  VITAL SIGNS:  Temperature is 98.6, blood pressure 152/94, heart rate is ranging from 44-55, respiratory rate is 12, satting 100% on 2 liters. GENERAL:  In no acute distress. HEENT: Normocephalic, atraumatic. NECK:  Supple.  There is no JVD. HEART:  Regular rate and rhythm with a 2/6 holosystolic murmur along the left sternal border. LUNGS:  Bibasilar crackles. ABDOMEN:  Soft, nontender, nondistended. EXTREMITIES:  Trace bilateral lower extremity edema. SKIN:  Warm and dry. PSYCHIATRIC:  The patient is appropriate. NEUROLOGIC:  Nonfocal. MUSCULOSKELETAL:  Bilateral 5/5 upper and lower extremity strength.  LABORATORY DATA:  Sodium 140, potassium 3.8, chloride 103, CO2 is 27, BUN 20, creatinine 2.6, glucose 81.  White count 8, hemoglobin 13, hematocrit 39, platelet count 215, troponin less than 0.3.  BNP is 1786. CK 152, CK-MB 2.0.  Chest x-ray showed no acute abnormalities. EKG:  Sinus bradycardia with poor R-wave progression.  ASSESSMENT: 1. Atypical chest pain.  It seems to be pleuritic in nature.  Costochondritis and/or pericarditis should be considered.  Aortic     dissection is possible considering his hypertension, however, it is     less likely. 2. Malignant hypertension.  The patient states he is compliant with     his medications.  Secondary causes for malignant hypertension     should be entertained.  The patient had an MRA of the abdomen in     August 2011, however, the left renal artery was not sufficiently     visualized.  PLAN:  The patient will be admitted and ruled out for myocardial infarction although his initial cardiac markers are completely within normal limits.  We will check an echocardiogram to rule out pericardial effusion.  A TEE  could also be considered to evaluate the patient's aorta.  I will not get a CT scan at this time secondary to low suspicion of aortic dissection and the patient's renal insufficiency.  We will check a sed rate and CRP to evaluate for pericarditis.  Renal Dopplers will be checked.  Consider checking renin and aldosterone levels to rule out Conn syndrome.  His home medications will be continued with the exception of increasing his Aldactone to 25 mg daily.  He may need to have his hydralazine titrated up as well.     Henderson Cloud, MD     SGA/MEDQ  D:  03/20/2011  T:  03/20/2011  Job:  [id number]  Electronically Signed by Raynelle Bring MD on 05/27/2011 09:53:07 AM

## 2011-05-31 LAB — CBC
HCT: 34.2 — ABNORMAL LOW
HCT: 37.8 — ABNORMAL LOW
HCT: 34.3 — ABNORMAL LOW
Hemoglobin: 13.1
Hemoglobin: 11.6 — ABNORMAL LOW
MCHC: 33.6
MCV: 87.9
MCV: 88.4
MCV: 88.3
Platelets: 237
Platelets: 168
RBC: 3.89 — ABNORMAL LOW
RBC: 3.98 — ABNORMAL LOW
RBC: 4.28
RBC: 3.89 — ABNORMAL LOW
RDW: 14.1
WBC: 6.9
WBC: 7.3
WBC: 9
WBC: 10.4

## 2011-05-31 LAB — DIFFERENTIAL
Basophils Absolute: 0
Basophils Relative: 0
Eosinophils Absolute: 0.3
Eosinophils Absolute: 0.5
Eosinophils Absolute: 0
Eosinophils Relative: 0
Lymphocytes Relative: 20
Lymphocytes Relative: 24
Lymphocytes Relative: 5 — ABNORMAL LOW
Lymphs Abs: 1.7
Lymphs Abs: 1.7
Lymphs Abs: 1.8
Lymphs Abs: 0.6 — ABNORMAL LOW
Monocytes Absolute: 0.4
Monocytes Absolute: 0.3
Monocytes Relative: 3
Monocytes Relative: 5
Monocytes Relative: 6
Monocytes Relative: 3
Neutro Abs: 4.6
Neutro Abs: 9.5 — ABNORMAL HIGH
Neutrophils Relative %: 61
Neutrophils Relative %: 64
Neutrophils Relative %: 72
Neutrophils Relative %: 91 — ABNORMAL HIGH

## 2011-05-31 LAB — POCT CARDIAC MARKERS
CKMB, poc: 1.9
Myoglobin, poc: 44.7
Operator id: 106841
Troponin i, poc: <0.05
Troponin i, poc: <0.05

## 2011-05-31 LAB — URINALYSIS, ROUTINE W REFLEX MICROSCOPIC
Bilirubin Urine: NEGATIVE
Leukocytes, UA: NEGATIVE
Nitrite: NEGATIVE
Specific Gravity, Urine: 1.025
Urobilinogen, UA: 0.2
pH: 6

## 2011-05-31 LAB — BASIC METABOLIC PANEL
BUN: 29 — ABNORMAL HIGH
CO2: 21
Calcium: 8.2 — ABNORMAL LOW
Chloride: 101
Chloride: 109
Creatinine, Ser: 2.3 — ABNORMAL HIGH
Creatinine, Ser: 2.34 — ABNORMAL HIGH
Creatinine, Ser: 2.42 — ABNORMAL HIGH
GFR calc Af Amer: 36 — ABNORMAL LOW
GFR calc Af Amer: 38 — ABNORMAL LOW
GFR calc Af Amer: 38 — ABNORMAL LOW
GFR calc non Af Amer: 31 — ABNORMAL LOW
GFR calc non Af Amer: 32 — ABNORMAL LOW
Glucose, Bld: 128 — ABNORMAL HIGH
Potassium: 3.6
Potassium: 3.8
Potassium: 4
Sodium: 138
Sodium: 136

## 2011-05-31 LAB — BASIC METABOLIC PANEL WITH GFR
BUN: 24 — ABNORMAL HIGH
Chloride: 104
Creatinine, Ser: 2.75 — ABNORMAL HIGH
GFR calc Af Amer: 31 — ABNORMAL LOW
GFR calc non Af Amer: 26 — ABNORMAL LOW

## 2011-05-31 LAB — CARDIAC PANEL(CRET KIN+CKTOT+MB+TROPI)
CK, MB: 2.3
Relative Index: 1.8
Relative Index: 1.9
Total CK: 199
Troponin I: 0.09 — ABNORMAL HIGH
Troponin I: 0.13 — ABNORMAL HIGH

## 2011-05-31 LAB — URINE MICROSCOPIC-ADD ON

## 2011-05-31 LAB — RAPID URINE DRUG SCREEN, HOSP PERFORMED
Benzodiazepines: NOT DETECTED
Cocaine: NOT DETECTED
Tetrahydrocannabinol: POSITIVE — AB

## 2011-05-31 LAB — B-NATRIURETIC PEPTIDE (CONVERTED LAB)
Pro B Natriuretic peptide (BNP): 1380 — ABNORMAL HIGH
Pro B Natriuretic peptide (BNP): 2890 — ABNORMAL HIGH
Pro B Natriuretic peptide (BNP): >3200 — ABNORMAL HIGH

## 2011-05-31 LAB — APTT: aPTT: 35

## 2011-05-31 LAB — PROTIME-INR: INR: 1.4

## 2011-06-01 LAB — HEPATIC FUNCTION PANEL
ALT: 11
AST: 21
Albumin: 3.1 — ABNORMAL LOW
Alkaline Phosphatase: 50
Total Protein: 6.2

## 2011-06-01 LAB — CBC
HCT: 26.2 — ABNORMAL LOW
HCT: 26.4 — ABNORMAL LOW
Hemoglobin: 10.2 — ABNORMAL LOW
Hemoglobin: 8.6 — ABNORMAL LOW
Hemoglobin: 8.7 — ABNORMAL LOW
MCHC: 32.9
MCHC: 33.2
MCHC: 33.3
MCV: 87.8
MCV: 88.3
MCV: 88.6
MCV: 88.8
Platelets: 160
Platelets: 236
RBC: 2.95 — ABNORMAL LOW
RDW: 14.1
RDW: 14.2
WBC: 5.7
WBC: 5.7
WBC: 8.5

## 2011-06-01 LAB — COMPREHENSIVE METABOLIC PANEL
CO2: 24
Calcium: 8.3 — ABNORMAL LOW
Creatinine, Ser: 2.23 — ABNORMAL HIGH
GFR calc non Af Amer: 33 — ABNORMAL LOW
Glucose, Bld: 96

## 2011-06-01 LAB — BASIC METABOLIC PANEL
BUN: 29 — ABNORMAL HIGH
CO2: 28
Calcium: 8.1 — ABNORMAL LOW
Calcium: 8.2 — ABNORMAL LOW
Chloride: 102
Creatinine, Ser: 2.29 — ABNORMAL HIGH
Creatinine, Ser: 2.35 — ABNORMAL HIGH
GFR calc Af Amer: 44 — ABNORMAL LOW
GFR calc non Af Amer: 36 — ABNORMAL LOW
Glucose, Bld: 111 — ABNORMAL HIGH
Glucose, Bld: 98
Potassium: 3.6
Sodium: 138
Sodium: 139

## 2011-06-01 LAB — URINALYSIS, ROUTINE W REFLEX MICROSCOPIC
Hgb urine dipstick: NEGATIVE
Protein, ur: NEGATIVE
Urobilinogen, UA: 1

## 2011-06-01 LAB — POCT I-STAT, CHEM 8
BUN: 50 — ABNORMAL HIGH
Calcium, Ion: 1.09 — ABNORMAL LOW
Creatinine, Ser: 2.1 — ABNORMAL HIGH
TCO2: 26

## 2011-06-01 LAB — CROSSMATCH: Antibody Screen: NEGATIVE

## 2011-06-01 LAB — CARDIAC PANEL(CRET KIN+CKTOT+MB+TROPI)
CK, MB: 0.9
CK, MB: 1
Relative Index: INVALID
Relative Index: INVALID
Total CK: 49
Total CK: 52
Troponin I: 0.03

## 2011-06-01 LAB — DIFFERENTIAL
Basophils Absolute: 0.1
Eosinophils Absolute: 0
Lymphocytes Relative: 16
Lymphs Abs: 1.4
Neutrophils Relative %: 78 — ABNORMAL HIGH

## 2011-06-01 LAB — SAMPLE TO BLOOD BANK

## 2011-06-01 LAB — HEMOGLOBIN AND HEMATOCRIT, BLOOD
HCT: 28.3 — ABNORMAL LOW
HCT: 31 — ABNORMAL LOW
Hemoglobin: 10.3 — ABNORMAL LOW
Hemoglobin: 8.5 — ABNORMAL LOW

## 2011-06-01 LAB — TRICYCLICS SCREEN, URINE: TCA Scrn: NOT DETECTED

## 2011-06-01 LAB — RAPID URINE DRUG SCREEN, HOSP PERFORMED
Amphetamines: NOT DETECTED
Benzodiazepines: NOT DETECTED

## 2011-06-01 LAB — APTT: aPTT: 35

## 2011-06-01 LAB — LIPASE, BLOOD: Lipase: 14

## 2011-06-04 LAB — DIFFERENTIAL
Basophils Absolute: 0
Basophils Absolute: 0
Basophils Absolute: 0
Eosinophils Relative: 3
Eosinophils Relative: 7 — ABNORMAL HIGH
Lymphocytes Relative: 14
Lymphocytes Relative: 16
Lymphocytes Relative: 21
Lymphs Abs: 1.3
Monocytes Absolute: 0.5
Monocytes Absolute: 0.6
Monocytes Relative: 7
Neutro Abs: 5.9
Neutrophils Relative %: 81 — ABNORMAL HIGH

## 2011-06-04 LAB — CARDIAC PANEL(CRET KIN+CKTOT+MB+TROPI)
CK, MB: 2.1
Relative Index: INVALID
Relative Index: INVALID
Total CK: 81
Troponin I: 0.08 — ABNORMAL HIGH
Troponin I: 0.12 — ABNORMAL HIGH

## 2011-06-04 LAB — BASIC METABOLIC PANEL
BUN: 21
BUN: 27 — ABNORMAL HIGH
BUN: 29 — ABNORMAL HIGH
CO2: 27
CO2: 28
CO2: 28
CO2: 30
Calcium: 8.1 — ABNORMAL LOW
Calcium: 8.5
Calcium: 7.9 — ABNORMAL LOW
Chloride: 102
Chloride: 104
Chloride: 99
Creatinine, Ser: 2.09 — ABNORMAL HIGH
Creatinine, Ser: 2.45 — ABNORMAL HIGH
Creatinine, Ser: 2.5 — ABNORMAL HIGH
GFR calc Af Amer: 43 — ABNORMAL LOW
GFR calc Af Amer: 31 — ABNORMAL LOW
GFR calc non Af Amer: 26 — ABNORMAL LOW
GFR calc non Af Amer: 28 — ABNORMAL LOW
Glucose, Bld: 165 — ABNORMAL HIGH
Glucose, Bld: 102 — ABNORMAL HIGH
Glucose, Bld: 89
Potassium: 3.2 — ABNORMAL LOW
Sodium: 139
Sodium: 139

## 2011-06-04 LAB — CK TOTAL AND CKMB (NOT AT ARMC): Total CK: 119

## 2011-06-04 LAB — LIPASE, BLOOD: Lipase: 17

## 2011-06-04 LAB — CBC
HCT: 33.3 — ABNORMAL LOW
HCT: 34.7 — ABNORMAL LOW
Hemoglobin: 10.5 — ABNORMAL LOW
Hemoglobin: 11.4 — ABNORMAL LOW
MCHC: 32.1
MCHC: 33
MCV: 77.8 — ABNORMAL LOW
MCV: 76.2 — ABNORMAL LOW
Platelets: 286
Platelets: 252
RBC: 4.19 — ABNORMAL LOW
RBC: 4.21 — ABNORMAL LOW
RDW: 18.3 — ABNORMAL HIGH
RDW: 18.5 — ABNORMAL HIGH
WBC: 8.3

## 2011-06-04 LAB — COMPREHENSIVE METABOLIC PANEL
AST: 32
BUN: 31 — ABNORMAL HIGH
CO2: 23
Chloride: 106
Creatinine, Ser: 2.71 — ABNORMAL HIGH
GFR calc Af Amer: 32 — ABNORMAL LOW
GFR calc non Af Amer: 26 — ABNORMAL LOW
Glucose, Bld: 101 — ABNORMAL HIGH
Total Bilirubin: 1.5 — ABNORMAL HIGH

## 2011-06-04 LAB — RAPID URINE DRUG SCREEN, HOSP PERFORMED: Barbiturates: NOT DETECTED

## 2011-06-04 LAB — GLUCOSE, CAPILLARY: Glucose-Capillary: 125 — ABNORMAL HIGH

## 2011-06-04 LAB — TROPONIN I: Troponin I: 0.13 — ABNORMAL HIGH

## 2011-06-05 LAB — CBC
HCT: 35.7 — ABNORMAL LOW
HCT: 37.5 — ABNORMAL LOW
Hemoglobin: 10.3 — ABNORMAL LOW
Hemoglobin: 12.1 — ABNORMAL LOW
MCHC: 32.3
MCV: 74.5 — ABNORMAL LOW
MCV: 75.3 — ABNORMAL LOW
MCV: 75.7 — ABNORMAL LOW
MCV: 75.8 — ABNORMAL LOW
Platelets: 280
Platelets: 302
RBC: 4.26
RBC: 4.35
RBC: 4.96
RDW: 19.9 — ABNORMAL HIGH
RDW: 20.1 — ABNORMAL HIGH
RDW: 20.3 — ABNORMAL HIGH
RDW: 20.4 — ABNORMAL HIGH
WBC: 7.2
WBC: 7.5
WBC: 8

## 2011-06-05 LAB — DIFFERENTIAL
Basophils Absolute: 0
Basophils Absolute: 0.1
Basophils Absolute: 0.1
Basophils Relative: 0
Basophils Relative: 1
Eosinophils Absolute: 0.1
Eosinophils Absolute: 0.2
Eosinophils Absolute: 0.2
Eosinophils Absolute: 0.4
Eosinophils Absolute: 0.5
Eosinophils Relative: 2
Eosinophils Relative: 5
Lymphs Abs: 1.4
Lymphs Abs: 1.6
Monocytes Relative: 3
Monocytes Relative: 4
Neutro Abs: 7.5
Neutrophils Relative %: 68
Neutrophils Relative %: 75
Neutrophils Relative %: 77
Neutrophils Relative %: 79 — ABNORMAL HIGH

## 2011-06-05 LAB — POCT CARDIAC MARKERS
CKMB, poc: 1.9
CKMB, poc: 2.8
Myoglobin, poc: 126
Troponin i, poc: <0.05
Troponin i, poc: <0.05

## 2011-06-05 LAB — COMPREHENSIVE METABOLIC PANEL
ALT: 14
ALT: 16
ALT: 16
AST: 16
Alkaline Phosphatase: 49
Alkaline Phosphatase: 51
BUN: 31 — ABNORMAL HIGH
CO2: 23
CO2: 25
CO2: 26
Calcium: 9.2
Chloride: 105
Chloride: 110
Creatinine, Ser: 2.34 — ABNORMAL HIGH
GFR calc Af Amer: 38 — ABNORMAL LOW
GFR calc non Af Amer: 31 — ABNORMAL LOW
GFR calc non Af Amer: 31 — ABNORMAL LOW
Glucose, Bld: 118 — ABNORMAL HIGH
Glucose, Bld: 134 — ABNORMAL HIGH
Potassium: 3.6
Sodium: 137
Sodium: 138
Sodium: 139
Total Bilirubin: 1
Total Bilirubin: 1.1
Total Protein: 5.1 — ABNORMAL LOW
Total Protein: 6.6

## 2011-06-05 LAB — CARDIAC PANEL(CRET KIN+CKTOT+MB+TROPI)
CK, MB: 4.6 — ABNORMAL HIGH
Relative Index: 3.4 — ABNORMAL HIGH
Relative Index: INVALID
Relative Index: INVALID
Total CK: 60

## 2011-06-05 LAB — BASIC METABOLIC PANEL
BUN: 23
BUN: 28 — ABNORMAL HIGH
Chloride: 104
Chloride: 109
Creatinine, Ser: 1.92 — ABNORMAL HIGH
Creatinine, Ser: 2.24 — ABNORMAL HIGH
GFR calc non Af Amer: 32 — ABNORMAL LOW
GFR calc non Af Amer: 39 — ABNORMAL LOW
Glucose, Bld: 117 — ABNORMAL HIGH
Potassium: 3.8

## 2011-06-05 LAB — MAGNESIUM: Magnesium: 1.8

## 2011-06-05 LAB — B-NATRIURETIC PEPTIDE (CONVERTED LAB): Pro B Natriuretic peptide (BNP): >3200 — ABNORMAL HIGH

## 2011-06-11 ENCOUNTER — Telehealth: Payer: Self-pay | Admitting: Cardiology

## 2011-06-11 NOTE — Telephone Encounter (Signed)
[facility] OD FAX RE A  TELE MONITORING SYSTEM

## 2011-06-11 NOTE — Telephone Encounter (Signed)
Left message to call back.  Unsure of what she is wanting.  The only paperwork I have seen on this pt was from [facility] and questioning the type of care he needs and if he is home bound.  That paperwork was completed and faxed back last week.  If there is different paperwork needed she will need to re-fax it.

## 2011-06-12 NOTE — Telephone Encounter (Signed)
Pt returning your call

## 2011-06-13 NOTE — Telephone Encounter (Signed)
Caseworker has returned your call/ work n [phone] ext [phone].  She leaves at 5:00

## 2011-06-14 NOTE — Telephone Encounter (Signed)
Paperwork is ready and will be mailed today

## 2011-06-14 NOTE — Telephone Encounter (Signed)
If paper work from caseworker was received on chronic disease referral form, when can she pick this up. Pl call her at [phone] ext [phone]

## 2011-08-13 ENCOUNTER — Emergency Department (HOSPITAL_COMMUNITY): Payer: Medicaid Other

## 2011-08-13 ENCOUNTER — Other Ambulatory Visit: Payer: Self-pay

## 2011-08-13 ENCOUNTER — Emergency Department (HOSPITAL_COMMUNITY)
Admission: EM | Admit: 2011-08-13 | Discharge: 2011-08-13 | Disposition: A | Discharge status: Home / Self Care | Payer: Medicaid Other | Attending: Emergency Medicine | Admitting: Emergency Medicine

## 2011-08-13 ENCOUNTER — Encounter (HOSPITAL_COMMUNITY): Payer: Self-pay | Admitting: Emergency Medicine

## 2011-08-13 DIAGNOSIS — I509 Heart failure, unspecified: Secondary | ICD-10-CM | POA: Insufficient documentation

## 2011-08-13 DIAGNOSIS — R001 Bradycardia, unspecified: Secondary | ICD-10-CM

## 2011-08-13 DIAGNOSIS — R0789 Other chest pain: Secondary | ICD-10-CM

## 2011-08-13 DIAGNOSIS — I2510 Atherosclerotic heart disease of native coronary artery without angina pectoris: Secondary | ICD-10-CM | POA: Insufficient documentation

## 2011-08-13 DIAGNOSIS — E785 Hyperlipidemia, unspecified: Secondary | ICD-10-CM | POA: Insufficient documentation

## 2011-08-13 DIAGNOSIS — R059 Cough, unspecified: Secondary | ICD-10-CM | POA: Insufficient documentation

## 2011-08-13 DIAGNOSIS — R05 Cough: Secondary | ICD-10-CM

## 2011-08-13 DIAGNOSIS — Z7982 Long term (current) use of aspirin: Secondary | ICD-10-CM | POA: Insufficient documentation

## 2011-08-13 DIAGNOSIS — I129 Hypertensive chronic kidney disease with stage 1 through stage 4 chronic kidney disease, or unspecified chronic kidney disease: Secondary | ICD-10-CM | POA: Insufficient documentation

## 2011-08-13 DIAGNOSIS — R071 Chest pain on breathing: Secondary | ICD-10-CM | POA: Insufficient documentation

## 2011-08-13 DIAGNOSIS — I5022 Chronic systolic (congestive) heart failure: Secondary | ICD-10-CM | POA: Insufficient documentation

## 2011-08-13 DIAGNOSIS — Z79899 Other long term (current) drug therapy: Secondary | ICD-10-CM | POA: Insufficient documentation

## 2011-08-13 DIAGNOSIS — N189 Chronic kidney disease, unspecified: Secondary | ICD-10-CM | POA: Insufficient documentation

## 2011-08-13 DIAGNOSIS — J40 Bronchitis, not specified as acute or chronic: Secondary | ICD-10-CM

## 2011-08-13 DIAGNOSIS — R0989 Other specified symptoms and signs involving the circulatory and respiratory systems: Secondary | ICD-10-CM | POA: Insufficient documentation

## 2011-08-13 DIAGNOSIS — I498 Other specified cardiac arrhythmias: Secondary | ICD-10-CM | POA: Insufficient documentation

## 2011-08-13 DIAGNOSIS — I252 Old myocardial infarction: Secondary | ICD-10-CM | POA: Insufficient documentation

## 2011-08-13 LAB — CBC
HCT: 38.7 % — ABNORMAL LOW (ref 39.0–52.0)
Hemoglobin: 12.7 g/dL — ABNORMAL LOW (ref 13.0–17.0)
MCH: 29.3 pg (ref 26.0–34.0)
MCHC: 32.8 g/dL (ref 30.0–36.0)
MCV: 89.2 fL (ref 78.0–100.0)

## 2011-08-13 LAB — BASIC METABOLIC PANEL
BUN: 23 mg/dL (ref 6–23)
CO2: 28 mEq/L (ref 19–32)
Chloride: 104 mEq/L (ref 96–112)
Creatinine, Ser: 2.1 mg/dL — ABNORMAL HIGH (ref 0.50–1.35)
GFR calc Af Amer: 42 mL/min — ABNORMAL LOW (ref >90)
Glucose, Bld: 87 mg/dL (ref 70–99)
Potassium: 3.6 mEq/L (ref 3.5–5.1)

## 2011-08-13 LAB — POCT I-STAT TROPONIN I

## 2011-08-13 MED ORDER — HYDROCODONE-ACETAMINOPHEN 5-325 MG PO TABS
2.0000 | ORAL_TABLET | Freq: Once | ORAL | Status: AC
Start: 2011-08-13 — End: 2011-08-13
  Administered 2011-08-13: 2 via ORAL
  Filled 2011-08-13: qty 2

## 2011-08-13 MED ORDER — CARVEDILOL 12.5 MG PO TABS: 12.5000 mg | ORAL_TABLET | Freq: Two times a day (BID) | ORAL | Status: DC

## 2011-08-13 MED ORDER — AZITHROMYCIN 250 MG PO TABS: ORAL_TABLET | ORAL | Status: AC

## 2011-08-13 NOTE — ED Provider Notes (Addendum)
History     CSN: [id number] Arrival date & time: 08/13/2011  2:31 PM   First MD Initiated Contact with Patient 08/13/11 1433      Chief Complaint  Patient presents with  . Chest Pain    chest pain since yesterday worse with cough and deep breath, states green production with cough    (Consider location/radiation/quality/duration/timing/severity/associated sxs/prior treatment) Patient is a 45 y.o. male presenting with chest pain. The history is provided by the patient.  Chest Pain Primary symptoms include cough. Pertinent negatives for primary symptoms include no fever, no shortness of breath, no palpitations and no abdominal pain.   pt w hx cad presents w sharp left cp for past 1-2 days. Constant, worse w cough and palpation. Notes productive cough in the past few days. No sore throat, runny nose or other uri c/o. No fever or chills. Feels different from cardiac cp prior to past cardiac cath. Not pleuritic. No leg pain or swelling. No dvt or pe hx. No exertional cp or discomfort. No assoc nv, sob or diaphoresis. No radiation. No back, neck or jaw pain.   Past Medical History  Diagnosis Date  . History of non-ST elevation myocardial infarction (NSTEMI) 12/2006    In the setting of porfound epistaxis and anemia  . CAD (coronary artery disease)   . Abnormal echocardiogram 04/2008    EF 25%, apical, posterior akinesis, inferior akinesis, moderate LVH, mild MR, mild decreased RV function,  65% EF 2012 echo.  . Hypertension   . Hyperlipidemia   . Chronic kidney disease     Average creatine of about 2.3  . Systolic CHF, chronic   . Ischemic cardiomyopathy   . Anemia     Past Surgical History  Procedure Date  . Cardiac catheterization 12/2006    Demonstrated 50% mid left circumflex lesion. RCA was severely diseased with 80% proximal disease in 3 seperate locations, 90% mid desiase with ulcerated plaque, RV marginal totally occluded and the mid RCA totally occluded with left to right  collaterals to the distal RCA. Medical therapy was pursued at that time.    Family History  Problem Relation Age of Onset  . Hypertension Mother   . Diabetes Mother     History  Substance Use Topics  . Smoking status: Former Games developer  . Smokeless tobacco: Not on file  . Alcohol Use: No      Review of Systems  Constitutional: Negative for fever.  HENT: Negative for neck pain.   Eyes: Negative for redness.  Respiratory: Positive for cough. Negative for shortness of breath.   Cardiovascular: Positive for chest pain. Negative for palpitations and leg swelling.  Gastrointestinal: Negative for abdominal pain.  Genitourinary: Negative for flank pain.  Musculoskeletal: Negative for back pain.  Skin: Negative for rash.  Neurological: Negative for headaches.  Hematological: Does not bruise/bleed easily.  Psychiatric/Behavioral: Negative for confusion.    Allergies  Review of patient's allergies indicates no known allergies.  Home Medications   Current Outpatient Rx  Name Route Sig Dispense Refill  . AMLODIPINE BESYLATE 10 MG PO TABS Oral Take 10 mg by mouth daily.      . ASPIRIN 325 MG PO TABS Oral Take 325 mg by mouth daily.      Marland Kitchen CARVEDILOL 25 MG PO TABS Oral Take 25 mg by mouth 2 (two) times daily with a meal.      . FUROSEMIDE 40 MG PO TABS Oral Take 40 mg by mouth 2 (two) times daily.      Marland Kitchen  HYDRALAZINE HCL 50 MG PO TABS Oral Take 50 mg by mouth 3 (three) times daily.      . ISOSORBIDE MONONITRATE ER 60 MG PO TB24 Oral Take 60 mg by mouth daily.      Marland Kitchen NITROGLYCERIN 0.4 MG SL SUBL Sublingual Place 0.4 mg under the tongue every 5 (five) minutes x 3 doses as needed.      Marland Kitchen PRAVASTATIN SODIUM 40 MG PO TABS Oral Take 40 mg by mouth every evening.      Marland Kitchen SPIRONOLACTONE 25 MG PO TABS Oral Take 12.5 mg by mouth daily.        BP 142/90  Pulse 40  Temp(Src) 98.8 F (37.1 C) (Oral)  Resp 14  Ht 5\' 10"  (1.778 m)  Wt 225 lb (102.059 kg)  BMI 32.28 kg/m2  SpO2 100%  Physical  Exam  Nursing note and vitals reviewed. Constitutional: He is oriented to person, place, and time. He appears well-developed and well-nourished. No distress.  HENT:  Head: Atraumatic.  Eyes: Pupils are equal, round, and reactive to light.  Neck: Neck supple. No tracheal deviation present.  Cardiovascular: Normal rate, regular rhythm, normal heart sounds and intact distal pulses.  Exam reveals no gallop and no friction rub.   No murmur heard. Pulmonary/Chest: Effort normal. No accessory muscle usage. No respiratory distress. He exhibits tenderness.       Cough, upper resp congestion. Marked chest wall tenderness reproducing symptoms.   Abdominal: Soft. He exhibits no distension. There is no tenderness.  Musculoskeletal: Normal range of motion. He exhibits no edema and no tenderness.  Neurological: He is alert and oriented to person, place, and time.  Skin: Skin is warm and dry.  Psychiatric: He has a normal mood and affect.    ED Course  Procedures (including critical care time)   Results for orders placed during the hospital encounter of 08/13/11  BASIC METABOLIC PANEL      Component Value Range   Sodium 141  135 - 145 (mEq/L)   Potassium 3.6  3.5 - 5.1 (mEq/L)   Chloride 104  96 - 112 (mEq/L)   CO2 28  19 - 32 (mEq/L)   Glucose, Bld 87  70 - 99 (mg/dL)   BUN 23  6 - 23 (mg/dL)   Creatinine, Ser 1.61 (*) 0.50 - 1.35 (mg/dL)   Calcium 9.1  8.4 - 09.6 (mg/dL)   GFR calc non Af Amer 37 (*) >90 (mL/min)   GFR calc Af Amer 42 (*) >90 (mL/min)  CBC      Component Value Range   WBC 8.1  4.0 - 10.5 (K/uL)   RBC 4.34  4.22 - 5.81 (MIL/uL)   Hemoglobin 12.7 (*) 13.0 - 17.0 (g/dL)   HCT 04.5 (*) 40.9 - 52.0 (%)   MCV 89.2  78.0 - 100.0 (fL)   MCH 29.3  26.0 - 34.0 (pg)   MCHC 32.8  30.0 - 36.0 (g/dL)   RDW 81.1  91.4 - 78.2 (%)   Platelets 228  150 - 400 (K/uL)  POCT I-STAT TROPONIN I      Component Value Range   Troponin i, poc 0.06  0.00 - 0.08 (ng/mL)   Comment 3                 MDM  Ecg. Labs. vicodin for pain. Reviewed prior charts, cath report. Nursing notes reviewed.     Date: 08/13/2011  Rate: 41  Rhythm: sinus bradycardia  QRS Axis: normal  Intervals: normal  ST/T Wave abnormalities: normal  Conduction Disutrbances:none  Narrative Interpretation:   Old EKG Reviewed: changes noted  Renal fxn at baseline. After >24hrs symptoms, trop normal. Discussed pt with his on call cardiologist, dr Elease Hashimoto, incl atypical cp, cough, and hr in 40 range. Also discussed meds, incl coreg. He states as bp ok, no faintness/dizziness, etc - to d/c and have f/u in office, no need to stop coreg, says if pt becomes symptomatic w hr could cut dose in 1/2.   Recheck pt comfortable. Occasional coughing spell, non prod, reproducing symptoms. Chest wall tender.  No pna on cxr.   On requestioning, pt states occasionally lightheaded when stands.  Will dec coreg dose.    Suzi Roots, MD 08/13/11 1723  Suzi Roots, MD 08/14/11 [phone]

## 2011-08-13 NOTE — ED Notes (Signed)
Chest pain since yesterday worse with cough and deep breath, states coughing up thick green secretions.

## 2011-10-31 ENCOUNTER — Encounter: Payer: Self-pay | Admitting: Cardiology

## 2011-10-31 ENCOUNTER — Ambulatory Visit (INDEPENDENT_AMBULATORY_CARE_PROVIDER_SITE_OTHER): Payer: Medicaid Other | Admitting: Cardiology

## 2011-10-31 DIAGNOSIS — R079 Chest pain, unspecified: Secondary | ICD-10-CM

## 2011-10-31 DIAGNOSIS — I2510 Atherosclerotic heart disease of native coronary artery without angina pectoris: Secondary | ICD-10-CM

## 2011-10-31 DIAGNOSIS — I5022 Chronic systolic (congestive) heart failure: Secondary | ICD-10-CM

## 2011-10-31 DIAGNOSIS — N189 Chronic kidney disease, unspecified: Secondary | ICD-10-CM

## 2011-10-31 DIAGNOSIS — I10 Essential (primary) hypertension: Secondary | ICD-10-CM

## 2011-10-31 DIAGNOSIS — R55 Syncope and collapse: Secondary | ICD-10-CM

## 2011-10-31 MED ORDER — ISOSORBIDE MONONITRATE ER 120 MG PO TB24: 120.0000 mg | ORAL_TABLET | Freq: Every day | ORAL | Status: DC

## 2011-10-31 NOTE — Patient Instructions (Signed)
Please increase your Imdur to 120 mg once a day. Continue all other medications as listed.  Follow up in 6 months with Dr Antoine Poche.  You will receive a letter in the mail 2 months before you are due.  Please call us when you receive this letter to schedule your follow up appointment.

## 2011-10-31 NOTE — Assessment & Plan Note (Signed)
His last ejection fraction was preserved. He seems to be euvolemic.  He will continue with meds with the change as listed above.

## 2011-10-31 NOTE — Assessment & Plan Note (Signed)
Today I will increase his Imdur to 120 mg daily. No further cardiovascular testing is suggested. He will continue with risk reduction.

## 2011-10-31 NOTE — Assessment & Plan Note (Signed)
The etiology of this has not been clear. There does not appear to be a cardiac etiology. I will defer to his primary provider and suggest possible neurology workup this continues.

## 2011-10-31 NOTE — Progress Notes (Signed)
HPI The patient presents for followup of his coronary disease and difficult to control hypertension. He has continued to have chest discomfort. This is unchanged since his last catheterization at which point it was decided to treat his coronary disease medically. He says he still gets periodic chest pain and on some days takes up to 3 nitroglycerin. Again, he reports that this is not a pattern. It's not reproducible with activity. He's also had episodes of headaches with some lightheadedness. This has been evaluated in the past after syncope without clear etiology. He does do some walking for exercise if he feels up to it. He says he is not smoking cigarettes. He's compliant with his medications. He has some dyspnea with exertion but this is unchanged. He's not describing new PND or orthopnea.  No Known Allergies  Current Outpatient Prescriptions  Medication Sig Dispense Refill  . amLODipine (NORVASC) 10 MG tablet Take 10 mg by mouth daily.        Marland Kitchen aspirin 325 MG tablet Take 325 mg by mouth daily.        . carvedilol (COREG) 12.5 MG tablet Take 1 tablet (12.5 mg total) by mouth 2 (two) times daily with a meal.  60 tablet  0  . furosemide (LASIX) 40 MG tablet Take 40 mg by mouth 2 (two) times daily.        . hydrALAZINE (APRESOLINE) 50 MG tablet Take 50 mg by mouth 3 (three) times daily.        . isosorbide mononitrate (IMDUR) 60 MG 24 hr tablet Take 60 mg by mouth daily.        . nitroGLYCERIN (NITROSTAT) 0.4 MG SL tablet Place 0.4 mg under the tongue every 5 (five) minutes x 3 doses as needed.        . pravastatin (PRAVACHOL) 40 MG tablet Take 40 mg by mouth every evening.        Marland Kitchen spironolactone (ALDACTONE) 25 MG tablet Take 12.5 mg by mouth daily.          Past Medical History  Diagnosis Date  . History of non-ST elevation myocardial infarction (NSTEMI) 12/2006    In the setting of porfound epistaxis and anemia  . CAD (coronary artery disease)   . Abnormal echocardiogram 04/2008    EF  25%, apical, posterior akinesis, inferior akinesis, moderate LVH, mild MR, mild decreased RV function,  65% EF 2012 echo.  . Hypertension   . Hyperlipidemia   . Chronic kidney disease     Average creatine of about 2.3  . Systolic CHF, chronic   . Ischemic cardiomyopathy   . Anemia     Past Surgical History  Procedure Date  . Cardiac catheterization 12/2006    Demonstrated 50% mid left circumflex lesion. RCA was severely diseased with 80% proximal disease in 3 seperate locations, 90% mid desiase with ulcerated plaque, RV marginal totally occluded and the mid RCA totally occluded with left to right collaterals to the distal RCA. Medical therapy was pursued at that time.    ROS:  As stated in the HPI and negative for all other systems.  PHYSICAL EXAM BP 168/98  Pulse 64  Ht 5\' 10"  (1.778 m)  Wt 216 lb (97.977 kg)  BMI 30.99 kg/m2 GENERAL:  Well appearing HEENT:  Pupils equal round and reactive, fundi not visualized, oral mucosa unremarkable NECK:  No jugular venous distention, waveform within normal limits, carotid upstroke brisk and symmetric, no bruits, no thyromegaly LYMPHATICS:  No cervical, inguinal adenopathy LUNGS:  Clear to auscultation bilaterally BACK:  No CVA tenderness CHEST:  Unremarkable HEART:  PMI not displaced or sustained,S1 and S2 within normal limits, no S3, no S4, no clicks, no rubs, no murmurs ABD:  Flat, positive bowel sounds normal in frequency in pitch, no bruits, no rebound, no guarding, no midline pulsatile mass, no hepatomegaly, no splenomegaly EXT:  2 plus pulses throughout, no edema, no cyanosis no clubbing SKIN:  No rashes no nodules NEURO:  Cranial nerves II through XII grossly intact, motor grossly intact throughout PSYCH:  Cognitively intact, oriented to person place and time  EKG:  Sinus rhythm, rate 65, axis within normal limits, RSR prime V1 and V2, QTC prolonged, poor anterior R wave progression, no acute ST-T wave changes.   10/31/2011   ASSESSMENT AND PLAN

## 2011-10-31 NOTE — Assessment & Plan Note (Signed)
His blood pressure is actually better control than it has been previously. Meds will be adjusted as above.

## 2011-10-31 NOTE — Assessment & Plan Note (Signed)
I don't have a recent labs but he is followed by nephrology.

## 2011-11-16 ENCOUNTER — Emergency Department (HOSPITAL_COMMUNITY): Payer: Medicaid Other

## 2011-11-16 ENCOUNTER — Encounter (HOSPITAL_COMMUNITY): Payer: Self-pay | Admitting: *Deleted

## 2011-11-16 ENCOUNTER — Inpatient Hospital Stay (HOSPITAL_COMMUNITY)
Admission: EM | Admit: 2011-11-16 | Discharge: 2011-11-19 | DRG: 194 | Disposition: A | Discharge status: Home / Self Care | Payer: Medicaid Other | Attending: Family Medicine | Admitting: Family Medicine

## 2011-11-16 ENCOUNTER — Other Ambulatory Visit: Payer: Self-pay

## 2011-11-16 DIAGNOSIS — I2582 Chronic total occlusion of coronary artery: Secondary | ICD-10-CM | POA: Diagnosis present

## 2011-11-16 DIAGNOSIS — Z8249 Family history of ischemic heart disease and other diseases of the circulatory system: Secondary | ICD-10-CM

## 2011-11-16 DIAGNOSIS — I10 Essential (primary) hypertension: Secondary | ICD-10-CM

## 2011-11-16 DIAGNOSIS — N189 Chronic kidney disease, unspecified: Secondary | ICD-10-CM | POA: Diagnosis present

## 2011-11-16 DIAGNOSIS — Z87891 Personal history of nicotine dependence: Secondary | ICD-10-CM

## 2011-11-16 DIAGNOSIS — D649 Anemia, unspecified: Secondary | ICD-10-CM

## 2011-11-16 DIAGNOSIS — I5022 Chronic systolic (congestive) heart failure: Secondary | ICD-10-CM

## 2011-11-16 DIAGNOSIS — J180 Bronchopneumonia, unspecified organism: Principal | ICD-10-CM | POA: Diagnosis present

## 2011-11-16 DIAGNOSIS — I5042 Chronic combined systolic (congestive) and diastolic (congestive) heart failure: Secondary | ICD-10-CM | POA: Diagnosis present

## 2011-11-16 DIAGNOSIS — Z833 Family history of diabetes mellitus: Secondary | ICD-10-CM

## 2011-11-16 DIAGNOSIS — I2510 Atherosclerotic heart disease of native coronary artery without angina pectoris: Secondary | ICD-10-CM | POA: Diagnosis present

## 2011-11-16 DIAGNOSIS — N183 Chronic kidney disease, stage 3 unspecified: Secondary | ICD-10-CM | POA: Diagnosis present

## 2011-11-16 DIAGNOSIS — J189 Pneumonia, unspecified organism: Secondary | ICD-10-CM

## 2011-11-16 DIAGNOSIS — Z79899 Other long term (current) drug therapy: Secondary | ICD-10-CM

## 2011-11-16 DIAGNOSIS — I509 Heart failure, unspecified: Secondary | ICD-10-CM | POA: Diagnosis present

## 2011-11-16 DIAGNOSIS — I129 Hypertensive chronic kidney disease with stage 1 through stage 4 chronic kidney disease, or unspecified chronic kidney disease: Secondary | ICD-10-CM | POA: Diagnosis present

## 2011-11-16 DIAGNOSIS — I252 Old myocardial infarction: Secondary | ICD-10-CM

## 2011-11-16 DIAGNOSIS — E785 Hyperlipidemia, unspecified: Secondary | ICD-10-CM | POA: Diagnosis present

## 2011-11-16 DIAGNOSIS — I2789 Other specified pulmonary heart diseases: Secondary | ICD-10-CM | POA: Diagnosis present

## 2011-11-16 DIAGNOSIS — R55 Syncope and collapse: Secondary | ICD-10-CM

## 2011-11-16 DIAGNOSIS — R079 Chest pain, unspecified: Secondary | ICD-10-CM

## 2011-11-16 DIAGNOSIS — I2589 Other forms of chronic ischemic heart disease: Secondary | ICD-10-CM | POA: Diagnosis present

## 2011-11-16 DIAGNOSIS — Z7982 Long term (current) use of aspirin: Secondary | ICD-10-CM

## 2011-11-16 LAB — DIFFERENTIAL
Basophils Absolute: 0.1 K/uL (ref 0.0–0.1)
Basophils Relative: 1 % (ref 0–1)
Eosinophils Absolute: 0.4 K/uL (ref 0.0–0.7)
Eosinophils Relative: 6 % — ABNORMAL HIGH (ref 0–5)
Lymphocytes Relative: 24 % (ref 12–46)
Lymphs Abs: 1.4 K/uL (ref 0.7–4.0)
Monocytes Absolute: 0.6 10*3/uL (ref 0.1–1.0)
Monocytes Relative: 11 % (ref 3–12)
Neutro Abs: 3.3 10*3/uL (ref 1.7–7.7)
Neutrophils Relative %: 58 % (ref 43–77)

## 2011-11-16 LAB — CBC
HCT: 42.2 % (ref 39.0–52.0)
Hemoglobin: 14 g/dL (ref 13.0–17.0)
MCH: 29.5 pg (ref 26.0–34.0)
MCHC: 33.2 g/dL (ref 30.0–36.0)
MCV: 89 fL (ref 78.0–100.0)
Platelets: 193 K/uL (ref 150–400)
RBC: 4.74 MIL/uL (ref 4.22–5.81)
RDW: 14.7 % (ref 11.5–15.5)
WBC: 5.7 K/uL (ref 4.0–10.5)

## 2011-11-16 LAB — HEPARIN LEVEL (UNFRACTIONATED): Heparin Unfractionated: 0.4 IU/mL (ref 0.30–0.70)

## 2011-11-16 LAB — BASIC METABOLIC PANEL WITH GFR
Calcium: 8.8 mg/dL (ref 8.4–10.5)
GFR calc non Af Amer: 36 mL/min — ABNORMAL LOW (ref >90)
Glucose, Bld: 105 mg/dL — ABNORMAL HIGH (ref 70–99)
Potassium: 3.8 meq/L (ref 3.5–5.1)
Sodium: 142 meq/L (ref 135–145)

## 2011-11-16 LAB — TROPONIN I
Troponin I: <0.3 ng/mL (ref <0.30)
Troponin I: <0.3 ng/mL (ref <0.30)

## 2011-11-16 LAB — BASIC METABOLIC PANEL
BUN: 20 mg/dL (ref 6–23)
CO2: 27 mEq/L (ref 19–32)
Chloride: 104 mEq/L (ref 96–112)
Creatinine, Ser: 2.12 mg/dL — ABNORMAL HIGH (ref 0.50–1.35)
GFR calc Af Amer: 42 mL/min — ABNORMAL LOW (ref >90)

## 2011-11-16 MED ORDER — ASPIRIN 325 MG PO TABS
325.0000 mg | ORAL_TABLET | Freq: Every day | ORAL | Status: DC
  Administered 2011-11-16 – 2011-11-19 (×4): 325 mg via ORAL
  Filled 2011-11-16 (×5): qty 1

## 2011-11-16 MED ORDER — DEXTROSE 5 % IV SOLN
1.0000 g | Freq: Once | INTRAVENOUS | Status: AC
  Administered 2011-11-16: 1 g via INTRAVENOUS
  Filled 2011-11-16: qty 10

## 2011-11-16 MED ORDER — SENNOSIDES-DOCUSATE SODIUM 8.6-50 MG PO TABS
1.0000 | ORAL_TABLET | Freq: Every evening | ORAL | Status: DC | PRN
  Filled 2011-11-16: qty 1

## 2011-11-16 MED ORDER — ACETAMINOPHEN 325 MG PO TABS
650.0000 mg | ORAL_TABLET | Freq: Four times a day (QID) | ORAL | Status: DC | PRN
  Administered 2011-11-17 – 2011-11-18 (×2): 650 mg via ORAL
  Filled 2011-11-16 (×2): qty 2

## 2011-11-16 MED ORDER — ASPIRIN EC 325 MG PO TBEC: 325.0000 mg | DELAYED_RELEASE_TABLET | Freq: Every day | ORAL | Status: DC

## 2011-11-16 MED ORDER — ONDANSETRON HCL 4 MG/2ML IJ SOLN
4.0000 mg | Freq: Four times a day (QID) | INTRAMUSCULAR | Status: DC | PRN
  Administered 2011-11-18: 4 mg via INTRAVENOUS

## 2011-11-16 MED ORDER — IPRATROPIUM BROMIDE 0.02 % IN SOLN
0.5000 mg | Freq: Once | RESPIRATORY_TRACT | Status: AC
  Administered 2011-11-16: 0.5 mg via RESPIRATORY_TRACT
  Filled 2011-11-16: qty 2.5

## 2011-11-16 MED ORDER — SODIUM CHLORIDE 0.9 % IJ SOLN
3.0000 mL | Freq: Two times a day (BID) | INTRAMUSCULAR | Status: DC
  Administered 2011-11-17 – 2011-11-19 (×4): 3 mL via INTRAVENOUS

## 2011-11-16 MED ORDER — FUROSEMIDE 40 MG PO TABS
40.0000 mg | ORAL_TABLET | Freq: Two times a day (BID) | ORAL | Status: DC
  Administered 2011-11-17 – 2011-11-19 (×5): 40 mg via ORAL
  Filled 2011-11-16 (×7): qty 1

## 2011-11-16 MED ORDER — HEPARIN BOLUS VIA INFUSION
4000.0000 [IU] | Freq: Once | INTRAVENOUS | Status: AC
  Administered 2011-11-16: 4000 [IU] via INTRAVENOUS

## 2011-11-16 MED ORDER — DEXTROSE 5 % IV SOLN
500.0000 mg | Freq: Once | INTRAVENOUS | Status: AC
  Administered 2011-11-16: 500 mg via INTRAVENOUS
  Filled 2011-11-16: qty 500

## 2011-11-16 MED ORDER — NITROGLYCERIN IN D5W 200-5 MCG/ML-% IV SOLN
2.0000 ug/min | INTRAVENOUS | Status: DC
  Filled 2011-11-16: qty 250

## 2011-11-16 MED ORDER — NITROGLYCERIN IN D5W 200-5 MCG/ML-% IV SOLN
2.0000 ug/min | Freq: Once | INTRAVENOUS | Status: AC
  Administered 2011-11-16: 5 ug/min via INTRAVENOUS
  Filled 2011-11-16: qty 250

## 2011-11-16 MED ORDER — DEXTROSE 5 % IV SOLN
1.0000 g | INTRAVENOUS | Status: DC
  Administered 2011-11-16: 1 g via INTRAVENOUS
  Filled 2011-11-16 (×2): qty 10

## 2011-11-16 MED ORDER — CARVEDILOL 12.5 MG PO TABS
12.5000 mg | ORAL_TABLET | Freq: Two times a day (BID) | ORAL | Status: DC
  Administered 2011-11-17 – 2011-11-19 (×5): 12.5 mg via ORAL
  Filled 2011-11-16 (×8): qty 1

## 2011-11-16 MED ORDER — HYDRALAZINE HCL 50 MG PO TABS
50.0000 mg | ORAL_TABLET | Freq: Three times a day (TID) | ORAL | Status: DC
  Administered 2011-11-16 – 2011-11-17 (×2): 50 mg via ORAL
  Filled 2011-11-16 (×5): qty 1

## 2011-11-16 MED ORDER — SIMVASTATIN 5 MG PO TABS
5.0000 mg | ORAL_TABLET | Freq: Every day | ORAL | Status: DC
  Administered 2011-11-17 – 2011-11-18 (×2): 5 mg via ORAL
  Filled 2011-11-16 (×4): qty 1

## 2011-11-16 MED ORDER — DEXTROSE 5 % IV SOLN
500.0000 mg | INTRAVENOUS | Status: DC
  Administered 2011-11-16: 500 mg via INTRAVENOUS
  Filled 2011-11-16 (×2): qty 500

## 2011-11-16 MED ORDER — SODIUM CHLORIDE 0.9 % IJ SOLN: 3.0000 mL | INTRAMUSCULAR | Status: DC | PRN

## 2011-11-16 MED ORDER — HEPARIN (PORCINE) IN NACL 100-0.45 UNIT/ML-% IJ SOLN
1150.0000 [IU]/h | INTRAMUSCULAR | Status: DC
  Administered 2011-11-16: 1150 [IU]/h via INTRAVENOUS
  Filled 2011-11-16 (×3): qty 250

## 2011-11-16 MED ORDER — ISOSORBIDE MONONITRATE ER 60 MG PO TB24
120.0000 mg | ORAL_TABLET | Freq: Every day | ORAL | Status: DC
  Administered 2011-11-17 – 2011-11-19 (×3): 120 mg via ORAL
  Filled 2011-11-16 (×4): qty 2

## 2011-11-16 MED ORDER — ACETAMINOPHEN 650 MG RE SUPP: 650.0000 mg | Freq: Four times a day (QID) | RECTAL | Status: DC | PRN

## 2011-11-16 MED ORDER — ALBUTEROL SULFATE (5 MG/ML) 0.5% IN NEBU
5.0000 mg | INHALATION_SOLUTION | Freq: Once | RESPIRATORY_TRACT | Status: AC
  Administered 2011-11-16: 5 mg via RESPIRATORY_TRACT
  Filled 2011-11-16: qty 1

## 2011-11-16 MED ORDER — MORPHINE SULFATE 4 MG/ML IJ SOLN
4.0000 mg | Freq: Once | INTRAMUSCULAR | Status: AC
  Administered 2011-11-16: 4 mg via INTRAVENOUS
  Filled 2011-11-16: qty 1

## 2011-11-16 MED ORDER — SODIUM CHLORIDE 0.9 % IV SOLN: 250.0000 mL | INTRAVENOUS | Status: DC | PRN

## 2011-11-16 MED ORDER — AMLODIPINE BESYLATE 10 MG PO TABS
10.0000 mg | ORAL_TABLET | Freq: Every day | ORAL | Status: DC
  Administered 2011-11-16 – 2011-11-19 (×4): 10 mg via ORAL
  Filled 2011-11-16 (×4): qty 1

## 2011-11-16 MED ORDER — ONDANSETRON HCL 4 MG PO TABS: 4.0000 mg | ORAL_TABLET | Freq: Four times a day (QID) | ORAL | Status: DC | PRN

## 2011-11-16 MED ORDER — MORPHINE SULFATE 2 MG/ML IJ SOLN: 1.0000 mg | INTRAMUSCULAR | Status: DC | PRN

## 2011-11-16 MED ORDER — OXYCODONE HCL 5 MG PO TABS
5.0000 mg | ORAL_TABLET | ORAL | Status: DC | PRN
  Administered 2011-11-16: 5 mg via ORAL
  Filled 2011-11-16: qty 1

## 2011-11-16 MED ORDER — SPIRONOLACTONE 12.5 MG HALF TABLET
12.5000 mg | ORAL_TABLET | Freq: Every day | ORAL | Status: DC
  Administered 2011-11-17 – 2011-11-19 (×3): 12.5 mg via ORAL
  Filled 2011-11-16 (×4): qty 1

## 2011-11-16 NOTE — ED Notes (Signed)
EMS-pt woke up with chest pain 10/10, took 3 home nitros without relief, EMS gave 1 nitro and 324 ASA, pts pain 8/10 after nitros. 20g(L)hand. Sinus brady on monitor with hx of MI. Pt with wheezing heard en route.

## 2011-11-16 NOTE — ED Notes (Signed)
Dinner tray ordered. Heart healthy diet. 

## 2011-11-16 NOTE — ED Notes (Signed)
Dinner tray delivered.

## 2011-11-16 NOTE — ED Notes (Signed)
Paging [facility] regarding bed request, pt is still having 8/10 chest pain

## 2011-11-16 NOTE — Progress Notes (Signed)
Heparin per Pharmacy  Heparin level =0.4 on 1150  Units/hr Goal heparin level =0,3-0,7  Heparin level is in desired range. Continue same rate. Will f/u am level.  Cardell Peach, PharmD

## 2011-11-16 NOTE — H&P (Signed)
PCP:   Josue Hector, MD, MD   Chief Complaint:  Chest Pain   HPI: 46 y/o black man with h/o CAD and ICM, presents today with CP that woke him up from sleep. CP was located over upper chest, non-radiating. States this was different than when he had his MI in that he also had cough, diaphoresis and chills. He is found to have a PNA on CXR and we are asked to admit him for further evaluation and management. He had some wheezing on arrival that has resolved with neb treatments.  Allergies:  No Known Allergies    Past Medical History  Diagnosis Date  . CAD (coronary artery disease) 12/2006    NSTEMI 2008 in the setting of profound epistaxis and anemia, last cath 2012 03/2011 (med rx)  . Hypertension   . Hyperlipidemia   . Chronic kidney disease     Average creatine of about 2.3  . Ischemic cardiomyopathy     Prior EF 25%, apical, posterior akinesis, inferior akinesis, moderate LVH, mild MR, mild decreased RV function,  65% EF 2012 echo.  . Anemia     Past Surgical History  Procedure Date  . Cardiac catheterization 2012    Prior to Admission medications   Medication Sig Start Date End Date Taking? Authorizing Provider  amLODipine (NORVASC) 10 MG tablet Take 10 mg by mouth every morning.  05/04/11  Yes Rollene Rotunda, MD  aspirin 325 MG tablet Take 325 mg by mouth daily.     Yes Historical Provider, MD  carvedilol (COREG) 12.5 MG tablet Take 12.5 mg by mouth 2 (two) times daily with a meal. 08/13/11  Yes Suzi Roots, MD  furosemide (LASIX) 40 MG tablet Take 40 mg by mouth 2 (two) times daily.     Yes Historical Provider, MD  hydrALAZINE (APRESOLINE) 50 MG tablet Take 50 mg by mouth 3 (three) times daily.   05/04/11  Yes Rollene Rotunda, MD  isosorbide mononitrate (IMDUR) 120 MG 24 hr tablet Take 120 mg by mouth daily. 10/31/11  Yes Rollene Rotunda, MD  nitroGLYCERIN (NITROSTAT) 0.4 MG SL tablet Place 0.4 mg under the tongue every 5 (five) minutes x 3 doses as needed. For chest  pain 05/02/11 05/01/12 Yes Rollene Rotunda, MD  pravastatin (PRAVACHOL) 40 MG tablet Take 40 mg by mouth every evening.   05/04/11  Yes Rollene Rotunda, MD  spironolactone (ALDACTONE) 25 MG tablet Take 12.5 mg by mouth daily.   05/04/11  Yes Rollene Rotunda, MD    Social History:  reports that he quit smoking about 5 years ago. His smoking use included Cigarettes. He has a 10 pack-year smoking history. He has never used smokeless tobacco. He reports that he does not drink alcohol or use illicit drugs.  Family History  Problem Relation Age of Onset  . Hypertension Mother     deceased  . Diabetes Mother     deceased  . Heart disease Mother     deceased  . Heart attack Father     at age 91, deceased    Review of Systems:  Negative except as mentioned in HPI.  Physical Exam: Blood pressure 158/106, pulse 51, temperature 98 F (36.7 C), temperature source Oral, resp. rate 18, SpO2 95.00%. General: AAOx3, NAD. HEENT: [place], AT, PERL, EOMI, moist mucous membranes. Neck: supple, no JVD, no lymphadenopathy, no bruits, no goiter. CV: RRR, no M/R/G Lungs: mild crackles RLL. Abdomen: S/NT/ND/+BS Ext: no C/C/E, positive pedal pulses. Neuro: intact and non-focal.  Labs on  Admission:  Results for orders placed during the hospital encounter of 11/16/11 (from the past 48 hour(s))  CBC     Status: Normal   Collection Time   11/16/11  7:57 AM      Component Value Range Comment   WBC 5.7  4.0 - 10.5 (K/uL)    RBC 4.74  4.22 - 5.81 (MIL/uL)    Hemoglobin 14.0  13.0 - 17.0 (g/dL)    HCT 45.4  09.8 - 11.9 (%)    MCV 89.0  78.0 - 100.0 (fL)    MCH 29.5  26.0 - 34.0 (pg)    MCHC 33.2  30.0 - 36.0 (g/dL)    RDW 14.7  82.9 - 56.2 (%)    Platelets 193  150 - 400 (K/uL)   DIFFERENTIAL     Status: Abnormal   Collection Time   11/16/11  7:57 AM      Component Value Range Comment   Neutrophils Relative 58  43 - 77 (%)    Neutro Abs 3.3  1.7 - 7.7 (K/uL)    Lymphocytes Relative 24  12 - 46 (%)    Lymphs  Abs 1.4  0.7 - 4.0 (K/uL)    Monocytes Relative 11  3 - 12 (%)    Monocytes Absolute 0.6  0.1 - 1.0 (K/uL)    Eosinophils Relative 6 (*) 0 - 5 (%)    Eosinophils Absolute 0.4  0.0 - 0.7 (K/uL)    Basophils Relative 1  0 - 1 (%)    Basophils Absolute 0.1  0.0 - 0.1 (K/uL)   BASIC METABOLIC PANEL     Status: Abnormal   Collection Time   11/16/11  7:57 AM      Component Value Range Comment   Sodium 142  135 - 145 (mEq/L)    Potassium 3.8  3.5 - 5.1 (mEq/L)    Chloride 104  96 - 112 (mEq/L)    CO2 27  19 - 32 (mEq/L)    Glucose, Bld 105 (*) 70 - 99 (mg/dL)    BUN 20  6 - 23 (mg/dL)    Creatinine, Ser 1.30 (*) 0.50 - 1.35 (mg/dL)    Calcium 8.8  8.4 - 10.5 (mg/dL)    GFR calc non Af Amer 36 (*) >90 (mL/min)    GFR calc Af Amer 42 (*) >90 (mL/min)   TROPONIN I     Status: Normal   Collection Time   11/16/11  7:57 AM      Component Value Range Comment   Troponin I <0.30  <0.30 (ng/mL)   TROPONIN I     Status: Normal   Collection Time   11/16/11 11:53 AM      Component Value Range Comment   Troponin I <0.30  <0.30 (ng/mL)     Radiological Exams on Admission: Dg Chest 2 View  11/16/2011  *RADIOLOGY REPORT*  Clinical Data: Chest pain worse on the right side.  History of cough.  CHEST - 2 VIEW  Comparison: Chest x-ray 08/13/2011.  Findings: Moderate cardiomegaly is unchanged.  Compared to the prior examination, there is slight increase in prominent interstitial markings throughout the lung bases bilaterally and patchy air space disease in the right base.  No pleural effusions. Pulmonary vasculature is normal.  Tortuosity of the thoracic aorta. Atherosclerosis.  IMPRESSION: 1.  Increased bibasilar interstitial markings with evidence of patchy air space disease at the right base.  Findings could suggest sequela of mild aspiration, or could represent an early  bronchopneumonia in the right lower lobe.  Clinical correlation is recommended. 2.  Moderate cardiomegaly is unchanged. 3.  Atherosclerosis.   Original Report Authenticated By: Florencia Reasons, M.D.    Assessment/Plan Principal Problem:  *Chest pain Active Problems:  HYPERLIPIDEMIA  HYPERTENSION  Chronic systolic heart failure  CHRONIC KIDNEY DISEASE UNSPECIFIED  CAP (community acquired pneumonia)   #1 Chest Pain: Most likely related to his PNA, however I agree with cards that given his h/o CAD we need to take this possibility seriously. He is still having CP despite a NTG drip. Has also been started on a heparin drip by cards. So far troponins are negative. Will continue to cycle. Will be on rocephin and azithromycin for CAP coverage. Blood cultures have been ordered as well.  #2 HTN: BP is better controlled with increase in dose of nitro drip. Have also started his home meds back.  #3 Dispo: Given his ongoing CP and nitro drip, tele floor has refused him. Will need to be admitted to SDU.  Time Spent on Admission: 60 minutes  HERNANDEZ ACOSTA,Izzabelle Bouley [facility] Pager: [phone] 11/16/2011, 5:11 PM

## 2011-11-16 NOTE — Consult Note (Signed)
CARDIOLOGY CONSULT NOTE  Patient ID: [name], MRN: [id number], DOB/AGE: [birth year] 46 y.o. Admit date: 11/16/2011 Date of Consult: 11/16/2011  Primary Physician: Josue Hector, MD, MD Primary Cardiologist: Rollene Rotunda, MD  Chief Complaint: chest pain  NWG:NFAO is a 46y/o male with known hx of CAD, prior ICM who presented to [facility] with complaints of chest pain, cough, and chills. He has had intermittent CP at rest, but also increased to 10/10 when he coughs. He also reports numbness throughout his body when he coughs. He has also had diaphoresis along with difficulty with feeling comfortable regulating his temperature. POC troponin is negative. CXR demonstrates increased markings felt possibly bronchopneumonia in the RLL. He has also had some mild LEE the other day that is better today but he wonders if this is related to his recent high blood pressures. No recent long travel or surgeries. He usually can walk 2 miles without any symptoms. He is not currently SOB but did have wheezing on admission that was improved with nebulizers. He reports compliance with his medications. He is being admitted by the medicine service for treatment of his pneumonia and we are asked to see him for his chest pain.   He is afebrile. He is alternating hot and clear that is quite diaphoretic. He denies URI symptoms. No one at home is ill.    Past Medical History  Diagnosis Date  . CAD (coronary artery disease) 12/2006    NSTEMI 2008 in the setting of profound epistaxis and anemia, last cath 2012 03/2011 (med rx)  . Hypertension   . Hyperlipidemia   . Chronic kidney disease     Average creatine of about 2.3  . Ischemic cardiomyopathy     Prior EF 25%, apical, posterior akinesis, inferior akinesis, moderate LVH, mild MR, mild decreased RV function,  65% EF 2012 echo.  . Anemia      Cath 03/2011:  FINAL ASSESSMENT:   1. Total occlusion of the proximal right coronary artery with  left-to-right collaterals supplying the distal right coronary artery branches. 2. Mild diffuse nonobstructive disease of the left anterior descending      and left circumflex.  RECOMMENDATIONS:  The patient will be treated with ongoing medical   therapy for treatment of his coronary artery disease and hypertension.   2D Echo Study Conclusions 03/2011   - Left ventricle: Wall thickness was increased in a pattern of moderate LVH. Systolic function was normal. The estimated ejection fraction was in the range of 55% to 60%. Wall motion was normal; there were no regional wall motion abnormalities. Doppler parameters are consistent with abnormal left ventricular relaxation (grade 1 diastolic dysfunction). - Aortic valve: Trivial regurgitation.- Left atrium: The atrium was moderately to severely dilated. - Right atrium: The atrium was mildly dilated.  Surgical History:  Past Surgical History  Procedure Date  . Cardiac catheterization 2012     Home Meds: Prior to Admission medications   Medication Sig Start Date End Date Taking? Authorizing Provider  amLODipine (NORVASC) 10 MG tablet Take 10 mg by mouth every morning.  05/04/11  Yes Rollene Rotunda, MD  aspirin 325 MG tablet Take 325 mg by mouth daily.     Yes Historical Provider, MD  carvedilol (COREG) 12.5 MG tablet Take 12.5 mg by mouth 2 (two) times daily with a meal. 08/13/11  Yes Suzi Roots, MD  furosemide (LASIX) 40 MG tablet Take 40 mg by mouth 2 (two) times daily.     Yes Historical Provider,  MD  hydrALAZINE (APRESOLINE) 50 MG tablet Take 50 mg by mouth 3 (three) times daily.   05/04/11  Yes Rollene Rotunda, MD  isosorbide mononitrate (IMDUR) 120 MG 24 hr tablet Take 120 mg by mouth daily. 10/31/11  Yes Rollene Rotunda, MD  nitroGLYCERIN (NITROSTAT) 0.4 MG SL tablet Place 0.4 mg under the tongue every 5 (five) minutes x 3 doses as needed. For chest pain 05/02/11 05/01/12 Yes Rollene Rotunda, MD  pravastatin (PRAVACHOL) 40 MG tablet Take 40 mg by  mouth every evening.   05/04/11  Yes Rollene Rotunda, MD  spironolactone (ALDACTONE) 25 MG tablet Take 12.5 mg by mouth daily.   05/04/11  Yes Rollene Rotunda, MD    Inpatient Medications:     . albuterol  5 mg Nebulization Once  . azithromycin  500 mg Intravenous Once  . cefTRIAXone (ROCEPHIN)  IV  1 g Intravenous Once  . ipratropium  0.5 mg Nebulization Once  .  morphine injection  4 mg Intravenous Once  . nitroGLYCERIN  2-200 mcg/min Intravenous Once    Allergies: No Known Allergies  History   Social History  . Marital Status: Legally Separated    Spouse Name: N/A    Number of Children: 6  . Years of Education: N/A   Occupational History  . Unemployed    Social History Main Topics  . Smoking status: Former Smoker -- 1.0 packs/day for 10 years    Types: Cigarettes    Quit date: 11/16/2006  . Smokeless tobacco: Never Used  . Alcohol Use: No  . Drug Use: No  . Sexually Active: Not on file   Other Topics Concern  . Not on file   Social History Narrative   The patient lives in [place] with his sister. He is legally separated and has 6 healthy children who do not live with him. He is not employed and is trying to get disability. He walks 1-2 miles about 2X per week if he is feeling up to it, but c/o SOB with this activity.      Family History  Problem Relation Age of Onset  . Hypertension Mother     deceased  . Diabetes Mother     deceased  . Heart disease Mother     deceased  . Heart attack Father     at age 46, deceased     Review of Systems: General: unsure if has had fever but has had subjective chills, sweats. No weight changes.  Cardiovascular: see above Dermatological: negative for rash Respiratory: +cough Urologic: negative for hematuria Abdominal: negative for nausea, vomiting, diarrhea, bright red blood per rectum, melena, or hematemesis Neurologic: negative for visual changes, syncope, or dizziness All other systems reviewed and are otherwise  negative except as noted above.  Labs:  [facility] 11/16/11 0757  CKTOTAL --  CKMB --  TROPONINI <0.30   Lab Results  Component Value Date   WBC 5.7 11/16/2011   HGB 14.0 11/16/2011   HCT 42.2 11/16/2011   MCV 89.0 11/16/2011   PLT 193 11/16/2011     Lab 11/16/11 0757  NA 142  K 3.8  CL 104  CO2 27  BUN 20  CREATININE 2.12*  CALCIUM 8.8  PROT --  BILITOT --  ALKPHOS --  ALT --  AST --  GLUCOSE 105*   Radiology/Studies:  1. Chest 2 View 11/16/2011  *RADIOLOGY REPORT*  Clinical Data: Chest pain worse on the right side.  History of cough.  CHEST - 2 VIEW  Comparison:  Chest x-ray 08/13/2011.  Findings: Moderate cardiomegaly is unchanged.  Compared to the prior examination, there is slight increase in prominent interstitial markings throughout the lung bases bilaterally and patchy air space disease in the right base.  No pleural effusions. Pulmonary vasculature is normal.  Tortuosity of the thoracic aorta. Atherosclerosis.  IMPRESSION: 1.  Increased bibasilar interstitial markings with evidence of patchy air space disease at the right base.  Findings could suggest sequela of mild aspiration, or could represent an early bronchopneumonia in the right lower lobe.  Clinical correlation is recommended. 2.  Moderate cardiomegaly is unchanged. 3.  Atherosclerosis.  Original Report Authenticated By: Florencia Reasons, M.D.   EKG: NSR ST upsloping V2-V4 with poor R wave progression similar to 10/2011  Physical Exam: Blood pressure 158/117, pulse 54, temperature 98 F (36.7 C), temperature source Oral, resp. rate 16, SpO2 94.00%. General: Well developed, well nourished, diaphoretic and complains of 8/10 chest pain but appears quite comfortable. Head: Normocephalic, atraumatic, sclera non-icteric, no xanthomas, nares are without discharge.  Neck: Negative for carotid bruits. JVD not elevated positive HJR. Lungs: Clear bilaterally to auscultation without wheezes, rales, or rhonchi. Breathing is  unlabored. PA student appreciated right-sided crackles Heart: RRR with S1 S2. 2/6 murmur heard along the right sternal border  Abdomen: Soft, non-tender, non-distended with normoactive bowel sounds. No hepatomegaly. No rebound/guarding. No obvious abdominal masses. Msk:  Strength and tone appear normal for age. Extremities: No clubbing or cyanosis. Tr edema.  Distal pedal pulses are 2+ and equal bilaterally. Neuro: Alert and oriented X 3. Moves all extremities spontaneously. Psych:  Responds to questions appropriately with a normal affect.     Assessment and Plan:   1. Chest pain in the setting of RLL bronchopneumonia 2. Known CAD 3. Prior hx of ICM with most recent EF normalized 4. Uncontrolled HTN at present 5. Chronic renal insufficiency  The patient's presentation is acute and with his prior history of coronary disease is concerning. The fact that he is having chills, has a chest x-ray suggestive of pneumonia and a pleuritic component to his pain however, suggests that an acute coronary event is not occurring. His initial troponins were normal. However, because of our high degree of suspicion we will follow these closely. At this juncture will not undertake catheterization emergently.  His blood pressure is in good control. We'll increase his nitroglycerin. We will plan to increase his hydralazine. Blood pressure consultation might be of value. We'll need to review again the data on renal denervation. In the short term he may need minoxidil.    Continue heparin, IV nitroglycerin and aspirin. Serial cardiac enzymes.  Signed, Ronie Spies PA-C 11/16/2011, 11:37 AM

## 2011-11-16 NOTE — Progress Notes (Signed)
ANTICOAGULATION CONSULT NOTE - Initial Consult  Pharmacy Consult for Heparin  Indication: chest pain/ACS  No Known Allergies  Patient Measurements:  Ht 70" TBW 98kg IBW 73 kg Heparin Dosing Weight: 98kg  Vital Signs: Temp: 98.6 F (37 C) (03/15 1418) Temp src: Oral (03/15 1418) BP: 168/112 mmHg (03/15 1418) Pulse Rate: 51  (03/15 1418)  Labs:  Basename 11/16/11 1153 11/16/11 0757  HGB -- 14.0  HCT -- 42.2  PLT -- 193  APTT -- --  LABPROT -- --  INR -- --  HEPARINUNFRC -- --  CREATININE -- 2.12*  CKTOTAL -- --  CKMB -- --  TROPONINI <0.30 <0.30   The CrCl is unknown because both a height and weight (above a minimum accepted value) are required for this calculation.  Medical History: Past Medical History  Diagnosis Date  . CAD (coronary artery disease) 12/2006    NSTEMI 2008 in the setting of profound epistaxis and anemia, last cath 2012 03/2011 (med rx)  . Hypertension   . Hyperlipidemia   . Chronic kidney disease     Average creatine of about 2.3  . Ischemic cardiomyopathy     Prior EF 25%, apical, posterior akinesis, inferior akinesis, moderate LVH, mild MR, mild decreased RV function,  65% EF 2012 echo.  . Anemia    Assessment: 45yom with Hx CAD presented with CP. Initial Tp negative.  Found to have pna on Cxr treated with Azith/Roceph.  Also plan to r/o MI and treat hypertension.    Goal of Therapy:  Heparin level 0.3-0.7 units/ml   Plan:  Heparin bolus 4000 uts iv x1 Heparin drip rate 1150 uts/hr Draw HL 6hr after drip starts and daily  Marcelino Scot 11/16/2011,2:22 PM

## 2011-11-16 NOTE — ED Notes (Signed)
RT aware of need for breathing tx.

## 2011-11-16 NOTE — ED Provider Notes (Signed)
History     CSN: [id number]  Arrival date & time 11/16/11  5621   First MD Initiated Contact with Patient 11/16/11 [id number]      Chief Complaint  Patient presents with  . Chest Pain    (Consider location/radiation/quality/duration/timing/severity/associated sxs/prior treatment) HPI Comments: Patient is brought by EMS do to episode of chest pain associated with coughing that began approximately 5 or 5:30 this morning. He reports his symptoms woke him up. He normally does have chest pain very frequently but usually is resistant to his nitroglycerin he reports. He reports that today's episode was somewhat different in that usually it does not wake him up from sleep. He reports because he did so he did worry him more than usual. The patient does have a history of coronary disease status post MI a few years ago. He reports that he had a stress test perhaps about a year ago that as far as he knows was okay. His medical records show that his cardiologist is with the [facility].  Patient reports he does not smoke but also has a history of pneumonia in the past. He reports he has felt somewhat hot and cold this morning with associated chills but no known fever. EMS also reports that his blood pressure has been unusually high despite him taking 1 nitroglycerin at home and 3 EMS. EMS also provided 4 baby aspirin. Patient reports his chest pain is down from a 10 out of 10 to a 10. He reports the chest pain is worse on the right side, does not radiate. He does report shortness of breath and EMS reports that the patient noted that the pain seemed worse with movement of his shoulders and arms as well. Patient denies any unusual sweating and denies any GI symptoms such as nausea vomiting or diarrhea.  EMS reported some exp wheezing PTA, but had not had time to provide any nebs.  Pt does not use nebulizer or inhalers at home typically.    Patient is a 46 y.o. male presenting with chest pain. The history is provided by the  patient.  Chest Pain Primary symptoms include cough and wheezing. Pertinent negatives for primary symptoms include no fever, no nausea and no vomiting.  Pertinent negatives for associated symptoms include no diaphoresis.     Past Medical History  Diagnosis Date  . History of non-ST elevation myocardial infarction (NSTEMI) 12/2006    In the setting of porfound epistaxis and anemia  . CAD (coronary artery disease)   . Abnormal echocardiogram 04/2008    EF 25%, apical, posterior akinesis, inferior akinesis, moderate LVH, mild MR, mild decreased RV function,  65% EF 2012 echo.  . Hypertension   . Hyperlipidemia   . Chronic kidney disease     Average creatine of about 2.3  . Systolic CHF, chronic   . Ischemic cardiomyopathy   . Anemia     Past Surgical History  Procedure Date  . Cardiac catheterization 12/2006    Demonstrated 50% mid left circumflex lesion. RCA was severely diseased with 80% proximal disease in 3 seperate locations, 90% mid desiase with ulcerated plaque, RV marginal totally occluded and the mid RCA totally occluded with left to right collaterals to the distal RCA. Medical therapy was pursued at that time.    Family History  Problem Relation Age of Onset  . Hypertension Mother   . Diabetes Mother     History  Substance Use Topics  . Smoking status: Former Games developer  . Smokeless tobacco: Not on  file  . Alcohol Use: No      Review of Systems  Constitutional: Positive for chills. Negative for fever and diaphoresis.  Respiratory: Positive for cough and wheezing.   Cardiovascular: Positive for chest pain.  Gastrointestinal: Negative for nausea and vomiting.  All other systems reviewed and are negative.    Allergies  Review of patient's allergies indicates no known allergies.  Home Medications   Current Outpatient Rx  Name Route Sig Dispense Refill  . AMLODIPINE BESYLATE 10 MG PO TABS Oral Take 10 mg by mouth every morning.     . ASPIRIN 325 MG PO TABS  Oral Take 325 mg by mouth daily.      Marland Kitchen CARVEDILOL 12.5 MG PO TABS Oral Take 12.5 mg by mouth 2 (two) times daily with a meal.    . FUROSEMIDE 40 MG PO TABS Oral Take 40 mg by mouth 2 (two) times daily.      Marland Kitchen HYDRALAZINE HCL 50 MG PO TABS Oral Take 50 mg by mouth 3 (three) times daily.      . ISOSORBIDE MONONITRATE ER 120 MG PO TB24 Oral Take 120 mg by mouth daily.    Marland Kitchen NITROGLYCERIN 0.4 MG SL SUBL Sublingual Place 0.4 mg under the tongue every 5 (five) minutes x 3 doses as needed. For chest pain    . PRAVASTATIN SODIUM 40 MG PO TABS Oral Take 40 mg by mouth every evening.      Marland Kitchen SPIRONOLACTONE 25 MG PO TABS Oral Take 12.5 mg by mouth daily.        BP 165/115  Pulse 54  Temp(Src) 98 F (36.7 C) (Oral)  Resp 18  SpO2 95%  Physical Exam  Nursing note and vitals reviewed. Constitutional: He is oriented to person, place, and time. He appears well-developed and well-nourished. No distress.  HENT:  Head: Normocephalic and atraumatic.  Eyes: Pupils are equal, round, and reactive to light.  Neck: Normal range of motion. Neck supple.  Cardiovascular: Normal rate.   Pulmonary/Chest: Effort normal. No respiratory distress. He has wheezes. He has no rales. He exhibits no tenderness.       Mild expiratory wheezing left middle and left base lung fields. Moderate expiratory and slight inspiratory wheezing diffuse right lung field. Minimal tachypnea present. No accessory muscle use.  Abdominal: Soft. Normal appearance and bowel sounds are normal. He exhibits no distension. There is no tenderness. There is no rebound, no guarding and no CVA tenderness.  Neurological: He is alert and oriented to person, place, and time.  Skin: Skin is warm, dry and intact. No rash noted. He is not diaphoretic.  Psychiatric: He has a normal mood and affect. His mood appears not anxious. His affect is not blunt and not labile. His speech is not rapid and/or pressured, not delayed and not tangential. He does not exhibit a  depressed mood.    ED Course  Procedures (including critical care time)  CRITICAL CARE Performed by: Lear Ng.   Total critical care time: 30 min  Critical care time was exclusive of separately billable procedures and treating other patients.  Critical care was necessary to treat or prevent imminent or life-threatening deterioration.  Critical care was time spent personally by me on the following activities: development of treatment plan with patient and/or surrogate as well as nursing, discussions with consultants, evaluation of patient's response to treatment, examination of patient, obtaining history from patient or surrogate, ordering and performing treatments and interventions, ordering and review of laboratory studies, ordering  and review of radiographic studies, pulse oximetry and re-evaluation of patient's condition.   Labs Reviewed  DIFFERENTIAL - Abnormal; Notable for the following:    Eosinophils Relative 6 (*)    All other components within normal limits  BASIC METABOLIC PANEL - Abnormal; Notable for the following:    Glucose, Bld 105 (*)    Creatinine, Ser 2.12 (*)    GFR calc non Af Amer 36 (*)    GFR calc Af Amer 42 (*)    All other components within normal limits  CBC  TROPONIN I   Dg Chest 2 View  11/16/2011  *RADIOLOGY REPORT*  Clinical Data: Chest pain worse on the right side.  History of cough.  CHEST - 2 VIEW  Comparison: Chest x-ray 08/13/2011.  Findings: Moderate cardiomegaly is unchanged.  Compared to the prior examination, there is slight increase in prominent interstitial markings throughout the lung bases bilaterally and patchy air space disease in the right base.  No pleural effusions. Pulmonary vasculature is normal.  Tortuosity of the thoracic aorta. Atherosclerosis.  IMPRESSION: 1.  Increased bibasilar interstitial markings with evidence of patchy air space disease at the right base.  Findings could suggest sequela of mild aspiration, or could  represent an early bronchopneumonia in the right lower lobe.  Clinical correlation is recommended. 2.  Moderate cardiomegaly is unchanged. 3.  Atherosclerosis.  Original Report Authenticated By: Florencia Reasons, M.D.     1. Community acquired pneumonia   2. Hypertension   3. Chest pain     RA sat is 95% and normal  EKG at time of 731, shows sinus bradycardia at a rate of 55. Left ventricular hypertrophy is present with RSR prime noted in lead V2. PR interval has shortened compared to EKG on 08/13/2011. Nonspecific T waves noted l. Small Q waves noted in lateral leads that seem unchanged compared to his most recent prior EKG.  8:30 AM Pt after first neb, wheezing is much improved, however pt reports not feeling any better regarding CP or SOB.  Thus, will start on IV NTG for potentially unstable angina.     9:16 AM I reviewed Pt's CXR.  Given his right side CP and his lung sounds worse in itially on exam, I suspect pt does have pnuemonia on right side.  Will start on abx.  Givne pain, would be prudent to continue rule out although I am less suspicious that this is ACS, although elevated BP that he had could certainly be playing a role in some anginal symptoms given the degree of stenosis that he has.  First troponin however is normal.    10:09 AM Discussed with [facility] who will admit for CAP, however given continued CP and risks, I will consult [facility]ology on their behalf since UA is still possible.   MDM  Patient's history and examination findings are more suggestive of a pulmonary cause for his chest discomfort. Patient does have significant wheezing on examination and in association with his coughing and subjective chills, the patient may have bronchitis or pneumonia. Patient reports that he is not smoke currently although he has a former history of smoking. My plan is to obtain a chest x-ray and provide nebulizer treatments to assess for improvement of his symptoms. His blood  pressure being elevated this likely due to his chronic history as well as not yet having had his morning pressure medications. Given his significant history of coronary disease based on his cardiac catheterization results from 2008, I will still  cycle his troponins while he was here and in the presence of any ongoing chest discomfort. Patient has hardly been given adequate nitroglycerin and aspirin to cover for ACS.         Gavin Pound. Rameses Ou, MD 11/16/11 1010

## 2011-11-16 NOTE — ED Notes (Signed)
Changing bed to step down per dr Michel Santee, pt okayed to eat, will give pt coke and order meal tray, admitting aware of chest pain, will come admit pt as soon as possible.

## 2011-11-16 NOTE — ED Notes (Signed)
Meal tray ordered 

## 2011-11-16 NOTE — ED Notes (Signed)
Pt states pain is mild if he is not coughing, states when he coughs pain is a 8/10.

## 2011-11-16 NOTE — ED Notes (Signed)
Called [phone] to give report, room is not cleaned

## 2011-11-16 NOTE — ED Notes (Signed)
Admitting at bedside 

## 2011-11-16 NOTE — ED Notes (Signed)
[id number] Ready 

## 2011-11-16 NOTE — ED Notes (Signed)
Spoke with pharmacy regarding delay in heparin, states will be here immediately

## 2011-11-16 NOTE — ED Notes (Signed)
RT at bedside.

## 2011-11-17 DIAGNOSIS — J189 Pneumonia, unspecified organism: Secondary | ICD-10-CM

## 2011-11-17 DIAGNOSIS — I369 Nonrheumatic tricuspid valve disorder, unspecified: Secondary | ICD-10-CM

## 2011-11-17 DIAGNOSIS — R079 Chest pain, unspecified: Secondary | ICD-10-CM

## 2011-11-17 LAB — CBC
HCT: 40.3 % (ref 39.0–52.0)
MCHC: 33 g/dL (ref 30.0–36.0)
MCV: 88.2 fL (ref 78.0–100.0)
RDW: 14.6 % (ref 11.5–15.5)

## 2011-11-17 LAB — CARDIAC PANEL(CRET KIN+CKTOT+MB+TROPI)
CK, MB: 2 ng/mL (ref 0.3–4.0)
Troponin I: <0.3 ng/mL (ref <0.30)
Troponin I: <0.3 ng/mL (ref <0.30)

## 2011-11-17 LAB — BASIC METABOLIC PANEL
BUN: 21 mg/dL (ref 6–23)
Creatinine, Ser: 2.09 mg/dL — ABNORMAL HIGH (ref 0.50–1.35)
GFR calc Af Amer: 43 mL/min — ABNORMAL LOW (ref >90)
GFR calc non Af Amer: 37 mL/min — ABNORMAL LOW (ref >90)
Glucose, Bld: 101 mg/dL — ABNORMAL HIGH (ref 70–99)

## 2011-11-17 MED ORDER — HYDRALAZINE HCL 50 MG PO TABS
100.0000 mg | ORAL_TABLET | Freq: Three times a day (TID) | ORAL | Status: DC
  Administered 2011-11-17 – 2011-11-19 (×6): 100 mg via ORAL
  Filled 2011-11-17 (×8): qty 2

## 2011-11-17 MED ORDER — MOXIFLOXACIN HCL 400 MG PO TABS
400.0000 mg | ORAL_TABLET | Freq: Every day | ORAL | Status: DC
  Administered 2011-11-17 – 2011-11-18 (×2): 400 mg via ORAL
  Filled 2011-11-17 (×3): qty 1

## 2011-11-17 NOTE — Progress Notes (Signed)
Subjective: 46 y/o [name] came to ER for chest pain and admitted to be treated for his chest pain and possible pneumonia.   He states today that it hurts across his chest only when he coughs. Cough is dry and started on Tuesday along with fevers and chills. He declines cough medication. No other complaints. He is not a smoker.   Objective: Blood pressure 158/110, pulse 41, temperature 97.8 F (36.6 C), temperature source Oral, resp. rate 17, height 5\' 10"  (1.778 m), weight 88.3 kg (194 lb 10.7 oz), SpO2 40.00%. Weight change:   Intake/Output Summary (Last 24 hours) at 11/17/11 1042 Last data filed at 11/17/11 0600  Gross per 24 hour  Intake    648 ml  Output    900 ml  Net   -252 ml    Physical Exam: General appearance: alert, cooperative and no distress Lungs:mild ronchi but no crackles or wheeze.  Heart: regular rate and rhythm, S1, S2 normal, no murmur, click, rub or gallop Abdomen: soft, non-tender; bowel sounds normal; no masses,  no organomegaly Extremities: extremities normal, atraumatic, no cyanosis or edema  Lab Results:  Basename 11/17/11 0323 11/16/11 0757  NA 139 142  K 3.9 3.8  CL 102 104  CO2 27 27  GLUCOSE 101* 105*  BUN 21 20  CREATININE 2.09* 2.12*  CALCIUM 8.3* 8.8  MG -- --  PHOS -- --   No results found for this basename: AST:2,ALT:2,ALKPHOS:2,BILITOT:2,PROT:2,ALBUMIN:2 in the last 72 hours No results found for this basename: LIPASE:2,AMYLASE:2 in the last 72 hours  Basename 11/17/11 0323 11/16/11 0757  WBC 5.3 5.7  NEUTROABS -- 3.3  HGB 13.3 14.0  HCT 40.3 42.2  MCV 88.2 89.0  PLT 185 193    Basename 11/17/11 0945 11/17/11 0317 11/16/11 1723  CKTOTAL 120 139 164  CKMB PENDING 2.0 2.2  CKMBINDEX -- -- --  TROPONINI <0.30 <0.30 <0.30   No components found with this basename: POCBNP:3 No results found for this basename: DDIMER:2 in the last 72 hours No results found for this basename: HGBA1C:2 in the last 72 hours No results found for this  basename: CHOL:2,HDL:2,LDLCALC:2,TRIG:2,CHOLHDL:2,LDLDIRECT:2 in the last 72 hours No results found for this basename: TSH,T4TOTAL,FREET3,T3FREE,THYROIDAB in the last 72 hours No results found for this basename: VITAMINB12:2,FOLATE:2,FERRITIN:2,TIBC:2,IRON:2,RETICCTPCT:2 in the last 72 hours  Micro Results: Recent Results (from the past 240 hour(s))  MRSA PCR SCREENING     Status: Normal   Collection Time   11/16/11  8:13 PM      Component Value Range Status Comment   MRSA by PCR NEGATIVE  NEGATIVE  Final     Studies/Results: Dg Chest 2 View  11/16/2011  *RADIOLOGY REPORT*  Clinical Data: Chest pain worse on the right side.  History of cough.  CHEST - 2 VIEW  Comparison: Chest x-ray 08/13/2011.  Findings: Moderate cardiomegaly is unchanged.  Compared to the prior examination, there is slight increase in prominent interstitial markings throughout the lung bases bilaterally and patchy air space disease in the right base.  No pleural effusions. Pulmonary vasculature is normal.  Tortuosity of the thoracic aorta. Atherosclerosis.  IMPRESSION: 1.  Increased bibasilar interstitial markings with evidence of patchy air space disease at the right base.  Findings could suggest sequela of mild aspiration, or could represent an early bronchopneumonia in the right lower lobe.  Clinical correlation is recommended. 2.  Moderate cardiomegaly is unchanged. 3.  Atherosclerosis.  Original Report Authenticated By: Florencia Reasons, M.D.    Medications: Scheduled Meds:   .  amLODipine  10 mg Oral Daily  . aspirin  325 mg Oral Daily  . azithromycin  500 mg Intravenous Once  . carvedilol  12.5 mg Oral BID WC  . cefTRIAXone (ROCEPHIN)  IV  1 g Intravenous Once  . furosemide  40 mg Oral BID  . heparin  4,000 Units Intravenous Once  . hydrALAZINE  50 mg Oral TID  . isosorbide mononitrate  120 mg Oral Daily  . moxifloxacin  400 mg Oral q1800  . simvastatin  5 mg Oral q1800  . sodium chloride  3 mL Intravenous  Q12H  . spironolactone  12.5 mg Oral Daily  . DISCONTD: aspirin EC  325 mg Oral Daily  . DISCONTD: azithromycin  500 mg Intravenous Q24H  . DISCONTD: cefTRIAXone (ROCEPHIN)  IV  1 g Intravenous Q24H   Continuous Infusions:   . DISCONTD: heparin 1,150 Units/hr (11/16/11 1500)  . DISCONTD: nitroGLYCERIN 85 mcg/min (11/17/11 0600)   PRN Meds:.sodium chloride, acetaminophen, acetaminophen, morphine, ondansetron (ZOFRAN) IV, ondansetron, oxyCODONE, senna-docusate, sodium chloride  Assessment/Plan:   *Chest pain -Agree that this is likely related to a lung infection. Possibly a pneumonia but at minimum he has a bronchitis. -Will change over to PO Avelox - can cont for 5 days -Cards recommends stopping Heparin and Nitro which I agree with.  -ECHo currently being performed -Per Dr Fabio Bering notes plans for stress test on Monday? Would prefer to wait to ensure he is recovering from his respiratory infection prior to doing a stress test esp as current pain is suspected to be non-cardiac.   CAP (community acquired pneumonia) Cough causing his pain - antibiotics as above.  Cont O2 as needed for hypoxia.    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  Unable to increase BB or CCB due to bradycardia Will increase Hydralazine to 100 Q8 but will likely have to add on another medication tomorrow.  Renal MRI done in 2011 was inadequate in visualizing the Left renal artery- not sure if it was repeated - he has not had a renal duplex in house either.   - needs to get on of the above - will go ahead and order a duplex but this may not be done over  the weekend  HYPERLIPIDEMIA Cont statin.   Disposition- can transfer out of SDU if bed available on telemetry. Mobilize. Home when cleared by cards.    LOS: 1 day   [facility] [phone] 11/17/2011, 10:42 AM

## 2011-11-17 NOTE — Progress Notes (Signed)
ANTICOAGULATION CONSULT NOTE - Follow Up Consult  Pharmacy Consult for heparin Indication: chest pain/ACS  No Known Allergies  Patient Measurements: Height: 5\' 10"  (177.8 cm) Weight: 194 lb 10.7 oz (88.3 kg) IBW/kg (Calculated) : 73  Heparin Dosing Weight: 88.3kg  Vital Signs: Temp: 98.7 F (37.1 C) (03/16 0330) Temp src: Oral (03/16 0330) BP: 151/79 mmHg (03/16 0700) Pulse Rate: 41  (03/16 0700)  Labs:  Basename 11/17/11 0323 11/17/11 0317 11/16/11 2111 11/16/11 1723 11/16/11 1153 11/16/11 0757  HGB 13.3 -- -- -- -- 14.0  HCT 40.3 -- -- -- -- 42.2  PLT 185 -- -- -- -- 193  APTT -- -- -- -- -- --  LABPROT -- -- -- -- -- --  INR -- -- -- -- -- --  HEPARINUNFRC 0.51 -- 0.40 -- -- --  CREATININE 2.09* -- -- -- -- 2.12*  CKTOTAL -- 139 -- 164 -- --  CKMB -- 2.0 -- 2.2 -- --  TROPONINI -- <0.30 -- <0.30 <0.30 --   Estimated Creatinine Clearance: 50.5 ml/min (by C-G formula based on Cr of 2.09).   Assessment: 45 yoM on IV heparin for ACS.  HL therapeutic @ 0.51. No bleeding noted. Plts, hgb ok.   Goal of Therapy:  Heparin level 0.3-0.7 units/ml   Plan:  Continue heparin at current rate.  F/u daily heparin levels, CBC.    English Craighead E 11/17/2011,8:06 AM

## 2011-11-17 NOTE — Progress Notes (Signed)
  Echocardiogram 2D Echocardiogram has been performed.  [name] 11/17/2011, 10:49 AM

## 2011-11-17 NOTE — Progress Notes (Signed)
Patient ID: [name], male   DOB: 04-12-1966, 46 y.o.   MRN: [id number] @ Subjective:  Pain much better    Objective:  Filed Vitals:   11/17/11 0500 11/17/11 0600 11/17/11 0700 11/17/11 0800  BP: 154/111 152/80 151/79   Pulse: 51 56 41   Temp:    97.8 F (36.6 C)  TempSrc:    Oral  Resp: 14 14 17    Height:      Weight:      SpO2: 95% 91% 40%     Intake/Output from previous day:  Intake/Output Summary (Last 24 hours) at 11/17/11 0920 Last data filed at 11/17/11 0600  Gross per 24 hour  Intake    648 ml  Output    900 ml  Net   -252 ml    Physical Exam: Affect appropriate Middle age black male sleepy HEENT: normal Neck supple with no adenopathy JVP normal no bruits no thyromegaly Lungs Rhochi at bases Heart:  S1/S2 no murmur, no rub, gallop or click PMI normal Abdomen: benighn, BS positve, no tenderness, no AAA no bruit.  No HSM or HJR Distal pulses intact with no bruits No edema Neuro non-focal Skin warm and dry No muscular weakness   Lab Results: Basic Metabolic Panel:  Basename 11/17/11 0323 11/16/11 0757  NA 139 142  K 3.9 3.8  CL 102 104  CO2 27 27  GLUCOSE 101* 105*  BUN 21 20  CREATININE 2.09* 2.12*  CALCIUM 8.3* 8.8  MG -- --  PHOS -- --   CBC:  Basename 11/17/11 0323 11/16/11 0757  WBC 5.3 5.7  NEUTROABS -- 3.3  HGB 13.3 14.0  HCT 40.3 42.2  MCV 88.2 89.0  PLT 185 193   Cardiac Enzymes:  Basename 11/17/11 0317 11/16/11 1723 11/16/11 1153  CKTOTAL 139 164 --  CKMB 2.0 2.2 --  CKMBINDEX -- -- --  TROPONINI <0.30 <0.30 <0.30    Imaging: Dg Chest 2 View  11/16/2011  *RADIOLOGY REPORT*  Clinical Data: Chest pain worse on the right side.  History of cough.  CHEST - 2 VIEW  Comparison: Chest x-ray 08/13/2011.  Findings: Moderate cardiomegaly is unchanged.  Compared to the prior examination, there is slight increase in prominent interstitial markings throughout the lung bases bilaterally and patchy air space disease in the right  base.  No pleural effusions. Pulmonary vasculature is normal.  Tortuosity of the thoracic aorta. Atherosclerosis.  IMPRESSION: 1.  Increased bibasilar interstitial markings with evidence of patchy air space disease at the right base.  Findings could suggest sequela of mild aspiration, or could represent an early bronchopneumonia in the right lower lobe.  Clinical correlation is recommended. 2.  Moderate cardiomegaly is unchanged. 3.  Atherosclerosis.  Original Report Authenticated By: Florencia Reasons, M.D.    Cardiac Studies:  ECG: SR LVH no acute changes   Telemetry: NSR no arrhythmia  Echo: pending  Medications:     . amLODipine  10 mg Oral Daily  . aspirin  325 mg Oral Daily  . azithromycin  500 mg Intravenous Once  . azithromycin  500 mg Intravenous Q24H  . carvedilol  12.5 mg Oral BID WC  . cefTRIAXone (ROCEPHIN)  IV  1 g Intravenous Once  . cefTRIAXone (ROCEPHIN)  IV  1 g Intravenous Q24H  . furosemide  40 mg Oral BID  . heparin  4,000 Units Intravenous Once  . hydrALAZINE  50 mg Oral TID  . isosorbide mononitrate  120 mg Oral Daily  . simvastatin  5 mg Oral q1800  . sodium chloride  3 mL Intravenous Q12H  . spironolactone  12.5 mg Oral Daily  . DISCONTD: aspirin EC  325 mg Oral Daily       . heparin 1,150 Units/hr (11/16/11 1500)  . nitroGLYCERIN 85 mcg/min (11/17/11 0600)    Assessment/Plan:  Chest Pain:  R/O no acute ECG changes.  Not likely ACS.  D/C iv nitro and heparin.  With elevated Cr likely order myovue for Monday CHF:  History of DCM with EF 25-30%  Continue diuretic hyralazine/nitrates  In lieu of ACe  Echo pending Pneumonia:  ? Simplify antibiotic coverage  Charlton Haws 11/17/2011, 9:20 AM

## 2011-11-18 ENCOUNTER — Inpatient Hospital Stay (HOSPITAL_COMMUNITY): Payer: Medicaid Other

## 2011-11-18 DIAGNOSIS — R079 Chest pain, unspecified: Secondary | ICD-10-CM

## 2011-11-18 DIAGNOSIS — I10 Essential (primary) hypertension: Secondary | ICD-10-CM

## 2011-11-18 MED ORDER — REGADENOSON 0.4 MG/5ML IV SOLN
0.4000 mg | Freq: Once | INTRAVENOUS | Status: AC
  Administered 2011-11-18: 0.4 mg via INTRAVENOUS

## 2011-11-18 MED ORDER — TECHNETIUM TC 99M TETROFOSMIN IV KIT
30.0000 | PACK | Freq: Once | INTRAVENOUS | Status: AC | PRN
  Administered 2011-11-18: 30 via INTRAVENOUS

## 2011-11-18 MED ORDER — TECHNETIUM TC 99M TETROFOSMIN IV KIT
10.0000 | PACK | Freq: Once | INTRAVENOUS | Status: AC | PRN
  Administered 2011-11-18: 10 via INTRAVENOUS

## 2011-11-18 NOTE — Progress Notes (Signed)
Subjective: 46 y/o male came to ER for chest pain and admitted to be treated for his chest pain and possible pneumonia.   No complaints today.    Objective: Blood pressure 143/85, pulse 47, temperature 97.3 F (36.3 C), temperature source Oral, resp. rate 10, height 5\' 10"  (1.778 m), weight 88.3 kg (194 lb 10.7 oz), SpO2 100.00%. Weight change:   Intake/Output Summary (Last 24 hours) at 11/18/11 1445 Last data filed at 11/18/11 0800  Gross per 24 hour  Intake  771.5 ml  Output   3225 ml  Net -2453.5 ml    Physical Exam: General appearance: alert, cooperative and no distress Lungs:mild ronchi but no crackles or wheeze.  Heart: regular rate and rhythm, S1, S2 normal, no murmur, click, rub or gallop Abdomen: soft, non-tender; bowel sounds normal; no masses,  no organomegaly Extremities: extremities normal, atraumatic, no cyanosis or edema  Lab Results:  Basename 11/17/11 0323 11/16/11 0757  NA 139 142  K 3.9 3.8  CL 102 104  CO2 27 27  GLUCOSE 101* 105*  BUN 21 20  CREATININE 2.09* 2.12*  CALCIUM 8.3* 8.8  MG -- --  PHOS -- --   No results found for this basename: AST:2,ALT:2,ALKPHOS:2,BILITOT:2,PROT:2,ALBUMIN:2 in the last 72 hours No results found for this basename: LIPASE:2,AMYLASE:2 in the last 72 hours  Basename 11/17/11 0323 11/16/11 0757  WBC 5.3 5.7  NEUTROABS -- 3.3  HGB 13.3 14.0  HCT 40.3 42.2  MCV 88.2 89.0  PLT 185 193    Basename 11/17/11 0945 11/17/11 0317 11/16/11 1723  CKTOTAL 120 139 164  CKMB 1.9 2.0 2.2  CKMBINDEX -- -- --  TROPONINI <0.30 <0.30 <0.30   No components found with this basename: POCBNP:3 No results found for this basename: DDIMER:2 in the last 72 hours No results found for this basename: HGBA1C:2 in the last 72 hours No results found for this basename: CHOL:2,HDL:2,LDLCALC:2,TRIG:2,CHOLHDL:2,LDLDIRECT:2 in the last 72 hours No results found for this basename: TSH,T4TOTAL,FREET3,T3FREE,THYROIDAB in the last 72 hours No  results found for this basename: VITAMINB12:2,FOLATE:2,FERRITIN:2,TIBC:2,IRON:2,RETICCTPCT:2 in the last 72 hours  Micro Results: Recent Results (from the past 240 hour(s))  MRSA PCR SCREENING     Status: Normal   Collection Time   11/16/11  8:13 PM      Component Value Range Status Comment   MRSA by PCR NEGATIVE  NEGATIVE  Final     Studies/Results: Dg Chest 2 View  11/16/2011  *RADIOLOGY REPORT*  Clinical Data: Chest pain worse on the right side.  History of cough.  CHEST - 2 VIEW  Comparison: Chest x-ray 08/13/2011.  Findings: Moderate cardiomegaly is unchanged.  Compared to the prior examination, there is slight increase in prominent interstitial markings throughout the lung bases bilaterally and patchy air space disease in the right base.  No pleural effusions. Pulmonary vasculature is normal.  Tortuosity of the thoracic aorta. Atherosclerosis.  IMPRESSION: 1.  Increased bibasilar interstitial markings with evidence of patchy air space disease at the right base.  Findings could suggest sequela of mild aspiration, or could represent an early bronchopneumonia in the right lower lobe.  Clinical correlation is recommended. 2.  Moderate cardiomegaly is unchanged. 3.  Atherosclerosis.  Original Report Authenticated By: Florencia Reasons, M.D.    Medications: Scheduled Meds:    . amLODipine  10 mg Oral Daily  . aspirin  325 mg Oral Daily  . carvedilol  12.5 mg Oral BID WC  . furosemide  40 mg Oral BID  . hydrALAZINE  100 mg Oral TID  . isosorbide mononitrate  120 mg Oral Daily  . moxifloxacin  400 mg Oral q1800  . regadenoson  0.4 mg Intravenous Once  . simvastatin  5 mg Oral q1800  . sodium chloride  3 mL Intravenous Q12H  . spironolactone  12.5 mg Oral Daily   Continuous Infusions:  PRN Meds:.acetaminophen, acetaminophen, morphine, ondansetron (ZOFRAN) IV, ondansetron, oxyCODONE, senna-docusate, technetium tetrofosmin, technetium tetrofosmin, DISCONTD: sodium chloride, DISCONTD:  sodium chloride  Assessment/Plan:   *Chest pain -Cards recommends stopping Heparin and Nitro which I agree with.  -ECHo:  - Left ventricle: Mild diffuse hypokinesis. Inferobasal worse The cavity size was mildly dilated. Wall thickness was increased in a pattern of severe LVH. The estimated ejection fraction was 45%. - Aortic valve: Trivial regurgitation. - Left atrium: The atrium was mildly dilated. - Atrial septum: No defect or patent foramen ovale was identified.   Positive Stress test Slight reversible defect noted per radiology read. I spoke with Ward Givens. Dr Eden Emms will f/u tomorrow.   CAP (community acquired pneumonia) Cough causing his pain - cont Avelox Cont O2 as needed for hypoxia.    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  Unable to increase BB or CCB due to bradycardia Will increase Hydralazine to 100 Q8 but will likely have to add on another medication tomorrow.  Renal MRI done in 2011 was inadequate in visualizing the Left renal artery- not sure if it was repeated - he has not had a renal duplex in house either.   - needs to get on of the above - will go ahead and order a duplex but this may not be done over the weekend  HYPERLIPIDEMIA Cont statin.   Disposition- cont to monitor on telemetry. Home when cleared by cards.    LOS: 2 days   [facility] [phone] 11/18/2011, 2:45 PM

## 2011-11-18 NOTE — Progress Notes (Signed)
Patient ID: [name], male   DOB: [birth year], 46 y.o.   MRN: [id number] @ Subjective:  No pain this am    Objective:  Filed Vitals:   11/17/11 2312 11/17/11 2315 11/18/11 0333 11/18/11 0822  BP: 151/93 151/93 165/105 182/111  Pulse: 55  56   Temp: 98.1 F (36.7 C)  98.2 F (36.8 C) 97.5 F (36.4 C)  TempSrc: Oral  Oral Oral  Resp:      Height:      Weight:      SpO2: 95%  97%     Intake/Output from previous day:  Intake/Output Summary (Last 24 hours) at 11/18/11 7829 Last data filed at 11/18/11 0800  Gross per 24 hour  Intake 1443.5 ml  Output   3925 ml  Net -2481.5 ml    Physical Exam: Affect appropriate Middle age black male sleepy HEENT: normal Neck supple with no adenopathy JVP normal no bruits no thyromegaly Lungs Rhochi at bases Heart:  S1/S2 no murmur, no rub, gallop or click PMI normal Abdomen: benighn, BS positve, no tenderness, no AAA no bruit.  No HSM or HJR Distal pulses intact with no bruits No edema Neuro non-focal Skin warm and dry No muscular weakness   Lab Results: Basic Metabolic Panel:  Basename 11/17/11 0323 11/16/11 0757  NA 139 142  K 3.9 3.8  CL 102 104  CO2 27 27  GLUCOSE 101* 105*  BUN 21 20  CREATININE 2.09* 2.12*  CALCIUM 8.3* 8.8  MG -- --  PHOS -- --   CBC:  Basename 11/17/11 0323 11/16/11 0757  WBC 5.3 5.7  NEUTROABS -- 3.3  HGB 13.3 14.0  HCT 40.3 42.2  MCV 88.2 89.0  PLT 185 193   Cardiac Enzymes:  Basename 11/17/11 0945 11/17/11 0317 11/16/11 1723  CKTOTAL 120 139 164  CKMB 1.9 2.0 2.2  CKMBINDEX -- -- --  TROPONINI <0.30 <0.30 <0.30    Imaging: No results found.  Cardiac Studies:  ECG: SR LVH no acute changes   Telemetry: NSR no arrhythmia  Echo: EF 45% diffuse hypokinesis  Medications:      . amLODipine  10 mg Oral Daily  . aspirin  325 mg Oral Daily  . carvedilol  12.5 mg Oral BID WC  . furosemide  40 mg Oral BID  . hydrALAZINE  100 mg Oral TID  . isosorbide mononitrate  120  mg Oral Daily  . moxifloxacin  400 mg Oral q1800  . simvastatin  5 mg Oral q1800  . sodium chloride  3 mL Intravenous Q12H  . spironolactone  12.5 mg Oral Daily  . DISCONTD: azithromycin  500 mg Intravenous Q24H  . DISCONTD: cefTRIAXone (ROCEPHIN)  IV  1 g Intravenous Q24H  . DISCONTD: hydrALAZINE  50 mg Oral TID        . DISCONTD: heparin 1,150 Units/hr (11/16/11 1500)  . DISCONTD: nitroGLYCERIN 85 mcg/min (11/17/11 0600)    Assessment/Plan:  Chest Pain:  R/O no acute ECG changes.  Not likely ACS.  D/C iv nitro and heparin.  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 Simplify antibiotic coverage  Charlton Haws 11/18/2011, 9:24 AM

## 2011-11-18 NOTE — Progress Notes (Signed)
*  PRELIMINARY RESULTS* Vascular Ultrasound Renal Artery Duplex has been completed.  Preliminary findings: Bilaterally no evidence of renal artery stenosis.  Farrel Demark RDMS 11/18/2011, 9:41 AM

## 2011-11-18 NOTE — Progress Notes (Signed)
Went to room to do assessment on patient, patient attitude like he does not want to be bothered,  asked pt. To describe the type pain he is having on chest and head, pt. Yell at me to leave him alone, also refuse to take pain med, pt. [name] he is talking the lord. Dr. Butler Denmark notified , will continue to monitor patient.

## 2011-11-19 MED ORDER — AZITHROMYCIN 500 MG PO TABS: 500.0000 mg | ORAL_TABLET | Freq: Every day | ORAL | Status: AC

## 2011-11-19 NOTE — Progress Notes (Signed)
   SUBJECTIVE:  No chest pain unless he is coughing.  No SOB   PHYSICAL EXAM Filed Vitals:   11/18/11 1755 11/18/11 2310 11/19/11 0300 11/19/11 0641  BP: 141/81 151/98 155/96 126/76  Pulse: 63 60 57 57  Temp:  98.2 F (36.8 C) 98 F (36.7 C) 97.8 F (36.6 C)  TempSrc:  Oral Oral Oral  Resp:  20 19 20   Height:    5\' 10"  (1.778 m)  Weight:    94.756 kg (208 lb 14.4 oz)  SpO2:  99% 94% 99%   General:  No distress Lungs:  Clear Heart:  RRR Abdomen:  Positive bowel sounds, no rebound no guarding Extremities:  No edema  LABS: Lab Results  Component Value Date   CKTOTAL 120 11/17/2011   CKMB 1.9 11/17/2011   TROPONINI <0.30 11/17/2011   No results found for this or any previous visit (from the past 24 hour(s)).  Intake/Output Summary (Last 24 hours) at 11/19/11 0845 Last data filed at 11/19/11 0812  Gross per 24 hour  Intake   1320 ml  Output   1175 ml  Net    145 ml    Lexiscan Myoview:  1. Mild reversibility within the the lateral wall and to a lesser  degree in the anteroseptal wall.  2. Left ventricular dilatation stable from rest to stress  3. Global hypokinesia  4. 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  The medications in hospital are somewhat different than what he was taking (or at least I thought he was taking at home.)  OK to continue these meds (most notably he is not now on clonidine).  I am sure that I will need to titrate as an outpatient.  3) CHF:  EF is actually better than it has been in the past.      Rollene Rotunda 11/19/2011 8:45 AM

## 2011-11-19 NOTE — Discharge Summary (Signed)
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  Consultants:  Cardiology  Procedures:  2 view chest x-ray 3/50 = increased bibasilar interstitial markings evidence of patchy airspace disease right base-could represent sequela of mild aspiration or early bronchopneumonia, moderate cardiomegaly, atherosclerosis  Nuclear stress test-mild reversibility within the lateral wall and to a lesse4r degree in the anteroseptal wall, global hypokinesis, left ventricular ejection fraction 34%   Renal artery duplex = no evidence of renal bilateral stenosis  Antibiotics:  Azithromycin 3/15  Ceftriaxone 3/15  Moxifloxacin 3/15-3/20   Hospital Course 46 year old African American male history of CAD and ischemic cardiomyopathy of focal with upper chest pain which was different from his MI type pain. This was associated with cough every skin shows. He was wheezing on admission which resolved with nebulizer treatments. Given he has multiple reasons for chest pain the most serious being cardiogenic he was started on heparin IV nitroglycerin and aspirin and serial cardiac enzymes were done which were negative. This is thought to be possibly related to his elevated blood pressure. His antibiotic regimen was simplified to azithromycin to complete 3/20. His intractably high blood pressure despite him taking therapy was noted to have decreased in the hospital. I suspect an element of noncompliance. He was sent home on similar medications what he's been on the past. He complained of no chest pain on day of  discharge. He also had a workup for renal artery stenosis with a negative renal duplex. His nuclear stress test done 3/17 did not show any specific wall motion abnormality but did show global hypokinesis. I discussed his case with Dr. Angelina Sheriff of cardiology who cleared him for discharge and patient should follow up closely with him as well as with his primary care physician in addition to completing a full course medications. Patient verbalized understanding of   Subjective  Feels fine, no chest pain unless he coughs. No radiating left arm pain no nausea no vomiting Tolerating by mouth quite well    Procedures Performed and pertinent labs: Dg Chest 2 View  11/16/2011  *RADIOLOGY REPORT*  Clinical Data: Chest pain worse on the right side.  History of cough.  CHEST - 2 VIEW  Comparison: Chest x-ray 08/13/2011.  Findings: Moderate cardiomegaly is unchanged.  Compared to the prior examination, there is slight increase in prominent interstitial markings throughout the lung bases bilaterally and patchy air space disease in the right base.  No pleural effusions. Pulmonary vasculature is normal.  Tortuosity of the thoracic aorta. Atherosclerosis.  IMPRESSION: 1.  Increased bibasilar interstitial markings with evidence of patchy air space disease at the right base.  Findings could suggest sequela of mild aspiration, or could represent an early bronchopneumonia in the right lower lobe.  Clinical correlation is recommended. 2.  Moderate cardiomegaly is unchanged. 3.  Atherosclerosis.  Original Report Authenticated By: Florencia Reasons, M.D.   Nm Myocar Multi W/spect W/wall Motion / Ef  11/18/2011  *RADIOLOGY REPORT*  Clinical Data:  46 year old male with chest pain.  Diabetic cardiac myopathy.  MYOCARDIAL IMAGING WITH SPECT (REST AND PHARMACOLOGIC-STRESS) GATED LEFT VENTRICULAR WALL MOTION STUDY LEFT VENTRICULAR EJECTION FRACTION  Technique:  Resting myocardial SPECT imaging  was initially performed after  intravenous administration of radiopharmaceutical. Myocardial SPECT was subsequently performed after additional radiopharmaceutical injection during pharmacologic-stress supervised by the Cardiology staff.  Quantitative gated imaging was also performed to evaluate left ventricular wall motion, and estimate left ventricular ejection fraction.  Radiopharmaceutical:  Tc-31m Myoview at rest and during stress.  Comparison: none  Findings:  Technique: Study is adequate.  Perfusion: There are decreased counts in the mid and basilar segments of the lateral wall with moderate improvement from stress to rest.  A second focus of decreased counts within the apical segment of the anteroseptal wall with mild improvement from stress rest but to a lesser degree in the lateral wall abnormality.  Left ventricle is dilated on rest and stress to the same degree.  Wall motion:  Global hypokinesia.  Left ventricular ejection fraction:  Calculated left ventricular ejection fraction =  34%  IMPRESSION:  1. Mild reversibility within the  the lateral wall and to a lesser degree in the anteroseptal wall.  2.  Left ventricular dilatation stable from rest  to stress  3.  Global hypokinesia  4.  Left ventricular ejection fraction equal 34%  Findings confirmed to Christie Beckers on 11/18/2011 at 12:55 p.m.  Original Report Authenticated By: Genevive Bi, M.D.    Discharge Vitals & PE:  BP 126/76  Pulse 57  Temp(Src) 97.8 F (36.6 C) (Oral)  Resp 20  Ht 5\' 10"  (1.778 m)  Wt 94.756 kg (208 lb 14.4 oz)  BMI 29.97 kg/m2  SpO2 99% Alert, oriented Clinical clear no added sound S1-S2 no murmur rub or gallop some ventricular bigeminy on telemetry nonsustained. Heart rate in 70s Abdomen soft nontender nondistended no extremities nondistended and no pitting edema  Discharge Labs: No results found for this or any previous visit (from the past 24 hour(s)).  Disposition and follow-up:   [name] was discharged from in  good condition.    Follow-up Appointments:  Follow-up Information    Follow up with Josue Hector, MD .          Discharge Medications: Medication List  As of 11/19/2011 11:19 AM   TAKE these medications         amLODipine 10 MG tablet   Commonly known as: NORVASC   Take 10 mg by mouth every morning.      aspirin 325 MG tablet   Take 325 mg by mouth daily.      azithromycin 500 MG tablet   Commonly known as: ZITHROMAX   Take 1 tablet (500 mg total) by mouth daily.      carvedilol 12.5 MG tablet   Commonly known as: COREG   Take 12.5 mg by mouth 2 (two) times daily with a meal.      furosemide 40 MG tablet   Commonly known as: LASIX   Take 40 mg by mouth 2 (two) times daily.      hydrALAZINE 50 MG tablet   Commonly known as: APRESOLINE   Take 50 mg by mouth 3 (three) times daily.      isosorbide mononitrate 120 MG 24 hr tablet   Commonly known as: IMDUR   Take 120 mg by mouth daily.      nitroGLYCERIN 0.4 MG SL tablet   Commonly known as: NITROSTAT   Place 0.4 mg under the tongue every 5 (five) minutes x 3 doses as needed. For chest pain      pravastatin 40 MG tablet   Commonly known as: PRAVACHOL  Take 40 mg by mouth every evening.      spironolactone 25 MG tablet   Commonly known as: ALDACTONE   Take 12.5 mg by mouth daily.           Medications Discontinued During This Encounter  Medication Reason  . isosorbide mononitrate (IMDUR) 120 MG 24 hr tablet   . carvedilol (COREG) 12.5 MG tablet   . aspirin EC tablet 325 mg Duplicate  . heparin ADULT infusion 100 units/mL (25000 units/250 mL)   . nitroGLYCERIN 0.2 mg/mL in dextrose 5 % infusion   . cefTRIAXone (ROCEPHIN) 1 g in dextrose 5 % 50 mL IVPB   . azithromycin (ZITHROMAX) 500 mg in dextrose 5 % 250 mL IVPB   . hydrALAZINE (APRESOLINE) tablet 50 mg   . 0.9 %  sodium chloride infusion   . sodium chloride 0.9 % injection 3 mL     Signed: Alecxis [name],JAI 11/19/2011, 11:19 AM

## 2011-11-19 NOTE — Progress Notes (Signed)
D/c instructions reviewed with pt and family. Pt verbalized understanding of instructions. Pt given script and copy of instructions. Pt d/c'd with belongings with family via ambulation, pt did not want to ride in wheelchair, steady gait. Pt instructed to walk slowly take his time, pt and family verbalized understanding.

## 2011-12-28 ENCOUNTER — Other Ambulatory Visit: Payer: Self-pay | Admitting: Cardiology

## 2011-12-28 NOTE — Telephone Encounter (Signed)
..   Requested Prescriptions   Pending Prescriptions Disp Refills  . pravastatin (PRAVACHOL) 40 MG tablet [Pharmacy Med Name: PRAVASTATIN SODIUM 40 MG TAB] 90 tablet 3    Sig: TAKE 1 TABLET BY MOUTH EVERY EVENING

## 2012-02-26 ENCOUNTER — Other Ambulatory Visit: Payer: Self-pay | Admitting: Cardiology

## 2012-03-03 ENCOUNTER — Encounter (HOSPITAL_COMMUNITY): Payer: Self-pay

## 2012-03-03 ENCOUNTER — Telehealth: Payer: Self-pay | Admitting: Cardiology

## 2012-03-03 ENCOUNTER — Emergency Department (HOSPITAL_COMMUNITY): Payer: Medicaid Other

## 2012-03-03 ENCOUNTER — Inpatient Hospital Stay (HOSPITAL_COMMUNITY)
Admission: EM | Admit: 2012-03-03 | Discharge: 2012-03-06 | DRG: 303 | Disposition: A | Discharge status: Home / Self Care | Payer: Medicaid Other | Attending: Cardiology | Admitting: Cardiology

## 2012-03-03 DIAGNOSIS — I10 Essential (primary) hypertension: Secondary | ICD-10-CM | POA: Diagnosis present

## 2012-03-03 DIAGNOSIS — R079 Chest pain, unspecified: Secondary | ICD-10-CM

## 2012-03-03 DIAGNOSIS — J40 Bronchitis, not specified as acute or chronic: Secondary | ICD-10-CM | POA: Diagnosis present

## 2012-03-03 DIAGNOSIS — I509 Heart failure, unspecified: Secondary | ICD-10-CM | POA: Diagnosis present

## 2012-03-03 DIAGNOSIS — E785 Hyperlipidemia, unspecified: Secondary | ICD-10-CM | POA: Diagnosis present

## 2012-03-03 DIAGNOSIS — R001 Bradycardia, unspecified: Secondary | ICD-10-CM

## 2012-03-03 DIAGNOSIS — I252 Old myocardial infarction: Secondary | ICD-10-CM

## 2012-03-03 DIAGNOSIS — I2589 Other forms of chronic ischemic heart disease: Secondary | ICD-10-CM | POA: Diagnosis present

## 2012-03-03 DIAGNOSIS — I440 Atrioventricular block, first degree: Secondary | ICD-10-CM | POA: Diagnosis present

## 2012-03-03 DIAGNOSIS — D649 Anemia, unspecified: Secondary | ICD-10-CM | POA: Diagnosis present

## 2012-03-03 DIAGNOSIS — N183 Chronic kidney disease, stage 3 unspecified: Secondary | ICD-10-CM | POA: Diagnosis present

## 2012-03-03 DIAGNOSIS — Z7982 Long term (current) use of aspirin: Secondary | ICD-10-CM

## 2012-03-03 DIAGNOSIS — I200 Unstable angina: Secondary | ICD-10-CM

## 2012-03-03 DIAGNOSIS — Z8249 Family history of ischemic heart disease and other diseases of the circulatory system: Secondary | ICD-10-CM

## 2012-03-03 DIAGNOSIS — I498 Other specified cardiac arrhythmias: Secondary | ICD-10-CM | POA: Diagnosis present

## 2012-03-03 DIAGNOSIS — I129 Hypertensive chronic kidney disease with stage 1 through stage 4 chronic kidney disease, or unspecified chronic kidney disease: Secondary | ICD-10-CM | POA: Diagnosis present

## 2012-03-03 DIAGNOSIS — Z87891 Personal history of nicotine dependence: Secondary | ICD-10-CM

## 2012-03-03 DIAGNOSIS — I5022 Chronic systolic (congestive) heart failure: Secondary | ICD-10-CM | POA: Diagnosis present

## 2012-03-03 DIAGNOSIS — I2582 Chronic total occlusion of coronary artery: Secondary | ICD-10-CM | POA: Diagnosis present

## 2012-03-03 DIAGNOSIS — I2510 Atherosclerotic heart disease of native coronary artery without angina pectoris: Secondary | ICD-10-CM | POA: Diagnosis present

## 2012-03-03 LAB — BASIC METABOLIC PANEL
GFR calc Af Amer: 31 mL/min — ABNORMAL LOW (ref >90)
GFR calc non Af Amer: 27 mL/min — ABNORMAL LOW (ref >90)
Potassium: 4 mEq/L (ref 3.5–5.1)
Sodium: 141 mEq/L (ref 135–145)

## 2012-03-03 LAB — POCT I-STAT TROPONIN I: Troponin i, poc: 0.04 ng/mL (ref 0.00–0.08)

## 2012-03-03 LAB — CBC WITH DIFFERENTIAL/PLATELET
Basophils Relative: 1 % (ref 0–1)
Eosinophils Absolute: 0.4 10*3/uL (ref 0.0–0.7)
MCH: 29 pg (ref 26.0–34.0)
MCHC: 33 g/dL (ref 30.0–36.0)
Neutro Abs: 5.2 10*3/uL (ref 1.7–7.7)
Neutrophils Relative %: 62 % (ref 43–77)
Platelets: 269 10*3/uL (ref 150–400)
RBC: 4.59 MIL/uL (ref 4.22–5.81)

## 2012-03-03 MED ORDER — ASPIRIN EC 81 MG PO TBEC
81.0000 mg | DELAYED_RELEASE_TABLET | Freq: Every day | ORAL | Status: DC
  Administered 2012-03-04 – 2012-03-06 (×3): 81 mg via ORAL
  Filled 2012-03-03 (×3): qty 1

## 2012-03-03 MED ORDER — SODIUM CHLORIDE 0.9 % IJ SOLN: 3.0000 mL | INTRAMUSCULAR | Status: DC | PRN

## 2012-03-03 MED ORDER — SODIUM CHLORIDE 0.9 % IV SOLN: 250.0000 mL | INTRAVENOUS | Status: DC | PRN

## 2012-03-03 MED ORDER — ONDANSETRON HCL 4 MG/2ML IJ SOLN: 4.0000 mg | Freq: Four times a day (QID) | INTRAMUSCULAR | Status: DC | PRN

## 2012-03-03 MED ORDER — MORPHINE SULFATE 4 MG/ML IJ SOLN
4.0000 mg | Freq: Once | INTRAMUSCULAR | Status: AC
  Administered 2012-03-03: 4 mg via INTRAVENOUS
  Filled 2012-03-03: qty 1

## 2012-03-03 MED ORDER — CARVEDILOL 25 MG PO TABS
25.0000 mg | ORAL_TABLET | Freq: Two times a day (BID) | ORAL | Status: DC
  Filled 2012-03-03 (×3): qty 1

## 2012-03-03 MED ORDER — NITROGLYCERIN 0.4 MG SL SUBL
0.4000 mg | SUBLINGUAL_TABLET | SUBLINGUAL | Status: DC | PRN
  Administered 2012-03-03 – 2012-03-04 (×5): 0.4 mg via SUBLINGUAL
  Filled 2012-03-03: qty 75
  Filled 2012-03-03: qty 25

## 2012-03-03 MED ORDER — ONDANSETRON HCL 4 MG/2ML IJ SOLN
4.0000 mg | Freq: Once | INTRAMUSCULAR | Status: AC
  Administered 2012-03-03: 4 mg via INTRAVENOUS
  Filled 2012-03-03: qty 2

## 2012-03-03 MED ORDER — ACETAMINOPHEN 325 MG PO TABS
650.0000 mg | ORAL_TABLET | ORAL | Status: DC | PRN
  Administered 2012-03-03: 650 mg via ORAL
  Filled 2012-03-03: qty 2

## 2012-03-03 MED ORDER — SODIUM CHLORIDE 0.9 % IV SOLN
INTRAVENOUS | Status: DC
  Administered 2012-03-03: via INTRAVENOUS
  Administered 2012-03-04: 1000 mL via INTRAVENOUS

## 2012-03-03 MED ORDER — ISOSORBIDE MONONITRATE ER 60 MG PO TB24
120.0000 mg | ORAL_TABLET | Freq: Every day | ORAL | Status: DC
  Administered 2012-03-04 – 2012-03-06 (×3): 120 mg via ORAL
  Filled 2012-03-03 (×3): qty 2

## 2012-03-03 MED ORDER — SODIUM CHLORIDE 0.9 % IJ SOLN
3.0000 mL | Freq: Two times a day (BID) | INTRAMUSCULAR | Status: DC
  Administered 2012-03-03 – 2012-03-05 (×2): 3 mL via INTRAVENOUS

## 2012-03-03 MED ORDER — SIMVASTATIN 20 MG PO TABS
20.0000 mg | ORAL_TABLET | Freq: Every day | ORAL | Status: DC
  Administered 2012-03-04 – 2012-03-05 (×2): 20 mg via ORAL
  Filled 2012-03-03 (×3): qty 1

## 2012-03-03 MED ORDER — ENOXAPARIN SODIUM 30 MG/0.3ML ~~LOC~~ SOLN
30.0000 mg | Freq: Every day | SUBCUTANEOUS | Status: DC
  Administered 2012-03-04 – 2012-03-05 (×2): 30 mg via SUBCUTANEOUS
  Filled 2012-03-03 (×3): qty 0.3

## 2012-03-03 MED ORDER — ALPRAZOLAM 0.25 MG PO TABS: 0.2500 mg | ORAL_TABLET | Freq: Two times a day (BID) | ORAL | Status: DC | PRN

## 2012-03-03 MED ORDER — ZOLPIDEM TARTRATE 5 MG PO TABS: 5.0000 mg | ORAL_TABLET | Freq: Every evening | ORAL | Status: DC | PRN

## 2012-03-03 MED ORDER — AMLODIPINE BESYLATE 10 MG PO TABS
10.0000 mg | ORAL_TABLET | Freq: Every day | ORAL | Status: DC
  Administered 2012-03-04 – 2012-03-06 (×3): 10 mg via ORAL
  Filled 2012-03-03 (×3): qty 1

## 2012-03-03 MED ORDER — SODIUM CHLORIDE 0.9 % IV BOLUS (SEPSIS)
500.0000 mL | Freq: Once | INTRAVENOUS | Status: AC
  Administered 2012-03-03: 500 mL via INTRAVENOUS

## 2012-03-03 NOTE — ED Provider Notes (Signed)
Patient remains stable but hr 40s.  [facility] at bedside. 7:25 PM   Hilario Quarry, MD 03/03/12 1925

## 2012-03-03 NOTE — H&P (Signed)
History and Physical   Patient ID: [name] MRN: [id number], DOB/AGE: 45-May-1967   Admit date: 03/03/2012 Date of Consult: 03/03/2012   Primary Physician: Josue Hector, MD Primary Cardiologist: Rollene Rotunda, MD  Pt. Profile: [name] is a 46yo AA male with PMHx significant for CAD (see below), ischemic cardiomyopathy (EF 25%, with multiple WMAs; LVEF 65% in 2012), HTN, HL and CKD stage III  (baseline Cr 2.3) who presents to [facility] ED today with c/o chest pain.   PAST CARDIAC HISTORY (most recent)  Cardiac cath 03/19/11: total RCA occlusion with left-to-right collaterals supplying the distal RCA branches, mild diffuse nonobstructive disease of LAD and LCx. Medically managed.   2D echo 11/17/11:   - Left ventricle: Mild diffuse hypokinesis. Inferobasal worse The cavity size was mildly dilated. Wall thickness was increased in a pattern of severe LVH. The estimated ejection fraction was 45%. - Aortic valve: Trivial regurgitation. - Left atrium: The atrium was mildly dilated. - Atrial septum: No defect or patent foramen ovale was identified.  Myoview 11/18/11:   1. Mild reversibility within the the lateral wall and to a lesser  degree in the anteroseptal wall.  2. Left ventricular dilatation stable from rest to stress  3. Global hypokinesia  4. Left ventricular ejection fraction equal 34%   HPI:   The patient had last seen Dr. Antoine Poche in 10/31/11. At that time, the patient continued to complain of reproducible chest pain on exertion. Imdur was increased. The plan was made to continue risk reduction. He was compensated from a heart failure standpoint. He was noted to have a history of syncope with an unclear etiology, however was felt to not be cardiac mediated. This was deferred to his PCP to suggest possible neurology work-up.  He reports experiencing intermittent bandlike sharp chest pain lasting 10-15 minutes without radiation rated at a 8/10 for the last  several days. He reports worsening of these episodes last night. He states NTG usually eases, the pain, but has not provided relief today. He reports associated sob, lightheadedness, palpitations and diaphoresis. This is reminiscent of his prior MIs. Not related to meals. He endorses baseline orthopnea. Denies new swelling, PND, nausea, vomiting, fevers, chills, cough, urinary or BM changes. No extended travel or sick contacts. He reports medication compliance. The patient called the office today complaining of intermittent chest pain over the weekend, rated at a 8/10, unrelieved with NTG SL PRN. EMS was called and transported him to the [facility] ED.  Upon ED arrival, EKG reveals nonspecific V6 ST changes. Q waves V5 ,V6. TWIs I, aVL. POC TnI WNL. BMET reveals elevated Cr at 2.72. CBC WNL. CXR reveals cardiomegaly with pulmonary vascular congestion. Bronchitic changes with minimal left base atelectasis or scarring. Chronic opacity of R base (atelectasis over infiltrate favored).   Problem List: Past Medical History  Diagnosis Date  . CAD (coronary artery disease) 12/2006    NSTEMI 2008 in the setting of profound epistaxis and anemia, last cath 2012 03/2011 (med rx)  . Hypertension   . Hyperlipidemia   . Chronic kidney disease     Average creatine of about 2.3  . Ischemic cardiomyopathy     Prior EF 25%, apical, posterior akinesis, inferior akinesis, moderate LVH, mild MR, mild decreased RV function,  65% EF 2012 echo.  . Anemia     Past Surgical History  Procedure Date  . Cardiac catheterization 2012     Allergies: No Known Allergies  Home Medications: Prior to Admission medications   Medication  Sig Start Date End Date Taking? Authorizing Provider  amLODipine (NORVASC) 10 MG tablet Take 10 mg by mouth every morning.  05/04/11  Yes Rollene Rotunda, MD  aspirin EC 81 MG tablet Take 81 mg by mouth daily.   Yes Historical Provider, MD  carvedilol (COREG) 25 MG tablet Take 25 mg by mouth 2 (two) times  daily with a meal.   Yes Historical Provider, MD  furosemide (LASIX) 40 MG tablet Take 40 mg by mouth 2 (two) times daily.   Yes Historical Provider, MD  isosorbide mononitrate (IMDUR) 120 MG 24 hr tablet Take 120 mg by mouth daily. 10/31/11  Yes Rollene Rotunda, MD  nitroGLYCERIN (NITROSTAT) 0.4 MG SL tablet Place 0.4 mg under the tongue every 5 (five) minutes x 3 doses as needed. For chest pain 05/02/11 05/01/12 Yes Rollene Rotunda, MD  pravastatin (PRAVACHOL) 40 MG tablet Take 40 mg by mouth every evening.   Yes Historical Provider, MD  spironolactone (ALDACTONE) 25 MG tablet Take 12.5 mg by mouth daily.   05/04/11  Yes Rollene Rotunda, MD    Inpatient Medications:     .  morphine injection  4 mg Intravenous Once  . ondansetron  4 mg Intravenous Once  . sodium chloride  500 mL Intravenous Once    (Not in a hospital admission)  Family History  Problem Relation Age of Onset  . Hypertension Mother     deceased  . Diabetes Mother     deceased  . Heart disease Mother     deceased  . Heart attack Father     at age 67, deceased     History   Social History  . Marital Status: Legally Separated    Spouse Name: N/A    Number of Children: 6  . Years of Education: N/A   Occupational History  . Unemployed    Social History Main Topics  . Smoking status: Former Smoker -- 1.0 packs/day for 10 years    Types: Cigarettes    Quit date: 11/16/2006  . Smokeless tobacco: Never Used  . Alcohol Use: No  . Drug Use: No  . Sexually Active: Not on file   Other Topics Concern  . Not on file   Social History Narrative   The patient lives in [place] with his sister. He is legally separated and has 6 healthy children who do not live with him. He is not employed and is trying to get disability. He walks 1-2 miles about 2X per week if he is feeling up to it, but c/o SOB with this activity.      Review of Systems: General: negative for chills, fever, night sweats or weight changes.    Cardiovascular: positive for chest pain, dyspnea on exertion, orthopnea, palpitations, paroxysmal nocturnal dyspnea or shortness of breath; denies edema Dermatological: negative for rash Respiratory: negative for cough or wheezing Urologic: negative for hematuria Abdominal: negative for nausea, vomiting, diarrhea, bright red blood per rectum, melena, or hematemesis Neurologic:  negative for visual changes, syncope All other systems reviewed and are otherwise negative except as noted above.  Physical Exam: Blood pressure 107/76, pulse 43, temperature 98.2 F (36.8 C), resp. rate 20, SpO2 96.00%.    General: Appears older than stated age, well developed, well nourished, in no acute distress. Head: Normocephalic, atraumatic, sclera non-icteric, no xanthomas, nares are without discharge.  Neck: Negative for carotid bruits. JVD not elevated. Lungs:  Decreased BS RLL, bibasilar rales noted. No wheezes or rhonchi. Breathing is unlabored. Heart: RRR  with S1 S2. No murmurs, rubs, or gallops appreciated. Abdomen: Soft, non-tender, non-distended with normoactive bowel sounds. No hepatomegaly. No rebound/guarding. No obvious abdominal masses. Msk:  Strength and tone appears normal for age. Extremities: No clubbing, cyanosis or edema.  Distal pedal pulses are 2+ and equal bilaterally. Neuro: Alert and oriented X 3. Moves all extremities spontaneously. Psych:  Responds to questions appropriately with a normal affect.  Labs: Recent Labs  Basename 03/03/12 1339   WBC 8.2   HGB 13.3   HCT 40.3   MCV 87.8   PLT 269   Lab 03/03/12 1339  NA 141  K 4.0  CL 103  CO2 27  BUN 35*  CREATININE 2.72*  CALCIUM 9.2  PROT --  BILITOT --  ALKPHOS --  ALT --  AST --  AMYLASE --  LIPASE --  GLUCOSE 92   Radiology/Studies: Dg Chest 2 View  03/03/2012  *RADIOLOGY REPORT*  Clinical Data: Mid chest pain, headaches hypertension  CHEST - 2 VIEW  Comparison: 11/16/2011  Findings: Enlargement of cardiac  silhouette. Slight pulmonary vascular congestion. Calcified tortuous aorta. Minimal atelectasis versus scarring lower left chest. Opacity right base may represent atelectasis or infiltrate, slightly improved. Minimal peribronchial thickening. No pleural effusion or pneumothorax. Bones unremarkable.  IMPRESSION: Enlargement of cardiac silhouette with pulmonary vascular congestion. Bronchitic changes with minimal left base atelectasis or scarring. Chronic opacity at the right base, favor atelectasis over infiltrate, improved versus previous study.  Original Report Authenticated By: Lollie Marrow, M.D.   EKG 03/03/12 1636: EKG reveals sinus bradycardia, 40 bpm, nonspecific V6 ST changes. Q waves V5 ,V6. TWIs I, aVL (unchanged from prior tracings)  ASSESSMENT:   1. CAD/unstable angina 2. Ischemic cardiomyopathy 3. Sinus bradycardia 4. CKD, stage III 5. HTN 6. HL   DISCUSSION/PLAN:  The patient has a history of CAD and significant cardiac RFs. He reports a HPI consistent with typical and atypical features. His angina had been fairly well-controlled since seeing Dr. Antoine Poche in 10/2011 and being optimized with his medical regimen. He has had a Lexiscan Myoview in 11/2011 revealing lateral and anteroseptal wall ischemia. LVEF 30-45% on LM and echo. Symptoms worsened over the weekend. Concerning for unstable angina. No objective evidence of ischemia today, but given the patient's recent studies, history of CAD and cardiac RFs. Will admit for ACS and consider re-cathing tomorrow. Hold Lasix, aldactone, cycle cardiac biomarkers. Gently hydrate. Will defer further recs by [facility] on rounding tomorrow AM. Consider down-titrating BB in the setting of sinus bradycardia.    Signed, R. Hurman Horn, PA-C 03/03/2012, 6:50 PM    Attending note:  Patient seen and examined. Reviewed records and database as recorded by Mr. Arguello. He is a patient of Dr. Antoine Poche, last seen in the office back in February with known  history of CAD, occluded RCA with left collaterals, and evidence of progressive cardiomyopathy, LVEF reduced from 65% last year, down to 35-45% based on most recent testing. He also has stage III CKD with baseline creatinine around 2.0 2.3.  He presents now complaining of progressive chest pain symptoms, typical and atypical features, also associated with dyspnea on exertion and orthopnea. He reports compliance with his medications which are outlined above. His initial cardiac markers are normal with chronically abnormal ECG showing LVH and nonspecific ST-T changes.  On examination he denies active chest pain, most recent blood pressure 133/99, heart rate is been in the 40s to 50s in sinus rhythm. Lungs are clear with diminished breath sounds, cardiac exam  with regular rate and rhythm, indistinct PMI, no peripheral edema.  Myoview from this March revealed mild ischemia within the lateral wall, also to some degree noted in the anteroseptal wall, global hypokinesis, LVEF was 34%.  Plan after reviewing situation with him, is to admit for further evaluation to the telemetry unit, hold Lasix and Aldactone with gentle hydration, followup BMET. Otherwise continue his current cardiac regimen, although Coreg may need to be downtitrated if he remains significantly bradycardic. Cycle full set of cardiac markers. Dr. Antoine Poche can then determine whether proceeding to a relook angiogram with right heart catheterization can be considered, depending on renal function.  Jonelle Sidle, M.D., F.A.C.C.

## 2012-03-03 NOTE — ED Provider Notes (Signed)
History     CSN: [id number]  Arrival date & time 03/03/12  1204   First MD Initiated Contact with Patient 03/03/12 1255      Chief Complaint  Patient presents with  . Chest Pain    Pt complaining of CP starting this morning.  Pt went to Md office to set up appt and was advised to come to ED for eval.  Pt also complaining of cough and SOB for two days.  Pt also states feeling dizzy today.      (Consider location/radiation/quality/duration/timing/severity/associated sxs/prior treatment) The history is provided by the patient and medical records.    Patient has history of coronary artery disease with NSTEMI in 2008 is followed by Dr. Antoine Poche, cardiology, presents to emergency department by EMS complaining of chest pain. Patient states that he's had long-standing history of having intermittent chest pain that he treats with nitroglycerin however over the last week has been taking increasing doses of nitroglycerin due to increasing chest pain. Patient states chest pain is on the left side of his chest associated with feeling hot and cold as well as shortness of breath. He denies radiation of pain. Patient states that he had decided last night that if he woke again with severe chest pain that he would go to his primary care provider to schedule an appointment. Patient states that once again he woke with chest pain and took nitroglycerin without relief of pain and therefore drove to his doctor's office. Patient states that the physician's office he was found to have an elevated blood pressure and due to concern of chest pain in setting of hx of MI was sent to the emergency department EMS. Patient states he received aspirin and additional nitroglycerin in route with mild relief the chest pain but states he still having ongoing mild chest discomfort. Patient denies aggravating or alleviating factors. Patient states he can have chest pain at rest or with exertion. Denies fevers, cough, hemoptysis, abdominal  pain, vomiting, diarrhea, lower extremity pain or swelling.   Past Medical History  Diagnosis Date  . CAD (coronary artery disease) 12/2006    NSTEMI 2008 in the setting of profound epistaxis and anemia, last cath 2012 03/2011 (med rx)  . Hypertension   . Hyperlipidemia   . Chronic kidney disease     Average creatine of about 2.3  . Ischemic cardiomyopathy     Prior EF 25%, apical, posterior akinesis, inferior akinesis, moderate LVH, mild MR, mild decreased RV function,  65% EF 2012 echo.  . Anemia     Past Surgical History  Procedure Date  . Cardiac catheterization 2012    Family History  Problem Relation Age of Onset  . Hypertension Mother     deceased  . Diabetes Mother     deceased  . Heart disease Mother     deceased  . Heart attack Father     at age 35, deceased    History  Substance Use Topics  . Smoking status: Former Smoker -- 1.0 packs/day for 10 years    Types: Cigarettes    Quit date: 11/16/2006  . Smokeless tobacco: Never Used  . Alcohol Use: No      Review of Systems  All other systems reviewed and are negative.    Allergies  Review of patient's allergies indicates no known allergies.  Home Medications   Current Outpatient Rx  Name Route Sig Dispense Refill  . AMLODIPINE BESYLATE 10 MG PO TABS Oral Take 10 mg by mouth every  morning.     . ASPIRIN EC 81 MG PO TBEC Oral Take 81 mg by mouth daily.    Marland Kitchen CARVEDILOL 25 MG PO TABS Oral Take 25 mg by mouth 2 (two) times daily with a meal.    . FUROSEMIDE 40 MG PO TABS Oral Take 40 mg by mouth 2 (two) times daily.    . ISOSORBIDE MONONITRATE ER 120 MG PO TB24 Oral Take 120 mg by mouth daily.    Marland Kitchen NITROGLYCERIN 0.4 MG SL SUBL Sublingual Place 0.4 mg under the tongue every 5 (five) minutes x 3 doses as needed. For chest pain    . PRAVASTATIN SODIUM 40 MG PO TABS Oral Take 40 mg by mouth every evening.    Marland Kitchen SPIRONOLACTONE 25 MG PO TABS Oral Take 12.5 mg by mouth daily.        Pulse 55  Temp 98.2 F  (36.8 C)  Resp 18  SpO2 96%  Physical Exam  Nursing note and vitals reviewed. Constitutional: He is oriented to person, place, and time. He appears well-developed and well-nourished. No distress.       obese  HENT:  Head: Normocephalic and atraumatic.  Eyes: Conjunctivae are normal.  Neck: Normal range of motion. Neck supple.  Cardiovascular: Regular rhythm, normal heart sounds and intact distal pulses.  Bradycardia present.  Exam reveals no gallop and no friction rub.   No murmur heard. Pulmonary/Chest: Effort normal and breath sounds normal. No respiratory distress. He has no wheezes. He has no rales. He exhibits no tenderness.  Abdominal: Soft. Bowel sounds are normal. He exhibits no distension and no mass. There is no tenderness. There is no rebound and no guarding.  Musculoskeletal: Normal range of motion. He exhibits no edema and no tenderness.  Neurological: He is alert and oriented to person, place, and time.  Skin: Skin is warm and dry. No rash noted. He is not diaphoretic. No erythema.  Psychiatric: He has a normal mood and affect.    ED Course  Procedures (including critical care time)  Labs Reviewed  BASIC METABOLIC PANEL - Abnormal; Notable for the following:    BUN 35 (*)     Creatinine, Ser 2.72 (*)     GFR calc non Af Amer 27 (*)     GFR calc Af Amer 31 (*)     All other components within normal limits  CBC WITH DIFFERENTIAL  POCT I-STAT TROPONIN I   Dg Chest 2 View  03/03/2012  *RADIOLOGY REPORT*  Clinical Data: Mid chest pain, headaches hypertension  CHEST - 2 VIEW  Comparison: 11/16/2011  Findings: Enlargement of cardiac silhouette. Slight pulmonary vascular congestion. Calcified tortuous aorta. Minimal atelectasis versus scarring lower left chest. Opacity right base may represent atelectasis or infiltrate, slightly improved. Minimal peribronchial thickening. No pleural effusion or pneumothorax. Bones unremarkable.  IMPRESSION: Enlargement of cardiac silhouette  with pulmonary vascular congestion. Bronchitic changes with minimal left base atelectasis or scarring. Chronic opacity at the right base, favor atelectasis over infiltrate, improved versus previous study.  Original Report Authenticated By: Lollie Marrow, M.D.     No diagnosis found.    MDM  No active CP currently. [facility] to see with dispo pending their evaluation. Sign out given to Dr Rosalia Hammers.         [place], [place] 03/03/12 1512

## 2012-03-03 NOTE — ED Notes (Signed)
Patient transported to X-ray 

## 2012-03-03 NOTE — ED Notes (Signed)
Pt complaining of feeling lightheaded.  Performed EKG.  Gave to MD Ray.  Pt has HR from 37-44.  MD aware.  Pt alert and oriented x4.

## 2012-03-03 NOTE — ED Notes (Signed)
Spoke to [employer] about pt stating he had homicidal thoughts off and on for past few weeks.  PA states no need for sitter at this time.  CN aware.

## 2012-03-03 NOTE — ED Provider Notes (Signed)
Date: 03/03/2012  Rate: 54  Rhythm: sinus bradycardia  QRS Axis: left  Intervals: normal  ST/T Wave abnormalities: nonspecific T wave changes  Conduction Disutrbances:none  Narrative Interpretation: Sinus bradycardia, left ventricular hypertrophy, left atrial hypertrophy, nonspecific T wave flattening in the lateral and anterolateral leads. When compared with ECG of 11/17/2011, QT interval.  Old EKG Reviewed: changes noted    Dione Booze, MD 03/04/12 [phone]

## 2012-03-03 NOTE — ED Notes (Signed)
MD at bedside. Cardiology 

## 2012-03-03 NOTE — Telephone Encounter (Signed)
Patient called stated he has been having chest pain off and on all weekend.Stated he has been taking a lot of ntg with relief.States is having chest pain now.States pain is a # 8.Patient was advised to go to [facility] ER.Rosann Auerbach was called.

## 2012-03-03 NOTE — ED Notes (Signed)
Patient transported from X-ray 

## 2012-03-03 NOTE — ED Notes (Signed)
Pt having dinner, pt currently having 3/10 CP.

## 2012-03-03 NOTE — ED Notes (Signed)
Pt resting in stretcher in NAD, respirations even and unlabored.  MD notified that cardiology MD has not been in to see pt.

## 2012-03-04 ENCOUNTER — Encounter (HOSPITAL_COMMUNITY): Payer: Self-pay | Admitting: *Deleted

## 2012-03-04 DIAGNOSIS — R079 Chest pain, unspecified: Secondary | ICD-10-CM

## 2012-03-04 LAB — BASIC METABOLIC PANEL
BUN: 38 mg/dL — ABNORMAL HIGH (ref 6–23)
Chloride: 104 mEq/L (ref 96–112)
GFR calc non Af Amer: 26 mL/min — ABNORMAL LOW (ref >90)
Glucose, Bld: 106 mg/dL — ABNORMAL HIGH (ref 70–99)
Potassium: 3.7 mEq/L (ref 3.5–5.1)

## 2012-03-04 LAB — CARDIAC PANEL(CRET KIN+CKTOT+MB+TROPI)
CK, MB: 2 ng/mL (ref 0.3–4.0)
CK, MB: 1.8 ng/mL (ref 0.3–4.0)
Troponin I: <0.3 ng/mL (ref <0.30)
Troponin I: <0.3 ng/mL (ref <0.30)

## 2012-03-04 LAB — CREATININE, SERUM
GFR calc Af Amer: 28 mL/min — ABNORMAL LOW (ref >90)
GFR calc non Af Amer: 24 mL/min — ABNORMAL LOW (ref >90)

## 2012-03-04 LAB — CBC
HCT: 43.1 % (ref 39.0–52.0)
Hemoglobin: 14 g/dL (ref 13.0–17.0)
RBC: 4.87 MIL/uL (ref 4.22–5.81)
WBC: 8.3 10*3/uL (ref 4.0–10.5)

## 2012-03-04 LAB — TSH: TSH: 4.413 u[IU]/mL (ref 0.350–4.500)

## 2012-03-04 LAB — MRSA PCR SCREENING: MRSA by PCR: NEGATIVE

## 2012-03-04 MED ORDER — CARVEDILOL 12.5 MG PO TABS
12.5000 mg | ORAL_TABLET | Freq: Two times a day (BID) | ORAL | Status: DC
  Administered 2012-03-04 – 2012-03-06 (×5): 12.5 mg via ORAL
  Filled 2012-03-04 (×7): qty 1

## 2012-03-04 NOTE — ED Provider Notes (Signed)
Medical screening examination/treatment/procedure(s) were performed by non-physician practitioner and as supervising physician I was immediately available for consultation/collaboration.   Dione Booze, MD 03/04/12 [phone]

## 2012-03-04 NOTE — Progress Notes (Signed)
Pt's HR is staying the 30s and 40s sinus brady. Pt is currently in bed watching TV and pt reports feeling lightheaded. MD is notified. Order received to hold beta blocker in the AM. Will continue to assess and monitor pt.

## 2012-03-04 NOTE — Progress Notes (Signed)
SUBJECTIVE:  No chest pain since getting to the floor.   PHYSICAL EXAM Filed Vitals:   03/03/12 2345 03/04/12 0000 03/04/12 0400 03/04/12 0500  BP: 132/87 133/89 126/91   Pulse: 43 46 56   Temp:  97.7 F (36.5 C) 97.4 F (36.3 C)   TempSrc:  Oral Oral   Resp: 15 17 17    Height:      Weight:    216 lb 11.4 oz (98.3 kg)  SpO2: 99% 98% 95%    General:  No distress Lungs:  Clear Heart:  RRR Abdomen:  Positive bowel sounds, no rebound no guarding Extremities:  No edema.  LABS: Lab Results  Component Value Date   CKTOTAL 132 03/04/2012   CKMB 2.0 03/04/2012   TROPONINI <0.30 03/04/2012   Results for orders placed during the hospital encounter of 03/03/12 (from the past 24 hour(s))  CBC WITH DIFFERENTIAL     Status: Normal   Collection Time   03/03/12  1:39 PM      Component Value Range   WBC 8.2  4.0 - 10.5 K/uL   RBC 4.59  4.22 - 5.81 MIL/uL   Hemoglobin 13.3  13.0 - 17.0 g/dL   HCT 40.9  81.1 - 91.4 %   MCV 87.8  78.0 - 100.0 fL   MCH 29.0  26.0 - 34.0 pg   MCHC 33.0  30.0 - 36.0 g/dL   RDW 78.2  95.6 - 21.3 %   Platelets 269  150 - 400 K/uL   Neutrophils Relative 62  43 - 77 %   Neutro Abs 5.2  1.7 - 7.7 K/uL   Lymphocytes Relative 25  12 - 46 %   Lymphs Abs 2.1  0.7 - 4.0 K/uL   Monocytes Relative 7  3 - 12 %   Monocytes Absolute 0.5  0.1 - 1.0 K/uL   Eosinophils Relative 5  0 - 5 %   Eosinophils Absolute 0.4  0.0 - 0.7 K/uL   Basophils Relative 1  0 - 1 %   Basophils Absolute 0.1  0.0 - 0.1 K/uL  BASIC METABOLIC PANEL     Status: Abnormal   Collection Time   03/03/12  1:39 PM      Component Value Range   Sodium 141  135 - 145 mEq/L   Potassium 4.0  3.5 - 5.1 mEq/L   Chloride 103  96 - 112 mEq/L   CO2 27  19 - 32 mEq/L   Glucose, Bld 92  70 - 99 mg/dL   BUN 35 (*) 6 - 23 mg/dL   Creatinine, Ser 0.86 (*) 0.50 - 1.35 mg/dL   Calcium 9.2  8.4 - 57.8 mg/dL   GFR calc non Af Amer 27 (*) >90 mL/min   GFR calc Af Amer 31 (*) >90 mL/min  POCT I-STAT TROPONIN I      Status: Normal   Collection Time   03/03/12  1:49 PM      Component Value Range   Troponin i, poc 0.04  0.00 - 0.08 ng/mL   Comment 3           MRSA PCR SCREENING     Status: Normal   Collection Time   03/03/12 11:18 PM      Component Value Range   MRSA by PCR NEGATIVE  NEGATIVE  CARDIAC PANEL(CRET KIN+CKTOT+MB+TROPI)     Status: Normal   Collection Time   03/03/12 11:31 PM      Component Value Range  Total CK 149  7 - 232 U/L   CK, MB 2.2  0.3 - 4.0 ng/mL   Troponin I <0.30  <0.30 ng/mL   Relative Index 1.5  0.0 - 2.5  CBC     Status: Normal   Collection Time   03/03/12 11:32 PM      Component Value Range   WBC 8.3  4.0 - 10.5 K/uL   RBC 4.87  4.22 - 5.81 MIL/uL   Hemoglobin 14.0  13.0 - 17.0 g/dL   HCT 16.1  09.6 - 04.5 %   MCV 88.5  78.0 - 100.0 fL   MCH 28.7  26.0 - 34.0 pg   MCHC 32.5  30.0 - 36.0 g/dL   RDW 40.9  81.1 - 91.4 %   Platelets 268  150 - 400 K/uL  CREATININE, SERUM     Status: Abnormal   Collection Time   03/03/12 11:32 PM      Component Value Range   Creatinine, Ser 2.93 (*) 0.50 - 1.35 mg/dL   GFR calc non Af Amer 24 (*) >90 mL/min   GFR calc Af Amer 28 (*) >90 mL/min  CARDIAC PANEL(CRET KIN+CKTOT+MB+TROPI)     Status: Normal   Collection Time   03/04/12  4:02 AM      Component Value Range   Total CK 132  7 - 232 U/L   CK, MB 2.0  0.3 - 4.0 ng/mL   Troponin I <0.30  <0.30 ng/mL   Relative Index 1.5  0.0 - 2.5  BASIC METABOLIC PANEL     Status: Abnormal   Collection Time   03/04/12  4:02 AM      Component Value Range   Sodium 143  135 - 145 mEq/L   Potassium 3.7  3.5 - 5.1 mEq/L   Chloride 104  96 - 112 mEq/L   CO2 27  19 - 32 mEq/L   Glucose, Bld 106 (*) 70 - 99 mg/dL   BUN 38 (*) 6 - 23 mg/dL   Creatinine, Ser 7.82 (*) 0.50 - 1.35 mg/dL   Calcium 8.8  8.4 - 95.6 mg/dL   GFR calc non Af Amer 26 (*) >90 mL/min   GFR calc Af Amer 30 (*) >90 mL/min  PROTIME-INR     Status: Abnormal   Collection Time   03/04/12  4:02 AM      Component Value Range    Prothrombin Time 16.2 (*) 11.6 - 15.2 seconds   INR 1.27  0.00 - 1.49    Intake/Output Summary (Last 24 hours) at 03/04/12 2130 Last data filed at 03/04/12 0500  Gross per 24 hour  Intake    515 ml  Output    300 ml  Net    215 ml    ASSESSMENT AND PLAN:  1) Chest pain: Stress perfusion study in March consistent with known total occlusion of the RCA.  Medical management.  Enzymes negative x 2.  No further work up suggested.  With his renal insufficiency and no objective evidence of ischemia I do not think that the risk of cath is indicated.    2) HYPERTENSION: BP very well controlled.  Continue current meds but will reduce beta blocker with heart rates of 38 overnight and mild light headedness.    3) CHF: EF was actually better on echo in March.  He seems to be euvolemic.  At this point, no change in therapy is indicated.  No further cardiovascular testing is indicated. [name] Fearing  Verlee [name] 03/04/2012 6:26 AM

## 2012-03-04 NOTE — Progress Notes (Signed)
Ok to give Coreg 12.5 mg per Dr. Antoine Poche hr [phone]. No c/o voiced when in room

## 2012-03-05 LAB — BASIC METABOLIC PANEL
GFR calc Af Amer: 36 mL/min — ABNORMAL LOW (ref >90)
GFR calc non Af Amer: 31 mL/min — ABNORMAL LOW (ref >90)
Potassium: 3.8 mEq/L (ref 3.5–5.1)
Sodium: 140 mEq/L (ref 135–145)

## 2012-03-05 LAB — PROTIME-INR: INR: 1.25 (ref 0.00–1.49)

## 2012-03-05 MED ORDER — RANOLAZINE ER 500 MG PO TB12
500.0000 mg | ORAL_TABLET | Freq: Two times a day (BID) | ORAL | Status: DC
  Administered 2012-03-05 – 2012-03-06 (×3): 500 mg via ORAL
  Filled 2012-03-05 (×5): qty 1

## 2012-03-05 NOTE — Progress Notes (Signed)
   SUBJECTIVE:  Chest pain last night.  He reports throbbing discomfort.    PHYSICAL EXAM Filed Vitals:   03/05/12 0300 03/05/12 0400 03/05/12 0500 03/05/12 0600  BP: 136/73 130/71 132/76 147/84  Pulse: 44 48 51 47  Temp:  98.2 F (36.8 C)    TempSrc:  Oral    Resp: 14 15 14 14   Height:      Weight:      SpO2: 100% 99% 97% 100%   General:  No distress Lungs:  Clear Heart:  RRR Abdomen:  Positive bowel sounds, no rebound no guarding Extremities:  No edema.  LABS: Lab Results  Component Value Date   CKTOTAL 125 03/04/2012   CKMB 1.8 03/04/2012   TROPONINI <0.30 03/04/2012   Results for orders placed during the hospital encounter of 03/03/12 (from the past 24 hour(s))  CARDIAC PANEL(CRET KIN+CKTOT+MB+TROPI)     Status: Normal   Collection Time   03/04/12 10:55 AM      Component Value Range   Total CK 125  7 - 232 U/L   CK, MB 1.8  0.3 - 4.0 ng/mL   Troponin I <0.30  <0.30 ng/mL   Relative Index 1.4  0.0 - 2.5  BASIC METABOLIC PANEL     Status: Abnormal   Collection Time   03/05/12  4:20 AM      Component Value Range   Sodium 140  135 - 145 mEq/L   Potassium 3.8  3.5 - 5.1 mEq/L   Chloride 104  96 - 112 mEq/L   CO2 26  19 - 32 mEq/L   Glucose, Bld 96  70 - 99 mg/dL   BUN 29 (*) 6 - 23 mg/dL   Creatinine, Ser 1.61 (*) 0.50 - 1.35 mg/dL   Calcium 8.6  8.4 - 09.6 mg/dL   GFR calc non Af Amer 31 (*) >90 mL/min   GFR calc Af Amer 36 (*) >90 mL/min  PROTIME-INR     Status: Abnormal   Collection Time   03/05/12  4:20 AM      Component Value Range   Prothrombin Time 16.0 (*) 11.6 - 15.2 seconds   INR 1.25  0.00 - 1.49    Intake/Output Summary (Last 24 hours) at 03/05/12 0454 Last data filed at 03/05/12 0600  Gross per 24 hour  Intake   1150 ml  Output   1150 ml  Net      0 ml    ASSESSMENT AND PLAN:  1) Chest pain: Stress perfusion study in March consistent with known total occlusion of the RCA.  Medical management.  Enzymes negative x 3.  With his renal insufficiency  and no objective evidence of ischemia I do not think that the risk of cath is indicated.  I am going to give him Ranexa as he is already maxed out on other medications.  We could consider PCI of the TCO.  However, I think the risk of renal failure would be high.  2) HYPERTENSION: BP very well controlled.  Continue current meds.    3) CHF: EF was actually better on echo in March.  He seems to be euvolemic.  At this point, no change in therapy is indicated.  No further cardiovascular testing is indicated.  4) CKD:  Creat down from yesterday.  No further work up.  This is secondary to poorly controlled HTN.   Rollene Rotunda 03/05/2012 6:26 AM

## 2012-03-06 ENCOUNTER — Encounter (HOSPITAL_COMMUNITY): Payer: Self-pay | Admitting: Physician Assistant

## 2012-03-06 LAB — BASIC METABOLIC PANEL
BUN: 25 mg/dL — ABNORMAL HIGH (ref 6–23)
Creatinine, Ser: 2.1 mg/dL — ABNORMAL HIGH (ref 0.50–1.35)
GFR calc Af Amer: 42 mL/min — ABNORMAL LOW (ref >90)
GFR calc non Af Amer: 37 mL/min — ABNORMAL LOW (ref >90)
Potassium: 3.8 mEq/L (ref 3.5–5.1)

## 2012-03-06 LAB — PROTIME-INR: Prothrombin Time: 15.3 seconds — ABNORMAL HIGH (ref 11.6–15.2)

## 2012-03-06 MED ORDER — RANOLAZINE ER 500 MG PO TB12: 500.0000 mg | ORAL_TABLET | Freq: Two times a day (BID) | ORAL | Status: DC

## 2012-03-06 MED ORDER — CARVEDILOL 12.5 MG PO TABS: 12.5000 mg | ORAL_TABLET | Freq: Two times a day (BID) | ORAL | Status: DC

## 2012-03-06 NOTE — Discharge Summary (Signed)
CARDIOLOGY DISCHARGE SUMMARY   Patient ID: [name] MRN: [id number] DOB/AGE: 1966-03-26 46 y.o.  Admit date: 03/03/2012 Discharge date: 03/06/2012  Primary Discharge Diagnosis:  Anginal pain, medical therapy for CAD  Secondary Discharge Diagnosis:  Past Medical History  Diagnosis Date  . CAD (coronary artery disease) 12/2006    NSTEMI 2008 in the setting of profound epistaxis and anemia, last cath 2012 03/2011 (med rx)  . Hypertension   . Hyperlipidemia   . Chronic kidney disease     Average creatine of about 2.3  . Ischemic cardiomyopathy     Prior EF 25%, apical, posterior akinesis, inferior akinesis, moderate LVH, mild MR, mild decreased RV function,  65% EF 2012 echo.  . Anemia    Hospital Course: [name] is a 46 year old male with a history of coronary artery disease. He had chest pain and he came to the hospital where he was admitted for further evaluation and treatment.  His cardiac enzymes were negative for MI. His chest x-ray did not show any new changes. His symptoms resolved on medical therapy. He had Ranexa added to his other medications. He was felt to be euvolemic. Dr. Antoine Poche did not feel any further cardiac testing was indicated.  On 03/06/2012, Dr. Antoine Poche evaluated [name]. His creatinine is elevated but lower than his admission labs. Dr. Antoine Poche feels this is secondary to poorly controlled hypertension and recommended continuing current medications. He was ambulating without chest pain or shortness of breath and considered stable for discharge, to follow up as an outpatient.  Labs:   Lab Results  Component Value Date   WBC 8.3 03/03/2012   HGB 14.0 03/03/2012   HCT 43.1 03/03/2012   MCV 88.5 03/03/2012   PLT 268 03/03/2012    Lab 03/06/12 0500  NA 139  K 3.8  CL 101  CO2 26  BUN 25*  CREATININE 2.10*  CALCIUM 8.9  PROT --  BILITOT --  ALKPHOS --  ALT --  AST --  GLUCOSE 90    Basename 03/04/12 1055 03/04/12 0402 03/03/12 2331    CKTOTAL 125 132 149  CKMB 1.8 2.0 2.2  CKMBINDEX -- -- --  TROPONINI <0.30 <0.30 <0.30    Basename 03/06/12 0500  INR 1.18      Radiology: Dg Chest 2 View 03/03/2012  *RADIOLOGY REPORT*  Clinical Data: Mid chest pain, headaches hypertension  CHEST - 2 VIEW  Comparison: 11/16/2011  Findings: Enlargement of cardiac silhouette. Slight pulmonary vascular congestion. Calcified tortuous aorta. Minimal atelectasis versus scarring lower left chest. Opacity right base may represent atelectasis or infiltrate, slightly improved. Minimal peribronchial thickening. No pleural effusion or pneumothorax. Bones unremarkable.  IMPRESSION: Enlargement of cardiac silhouette with pulmonary vascular congestion. Bronchitic changes with minimal left base atelectasis or scarring. Chronic opacity at the right base, favor atelectasis over infiltrate, improved versus previous study.  Original Report Authenticated By: Lollie Marrow, M.D.   EKG: 05-Mar-2012 07:06:37  Sinus bradycardia with sinus arrhythmia with 1st degree A-V block Minimal voltage criteria for LVH, may be normal variant Inferior infarct , age undetermined Possible Anterior infarct , age undetermined No significant change since last tracing 03/04/12 23mm/s 41mm/mV 100Hz  8.0.1 12SL 241 HD CID: 0 Referred by: Confirmed By: Viann Fish MD Vent. rate 51 BPM PR interval 218 ms QRS duration 108 ms QT/QTc 480/442 ms P-R-T axes 60 -29 72   FOLLOW UP PLANS AND APPOINTMENTS No Known Allergies   Medication List  As of 03/06/2012 11:51 AM   TAKE these  medications         amLODipine 10 MG tablet   Commonly known as: NORVASC   Take 10 mg by mouth every morning.      aspirin EC 81 MG tablet   Take 81 mg by mouth daily.      carvedilol 12.5 MG tablet   Commonly known as: COREG   Take 1 tablet (12.5 mg total) by mouth 2 (two) times daily with a meal.      furosemide 40 MG tablet   Commonly known as: LASIX   Take 40 mg by mouth 2 (two) times daily.       isosorbide mononitrate 120 MG 24 hr tablet   Commonly known as: IMDUR   Take 120 mg by mouth daily.      nitroGLYCERIN 0.4 MG SL tablet   Commonly known as: NITROSTAT   Place 0.4 mg under the tongue every 5 (five) minutes x 3 doses as needed. For chest pain      pravastatin 40 MG tablet   Commonly known as: PRAVACHOL   Take 40 mg by mouth every evening.      ranolazine 500 MG 12 hr tablet   Commonly known as: RANEXA   Take 1 tablet (500 mg total) by mouth 2 (two) times daily.      spironolactone 25 MG tablet   Commonly known as: ALDACTONE   Take 12.5 mg by mouth daily.           BRING ALL MEDICATIONS WITH YOU TO FOLLOW UP APPOINTMENTS  Time spent with patient to include physician time: 33 min Signed: Theodore Demark 03/06/2012, 9:48 AM Co-Sign MD  Patient seen and examined.  Plan as discussed in my rounding note for today and outlined above. Fayrene Fearing [facility]  03/06/2012  10:18 PM

## 2012-03-06 NOTE — Progress Notes (Signed)
   SUBJECTIVE:     PHYSICAL EXAM Filed Vitals:   03/05/12 1058 03/05/12 1326 03/05/12 2027 03/06/12 0452  BP: 163/99 140/82 157/95 158/91  Pulse: 53 51 48 59  Temp: 97.8 F (36.6 C) 97.7 F (36.5 C) 97.9 F (36.6 C) 97.9 F (36.6 C)  TempSrc: Oral Oral Oral Oral  Resp:  18 18 18   Height:      Weight:    220 lb 0.3 oz (99.8 kg)  SpO2: 100% 100% 98% 96%   General:  No distress Lungs:  Clear Heart:  RRR Abdomen:  Positive bowel sounds, no rebound no guarding Extremities:  No edema.  LABS: Lab Results  Component Value Date   CKTOTAL 125 03/04/2012   CKMB 1.8 03/04/2012   TROPONINI <0.30 03/04/2012   Results for orders placed during the hospital encounter of 03/03/12 (from the past 24 hour(s))  BASIC METABOLIC PANEL     Status: Abnormal   Collection Time   03/06/12  5:00 AM      Component Value Range   Sodium 139  135 - 145 mEq/L   Potassium 3.8  3.5 - 5.1 mEq/L   Chloride 101  96 - 112 mEq/L   CO2 26  19 - 32 mEq/L   Glucose, Bld 90  70 - 99 mg/dL   BUN 25 (*) 6 - 23 mg/dL   Creatinine, Ser 4.09 (*) 0.50 - 1.35 mg/dL   Calcium 8.9  8.4 - 81.1 mg/dL   GFR calc non Af Amer 37 (*) >90 mL/min   GFR calc Af Amer 42 (*) >90 mL/min  PROTIME-INR     Status: Abnormal   Collection Time   03/06/12  5:00 AM      Component Value Range   Prothrombin Time 15.3 (*) 11.6 - 15.2 seconds   INR 1.18  0.00 - 1.49    Intake/Output Summary (Last 24 hours) at 03/06/12 0914 Last data filed at 03/06/12 0453  Gross per 24 hour  Intake   1130 ml  Output   1428 ml  Net   -298 ml    ASSESSMENT AND PLAN:  1) Chest pain: Stress perfusion study in March consistent with known total occlusion of the RCA.  Medical management.  Enzymes negative x 3.  With his renal insufficiency and no objective evidence of ischemia I do not think that the risk of cath is indicated.  We could consider PCI of the TCO.  However, I think the risk of renal failure would be high. I gave him Ranexa as he is already maxed  out on other medications.   2) HYPERTENSION: BP slightly high but better than it has been in the past.  Continue current meds.    3) CHF: EF was actually better on echo in March.  He seems to be euvolemic.  At this point, no change in therapy is indicated.  No further cardiovascular testing is indicated.  4) CKD:  Creat down from yesterday.  No further work up.  This is secondary to poorly controlled HTN.    OK to go home.   [name] 03/06/2012 9:14 AM

## 2012-03-11 ENCOUNTER — Telehealth: Payer: Self-pay | Admitting: Cardiology

## 2012-03-11 NOTE — Telephone Encounter (Signed)
Fu call °Patient returning your call °

## 2012-03-11 NOTE — Telephone Encounter (Signed)
Pt was called to schedule an appointment to follow up phost

## 2012-03-19 ENCOUNTER — Ambulatory Visit (INDEPENDENT_AMBULATORY_CARE_PROVIDER_SITE_OTHER): Payer: Medicaid Other | Admitting: Cardiology

## 2012-03-19 ENCOUNTER — Encounter: Payer: Self-pay | Admitting: Cardiology

## 2012-03-19 VITALS — BP 140/100 | HR 69 | Ht 70.0 in | Wt 222.0 lb

## 2012-03-19 DIAGNOSIS — I2510 Atherosclerotic heart disease of native coronary artery without angina pectoris: Secondary | ICD-10-CM

## 2012-03-19 DIAGNOSIS — I10 Essential (primary) hypertension: Secondary | ICD-10-CM

## 2012-03-19 DIAGNOSIS — I5022 Chronic systolic (congestive) heart failure: Secondary | ICD-10-CM

## 2012-03-19 DIAGNOSIS — N189 Chronic kidney disease, unspecified: Secondary | ICD-10-CM

## 2012-03-19 NOTE — Patient Instructions (Addendum)
The current medical regimen is effective;  continue present plan and medications.  Follow up in 6 months with Dr Hochrein.  You will receive a letter in the mail 2 months before you are due.  Please call us when you receive this letter to schedule your follow up appointment.  

## 2012-03-19 NOTE — Progress Notes (Signed)
HPI The patient presents for followup of his coronary disease and difficult to control hypertension. He was recently admitted for evaluation of chest discomfort. He ruled out for myocardial infarction. Ranexa was added to his medications.   No Known Allergies  Current Outpatient Prescriptions  Medication Sig Dispense Refill  . amLODipine (NORVASC) 10 MG tablet Take 10 mg by mouth every morning.       Marland Kitchen aspirin EC 81 MG tablet Take 81 mg by mouth daily.      . carvedilol (COREG) 25 MG tablet Take 25 mg by mouth 2 (two) times daily with a meal.      . furosemide (LASIX) 40 MG tablet Take 40 mg by mouth 2 (two) times daily.      . isosorbide mononitrate (IMDUR) 120 MG 24 hr tablet Take 120 mg by mouth daily.      . nitroGLYCERIN (NITROSTAT) 0.4 MG SL tablet Place 0.4 mg under the tongue every 5 (five) minutes x 3 doses as needed. For chest pain      . pravastatin (PRAVACHOL) 40 MG tablet Take 40 mg by mouth every evening.      . ranolazine (RANEXA) 500 MG 12 hr tablet Take 1 tablet (500 mg total) by mouth 2 (two) times daily.  60 tablet  11  . spironolactone (ALDACTONE) 25 MG tablet Take 12.5 mg by mouth daily.        Marland Kitchen DISCONTD: nitroGLYCERIN (NITROSTAT) 0.4 MG SL tablet Place 1 tablet (0.4 mg total) under the tongue every 5 (five) minutes as needed for chest pain.  25 tablet  12    Past Medical History  Diagnosis Date  . CAD (coronary artery disease) 12/2006    NSTEMI 2008 in the setting of profound epistaxis and anemia, last cath 2012 03/2011 (med rx)  . Hypertension   . Hyperlipidemia   . Chronic kidney disease     Average creatine of about 2.3  . Ischemic cardiomyopathy     Prior EF 25%, apical, posterior akinesis, inferior akinesis, moderate LVH, mild MR, mild decreased RV function,  65% EF 2012 echo.  . Anemia     Past Surgical History  Procedure Date  . Cardiac catheterization 2012    total RCA occlusion with left-to-right collaterals supplying the distal RCA branches,  mild diffuse nonobstructive disease of LAD and LCx. Medically managed    ROS:  As stated in the HPI and negative for all other systems.  PHYSICAL EXAM BP 140/100  Pulse 69  Ht 5\' 10"  (1.778 m)  Wt 222 lb (100.699 kg)  BMI 31.85 kg/m2 GENERAL:  Well appearing NECK:  No jugular venous distention, waveform within normal limits, carotid upstroke brisk and symmetric, no bruits, no thyromegaly LUNGS:  Clear to auscultation bilaterally BACK:  No CVA tenderness CHEST:  Unremarkable HEART:  PMI not displaced or sustained,S1 and S2 within normal limits, no S3, no S4, no clicks, no rubs, no murmurs ABD:  Flat, positive bowel sounds normal in frequency in pitch, no bruits, no rebound, no guarding, no midline pulsatile mass, no hepatomegaly, no splenomegaly EXT:  2 plus pulses throughout, no edema, no cyanosis no clubbing  EKG:  Sinus rhythm, rate 65, axis within normal limits, RSR prime V1 and V2, QTC prolonged, poor anterior R wave progression, no acute ST-T wave changes.  03/19/2012   ASSESSMENT AND PLAN  CAD -  He has no ongoing ischemia. He will continue with the meds as listed.  CHRONIC SYSTOLIC HEART FAILURE -  He seems to be euvolemic. He will continue with meds as listed.  CHRONIC KIDNEY DISEASE UNSPECIFIED - This was stable urine his recent hospitalization. He is followed also by nephrology.  HYPERTENSION - His blood pressure is under reasonable although not perfect control. He will remain on the meds as listed.

## 2012-03-27 ENCOUNTER — Telehealth: Payer: Self-pay | Admitting: Cardiology

## 2012-03-27 NOTE — Telephone Encounter (Signed)
Spoke with Britta Mccreedy from [employer]. Form was faxed around July 8. Unable to locate form. Form refaxed today to Denny Peon in [place]. She will hold onto form until [facility] back in office next week. Britta Mccreedy is aware MD currently  out of office to complete form.

## 2012-03-27 NOTE — Telephone Encounter (Signed)
She sent a FL2 form to be filled out and sent back but she has not received it yet and she needs it asap so she was calling to check the status of the form pls give her a call back

## 2012-04-01 ENCOUNTER — Telehealth: Payer: Self-pay | Admitting: Cardiology

## 2012-04-01 NOTE — Telephone Encounter (Signed)
Please return call to case manager Stanton Kidney- [facility]  [phone] regarding patient paperwork

## 2012-04-01 NOTE — Telephone Encounter (Signed)
Left message to call back  

## 2012-04-02 NOTE — Telephone Encounter (Signed)
Left another message for [name] stating that we do have the paperwork and that Dr Antoine Poche is not back in this office until 8/8.  I will send the completed paperwork ASAP.

## 2012-04-10 ENCOUNTER — Telehealth: Payer: Self-pay | Admitting: Cardiology

## 2012-04-18 ENCOUNTER — Telehealth: Payer: Self-pay | Admitting: Cardiology

## 2012-04-18 NOTE — Telephone Encounter (Signed)
[place] [employer] [facility] [employer] [facility] called and was checking status of paperwork for this pt.

## 2012-04-21 NOTE — Telephone Encounter (Signed)
[name] would like paperwork re-faxed to her at [phone]  Request given to [name] in Medical Records.

## 2012-04-21 NOTE — Telephone Encounter (Signed)
[place] [employer] [facility] Form faxed to  [facility] @ [phone]  04/21/12/km

## 2012-05-08 ENCOUNTER — Other Ambulatory Visit: Payer: Self-pay | Admitting: Cardiology

## 2012-05-08 NOTE — Telephone Encounter (Signed)
..   Requested Prescriptions   Pending Prescriptions Disp Refills  . hydrALAZINE (APRESOLINE) 50 MG tablet [Pharmacy Med Name: HYDRALAZINE 50 MG TABLET] 270 tablet 2    Sig: TAKE 1 TABLET (50 MG TOTAL) BY MOUTH 3 (THREE) TIMES DAILY.  Marland Kitchen amLODipine (NORVASC) 10 MG tablet [Pharmacy Med Name: AMLODIPINE BESYLATE 10 MG TAB] 90 tablet 2    Sig: TAKE 1 TABLET (10 MG TOTAL) BY MOUTH DAILY.  Marland Kitchen spironolactone (ALDACTONE) 25 MG tablet [Pharmacy Med Name: SPIRONOLACTONE 25 MG TABLET] 45 tablet 2    Sig: TAKE 0.5 TABLET (12.5 MG TOTAL) BY MOUTH DAILY.

## 2012-05-29 ENCOUNTER — Telehealth: Payer: Self-pay | Admitting: Cardiology

## 2012-05-29 NOTE — Telephone Encounter (Signed)
New problem:  nitrogylcerin 0.4 mg.    [facility]

## 2012-05-30 MED ORDER — NITROGLYCERIN 0.4 MG SL SUBL: 0.4000 mg | SUBLINGUAL_TABLET | SUBLINGUAL | Status: DC | PRN

## 2012-06-23 ENCOUNTER — Other Ambulatory Visit: Payer: Self-pay | Admitting: Cardiology

## 2012-08-14 ENCOUNTER — Telehealth: Payer: Self-pay | Admitting: Cardiology

## 2012-08-14 NOTE — Telephone Encounter (Signed)
OK to renew CAPS program for this pt.  Verbal order given

## 2012-08-14 NOTE — Telephone Encounter (Signed)
They need an verbal order for CAPP program to get aide 3hrs a day

## 2012-08-28 ENCOUNTER — Inpatient Hospital Stay (HOSPITAL_COMMUNITY)
Admission: EM | Admit: 2012-08-28 | Discharge: 2012-08-30 | DRG: 194 | Disposition: A | Discharge status: Home / Self Care | Payer: Medicaid Other | Attending: Internal Medicine | Admitting: Internal Medicine

## 2012-08-28 ENCOUNTER — Emergency Department (HOSPITAL_COMMUNITY): Payer: Medicaid Other

## 2012-08-28 ENCOUNTER — Encounter (HOSPITAL_COMMUNITY): Payer: Self-pay | Admitting: Emergency Medicine

## 2012-08-28 DIAGNOSIS — D649 Anemia, unspecified: Secondary | ICD-10-CM | POA: Diagnosis present

## 2012-08-28 DIAGNOSIS — I129 Hypertensive chronic kidney disease with stage 1 through stage 4 chronic kidney disease, or unspecified chronic kidney disease: Secondary | ICD-10-CM | POA: Diagnosis present

## 2012-08-28 DIAGNOSIS — I498 Other specified cardiac arrhythmias: Secondary | ICD-10-CM | POA: Diagnosis present

## 2012-08-28 DIAGNOSIS — I5022 Chronic systolic (congestive) heart failure: Secondary | ICD-10-CM | POA: Diagnosis present

## 2012-08-28 DIAGNOSIS — I509 Heart failure, unspecified: Secondary | ICD-10-CM | POA: Diagnosis present

## 2012-08-28 DIAGNOSIS — G8929 Other chronic pain: Secondary | ICD-10-CM | POA: Diagnosis present

## 2012-08-28 DIAGNOSIS — R001 Bradycardia, unspecified: Secondary | ICD-10-CM

## 2012-08-28 DIAGNOSIS — N179 Acute kidney failure, unspecified: Secondary | ICD-10-CM | POA: Diagnosis present

## 2012-08-28 DIAGNOSIS — Z7982 Long term (current) use of aspirin: Secondary | ICD-10-CM

## 2012-08-28 DIAGNOSIS — M545 Low back pain, unspecified: Secondary | ICD-10-CM | POA: Diagnosis present

## 2012-08-28 DIAGNOSIS — R079 Chest pain, unspecified: Secondary | ICD-10-CM | POA: Diagnosis present

## 2012-08-28 DIAGNOSIS — I252 Old myocardial infarction: Secondary | ICD-10-CM

## 2012-08-28 DIAGNOSIS — Z87891 Personal history of nicotine dependence: Secondary | ICD-10-CM

## 2012-08-28 DIAGNOSIS — E785 Hyperlipidemia, unspecified: Secondary | ICD-10-CM | POA: Diagnosis present

## 2012-08-28 DIAGNOSIS — I2510 Atherosclerotic heart disease of native coronary artery without angina pectoris: Secondary | ICD-10-CM | POA: Diagnosis present

## 2012-08-28 DIAGNOSIS — R55 Syncope and collapse: Secondary | ICD-10-CM

## 2012-08-28 DIAGNOSIS — I2589 Other forms of chronic ischemic heart disease: Secondary | ICD-10-CM | POA: Diagnosis present

## 2012-08-28 DIAGNOSIS — R0902 Hypoxemia: Secondary | ICD-10-CM

## 2012-08-28 DIAGNOSIS — I200 Unstable angina: Secondary | ICD-10-CM

## 2012-08-28 DIAGNOSIS — R0602 Shortness of breath: Secondary | ICD-10-CM | POA: Diagnosis present

## 2012-08-28 DIAGNOSIS — J189 Pneumonia, unspecified organism: Secondary | ICD-10-CM | POA: Diagnosis present

## 2012-08-28 DIAGNOSIS — I10 Essential (primary) hypertension: Secondary | ICD-10-CM | POA: Diagnosis present

## 2012-08-28 DIAGNOSIS — I4949 Other premature depolarization: Secondary | ICD-10-CM | POA: Diagnosis present

## 2012-08-28 DIAGNOSIS — N183 Chronic kidney disease, stage 3 unspecified: Secondary | ICD-10-CM | POA: Diagnosis present

## 2012-08-28 DIAGNOSIS — N189 Chronic kidney disease, unspecified: Secondary | ICD-10-CM

## 2012-08-28 LAB — URINALYSIS, ROUTINE W REFLEX MICROSCOPIC
Nitrite: NEGATIVE
Protein, ur: 100 mg/dL — AB
Urobilinogen, UA: 1 mg/dL (ref 0.0–1.0)

## 2012-08-28 LAB — POCT I-STAT TROPONIN I: Troponin i, poc: 0.09 ng/mL (ref 0.00–0.08)

## 2012-08-28 LAB — TROPONIN I: Troponin I: <0.3 ng/mL (ref <0.30)

## 2012-08-28 LAB — URINE MICROSCOPIC-ADD ON

## 2012-08-28 LAB — CBC
MCH: 28.1 pg (ref 26.0–34.0)
MCV: 87.1 fL (ref 78.0–100.0)
Platelets: 247 10*3/uL (ref 150–400)
RDW: 14.2 % (ref 11.5–15.5)
WBC: 5 10*3/uL (ref 4.0–10.5)

## 2012-08-28 LAB — BASIC METABOLIC PANEL
Calcium: 9.1 mg/dL (ref 8.4–10.5)
Creatinine, Ser: 3.16 mg/dL — ABNORMAL HIGH (ref 0.50–1.35)
GFR calc Af Amer: 26 mL/min — ABNORMAL LOW (ref >90)
Sodium: 134 mEq/L — ABNORMAL LOW (ref 135–145)

## 2012-08-28 MED ORDER — CARVEDILOL 25 MG PO TABS
25.0000 mg | ORAL_TABLET | Freq: Two times a day (BID) | ORAL | Status: DC
  Administered 2012-08-29 – 2012-08-30 (×3): 25 mg via ORAL
  Filled 2012-08-28 (×5): qty 1

## 2012-08-28 MED ORDER — NITROGLYCERIN 0.4 MG SL SUBL: 0.4000 mg | SUBLINGUAL_TABLET | SUBLINGUAL | Status: DC | PRN

## 2012-08-28 MED ORDER — RANOLAZINE ER 500 MG PO TB12
500.0000 mg | ORAL_TABLET | Freq: Two times a day (BID) | ORAL | Status: DC
Start: 2012-08-28 — End: 2012-08-30
  Administered 2012-08-29 – 2012-08-30 (×4): 500 mg via ORAL
  Filled 2012-08-28 (×5): qty 1

## 2012-08-28 MED ORDER — AMLODIPINE BESYLATE 10 MG PO TABS: 10.0000 mg | ORAL_TABLET | Freq: Every day | ORAL | Status: DC

## 2012-08-28 MED ORDER — SODIUM CHLORIDE 0.9 % IV SOLN: 250.0000 mL | INTRAVENOUS | Status: DC | PRN

## 2012-08-28 MED ORDER — SODIUM CHLORIDE 0.9 % IJ SOLN: 3.0000 mL | INTRAMUSCULAR | Status: DC | PRN

## 2012-08-28 MED ORDER — FUROSEMIDE 40 MG PO TABS
40.0000 mg | ORAL_TABLET | Freq: Two times a day (BID) | ORAL | Status: DC
  Administered 2012-08-29 – 2012-08-30 (×3): 40 mg via ORAL
  Filled 2012-08-28 (×5): qty 1

## 2012-08-28 MED ORDER — MORPHINE SULFATE 2 MG/ML IJ SOLN: 1.0000 mg | INTRAMUSCULAR | Status: DC | PRN

## 2012-08-28 MED ORDER — HYDROMORPHONE HCL PF 1 MG/ML IJ SOLN
1.0000 mg | Freq: Once | INTRAMUSCULAR | Status: AC
  Administered 2012-08-28: 1 mg via INTRAVENOUS
  Filled 2012-08-28: qty 1

## 2012-08-28 MED ORDER — AZITHROMYCIN 500 MG PO TABS
500.0000 mg | ORAL_TABLET | Freq: Every day | ORAL | Status: DC
  Administered 2012-08-29: 500 mg via ORAL
  Filled 2012-08-28 (×2): qty 1

## 2012-08-28 MED ORDER — SODIUM CHLORIDE 0.9 % IJ SOLN
3.0000 mL | Freq: Two times a day (BID) | INTRAMUSCULAR | Status: DC
  Administered 2012-08-29 (×2): 3 mL via INTRAVENOUS

## 2012-08-28 MED ORDER — ASPIRIN 81 MG PO CHEW
324.0000 mg | CHEWABLE_TABLET | Freq: Once | ORAL | Status: AC
  Administered 2012-08-28: 324 mg via ORAL
  Filled 2012-08-28: qty 4

## 2012-08-28 MED ORDER — SPIRONOLACTONE 12.5 MG HALF TABLET: 12.5000 mg | ORAL_TABLET | Freq: Every day | ORAL | Status: DC

## 2012-08-28 MED ORDER — ENOXAPARIN SODIUM 30 MG/0.3ML ~~LOC~~ SOLN
30.0000 mg | Freq: Every day | SUBCUTANEOUS | Status: DC
  Administered 2012-08-29 (×2): 30 mg via SUBCUTANEOUS
  Filled 2012-08-28 (×3): qty 0.3

## 2012-08-28 MED ORDER — DEXTROSE 5 % IV SOLN
1.0000 g | INTRAVENOUS | Status: DC
  Administered 2012-08-29: 1 g via INTRAVENOUS
  Filled 2012-08-28 (×2): qty 10

## 2012-08-28 MED ORDER — DEXTROSE 5 % IV SOLN
1.0000 g | INTRAVENOUS | Status: DC
  Administered 2012-08-28: 1 g via INTRAVENOUS
  Filled 2012-08-28: qty 10

## 2012-08-28 MED ORDER — ISOSORBIDE MONONITRATE ER 60 MG PO TB24: 120.0000 mg | ORAL_TABLET | Freq: Every day | ORAL | Status: DC

## 2012-08-28 MED ORDER — DEXTROSE 5 % IV SOLN: 1.0000 g | INTRAVENOUS | Status: DC

## 2012-08-28 MED ORDER — HYDRALAZINE HCL 50 MG PO TABS
50.0000 mg | ORAL_TABLET | Freq: Three times a day (TID) | ORAL | Status: DC
  Filled 2012-08-28 (×2): qty 1

## 2012-08-28 MED ORDER — AZITHROMYCIN 250 MG PO TABS
500.0000 mg | ORAL_TABLET | Freq: Once | ORAL | Status: AC
  Administered 2012-08-28: 500 mg via ORAL
  Filled 2012-08-28: qty 2

## 2012-08-28 MED ORDER — ONDANSETRON HCL 4 MG/2ML IJ SOLN
4.0000 mg | Freq: Once | INTRAMUSCULAR | Status: AC
  Administered 2012-08-28: 4 mg via INTRAVENOUS
  Filled 2012-08-28: qty 2

## 2012-08-28 MED ORDER — ASPIRIN EC 81 MG PO TBEC
81.0000 mg | DELAYED_RELEASE_TABLET | Freq: Every day | ORAL | Status: DC
  Administered 2012-08-29 – 2012-08-30 (×2): 81 mg via ORAL
  Filled 2012-08-28 (×2): qty 1

## 2012-08-28 MED ORDER — AZITHROMYCIN 500 MG PO TABS: 500.0000 mg | ORAL_TABLET | ORAL | Status: DC

## 2012-08-28 MED ORDER — SIMVASTATIN 10 MG PO TABS
10.0000 mg | ORAL_TABLET | Freq: Every day | ORAL | Status: DC
  Administered 2012-08-29: 10 mg via ORAL
  Filled 2012-08-28 (×2): qty 1

## 2012-08-28 MED ORDER — OXYCODONE-ACETAMINOPHEN 5-325 MG PO TABS
1.0000 | ORAL_TABLET | ORAL | Status: DC | PRN
  Administered 2012-08-29: 2 via ORAL
  Filled 2012-08-28: qty 2

## 2012-08-28 NOTE — ED Provider Notes (Signed)
Medical screening examination/treatment/procedure(s) were performed by non-physician practitioner and as supervising physician I was immediately available for consultation/collaboration.   Gwyneth Sprout, MD 08/28/12 2328

## 2012-08-28 NOTE — ED Notes (Signed)
[id number]<HQ> Expected date:<BR> Expected time:<BR> Means of arrival:<BR> Comments:<BR> [facility] [place]

## 2012-08-28 NOTE — ED Provider Notes (Signed)
[name] S 8:20 PM patient discussed in sign out with Remi Haggard NP. Patient presenting with right lower back pain as well as some left chest discomforts. Chest x-ray demonstrates signs concerning for right lower lobe pneumonia most likely cause of patient's right lower back pain symptoms. He is also had significant coughing which may be some muscular strain to chest.   Patient did however have a very slightly elevated point-of-care troponin level at 0.09 and a more formal troponin level was sent to the lab. Patient was seen and discussed with attending physician and is felt symptoms are atypical and unlikely for ACS. He has unchanged concerning EKG apart. Plan to discharge patient home with treatment for pneumonia if lab troponin is negative.  Lab troponin test is negative. Patient continues to feel side poor. Currently at rest on room air his oxygen sats are 89%.  Spoke with triad hospitalists, Dr. Izola Price. She will see patient and admit to telemetry floor Team 8.  Angus Seller, [place] 08/28/12 2223

## 2012-08-28 NOTE — ED Provider Notes (Signed)
History     CSN: [id number]  Arrival date & time 08/28/12  1718   First MD Initiated Contact with Patient 08/28/12 1841      Chief Complaint  Patient presents with  . Emesis  . Back Pain    (Consider location/radiation/quality/duration/timing/severity/associated sxs/prior treatment) Patient is a 46 y.o. male presenting with shortness of breath. The history is provided by the patient. No language interpreter was used.  Shortness of Breath  The current episode started 3 to 5 days ago. The onset was gradual. The problem occurs continuously. The problem has been gradually worsening. The problem is moderate. Associated symptoms include chest pain, chest pressure, cough and shortness of breath. Pertinent negatives include no fever, no sore throat and no wheezing. He has had prior hospitalizations. He has been behaving normally.   46 year old male coming in with right lower back pain x4 days and worsening non radiating constant chest pain with cough x4 days. States that he has been short of breath with exertion. He took 3 nitroglycerin throughout the day today with no improvement of chest pain. Patient has a past medical history of CAD sudden onset daily MI in 2008. Last cardiac cath was 2012 which showed occluded RCA with collateral circulation and mildly diffuse non-obstructive dz.  pmh listed below.    Past Medical History  Diagnosis Date  . CAD (coronary artery disease) 12/2006    NSTEMI 2008 in the setting of profound epistaxis and anemia, last cath 2012 03/2011 (med rx)  . Hypertension   . Hyperlipidemia   . Chronic kidney disease     Average creatine of about 2.3  . Ischemic cardiomyopathy     Prior EF 25%, apical, posterior akinesis, inferior akinesis, moderate LVH, mild MR, mild decreased RV function,  65% EF 2012 echo.  . Anemia     Past Surgical History  Procedure Date  . Cardiac catheterization 2012    total RCA occlusion with left-to-right collaterals supplying the distal  RCA branches, mild diffuse nonobstructive disease of LAD and LCx. Medically managed    Family History  Problem Relation Age of Onset  . Hypertension Mother     deceased  . Diabetes Mother     deceased  . Heart disease Mother     deceased  . Heart attack Father     at age 21, deceased    History  Substance Use Topics  . Smoking status: Former Smoker -- 1.0 packs/day for 10 years    Types: Cigarettes    Quit date: 11/16/2006  . Smokeless tobacco: Never Used  . Alcohol Use: No      Review of Systems  Constitutional: Negative.  Negative for fever.  HENT: Negative.  Negative for sore throat.   Eyes: Negative.   Respiratory: Positive for cough and shortness of breath. Negative for wheezing.   Cardiovascular: Positive for chest pain.  Gastrointestinal: Positive for nausea and vomiting.  Musculoskeletal: Negative for gait problem.  Neurological: Negative.   Psychiatric/Behavioral: Negative.   All other systems reviewed and are negative.    Allergies  Review of patient's allergies indicates no known allergies.  Home Medications   Current Outpatient Rx  Name  Route  Sig  Dispense  Refill  . AMLODIPINE BESYLATE 10 MG PO TABS      TAKE 1 TABLET (10 MG TOTAL) BY MOUTH DAILY.   90 tablet   2     RX SIG: TAKE 1 TABLET (10 MG TOTAL) BY MOUTH DAILY .Marland Kitchen.   . ASPIRIN  EC 81 MG PO TBEC   Oral   Take 81 mg by mouth daily.         Marland Kitchen CARVEDILOL 25 MG PO TABS   Oral   Take 25 mg by mouth 2 (two) times daily with a meal.         . FUROSEMIDE 40 MG PO TABS      TAKE ONE TABLET BY MOUTH TWICE DAILY   180 tablet   0     E-RX RENEW ZO:XWRUEAVW MD, JAMES RENEWED FROM RX 2 ...   . HYDRALAZINE HCL 50 MG PO TABS      TAKE 1 TABLET (50 MG TOTAL) BY MOUTH 3 (THREE) TIMES DAILY.   270 tablet   2     RX SIG: TAKE 1 TABLET (50 MG TOTAL) BY MOUTH 3 (TH ...   . ISOSORBIDE MONONITRATE ER 120 MG PO TB24   Oral   Take 120 mg by mouth daily.         Marland Kitchen NITROGLYCERIN 0.4 MG  SL SUBL   Sublingual   Place 1 tablet (0.4 mg total) under the tongue every 5 (five) minutes x 3 doses as needed. For chest pain   25 tablet   3   . PRAVASTATIN SODIUM 40 MG PO TABS   Oral   Take 40 mg by mouth every evening.         Marland Kitchen RANOLAZINE ER 500 MG PO TB12   Oral   Take 1 tablet (500 mg total) by mouth 2 (two) times daily.   60 tablet   11   . SPIRONOLACTONE 25 MG PO TABS      TAKE 0.5 TABLET (12.5 MG TOTAL) BY MOUTH DAILY.   45 tablet   2     RX SIG: TAKE 0.5 TABLET (12.5 MG TOTAL) BY MOUTH D ...     BP 101/63  Pulse 58  Temp 97.8 F (36.6 C) (Oral)  Resp 18  Ht 5\' 10"  (1.778 m)  SpO2 92%  Physical Exam  Nursing note and vitals reviewed. Constitutional: He is oriented to person, place, and time. He appears well-developed and well-nourished.  HENT:  Head: Normocephalic.  Eyes: Conjunctivae normal and EOM are normal. Pupils are equal, round, and reactive to light.  Neck: Normal range of motion. Neck supple.  Cardiovascular: Normal rate, regular rhythm and normal heart sounds.   Pulmonary/Chest: Effort normal and breath sounds normal. No respiratory distress. He has no wheezes. He exhibits no tenderness.  Abdominal: Soft. Bowel sounds are normal. He exhibits no distension. There is no tenderness.  Musculoskeletal: Normal range of motion.  Neurological: He is alert and oriented to person, place, and time.  Skin: Skin is warm and dry.  Psychiatric: He has a normal mood and affect.    ED Course  Procedures (including critical care time)   Labs Reviewed  CBC  BASIC METABOLIC PANEL  URINALYSIS, ROUTINE W REFLEX MICROSCOPIC   No results found.   No diagnosis found.    MDM  4 days R lower back pain, n/v, and chest pain.  [place] cards with cath in 2012 no acute findings.  Report given to Theron Arista PA for this 46 yo male with pneumonia RLL.  Rocephen 1gm IV in the ER.  Repeat troponin I to the lab.  Theron Arista will dispo patient when labs are back.      Labs Reviewed  POCT I-STAT TROPONIN I - Abnormal; Notable for the following:    Troponin i, poc  0.09 (*)     All other components within normal limits  CBC  BASIC METABOLIC PANEL  URINALYSIS, ROUTINE W REFLEX MICROSCOPIC  TROPONIN I    Date: 08/28/2012  Rate: 63  Rhythm: normal sinus rhythm  QRS Axis: normal  Intervals: normal  ST/T Wave abnormalities: normal  Conduction Disutrbances:none  Narrative Interpretation: occ pvc  Old EKG Reviewed: unchanged     Date: 08/28/2012  Rate: 63  Rhythm: normal sinus rhythm  QRS Axis: normal  Intervals: normal  ST/T Wave abnormalities: normal  Conduction Disutrbances:none  Narrative Interpretation:   Old EKG Reviewed: unchanged       Remi Haggard, NP 08/28/12 2232

## 2012-08-28 NOTE — ED Provider Notes (Signed)
Medical screening examination/treatment/procedure(s) were performed by non-physician practitioner and as supervising physician I was immediately available for consultation/collaboration.   Gwyneth Sprout, MD 08/28/12 2330

## 2012-08-28 NOTE — ED Notes (Signed)
Per EMS pt came from home. He has been vomiting and having lower back pain that radiates around bilat flank area that is sharp stabbing pain for 4 days that has progressively gotten worse and not tolerable anymore. Pt took 3 nitro SL today when he was having chest pain and been treating himself with OTC flu medications. Pt has 20g in left hand

## 2012-08-28 NOTE — H&P (Signed)
[facility] History and Physical  [name] ION:[id number] DOB: [birth year] DOA: 08/28/2012  Referring physician: ED physician PCP: Josue Hector, MD   Chief Complaint: Shortness of breath   HPI:  Pt is 46 yo male with extensive cardiac history outlined below who presented to [facility] ED with main concern of progressively worsening shortness of breath that initially started 3 days prior to admission and has been associated with  Intermittently productive cough of yellow sputum and mostly dyspnea on exertion. Pt also reports associated right sided chest pain for which he took 3 nitroglycerins today with no significant relief. Pt reports that chest pain is non radiating and with no other specific aggravating or alleviating factors. Pt denies fevers, chills, other abdominal or urinary concerns. Pt also denies recent travel, sick contacts or exposures. Pt reports similar episodes in the past that were due to PNA.  Assessment and Plan:  Principal Problem:  *Shortness of breath - most likely related to PNA as noted per CXR, however ACS should also be ruled out given extensive cardiac history - will start pt on empiric ABX and will obtain sputum analysis - will also cycle CEs, obtain UDS, monitor on tele Active Problems:  CAD - extensive history outlined below - continue Aspirin  Chronic systolic heart failure - per last 2 D ECHO 11/2011, EF 45% - will continue Lasix 40 mg BID as per home medication regimen - daily weights and I's/O's will be monitored   CAP (community acquired pneumonia) - treatment with ABX as noted above - sputum analysis ordered  Chest pain - unclear etiology at this time and possibly related to PNA - pt has extensive cardiac history so will observe on telemetry - first set of CE is within normal limits - 12 lead EKG unremarkable with no acute ST/T wave changes  - morphine for pain and oxygen as needed, NTG as needed SL  HYPERLIPIDEMIA - continue  statin as per home medication regimen   HYPERTENSION - since pt is slightly hypotensive on arrival will hold following home medications: Norvasc, Imdur, Hydralazine, Spironolactone - will continue Lasix as Vascular congestion note on XRAY and clinical exam - will also continue Coreg with plan to hold it if BP drops  ACUTE ON CHRONIC KIDNEY DISEASE UNSPECIFIED - review of records indicate baseline creatinine 2.4 -2.5 since 03/2012 - unclear if this new rise is reflecting new baseline  - continue Lasix as per home medication regimen 40 mg BID - BMP in AM  Code Status: Full Family Communication: Pt at bedside Disposition Plan: Admit to telemetry   Review of Systems:  Constitutional: Negative for fever, chills and malaise/fatigue. Negative for diaphoresis.  HENT: Negative for hearing loss, ear pain, nosebleeds, congestion, sore throat, neck pain, tinnitus and ear discharge.   Eyes: Negative for blurred vision, double vision, photophobia, pain, discharge and redness.  Respiratory: Positive for cough, sputum production, shortness of breath, negative for wheezing and stridor.   Cardiovascular: Positive for chest pain, negative for palpitations, orthopnea, claudication and leg swelling.  Gastrointestinal: Negative for nausea, vomiting and abdominal pain. Negative for heartburn, constipation, blood in stool and melena.  Genitourinary: Negative for dysuria, urgency, frequency, hematuria and flank pain.  Musculoskeletal: Negative for myalgias, positive for chronic back pain Skin: Negative for itching and rash.  Neurological: Negative for dizziness and weakness. Negative for tingling, tremors, sensory change, speech change, focal weakness, loss of consciousness and headaches.  Endo/Heme/Allergies: Negative for environmental allergies and polydipsia. Does not bruise/bleed easily.  Psychiatric/Behavioral: Negative for  suicidal ideas. The patient is not nervous/anxious.      Past Medical History    Diagnosis Date  . CAD (coronary artery disease) 12/2006    NSTEMI 2008 in the setting of profound epistaxis and anemia, last cath 2012 03/2011 (med rx)  . Hypertension   . Hyperlipidemia   . Chronic kidney disease     Average creatine of about 2.3  . Ischemic cardiomyopathy     Prior EF 25%, apical, posterior akinesis, inferior akinesis, moderate LVH, mild MR, mild decreased RV function,  65% EF 2012 echo.  . Anemia     Past Surgical History  Procedure Date  . Cardiac catheterization 2012    total RCA occlusion with left-to-right collaterals supplying the distal RCA branches, mild diffuse nonobstructive disease of LAD and LCx. Medically managed    Social History:  reports that he quit smoking about 5 years ago. His smoking use included Cigarettes. He has a 10 pack-year smoking history. He has never used smokeless tobacco. He reports that he does not drink alcohol. His drug history not on file.  No Known Allergies  Family History  Problem Relation Age of Onset  . Hypertension Mother     deceased  . Diabetes Mother     deceased  . Heart disease Mother     deceased  . Heart attack Father     at age 60, deceased    Prior to Admission medications   Medication Sig Start Date End Date Taking? Authorizing Provider  amLODipine (NORVASC) 10 MG tablet TAKE 1 TABLET (10 MG TOTAL) BY MOUTH DAILY. 05/08/12  Yes Rollene Rotunda, MD  aspirin EC 81 MG tablet Take 81 mg by mouth daily.   Yes Historical Provider, MD  carvedilol (COREG) 25 MG tablet Take 25 mg by mouth 2 (two) times daily with a meal.   Yes Historical Provider, MD  furosemide (LASIX) 40 MG tablet TAKE ONE TABLET BY MOUTH TWICE DAILY 06/23/12  Yes Rollene Rotunda, MD  hydrALAZINE (APRESOLINE) 50 MG tablet TAKE 1 TABLET (50 MG TOTAL) BY MOUTH 3 (THREE) TIMES DAILY. 05/08/12  Yes Rollene Rotunda, MD  isosorbide mononitrate (IMDUR) 120 MG 24 hr tablet Take 120 mg by mouth daily. 10/31/11  Yes Rollene Rotunda, MD  nitroGLYCERIN (NITROSTAT)  0.4 MG SL tablet Place 1 tablet (0.4 mg total) under the tongue every 5 (five) minutes x 3 doses as needed. For chest pain 05/30/12 05/30/13 Yes Rollene Rotunda, MD  pravastatin (PRAVACHOL) 40 MG tablet Take 40 mg by mouth every evening.   Yes Historical Provider, MD  ranolazine (RANEXA) 500 MG 12 hr tablet Take 1 tablet (500 mg total) by mouth 2 (two) times daily. 03/06/12 03/06/13 Yes Rhonda G Barrett, PA  spironolactone (ALDACTONE) 25 MG tablet TAKE 0.5 TABLET (12.5 MG TOTAL) BY MOUTH DAILY. 05/08/12  Yes Rollene Rotunda, MD    Physical Exam: Filed Vitals:   08/28/12 1731 08/28/12 1736 08/28/12 1949  BP:  101/63 107/72  Pulse:  58 58  Temp:  97.8 F (36.6 C) 98 F (36.7 C)  TempSrc:  Oral Oral  Resp:  18 20  Height:  5\' 10"  (1.778 m)   SpO2: 96% 92% 93%    Physical Exam  Constitutional: Appears well-developed and well-nourished. No distress.  HENT: Normocephalic. External right and left ear normal. Oropharynx is clear and moist.  Eyes: Conjunctivae and EOM are normal. PERRLA, no scleral icterus.  Neck: Normal ROM. Neck supple. No JVD. No tracheal deviation. No thyromegaly.  CVS: Regular  rhythm, bradycardic, S1/S2 +, no murmurs, no gallops, no carotid bruit.  Pulmonary: Effort and breath sounds normal, bibasilar crackles, no rhonchi Abdominal: Soft. BS +,  no distension, tenderness, rebound or guarding.  Musculoskeletal: Normal range of motion. No edema and no tenderness.  Lymphadenopathy: No lymphadenopathy noted, cervical, inguinal. Neuro: Alert. Normal reflexes, muscle tone coordination. No cranial nerve deficit. Skin: Skin is warm and dry. No rash noted. Not diaphoretic. No erythema. No pallor.  Psychiatric: Normal mood and affect. Behavior, judgment, thought content normal.   Labs on Admission:  Basic Metabolic Panel:  Lab 08/28/12 1610  NA 134*  K 4.7  CL 97  CO2 26  GLUCOSE 126*  BUN 30*  CREATININE 3.16*  CALCIUM 9.1  MG --  PHOS --   CBC:  Lab 08/28/12 2000  WBC  5.0  NEUTROABS --  HGB 13.5  HCT 41.9  MCV 87.1  PLT 247   Cardiac Enzymes:  Lab 08/28/12 2032  CKTOTAL --  CKMB --  CKMBINDEX --  TROPONINI <0.30   Radiological Exams on Admission: Dg Chest Portable 1 View 08/28/2012   Focal right lower lung oval opacity - question atelectasis versus developing pneumonia.   Cardiomegaly with mild pulmonary vascular congestion.     EKG: Normal sinus rhythm, no ST/T wave changes  Debbora Presto, MD  [facility] Pager [phone]  If 7PM-7AM, please contact night-coverage [email or web address] Password [facility] 08/28/2012, 10:21 PM

## 2012-08-28 NOTE — ED Notes (Addendum)
Crawford,NP. made aware of critical troponin in lab.

## 2012-08-29 LAB — URINE CULTURE: Colony Count: 3000

## 2012-08-29 LAB — BASIC METABOLIC PANEL
Chloride: 96 mEq/L (ref 96–112)
Creatinine, Ser: 3.46 mg/dL — ABNORMAL HIGH (ref 0.50–1.35)
GFR calc Af Amer: 23 mL/min — ABNORMAL LOW (ref >90)
GFR calc non Af Amer: 20 mL/min — ABNORMAL LOW (ref >90)
Potassium: 4.4 mEq/L (ref 3.5–5.1)

## 2012-08-29 LAB — STREP PNEUMONIAE URINARY ANTIGEN: Strep Pneumo Urinary Antigen: NEGATIVE

## 2012-08-29 LAB — RAPID URINE DRUG SCREEN, HOSP PERFORMED
Amphetamines: NOT DETECTED
Benzodiazepines: NOT DETECTED
Opiates: POSITIVE — AB

## 2012-08-29 LAB — CBC
Platelets: 221 10*3/uL (ref 150–400)
RDW: 14.4 % (ref 11.5–15.5)
WBC: 4.6 10*3/uL (ref 4.0–10.5)

## 2012-08-29 LAB — HIV ANTIBODY (ROUTINE TESTING W REFLEX): HIV: NONREACTIVE

## 2012-08-29 MED ORDER — ISOSORBIDE MONONITRATE ER 60 MG PO TB24
120.0000 mg | ORAL_TABLET | Freq: Every day | ORAL | Status: DC
  Administered 2012-08-30: 120 mg via ORAL
  Filled 2012-08-29: qty 2

## 2012-08-29 MED ORDER — HYDRALAZINE HCL 50 MG PO TABS
50.0000 mg | ORAL_TABLET | Freq: Three times a day (TID) | ORAL | Status: DC
  Administered 2012-08-29 – 2012-08-30 (×3): 50 mg via ORAL
  Filled 2012-08-29 (×6): qty 1

## 2012-08-29 MED ORDER — SPIRONOLACTONE 12.5 MG HALF TABLET
12.5000 mg | ORAL_TABLET | Freq: Every day | ORAL | Status: DC
  Administered 2012-08-30: 12.5 mg via ORAL
  Filled 2012-08-29: qty 1

## 2012-08-29 NOTE — Progress Notes (Signed)
ANTIBIOTIC CONSULT NOTE - INITIAL  Pharmacy Consult for antibiotic monitoring Indication: pneumonia  No Known Allergies  Patient Measurements: Height: 5\' 10"  (177.8 cm) Weight: 227 lb 4.7 oz (103.1 kg) IBW/kg (Calculated) : 73  Adjusted Body Weight:   Vital Signs: Temp: 97.6 F (36.4 C) (12/27 0042) Temp src: Oral (12/27 0042) BP: 123/63 mmHg (12/27 0042) Pulse Rate: 57  (12/27 0042) Intake/Output from previous day: 12/26 0701 - 12/27 0700 In: 290 [P.O.:240; IV Piggyback:50] Out: 250 [Urine:250] Intake/Output from this shift: Total I/O In: 290 [P.O.:240; IV Piggyback:50] Out: 250 [Urine:250]  Labs:  [facility] 08/29/12 0015 08/28/12 2000  WBC 4.6 5.0  HGB 13.2 13.5  PLT 221 247  LABCREA -- --  CREATININE 3.46* 3.16*   Estimated Creatinine Clearance: 32.4 ml/min (by C-G formula based on Cr of 3.46). No results found for this basename: VANCOTROUGH:2,VANCOPEAK:2,VANCORANDOM:2,GENTTROUGH:2,GENTPEAK:2,GENTRANDOM:2,TOBRATROUGH:2,TOBRAPEAK:2,TOBRARND:2,AMIKACINPEAK:2,AMIKACINTROU:2,AMIKACIN:2, in the last 72 hours   Microbiology: No results found for this or any previous visit (from the past 720 hour(s)).  Medical History: Past Medical History  Diagnosis Date  . CAD (coronary artery disease) 12/2006    NSTEMI 2008 in the setting of profound epistaxis and anemia, last cath 2012 03/2011 (med rx)  . Hypertension   . Hyperlipidemia   . Chronic kidney disease     Average creatine of about 2.3  . Ischemic cardiomyopathy     Prior EF 25%, apical, posterior akinesis, inferior akinesis, moderate LVH, mild MR, mild decreased RV function,  65% EF 2012 echo.  . Anemia     Medications:  Anti-infectives     Start     Dose/Rate Route Frequency Ordered Stop   08/29/12 2200   azithromycin (ZITHROMAX) tablet 500 mg        500 mg Oral Daily at bedtime 08/28/12 2334 09/05/12 2159   08/29/12 2100   cefTRIAXone (ROCEPHIN) 1 g in dextrose 5 % 50 mL IVPB        1 g 100 mL/hr over 30  Minutes Intravenous Every 24 hours 08/28/12 2333 09/05/12 2059   08/28/12 2230   cefTRIAXone (ROCEPHIN) 1 g in dextrose 5 % 50 mL IVPB  Status:  Discontinued        1 g 100 mL/hr over 30 Minutes Intravenous Every 24 hours 08/28/12 2228 08/28/12 2333   08/28/12 2230   azithromycin (ZITHROMAX) tablet 500 mg  Status:  Discontinued        500 mg Oral Every 24 hours 08/28/12 2228 08/28/12 2333   08/28/12 2215   azithromycin (ZITHROMAX) tablet 500 mg        500 mg Oral  Once 08/28/12 2200 08/28/12 2206   08/28/12 2045   cefTRIAXone (ROCEPHIN) 1 g in dextrose 5 % 50 mL IVPB  Status:  Discontinued        1 g 100 mL/hr over 30 Minutes Intravenous Every 24 hours 08/28/12 2034 08/28/12 2228         Assessment: Patient with PNA.  First dose of antibiotics already given in ED.  Goal of Therapy:   Rocephin/azithromycin based on manufacturer dosing recommendations.    Plan:  Rocephin 1gm iv q24hr Azithromycin 500mg  iv q24hr Pharmacy will sign off, please consult again if needed thanks  Aleene Davidson Crowford 08/29/2012,2:11 AM

## 2012-08-29 NOTE — Progress Notes (Signed)
[facility] PROGRESS NOTE  [name] JYN:[id number] DOB: [birth year] DOA: 08/28/2012 PCP: [name] Hector, MD  Assessment/Plan: Shortness of breath  Likely related to early PNA as noted per CXR. Troponin negative, no suggestive of ACS. Continue ceftriaxone and azithromycin.  Urine strep pneumo negative.  Urine legionella pending.  HIV nonreactive.  Blood cultures x2 on 08/29/2012 pending.  Sputum culture pending.  Continue ceftriaxone and azithromycin.  Urine drug screen on 08/29/2012 positive for opiates and THC.  CAD  Stable.  Not endorsing any chest pain.  Continue aspirin.  Chronic systolic heart failure  Compensated on exam.  Continue home Lasix 40 mg BID.  Not on ACE inhibitor due to chronic kidney disease. Daily weights and I's/O's will be monitored.  CAP (community acquired pneumonia)  As indicated above, continue ceftriaxone and azithromycin.  Improved.  Consider transitioning to levofloxacin at discharge.  Chest pain  Resolved. Likely related to pneumonia and cough.  Initial troponin negative. Morphine for pain and oxygen as needed, NTG as needed SL.  No events on telemetry except few episodes of bradycardia while asleep.  Hyperlipidemia Continue statin.  Hypertension  Improved.  Resume home hydralazine, spironolactone and Imdur.  Continue to hold amlodipine.  Continue carvedilol.  Acute renal failure on chronic kidney disease stage III Review of records indicate baseline creatinine 2.1 - 2.9 since 03/2012.  Etiology unclear.  Urine analysis not suggestive of urinary tract infection.Continue Lasix as per home medication regimen 40 mg BID for now.  Continue trend in renal function.  Code Status: Full  Family Communication: Pt at bedside  Disposition Plan: Continue on telemetry  Consultants:  None  Procedures:  None  Antibiotics:  Ceftriaxone 08/29/2012 >>  Azithromycin 08/30/2012 >>  HPI/Subjective: Denies any chest pain or shortness of  breath.  Objective: Filed Vitals:   08/28/12 1949 08/28/12 2340 08/29/12 0042 08/29/12 0549  BP: 107/72  123/63 108/61  Pulse: 58  57 57  Temp: 98 F (36.7 C)  97.6 F (36.4 C) 97.4 F (36.3 C)  TempSrc: Oral  Oral Oral  Resp: 20  20 20   Height:  5\' 10"  (1.778 m)    Weight:  103.1 kg (227 lb 4.7 oz)    SpO2: 93%  99% 99%    Intake/Output Summary (Last 24 hours) at 08/29/12 1100 Last data filed at 08/29/12 0939  Gross per 24 hour  Intake   1010 ml  Output    550 ml  Net    460 ml   Filed Weights   08/28/12 2340  Weight: 103.1 kg (227 lb 4.7 oz)    Exam: Physical Exam: General: Awake, Oriented, No acute distress. HEENT: EOMI. Neck: Supple CV: S1 and S2 Lungs: Clear to ascultation bilaterally Abdomen: Soft, Nontender, Nondistended, +bowel sounds. Ext: Good pulses. Trace edema.  Data Reviewed: Basic Metabolic Panel:  Lab 08/29/12 8657 08/28/12 2000  NA 131* 134*  K 4.4 4.7  CL 96 97  CO2 24 26  GLUCOSE 168* 126*  BUN 34* 30*  CREATININE 3.46* 3.16*  CALCIUM 8.5 9.1  MG -- --  PHOS -- --   Liver Function Tests: No results found for this basename: AST:5,ALT:5,ALKPHOS:5,BILITOT:5,PROT:5,ALBUMIN:5 in the last 168 hours No results found for this basename: LIPASE:5,AMYLASE:5 in the last 168 hours No results found for this basename: AMMONIA:5 in the last 168 hours CBC:  Lab 08/29/12 0015 08/28/12 2000  WBC 4.6 5.0  NEUTROABS -- --  HGB 13.2 13.5  HCT 40.0 41.9  MCV 87.3 87.1  PLT 221  247   Cardiac Enzymes:  Lab 08/28/12 2032  CKTOTAL --  CKMB --  CKMBINDEX --  TROPONINI <0.30   BNP (last 3 results) No results found for this basename: PROBNP:3 in the last 8760 hours CBG: No results found for this basename: GLUCAP:5 in the last 168 hours  No results found for this or any previous visit (from the past 240 hour(s)).   Studies: Dg Chest Portable 1 View  08/28/2012  *RADIOLOGY REPORT*  Clinical Data: 46 year old male with shortness of breath,  fever and back pain.  PORTABLE CHEST - 1 VIEW  Comparison: 03/03/2012 and prior chest radiographs  Findings: Cardiomegaly and mild pulmonary vascular congestion again noted. A focal oval opacity overlying the right lower lung is noted - question atelectasis or possibly pneumonia. Mild left basilar atelectasis is noted. There is no evidence of pulmonary edema, pneumothorax or pleural effusion.  IMPRESSION: Focal right lower lung oval opacity - question atelectasis versus developing pneumonia.  Cardiomegaly with mild pulmonary vascular congestion.   Original Report Authenticated By: Harmon Pier, M.D.     Scheduled Meds:   . aspirin EC  81 mg Oral Daily  . azithromycin  500 mg Oral QHS  . carvedilol  25 mg Oral BID WC  . cefTRIAXone (ROCEPHIN)  IV  1 g Intravenous Q24H  . enoxaparin  30 mg Subcutaneous QHS  . furosemide  40 mg Oral BID  . ranolazine  500 mg Oral BID  . simvastatin  10 mg Oral q1800  . sodium chloride  3 mL Intravenous Q12H   Continuous Infusions:   Principal Problem:  *Shortness of breath Active Problems:  HYPERLIPIDEMIA  ANEMIA  HYPERTENSION  CAD  Chronic systolic heart failure  CHRONIC KIDNEY DISEASE UNSPECIFIED  CAP (community acquired pneumonia)  Chest pain    Time spent: 25 mins    [name] A  [facility] Pager [phone]. If 8PM-8AM, please contact night-coverage at [email or web address], password [facility] 08/29/2012, 11:00 AM  LOS: 1 day

## 2012-08-30 LAB — CBC
MCV: 87.5 fL (ref 78.0–100.0)
Platelets: 219 10*3/uL (ref 150–400)
RBC: 4.71 MIL/uL (ref 4.22–5.81)
WBC: 4.7 10*3/uL (ref 4.0–10.5)

## 2012-08-30 LAB — BASIC METABOLIC PANEL
CO2: 26 mEq/L (ref 19–32)
Chloride: 96 mEq/L (ref 96–112)
Creatinine, Ser: 2.91 mg/dL — ABNORMAL HIGH (ref 0.50–1.35)
GFR calc Af Amer: 28 mL/min — ABNORMAL LOW (ref >90)
Potassium: 3.9 mEq/L (ref 3.5–5.1)

## 2012-08-30 LAB — TROPONIN I: Troponin I: <0.3 ng/mL (ref <0.30)

## 2012-08-30 MED ORDER — LEVOFLOXACIN 750 MG PO TABS: 750.0000 mg | ORAL_TABLET | ORAL | Status: DC

## 2012-08-30 MED ORDER — LEVOFLOXACIN 750 MG PO TABS
750.0000 mg | ORAL_TABLET | ORAL | Status: DC
  Administered 2012-08-30: 750 mg via ORAL
  Filled 2012-08-30: qty 1

## 2012-08-30 NOTE — Progress Notes (Signed)
[facility] PROGRESS NOTE  [name] OZH:[id number] DOB: 01/05/1966 DOA: 08/28/2012 PCP: Josue Hector, MD  Assessment/Plan: Shortness of breath  Likely related to early PNA as noted per CXR. Troponin negative x3, not suggestive of ACS. Transition ceftriaxone and azithromycin to levofloxacin for 5 more to complete 7 day course of antibiotics. Urine strep pneumo negative.  Urine legionella pending.  HIV nonreactive.  Blood cultures x2 on 08/29/2012 showed no growth to date.  Sputum culture pending.  Urine drug screen on 08/29/2012 positive for opiates and THC. (Discussed with patient about THC + UDS, patient denies using mariajuana use, but note that he has friends who smoke mariajuana, counseled on avoidence).  CAD  Stable.  Not endorsing any chest pain.  Continue aspirin.  Chronic systolic heart failure  Compensated on exam.  Continue home Lasix 40 mg BID.  Not on ACE inhibitor due to chronic kidney disease. Daily weights and I's/O's will be monitored.  CAP (community acquired pneumonia)  As indicated above, Transition to levofloxacin at discharge.  Chest pain  Resolved. Likely related to pneumonia and cough.  Troponin negative x3. Morphine for pain and oxygen as needed, NTG as needed SL.  No events on telemetry except few episodes of bradycardia while asleep and PVCs, bradycardia resolved in the last 24 hours.  Hyperlipidemia Continue statin.  Hypertension  Improved.  Resume home hydralazine, spironolactone and Imdur.  Resume amlodipine at discharge.  Continue carvedilol.  Acute renal failure on chronic kidney disease stage III Improved and at baseline prior to discharge. Review of records indicate baseline creatinine 2.1 - 2.9 since 03/2012.  Urine analysis not suggestive of urinary tract infection. Continue Lasix as per home medication regimen 40 mg BID.  Code Status: Full  Family Communication: Pt at bedside  Disposition Plan: Continue on  telemetry  Consultants:  None  Procedures:  None  Antibiotics:  Ceftriaxone 08/29/2012 >> 08/30/2012  Azithromycin 08/30/2012 >> 08/30/2012  Levofloxacin 08/30/2012 >> Till 09/05/2012  HPI/Subjective: Breathing better, eager to go home. Breathing improved. Denies any chest pain.  Objective: Filed Vitals:   08/29/12 0549 08/29/12 1423 08/29/12 2200 08/30/12 0600  BP: 108/61 149/80 133/86 142/91  Pulse: 57 59 60 67  Temp: 97.4 F (36.3 C) 98.4 F (36.9 C) 98.3 F (36.8 C) 98.5 F (36.9 C)  TempSrc: Oral Oral Oral Oral  Resp: 20 20 18 20   Height:      Weight:    101.7 kg (224 lb 3.3 oz)  SpO2: 99% 97% 93% 96%    Intake/Output Summary (Last 24 hours) at 08/30/12 1051 Last data filed at 08/30/12 0831  Gross per 24 hour  Intake    240 ml  Output   3425 ml  Net  -3185 ml   Filed Weights   08/28/12 2340 08/30/12 0600  Weight: 103.1 kg (227 lb 4.7 oz) 101.7 kg (224 lb 3.3 oz)    Exam: Physical Exam: General: Awake, Oriented, No acute distress. HEENT: EOMI. Neck: Supple CV: S1 and S2 Lungs: Clear to ascultation bilaterally Abdomen: Soft, Nontender, Nondistended, +bowel sounds. Ext: Good pulses. Trace edema.  Data Reviewed: Basic Metabolic Panel:  Lab 08/30/12 6295 08/29/12 0015 08/28/12 2000  NA 134* 131* 134*  K 3.9 4.4 4.7  CL 96 96 97  CO2 26 24 26   GLUCOSE 117* 168* 126*  BUN 33* 34* 30*  CREATININE 2.91* 3.46* 3.16*  CALCIUM 8.7 8.5 9.1  MG -- -- --  PHOS -- -- --   Liver Function Tests: No results found  for this basename: AST:5,ALT:5,ALKPHOS:5,BILITOT:5,PROT:5,ALBUMIN:5 in the last 168 hours No results found for this basename: LIPASE:5,AMYLASE:5 in the last 168 hours No results found for this basename: AMMONIA:5 in the last 168 hours CBC:  Lab 08/30/12 0527 08/29/12 0015 08/28/12 2000  WBC 4.7 4.6 5.0  NEUTROABS -- -- --  HGB 13.9 13.2 13.5  HCT 41.2 40.0 41.9  MCV 87.5 87.3 87.1  PLT 219 221 247   Cardiac Enzymes:  Lab 08/30/12  0527 08/28/12 2032  CKTOTAL -- --  CKMB -- --  CKMBINDEX -- --  TROPONINI <0.30 <0.30   BNP (last 3 results) No results found for this basename: PROBNP:3 in the last 8760 hours CBG: No results found for this basename: GLUCAP:5 in the last 168 hours  Recent Results (from the past 240 hour(s))  URINE CULTURE     Status: Normal   Collection Time   08/28/12  9:01 PM      Component Value Range Status Comment   Specimen Description URINE, CLEAN CATCH   Final    Special Requests NONE   Final    Culture  Setup Time 08/29/2012 03:12   Final    Colony Count 3,000 COLONIES/ML   Final    Culture INSIGNIFICANT GROWTH   Final    Report Status 08/29/2012 FINAL   Final      Studies: Dg Chest Portable 1 View  08/28/2012  *RADIOLOGY REPORT*  Clinical Data: 46 year old male with shortness of breath, fever and back pain.  PORTABLE CHEST - 1 VIEW  Comparison: 03/03/2012 and prior chest radiographs  Findings: Cardiomegaly and mild pulmonary vascular congestion again noted. A focal oval opacity overlying the right lower lung is noted - question atelectasis or possibly pneumonia. Mild left basilar atelectasis is noted. There is no evidence of pulmonary edema, pneumothorax or pleural effusion.  IMPRESSION: Focal right lower lung oval opacity - question atelectasis versus developing pneumonia.  Cardiomegaly with mild pulmonary vascular congestion.   Original Report Authenticated By: Harmon Pier, M.D.     Scheduled Meds:    . aspirin EC  81 mg Oral Daily  . azithromycin  500 mg Oral QHS  . carvedilol  25 mg Oral BID WC  . cefTRIAXone (ROCEPHIN)  IV  1 g Intravenous Q24H  . enoxaparin  30 mg Subcutaneous QHS  . furosemide  40 mg Oral BID  . hydrALAZINE  50 mg Oral Q8H  . isosorbide mononitrate  120 mg Oral Daily  . ranolazine  500 mg Oral BID  . simvastatin  10 mg Oral q1800  . sodium chloride  3 mL Intravenous Q12H  . spironolactone  12.5 mg Oral Daily   Continuous Infusions:   Principal  Problem:  *Shortness of breath Active Problems:  HYPERLIPIDEMIA  ANEMIA  HYPERTENSION  CAD  Chronic systolic heart failure  CHRONIC KIDNEY DISEASE UNSPECIFIED  CAP (community acquired pneumonia)  Chest pain    Time spent: 25 mins    [name]  [facility] Pager [phone]. If 8PM-8AM, please contact night-coverage at [email or web address], password [facility] 08/30/2012, 10:51 AM  LOS: 2 days

## 2012-08-30 NOTE — Discharge Summary (Signed)
Physician Discharge Summary  [name] UJW:[id number] DOB: 07-12-1966 DOA: 08/28/2012  PCP: [name] Hector, MD  Admit date: 08/28/2012 Discharge date: 08/30/2012  Time spent: 25 minutes  Recommendations for Outpatient Follow-up:  Please followup with [name] Hector, MD (PCP) in 1 week.  Discharge Diagnoses:  Principal Problem:  *Shortness of breath Active Problems:  HYPERLIPIDEMIA  ANEMIA  HYPERTENSION  CAD  Chronic systolic heart failure  CHRONIC KIDNEY DISEASE UNSPECIFIED  CAP (community acquired pneumonia)  Chest pain   Discharge Condition: Stable  Diet recommendation: Heart healthy diet  Filed Weights   08/28/12 2340 08/30/12 0600  Weight: 103.1 kg (227 lb 4.7 oz) 101.7 kg (224 lb 3.3 oz)    History of present illness:  Pt is 46 yo male with extensive cardiac history outlined below who presented to [facility] ED with main concern of progressively worsening shortness of breath that initially started 3 days prior to admission and has been associated with Intermittently productive cough of yellow sputum and mostly dyspnea on exertion.   Hospital Course:  Shortness of breath  Likely related to early PNA as noted per CXR. Troponin negative x3, not suggestive of ACS. Transition ceftriaxone and azithromycin to levofloxacin for 5 more to complete 7 day course of antibiotics. Urine strep pneumo negative.  Urine legionella pending.  HIV nonreactive.  Blood cultures x2 on 08/29/2012 showed no growth to date.  Sputum culture pending.  Urine drug screen on 08/29/2012 positive for opiates and THC. (Discussed with patient about THC + UDS, patient denies using mariajuana use, but note that he has friends who smoke mariajuana, counseled on avoidence).  CAD  Stable.  Not endorsing any chest pain during the hospital say.  Continue aspirin.  Chronic systolic heart failure  Compensated on exam.  Continue home Lasix 40 mg BID.  Not on ACE inhibitor due to chronic kidney  disease.   CAP (community acquired pneumonia)  As indicated above, Transition to levofloxacin at discharge.  Chest pain  Prior to admission. Likely related to pneumonia and cough.  Troponin negative x3. Morphine for pain and oxygen as needed, NTG as needed SL.  No events on telemetry except few episodes of bradycardia while asleep and PVCs, bradycardia resolved in the last 24 hours.  Hyperlipidemia Continue statin.  Hypertension  Improved.  Resume home hydralazine, spironolactone and Imdur.  Resume amlodipine at discharge.  Continue carvedilol.  Acute renal failure on chronic kidney disease stage III Improved and at baseline prior to discharge. Review of records indicate baseline creatinine 2.1 - 2.9 since 03/2012.  Urine analysis not suggestive of urinary tract infection. Continue Lasix as per home medication regimen 40 mg BID.  Code Status: Full   Consultants:  None  Procedures:  None  Antibiotics:  Ceftriaxone 08/29/2012 >> 08/30/2012  Azithromycin 08/30/2012 >> 08/30/2012  Levofloxacin 08/30/2012 >> Till 09/05/2012  Discharge Exam: Filed Vitals:   08/29/12 0549 08/29/12 1423 08/29/12 2200 08/30/12 0600  BP: 108/61 149/80 133/86 142/91  Pulse: 57 59 60 67  Temp: 97.4 F (36.3 C) 98.4 F (36.9 C) 98.3 F (36.8 C) 98.5 F (36.9 C)  TempSrc: Oral Oral Oral Oral  Resp: 20 20 18 20   Height:      Weight:    101.7 kg (224 lb 3.3 oz)  SpO2: 99% 97% 93% 96%   Discharge Instructions  Discharge Orders    Future Orders Please Complete By Expires   Diet - low sodium heart healthy      Increase activity slowly  Discharge instructions      Comments:   Please followup with [name] Hector, MD (PCP) in 1 week.       Medication List     As of 08/30/2012 11:44 AM    TAKE these medications         amLODipine 10 MG tablet   Commonly known as: NORVASC   TAKE 1 TABLET (10 MG TOTAL) BY MOUTH DAILY.      aspirin EC 81 MG tablet   Take 81 mg by mouth daily.        carvedilol 25 MG tablet   Commonly known as: COREG   Take 25 mg by mouth 2 (two) times daily with a meal.      furosemide 40 MG tablet   Commonly known as: LASIX   TAKE ONE TABLET BY MOUTH TWICE DAILY      hydrALAZINE 50 MG tablet   Commonly known as: APRESOLINE   TAKE 1 TABLET (50 MG TOTAL) BY MOUTH 3 (THREE) TIMES DAILY.      isosorbide mononitrate 120 MG 24 hr tablet   Commonly known as: IMDUR   Take 120 mg by mouth daily.      levofloxacin 750 MG tablet   Commonly known as: LEVAQUIN   Take 1 tablet (750 mg total) by mouth every other day. Start on 09/01/2012 (received a dose in the hospital on 08/30/2012).   Start taking on: 09/01/2012      nitroGLYCERIN 0.4 MG SL tablet   Commonly known as: NITROSTAT   Place 1 tablet (0.4 mg total) under the tongue every 5 (five) minutes x 3 doses as needed. For chest pain      pravastatin 40 MG tablet   Commonly known as: PRAVACHOL   Take 40 mg by mouth every evening.      ranolazine 500 MG 12 hr tablet   Commonly known as: RANEXA   Take 1 tablet (500 mg total) by mouth 2 (two) times daily.      spironolactone 25 MG tablet   Commonly known as: ALDACTONE   TAKE 0.5 TABLET (12.5 MG TOTAL) BY MOUTH DAILY.          The results of significant diagnostics from this hospitalization (including imaging, microbiology, ancillary and laboratory) are listed below for reference.    Significant Diagnostic Studies: Dg Chest Portable 1 View  08/28/2012  *RADIOLOGY REPORT*  Clinical Data: 46 year old male with shortness of breath, fever and back pain.  PORTABLE CHEST - 1 VIEW  Comparison: 03/03/2012 and prior chest radiographs  Findings: Cardiomegaly and mild pulmonary vascular congestion again noted. A focal oval opacity overlying the right lower lung is noted - question atelectasis or possibly pneumonia. Mild left basilar atelectasis is noted. There is no evidence of pulmonary edema, pneumothorax or pleural effusion.  IMPRESSION: Focal  right lower lung oval opacity - question atelectasis versus developing pneumonia.  Cardiomegaly with mild pulmonary vascular congestion.   Original Report Authenticated By: Harmon Pier, M.D.     Microbiology: Recent Results (from the past 240 hour(s))  URINE CULTURE     Status: Normal   Collection Time   08/28/12  9:01 PM      Component Value Range Status Comment   Specimen Description URINE, CLEAN CATCH   Final    Special Requests NONE   Final    Culture  Setup Time 08/29/2012 03:12   Final    Colony Count 3,000 COLONIES/ML   Final    Culture INSIGNIFICANT GROWTH  Final    Report Status 08/29/2012 FINAL   Final      Labs: Basic Metabolic Panel:  Lab 08/30/12 4098 08/29/12 0015 08/28/12 2000  NA 134* 131* 134*  K 3.9 4.4 4.7  CL 96 96 97  CO2 26 24 26   GLUCOSE 117* 168* 126*  BUN 33* 34* 30*  CREATININE 2.91* 3.46* 3.16*  CALCIUM 8.7 8.5 9.1  MG -- -- --  PHOS -- -- --   Liver Function Tests: No results found for this basename: AST:5,ALT:5,ALKPHOS:5,BILITOT:5,PROT:5,ALBUMIN:5 in the last 168 hours No results found for this basename: LIPASE:5,AMYLASE:5 in the last 168 hours No results found for this basename: AMMONIA:5 in the last 168 hours CBC:  Lab 08/30/12 0527 08/29/12 0015 08/28/12 2000  WBC 4.7 4.6 5.0  NEUTROABS -- -- --  HGB 13.9 13.2 13.5  HCT 41.2 40.0 41.9  MCV 87.5 87.3 87.1  PLT 219 221 247   Cardiac Enzymes:  Lab 08/30/12 0527 08/28/12 2032  CKTOTAL -- --  CKMB -- --  CKMBINDEX -- --  TROPONINI <0.30 <0.30   BNP: BNP (last 3 results) No results found for this basename: PROBNP:3 in the last 8760 hours CBG: No results found for this basename: GLUCAP:5 in the last 168 hours     Signed:  Elinore Shults A  [facility] 08/30/2012, 11:44 AM

## 2012-08-31 LAB — LEGIONELLA ANTIGEN, URINE

## 2012-09-04 LAB — CULTURE, BLOOD (ROUTINE X 2): Culture: NO GROWTH

## 2012-09-22 ENCOUNTER — Other Ambulatory Visit: Payer: Self-pay | Admitting: Cardiology

## 2012-09-24 ENCOUNTER — Ambulatory Visit (INDEPENDENT_AMBULATORY_CARE_PROVIDER_SITE_OTHER): Payer: Medicaid Other | Admitting: Cardiology

## 2012-09-24 ENCOUNTER — Encounter: Payer: Self-pay | Admitting: Cardiology

## 2012-09-24 VITALS — BP 180/125 | HR 65 | Ht 70.0 in | Wt 229.0 lb

## 2012-09-24 DIAGNOSIS — I5022 Chronic systolic (congestive) heart failure: Secondary | ICD-10-CM

## 2012-09-24 DIAGNOSIS — I10 Essential (primary) hypertension: Secondary | ICD-10-CM

## 2012-09-24 DIAGNOSIS — I498 Other specified cardiac arrhythmias: Secondary | ICD-10-CM

## 2012-09-24 DIAGNOSIS — I2510 Atherosclerotic heart disease of native coronary artery without angina pectoris: Secondary | ICD-10-CM

## 2012-09-24 DIAGNOSIS — R001 Bradycardia, unspecified: Secondary | ICD-10-CM

## 2012-09-24 DIAGNOSIS — R55 Syncope and collapse: Secondary | ICD-10-CM

## 2012-09-24 NOTE — Patient Instructions (Addendum)
The current medical regimen is effective;  continue present plan and medications.  Your physician has requested that you have a lexiscan myoview. For further information please visit [email or web address]. Please follow instruction sheet, as given.  Follow up in 6 months with Dr Hochrein.  You will receive a letter in the mail 2 months before you are due.  Please call us when you receive this letter to schedule your follow up appointment.  

## 2012-09-24 NOTE — Progress Notes (Signed)
HPI The patient presents for followup of his coronary disease and difficult to control hypertension. He was recently admitted for treatment of pneumonia.  I reviewed these records. As is always the case his blood pressure was well controlled in the hospital. He presents for routine followup and again his blood pressure is elevated. He reports that he has taken his medications.  He is tearful today.  He reports a refill of stress and an ongoing difficult social situation.  He is staying with a friend and is otherwise homeless.  He is struggling to get disability.  He continues to have chest discomfort. I reviewed the March 2013 stress test. I reviewed his July 2012 catheterization. 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 It is difficult to assess as chest discomfort though he thinks he is having more of this in his left arm and chest. He reports using nitroglycerin sometimes 2 or 3 times per day. He reports dyspnea with exertion but not PND or orthopnea. Unfortunately he has progressive renal insufficiency related to his hypertension and he is following with nephrology.    No Known Allergies  Current Outpatient Prescriptions  Medication Sig Dispense Refill  . amLODipine (NORVASC) 10 MG tablet TAKE 1 TABLET (10 MG TOTAL) BY MOUTH DAILY.  90 tablet  2  . aspirin EC 81 MG tablet Take 81 mg by mouth daily.      . carvedilol (COREG) 25 MG tablet Take 25 mg by mouth 2 (two) times daily with a meal.      . furosemide (LASIX) 40 MG tablet TAKE ONE TABLET BY MOUTH TWICE DAILY  180 tablet  0  . hydrALAZINE (APRESOLINE) 50 MG tablet TAKE 1 TABLET (50 MG TOTAL) BY MOUTH 3 (THREE) TIMES DAILY.  270 tablet  2  . isosorbide mononitrate (IMDUR) 120 MG 24 hr tablet Take 120 mg by mouth daily.      Marland Kitchen levofloxacin (LEVAQUIN) 750 MG tablet Take 1 tablet (750 mg total) by mouth every other day. Start on 09/01/2012 (received a dose in the hospital on 08/30/2012).  3 tablet  0  . NITROSTAT  0.4 MG SL tablet Place 1 tablet (0.4 mg total) under the tongue every 5 (five) minutes x 3 doses as needed. For chest pain  25 tablet  2  . pravastatin (PRAVACHOL) 40 MG tablet Take 40 mg by mouth every evening.      . ranolazine (RANEXA) 500 MG 12 hr tablet Take 1 tablet (500 mg total) by mouth 2 (two) times daily.  60 tablet  11  . spironolactone (ALDACTONE) 25 MG tablet TAKE 0.5 TABLET (12.5 MG TOTAL) BY MOUTH DAILY.  45 tablet  2    Past Medical History  Diagnosis Date  . CAD (coronary artery disease) 12/2006    NSTEMI 2008 in the setting of profound epistaxis and anemia, last cath 2012 03/2011 (med rx)  . Hypertension   . Hyperlipidemia   . Chronic kidney disease     Average creatine of about 2.3  . Ischemic cardiomyopathy     Prior EF 25%, apical, posterior akinesis, inferior akinesis, moderate LVH, mild MR, mild decreased RV function,  65% EF 2012 echo.  . Anemia     Past Surgical History  Procedure Date  . Cardiac catheterization 2012    total RCA occlusion with left-to-right collaterals supplying the distal RCA branches, mild diffuse nonobstructive disease of LAD and LCx. Medically managed    ROS:  As stated in the  HPI and negative for all other systems.  PHYSICAL EXAM BP 180/125  Pulse 65  Ht 5\' 10"  (1.778 m)  Wt 229 lb (103.874 kg)  BMI 32.86 kg/m2 GENERAL:  Well appearing NECK:  No jugular venous distention, waveform within normal limits, carotid upstroke brisk and symmetric, no bruits, no thyromegaly LUNGS:  Clear to auscultation bilaterally BACK:  No CVA tenderness CHEST:  Unremarkable HEART:  PMI not displaced or sustained,S1 and S2 within normal limits, no S3, no S4, no clicks, no rubs, no murmurs ABD:  Flat, positive bowel sounds normal in frequency in pitch, no bruits, no rebound, no guarding, no midline pulsatile mass, no hepatomegaly, no splenomegaly EXT:  2 plus pulses throughout, no edema, no cyanosis no clubbing  EKG:  Sinus rhythm, rate 65, axis within  normal limits, RSR prime V1 and V2, QTC prolonged, poor anterior R wave progression, no acute ST-T wave changes. Inferolateral T wave inversions more pronounced in his most recent EKG but evident on previous EKGs. 09/24/2012   ASSESSMENT AND PLAN  CAD -  This is a very difficult situation. I would not want to do a cardiac catheterization unless the high risk findings on stress test. I'm going to have him try to come back for an exercise perfusion study to compare to previous. He will otherwise continue the meds as listed.   CHRONIC SYSTOLIC HEART FAILURE -  He seems to be euvolemic. His ejection fraction is mildly reduced. For now he will continue the meds as listed.  CHRONIC KIDNEY DISEASE UNSPECIFIED - He is followed also by nephrology.  HYPERTENSION - His blood pressure is not controlled. However, I am convinced this is related to noncompliance as he is always easily treatable with his oral regimen when he is given his medications in the hospital. Therefore, at this point I would not suggest yet another change to his regimen.   SOCIAL - This continues to be I think his most significant problem. I have asked him to discuss any possibility of depression and anxiety with Josue Hector, MD

## 2012-09-30 ENCOUNTER — Encounter (HOSPITAL_COMMUNITY): Payer: Medicaid Other

## 2012-10-01 ENCOUNTER — Encounter (HOSPITAL_COMMUNITY): Payer: Medicaid Other

## 2012-10-15 ENCOUNTER — Telehealth (HOSPITAL_COMMUNITY): Payer: Self-pay

## 2012-10-15 NOTE — Telephone Encounter (Signed)
Attempted to notify the patient that our office will be closed tomorrow (10-16-12) due to bad weather, but the only phone number available was an emergency contact number that is not in service. Irean Hong, RN

## 2012-10-16 ENCOUNTER — Encounter (HOSPITAL_COMMUNITY): Payer: Medicaid Other

## 2012-11-19 ENCOUNTER — Other Ambulatory Visit: Payer: Self-pay

## 2012-11-19 MED ORDER — ISOSORBIDE MONONITRATE ER 120 MG PO TB24: 120.0000 mg | ORAL_TABLET | Freq: Every day | ORAL | Status: DC

## 2012-11-19 NOTE — Telephone Encounter (Signed)
..   Requested Prescriptions   Pending Prescriptions Disp Refills  . isosorbide mononitrate (IMDUR) 120 MG 24 hr tablet 30 tablet 4    Sig: Take 1 tablet (120 mg total) by mouth daily.

## 2012-12-16 ENCOUNTER — Other Ambulatory Visit: Payer: Self-pay

## 2012-12-16 MED ORDER — FUROSEMIDE 40 MG PO TABS: 40.0000 mg | ORAL_TABLET | Freq: Two times a day (BID) | ORAL | Status: DC

## 2012-12-16 NOTE — Telephone Encounter (Signed)
..   Requested Prescriptions   Signed Prescriptions Disp Refills  . furosemide (LASIX) 40 MG tablet 240 tablet 3    Sig: Take 1 tablet (40 mg total) by mouth 2 (two) times daily.    Authorizing Provider: Rollene Rotunda    Ordering User: Christella Hartigan, Kathlyne Loud Judie Petit

## 2013-01-12 ENCOUNTER — Other Ambulatory Visit: Payer: Self-pay

## 2013-01-12 MED ORDER — NITROGLYCERIN 0.4 MG SL SUBL: SUBLINGUAL_TABLET | SUBLINGUAL | Status: DC

## 2013-01-12 MED ORDER — PRAVASTATIN SODIUM 40 MG PO TABS: 40.0000 mg | ORAL_TABLET | Freq: Every evening | ORAL | Status: DC

## 2013-01-12 NOTE — Telephone Encounter (Signed)
..   Requested Prescriptions   Signed Prescriptions Disp Refills  . pravastatin (PRAVACHOL) 40 MG tablet 30 tablet 6    Sig: Take 1 tablet (40 mg total) by mouth every evening.    Authorizing Provider: HOCHREIN, JAMES    Ordering User: Kendale Rembold M   

## 2013-01-12 NOTE — Telephone Encounter (Signed)
..   Requested Prescriptions   Signed Prescriptions Disp Refills  . nitroGLYCERIN (NITROSTAT) 0.4 MG SL tablet 25 tablet 2    Sig: Place 1 tablet (0.4 mg total) under the tongue every 5 (five) minutes x 3 doses as needed. For chest pain    Authorizing Provider: Rollene Rotunda    Ordering User: Christella Hartigan, Mariette Cowley Judie Petit

## 2013-03-11 ENCOUNTER — Other Ambulatory Visit: Payer: Self-pay | Admitting: *Deleted

## 2013-03-11 MED ORDER — CARVEDILOL 25 MG PO TABS: 25.0000 mg | ORAL_TABLET | Freq: Two times a day (BID) | ORAL | Status: DC

## 2013-03-11 MED ORDER — RANOLAZINE ER 500 MG PO TB12: 500.0000 mg | ORAL_TABLET | Freq: Two times a day (BID) | ORAL | Status: DC

## 2013-04-15 ENCOUNTER — Other Ambulatory Visit: Payer: Self-pay

## 2013-04-15 ENCOUNTER — Encounter: Payer: Self-pay | Admitting: Cardiology

## 2013-04-15 ENCOUNTER — Ambulatory Visit (INDEPENDENT_AMBULATORY_CARE_PROVIDER_SITE_OTHER): Payer: Medicaid Other | Admitting: Cardiology

## 2013-04-15 VITALS — BP 155/99 | HR 74 | Ht 70.0 in | Wt 236.0 lb

## 2013-04-15 DIAGNOSIS — I2510 Atherosclerotic heart disease of native coronary artery without angina pectoris: Secondary | ICD-10-CM

## 2013-04-15 DIAGNOSIS — I5022 Chronic systolic (congestive) heart failure: Secondary | ICD-10-CM

## 2013-04-15 MED ORDER — NITROGLYCERIN 0.4 MG SL SUBL: SUBLINGUAL_TABLET | SUBLINGUAL | Status: DC

## 2013-04-15 MED ORDER — ISOSORBIDE MONONITRATE ER 120 MG PO TB24: 120.0000 mg | ORAL_TABLET | Freq: Every day | ORAL | Status: DC

## 2013-04-15 NOTE — Patient Instructions (Addendum)
Your physician wants you to follow-up in: 1 YEAR WITH DR. HOCHREIN IN [place]. You will receive a reminder letter in the mail two months in advance. If you don't receive a letter, please call our office to schedule the follow-up appointment.   NO CHANGES WERE MADE TODAY

## 2013-04-15 NOTE — Progress Notes (Signed)
HPI The patient presents for followup of his coronary disease and difficult to control hypertension. He has progressive renal insufficiency related to his hypertension and he is following with nephrology.  He was to have a stress perfusion study at the last visit. This was canceled because of weather and never rescheduled.  However since that time his social situation is much better. With that his blood pressure is better controlled. He is having no symptoms. In particular his not having any chest discomfort or shortness of breath. He started to do little activity such as walking.  No Known Allergies  Current Outpatient Prescriptions  Medication Sig Dispense Refill  . amLODipine (NORVASC) 10 MG tablet TAKE 1 TABLET (10 MG TOTAL) BY MOUTH DAILY.  90 tablet  2  . aspirin EC 81 MG tablet Take 81 mg by mouth daily.      . carvedilol (COREG) 25 MG tablet Take 1 tablet (25 mg total) by mouth 2 (two) times daily with a meal.  60 tablet  1  . furosemide (LASIX) 40 MG tablet Take 1 tablet (40 mg total) by mouth 2 (two) times daily.  240 tablet  3  . hydrALAZINE (APRESOLINE) 50 MG tablet TAKE 1 TABLET (50 MG TOTAL) BY MOUTH 3 (THREE) TIMES DAILY.  270 tablet  2  . isosorbide mononitrate (IMDUR) 120 MG 24 hr tablet Take 1 tablet (120 mg total) by mouth daily.  30 tablet  4  . levofloxacin (LEVAQUIN) 750 MG tablet Take 1 tablet (750 mg total) by mouth every other day. Start on 09/01/2012 (received a dose in the hospital on 08/30/2012).  3 tablet  0  . nitroGLYCERIN (NITROSTAT) 0.4 MG SL tablet Place 1 tablet (0.4 mg total) under the tongue every 5 (five) minutes x 3 doses as needed. For chest pain  25 tablet  2  . pravastatin (PRAVACHOL) 40 MG tablet Take 1 tablet (40 mg total) by mouth every evening.  30 tablet  6  . ranolazine (RANEXA) 500 MG 12 hr tablet Take 1 tablet (500 mg total) by mouth 2 (two) times daily.  60 tablet  1  . spironolactone (ALDACTONE) 25 MG tablet TAKE 0.5 TABLET (12.5 MG TOTAL) BY  MOUTH DAILY.  45 tablet  2   No current facility-administered medications for this visit.    Past Medical History  Diagnosis Date  . CAD (coronary artery disease) 12/2006    NSTEMI 2008 in the setting of profound epistaxis and anemia, last cath 2012 03/2011 (med rx)  . Hypertension   . Hyperlipidemia   . Chronic kidney disease     Average creatine of about 2.3  . Ischemic cardiomyopathy     Prior EF 25%, apical, posterior akinesis, inferior akinesis, moderate LVH, mild MR, mild decreased RV function,  65% EF 2012 echo.  . Anemia     Past Surgical History  Procedure Laterality Date  . Cardiac catheterization  2012    total RCA occlusion with left-to-right collaterals supplying the distal RCA branches, mild diffuse nonobstructive disease of LAD and LCx. Medically managed    ROS:  As stated in the HPI and negative for all other systems.  PHYSICAL EXAM BP 155/99  Pulse 74  Ht 5\' 10"  (1.778 m)  Wt 236 lb (107.049 kg)  BMI 33.86 kg/m2 GENERAL:  Well appearing NECK:  No jugular venous distention, waveform within normal limits, carotid upstroke brisk and symmetric, no bruits, no thyromegaly LUNGS:  Clear to auscultation bilaterally BACK:  No CVA tenderness  CHEST:  Unremarkable HEART:  PMI not displaced or sustained,S1 and S2 within normal limits, no S3, no S4, no clicks, no rubs, no murmurs ABD:  Flat, positive bowel sounds normal in frequency in pitch, no bruits, no rebound, no guarding, no midline pulsatile mass, no hepatomegaly, no splenomegaly EXT:  2 plus pulses throughout, no edema, no cyanosis no clubbing  EKG:  Sinus rhythm, rate 73, axis within normal limits, RSR prime V1 and V2, QTC prolonged, poor anterior R wave progression, no acute ST-T wave changes. 04/15/2013   ASSESSMENT AND PLAN  CAD -  Given the absence of symptoms no change in therapy is indicated. No further imaging is indicated.  CHRONIC SYSTOLIC HEART FAILURE -  He seems to be euvolemic. His ejection  fraction is mildly reduced. For now he will continue the meds as listed.  CHRONIC KIDNEY DISEASE UNSPECIFIED - He is followed also by nephrology.  HYPERTENSION - His blood pressure is much better controlled. I think this is because of the social situation is better and he is taking his medication

## 2013-05-09 ENCOUNTER — Encounter (HOSPITAL_COMMUNITY): Payer: Self-pay | Admitting: *Deleted

## 2013-05-09 ENCOUNTER — Encounter (HOSPITAL_COMMUNITY): Payer: Self-pay

## 2013-05-09 ENCOUNTER — Other Ambulatory Visit: Payer: Self-pay

## 2013-05-09 ENCOUNTER — Inpatient Hospital Stay (HOSPITAL_COMMUNITY)
Admission: EM | Admit: 2013-05-09 | Discharge: 2013-05-15 | DRG: 193 | Disposition: A | Discharge status: Home / Self Care | Payer: Medicare Other | Attending: Internal Medicine | Admitting: Internal Medicine

## 2013-05-09 ENCOUNTER — Ambulatory Visit (HOSPITAL_COMMUNITY): Admit: 2013-05-09 | Payer: Self-pay | Admitting: Cardiovascular Disease

## 2013-05-09 DIAGNOSIS — I200 Unstable angina: Secondary | ICD-10-CM

## 2013-05-09 DIAGNOSIS — I129 Hypertensive chronic kidney disease with stage 1 through stage 4 chronic kidney disease, or unspecified chronic kidney disease: Secondary | ICD-10-CM | POA: Diagnosis present

## 2013-05-09 DIAGNOSIS — R55 Syncope and collapse: Secondary | ICD-10-CM

## 2013-05-09 DIAGNOSIS — R079 Chest pain, unspecified: Secondary | ICD-10-CM

## 2013-05-09 DIAGNOSIS — I471 Supraventricular tachycardia, unspecified: Secondary | ICD-10-CM

## 2013-05-09 DIAGNOSIS — A419 Sepsis, unspecified organism: Secondary | ICD-10-CM

## 2013-05-09 DIAGNOSIS — Z87891 Personal history of nicotine dependence: Secondary | ICD-10-CM

## 2013-05-09 DIAGNOSIS — R519 Headache, unspecified: Secondary | ICD-10-CM

## 2013-05-09 DIAGNOSIS — R0602 Shortness of breath: Secondary | ICD-10-CM

## 2013-05-09 DIAGNOSIS — I5023 Acute on chronic systolic (congestive) heart failure: Secondary | ICD-10-CM

## 2013-05-09 DIAGNOSIS — J189 Pneumonia, unspecified organism: Principal | ICD-10-CM

## 2013-05-09 DIAGNOSIS — I10 Essential (primary) hypertension: Secondary | ICD-10-CM

## 2013-05-09 DIAGNOSIS — Z7982 Long term (current) use of aspirin: Secondary | ICD-10-CM

## 2013-05-09 DIAGNOSIS — R001 Bradycardia, unspecified: Secondary | ICD-10-CM

## 2013-05-09 DIAGNOSIS — D649 Anemia, unspecified: Secondary | ICD-10-CM

## 2013-05-09 DIAGNOSIS — J690 Pneumonitis due to inhalation of food and vomit: Secondary | ICD-10-CM

## 2013-05-09 DIAGNOSIS — I498 Other specified cardiac arrhythmias: Secondary | ICD-10-CM | POA: Diagnosis present

## 2013-05-09 DIAGNOSIS — I2589 Other forms of chronic ischemic heart disease: Secondary | ICD-10-CM | POA: Diagnosis present

## 2013-05-09 DIAGNOSIS — I214 Non-ST elevation (NSTEMI) myocardial infarction: Secondary | ICD-10-CM

## 2013-05-09 DIAGNOSIS — Z79899 Other long term (current) drug therapy: Secondary | ICD-10-CM

## 2013-05-09 DIAGNOSIS — I5042 Chronic combined systolic (congestive) and diastolic (congestive) heart failure: Secondary | ICD-10-CM

## 2013-05-09 DIAGNOSIS — I5022 Chronic systolic (congestive) heart failure: Secondary | ICD-10-CM

## 2013-05-09 DIAGNOSIS — R7989 Other specified abnormal findings of blood chemistry: Secondary | ICD-10-CM

## 2013-05-09 DIAGNOSIS — R0603 Acute respiratory distress: Secondary | ICD-10-CM

## 2013-05-09 DIAGNOSIS — R6510 Systemic inflammatory response syndrome (SIRS) of non-infectious origin without acute organ dysfunction: Secondary | ICD-10-CM | POA: Diagnosis present

## 2013-05-09 DIAGNOSIS — N179 Acute kidney failure, unspecified: Secondary | ICD-10-CM | POA: Diagnosis present

## 2013-05-09 DIAGNOSIS — I2510 Atherosclerotic heart disease of native coronary artery without angina pectoris: Secondary | ICD-10-CM

## 2013-05-09 DIAGNOSIS — I252 Old myocardial infarction: Secondary | ICD-10-CM

## 2013-05-09 DIAGNOSIS — N189 Chronic kidney disease, unspecified: Secondary | ICD-10-CM

## 2013-05-09 DIAGNOSIS — E785 Hyperlipidemia, unspecified: Secondary | ICD-10-CM

## 2013-05-09 DIAGNOSIS — N183 Chronic kidney disease, stage 3 unspecified: Secondary | ICD-10-CM

## 2013-05-09 DIAGNOSIS — I509 Heart failure, unspecified: Secondary | ICD-10-CM | POA: Diagnosis present

## 2013-05-09 SURGERY — LEFT HEART CATH
Anesthesia: LOCAL

## 2013-05-09 NOTE — ED Notes (Signed)
The pt arrived by [facility] from his home.  The pt has had a temp for 2 days.  Earlier today he fell he did not realize until he woke up on the floor that he had fallen.  He struck his nose on the carpet.  Headache cough white sputum intermittently.  On  Arrival pt alert skin warm and dry no distress. Iv per rems

## 2013-05-09 NOTE — ED Provider Notes (Signed)
CSN: [id number]     Arrival date & time 05/09/13  2322 History   First MD Initiated Contact with Patient 05/09/13 2332     Chief Complaint  Patient presents with  . Loss of Consciousness   (Consider location/radiation/quality/duration/timing/severity/associated sxs/prior Treatment) HPI Patient is a 47 yo man with ischemic cardiomyopathy (EF 25%), HTN, hyperlipidemia, CKD.   He is BIB EMS from his home in [place] after he had a syncopal event. The patient says he got up from a chair and was walking down hallway to bathroom when he lost conciousness and fell to the floor. He fell face first and sustained an abrasion to his nose. He denies any symptoms of near syncope prior to the event. He did not experience any CP or SOB prior to the event. He endorses a history of syncope - two to three time in the past.   The patient reports that he has been feeling poorly throughout the day today. He has felt intermittently hot and cold and has had a diffuse headache. He was noted to have a fever with oral temp of 102.11F on arrival to the ED. The patient notes that he began feeling SOB following the syncopal event. He denies chest pain.   Patient's headache is diffuse, 8/10. The patient says he has had similar headaches in the past but, typically in the setting of HTN.   Past Medical History  Diagnosis Date  . CAD (coronary artery disease) 12/2006    NSTEMI 2008 in the setting of profound epistaxis and anemia, last cath 2012 03/2011 (med rx)  . Hypertension   . Hyperlipidemia   . Chronic kidney disease     Average creatine of about 2.3  . Ischemic cardiomyopathy     Prior EF 25%, apical, posterior akinesis, inferior akinesis, moderate LVH, mild MR, mild decreased RV function,  65% EF 2012 echo.  . Anemia    Past Surgical History  Procedure Laterality Date  . Cardiac catheterization  2012    total RCA occlusion with left-to-right collaterals supplying the distal RCA branches, mild diffuse  nonobstructive disease of LAD and LCx. Medically managed   Family History  Problem Relation Age of Onset  . Hypertension Mother     deceased  . Diabetes Mother     deceased  . Heart disease Mother     deceased  . Heart attack Father     at age 38, deceased   History  Substance Use Topics  . Smoking status: Former Smoker -- 1.00 packs/day for 10 years    Types: Cigarettes    Quit date: 11/16/2006  . Smokeless tobacco: Never Used  . Alcohol Use: No    Review of Systems Gen: General malaise for the past 2-3 days-worsening today. Eyes: no discharge or drainage, no occular pain or visual changes Nose: no epistaxis or rhinorrhea Mouth: no dental pain, no sore throat Neck: no neck pain Lungs: As per history of present illness, otherwise negative CV: no chest pain, palpitations, dependent edema or orthopnea Abd: no abdominal pain, nausea, vomiting GU: no dysuria or gross hematuria MSK: Myalgias. Neuro: Headache as noted above, the patient denies any focal neurologic deficits. Syncope as noted above. Skin: no rash Psyche: negative.  Allergies  Review of patient's allergies indicates no known allergies.  Home Medications   Current Outpatient Rx  Name  Route  Sig  Dispense  Refill  . amLODipine (NORVASC) 10 MG tablet      TAKE 1 TABLET (10 MG TOTAL) BY  MOUTH DAILY.   90 tablet   2     RX SIG: TAKE 1 TABLET (10 MG TOTAL) BY MOUTH DAILY ...   . aspirin EC 81 MG tablet   Oral   Take 81 mg by mouth daily.         . carvedilol (COREG) 25 MG tablet   Oral   Take 1 tablet (25 mg total) by mouth 2 (two) times daily with a meal.   60 tablet   1   . furosemide (LASIX) 40 MG tablet   Oral   Take 1 tablet (40 mg total) by mouth 2 (two) times daily.   240 tablet   3     E-RX RENEW NW:GNFAOZHY MD, JAMES RENEWED FROM RX 2 ...   . hydrALAZINE (APRESOLINE) 50 MG tablet      TAKE 1 TABLET (50 MG TOTAL) BY MOUTH 3 (THREE) TIMES DAILY.   270 tablet   2     RX SIG: TAKE  1 TABLET (50 MG TOTAL) BY MOUTH 3 (TH ...   . isosorbide mononitrate (IMDUR) 120 MG 24 hr tablet   Oral   Take 1 tablet (120 mg total) by mouth daily.   30 tablet   4   . levofloxacin (LEVAQUIN) 750 MG tablet   Oral   Take 1 tablet (750 mg total) by mouth every other day. Start on 09/01/2012 (received a dose in the hospital on 08/30/2012).   3 tablet   0   . nitroGLYCERIN (NITROSTAT) 0.4 MG SL tablet      Place 1 tablet (0.4 mg total) under the tongue every 5 (five) minutes x 3 doses as needed. For chest pain   25 tablet   2     Refill [id number]   . pravastatin (PRAVACHOL) 40 MG tablet   Oral   Take 1 tablet (40 mg total) by mouth every evening.   30 tablet   6   . ranolazine (RANEXA) 500 MG 12 hr tablet   Oral   Take 1 tablet (500 mg total) by mouth 2 (two) times daily.   60 tablet   1   . spironolactone (ALDACTONE) 25 MG tablet      TAKE 0.5 TABLET (12.5 MG TOTAL) BY MOUTH DAILY.   45 tablet   2     RX SIG: TAKE 0.5 TABLET (12.5 MG TOTAL) BY MOUTH D ...    BP 114/80  Pulse 109  Temp(Src) 102.6 F (39.2 C) (Oral)  Resp 18  SpO2 95% Physical Exam Gen: well developed and well nourished appearing, appears acutely ill. Head: NCAT Eyes: PERL, EOMI Nose: no epistaixis or rhinorrhea Mouth/throat: mucosa is mildly dehydrated appearing and pink Neck: supple, no stridor, no JVD appreciated Lungs: Respiratory rate is 32 times per minute, there are bibasilar rhonchi and mild accessory muscle use CV: Regular but rapid rate, no murmur appreciated, equal peripheral pulses Abd: soft, obese, notender, nondistended Back: no ttp, no cva ttp Skin: Warm and intact without rash Neuro: CN ii-xii grossly intact, no focal deficits Psyche; normal affect,  calm and cooperative.   ED Course  Procedures (including critical care time) Labs Review Results for orders placed during the hospital encounter of 05/09/13 (from the past 24 hour(s))  LACTIC ACID, PLASMA     Status: None    Collection Time    05/10/13 12:45 AM      Result Value Range   Lactic Acid, Venous 2.2  0.5 - 2.2 mmol/L  HEPATIC FUNCTION  PANEL     Status: Abnormal   Collection Time    05/10/13 12:45 AM      Result Value Range   Total Protein 7.3  6.0 - 8.3 g/dL   Albumin 2.7 (*) 3.5 - 5.2 g/dL   AST 43 (*) 0 - 37 U/L   ALT 18  0 - 53 U/L   Alkaline Phosphatase 57  39 - 117 U/L   Total Bilirubin 1.1  0.3 - 1.2 mg/dL   Bilirubin, Direct 0.4 (*) 0.0 - 0.3 mg/dL   Indirect Bilirubin 0.7  0.3 - 0.9 mg/dL  PROTIME-INR     Status: Abnormal   Collection Time    05/10/13 12:45 AM      Result Value Range   Prothrombin Time 18.4 (*) 11.6 - 15.2 seconds   INR 1.58 (*) 0.00 - 1.49  PRO B NATRIURETIC PEPTIDE     Status: Abnormal   Collection Time    05/10/13 12:45 AM      Result Value Range   Pro B Natriuretic peptide (BNP) 27237.0 (*) 0 - 125 pg/mL  ETHANOL     Status: None   Collection Time    05/10/13 12:45 AM      Result Value Range   Alcohol, Ethyl (B) <11  0 - 11 mg/dL  GLUCOSE, CAPILLARY     Status: Abnormal   Collection Time    05/10/13  2:55 AM      Result Value Range   Glucose-Capillary 191 (*) 70 - 99 mg/dL  URINALYSIS, ROUTINE W REFLEX MICROSCOPIC     Status: Abnormal   Collection Time    05/10/13  4:04 AM      Result Value Range   Color, Urine AMBER (*) YELLOW   APPearance CLOUDY (*) CLEAR   Specific Gravity, Urine 1.012  1.005 - 1.030   pH 5.5  5.0 - 8.0   Glucose, UA NEGATIVE  NEGATIVE mg/dL   Hgb urine dipstick TRACE (*) NEGATIVE   Bilirubin Urine NEGATIVE  NEGATIVE   Ketones, ur NEGATIVE  NEGATIVE mg/dL   Protein, ur 161 (*) NEGATIVE mg/dL   Urobilinogen, UA 1.0  0.0 - 1.0 mg/dL   Nitrite NEGATIVE  NEGATIVE   Leukocytes, UA TRACE (*) NEGATIVE  URINE RAPID DRUG SCREEN (HOSP PERFORMED)     Status: Abnormal   Collection Time    05/10/13  4:04 AM      Result Value Range   Opiates POSITIVE (*) NONE DETECTED   Cocaine NONE DETECTED  NONE DETECTED   Benzodiazepines NONE  DETECTED  NONE DETECTED   Amphetamines NONE DETECTED  NONE DETECTED   Tetrahydrocannabinol POSITIVE (*) NONE DETECTED   Barbiturates NONE DETECTED  NONE DETECTED  URINE MICROSCOPIC-ADD ON     Status: Abnormal   Collection Time    05/10/13  4:04 AM      Result Value Range   Squamous Epithelial / LPF FEW (*) RARE   WBC, UA 3-6  <3 WBC/hpf   RBC / HPF 0-2  <3 RBC/hpf   Bacteria, UA FEW (*) RARE   Casts GRANULAR CAST (*) NEGATIVE   Urine-Other AMORPHOUS URATES/PHOSPHATES    POCT I-STAT TROPONIN I     Status: Abnormal   Collection Time    05/10/13  4:05 AM      Result Value Range   Troponin i, poc 0.43 (*) 0.00 - 0.08 ng/mL   Comment NOTIFIED PHYSICIAN     Comment 3       POC  Troponin I       Value: called result to Dr. Lavella Lemons on troponin 0.43 RN is aware  CBC     Status: Abnormal   Collection Time    05/10/13  4:10 AM      Result Value Range   WBC 13.8 (*) 4.0 - 10.5 K/uL   RBC 4.21 (*) 4.22 - 5.81 MIL/uL   Hemoglobin 11.8 (*) 13.0 - 17.0 g/dL   HCT 81.1 (*) 91.4 - 78.2 %   MCV 83.4  78.0 - 100.0 fL   MCH 28.0  26.0 - 34.0 pg   MCHC 33.6  30.0 - 36.0 g/dL   RDW 95.6  21.3 - 08.6 %   Platelets 187  150 - 400 K/uL   CXR: cardiomegaly, bibasilar infiltrates.   EKG on arrival reviewed with Dr. Excell Seltzer: NSR,normal axis, LVH pattern with j point elevation, no acute ST-T segment changes concerning for acute ischemia.   2nd EKG: rapid and regular narrow complex rhythm with a rate in the 140s most consistent with supraventricular tachycardia. MDM  A CODE STEMI was initiated on the patient by EMS. Dr. Excell Seltzer, Cardiologist, met the patient with me as he was entering the ED. He reviewed the patient's EKG and obtained a history from the patient and did not feel that the patient was having a STEMI.   The patient's work up of fever, syncope, headache proceeded.   The patient is septic with bibasilar pneumonia (CAP). However, he has also ruled in for NSTEMI with positive troponin which has  trended up on interval check. The patient received empiric abx along with lasix for some suspected associated pulmonary edemna. Treated with supplemental O2.  Treated with ASA. Case discussed again with Dr. Excell Seltzer who requests that medicine admit and did not recommend any further antiplatelet or anticoagulant medication. Does not recommend BB at this time.   I was informed by the patient's nurse that he developed a rapid and regular rate to the 150 range while sleeping. EKG resembles SVT. Cardiology paged again. We are treating with adenosine. Rate converted to NSR. No associated chest pain.   We are treating CAP with empiric abx. Cultures obtained.   Late entry: The case was discussed with Dr. Onalee Hua at the time that medical admission was decided upon. She initially agreed to admit the patient but requested telemetry bed. As the patient's course in the emergency department involved, we recommended a step down bed. Dr. Onalee Hua was updated on all events and the emergency department as well as Dr. Earmon Phoenix consult and recommendations.  CRITICAL CARE Performed by: Brandt Loosen   Total critical care time: 57m  Critical care time was exclusive of separately billable procedures and treating other patients.  Critical care was necessary to treat or prevent imminent or life-threatening deterioration.  Critical care was time spent personally by me on the following activities: development of treatment plan with patient and/or surrogate as well as nursing, discussions with consultants, evaluation of patient's response to treatment, examination of patient, obtaining history from patient or surrogate, ordering and performing treatments and interventions, ordering and review of laboratory studies, ordering and review of radiographic studies, pulse oximetry and re-evaluation of patient's condition.    Brandt Loosen, MD 05/10/13 [phone]

## 2013-05-10 ENCOUNTER — Emergency Department (HOSPITAL_COMMUNITY): Payer: Medicare Other

## 2013-05-10 DIAGNOSIS — I5022 Chronic systolic (congestive) heart failure: Secondary | ICD-10-CM

## 2013-05-10 DIAGNOSIS — I517 Cardiomegaly: Secondary | ICD-10-CM

## 2013-05-10 DIAGNOSIS — I2510 Atherosclerotic heart disease of native coronary artery without angina pectoris: Secondary | ICD-10-CM

## 2013-05-10 DIAGNOSIS — I509 Heart failure, unspecified: Secondary | ICD-10-CM

## 2013-05-10 DIAGNOSIS — I10 Essential (primary) hypertension: Secondary | ICD-10-CM

## 2013-05-10 DIAGNOSIS — N189 Chronic kidney disease, unspecified: Secondary | ICD-10-CM

## 2013-05-10 DIAGNOSIS — R55 Syncope and collapse: Secondary | ICD-10-CM

## 2013-05-10 DIAGNOSIS — I5023 Acute on chronic systolic (congestive) heart failure: Secondary | ICD-10-CM

## 2013-05-10 DIAGNOSIS — J189 Pneumonia, unspecified organism: Principal | ICD-10-CM

## 2013-05-10 LAB — URINE MICROSCOPIC-ADD ON

## 2013-05-10 LAB — BASIC METABOLIC PANEL
BUN: 25 mg/dL — ABNORMAL HIGH (ref 6–23)
CO2: 25 mEq/L (ref 19–32)
Chloride: 92 mEq/L — ABNORMAL LOW (ref 96–112)
Chloride: 92 mEq/L — ABNORMAL LOW (ref 96–112)
GFR calc Af Amer: 26 mL/min — ABNORMAL LOW (ref >90)
GFR calc Af Amer: 27 mL/min — ABNORMAL LOW (ref >90)
Potassium: 4.2 mEq/L (ref 3.5–5.1)
Potassium: 4.1 mEq/L (ref 3.5–5.1)

## 2013-05-10 LAB — URINALYSIS, ROUTINE W REFLEX MICROSCOPIC
Bilirubin Urine: NEGATIVE
Glucose, UA: NEGATIVE mg/dL
Ketones, ur: NEGATIVE mg/dL
Specific Gravity, Urine: 1.012 (ref 1.005–1.030)
pH: 5.5 (ref 5.0–8.0)

## 2013-05-10 LAB — RAPID URINE DRUG SCREEN, HOSP PERFORMED
Barbiturates: NOT DETECTED
Benzodiazepines: NOT DETECTED
Cocaine: NOT DETECTED
Opiates: POSITIVE — AB
Tetrahydrocannabinol: POSITIVE — AB

## 2013-05-10 LAB — CBC
Hemoglobin: 11.8 g/dL — ABNORMAL LOW (ref 13.0–17.0)
MCH: 28 pg (ref 26.0–34.0)
MCHC: 33.6 g/dL (ref 30.0–36.0)
MCV: 83.4 fL (ref 78.0–100.0)

## 2013-05-10 LAB — HEPATIC FUNCTION PANEL
ALT: 18 U/L (ref 0–53)
AST: 43 U/L — ABNORMAL HIGH (ref 0–37)
Albumin: 2.7 g/dL — ABNORMAL LOW (ref 3.5–5.2)
Alkaline Phosphatase: 57 U/L (ref 39–117)
Total Protein: 7.3 g/dL (ref 6.0–8.3)

## 2013-05-10 LAB — TROPONIN I
Troponin I: 0.51 ng/mL (ref <0.30)
Troponin I: 0.46 ng/mL (ref <0.30)

## 2013-05-10 LAB — POCT I-STAT TROPONIN I: Troponin i, poc: 0.43 ng/mL (ref 0.00–0.08)

## 2013-05-10 LAB — MRSA PCR SCREENING: MRSA by PCR: NEGATIVE

## 2013-05-10 MED ORDER — ENOXAPARIN SODIUM 40 MG/0.4ML ~~LOC~~ SOLN
40.0000 mg | Freq: Every day | SUBCUTANEOUS | Status: DC
  Administered 2013-05-10 – 2013-05-11 (×2): 40 mg via SUBCUTANEOUS
  Filled 2013-05-10 (×2): qty 0.4

## 2013-05-10 MED ORDER — FUROSEMIDE 10 MG/ML IJ SOLN
40.0000 mg | Freq: Two times a day (BID) | INTRAMUSCULAR | Status: DC
  Administered 2013-05-10 (×2): 40 mg via INTRAVENOUS
  Filled 2013-05-10 (×4): qty 4

## 2013-05-10 MED ORDER — SODIUM CHLORIDE 0.9 % IJ SOLN
3.0000 mL | Freq: Two times a day (BID) | INTRAMUSCULAR | Status: DC
  Administered 2013-05-10 – 2013-05-15 (×9): 3 mL via INTRAVENOUS

## 2013-05-10 MED ORDER — DEXTROSE 5 % IV SOLN
500.0000 mg | Freq: Every day | INTRAVENOUS | Status: DC
  Administered 2013-05-10 – 2013-05-12 (×3): 500 mg via INTRAVENOUS
  Filled 2013-05-10 (×4): qty 500

## 2013-05-10 MED ORDER — DEXTROSE 5 % IV SOLN
500.0000 mg | Freq: Once | INTRAVENOUS | Status: AC
  Administered 2013-05-10: 500 mg via INTRAVENOUS

## 2013-05-10 MED ORDER — DEXTROSE 5 % IV SOLN
2.0000 g | Freq: Once | INTRAVENOUS | Status: AC
  Administered 2013-05-10: 2 g via INTRAVENOUS

## 2013-05-10 MED ORDER — RANOLAZINE ER 500 MG PO TB12
500.0000 mg | ORAL_TABLET | Freq: Two times a day (BID) | ORAL | Status: DC
  Administered 2013-05-10 – 2013-05-15 (×11): 500 mg via ORAL
  Filled 2013-05-10 (×13): qty 1

## 2013-05-10 MED ORDER — SODIUM CHLORIDE 0.9 % IV BOLUS (SEPSIS)
500.0000 mL | Freq: Once | INTRAVENOUS | Status: AC
  Administered 2013-05-10: 500 mL via INTRAVENOUS

## 2013-05-10 MED ORDER — ALBUTEROL SULFATE (5 MG/ML) 0.5% IN NEBU: 2.5000 mg | INHALATION_SOLUTION | RESPIRATORY_TRACT | Status: DC | PRN

## 2013-05-10 MED ORDER — ACETAMINOPHEN 325 MG PO TABS
650.0000 mg | ORAL_TABLET | Freq: Once | ORAL | Status: AC
  Administered 2013-05-10: 650 mg via ORAL

## 2013-05-10 MED ORDER — SIMVASTATIN 20 MG PO TABS
20.0000 mg | ORAL_TABLET | Freq: Every day | ORAL | Status: DC
  Administered 2013-05-10 – 2013-05-14 (×5): 20 mg via ORAL
  Filled 2013-05-10 (×6): qty 1

## 2013-05-10 MED ORDER — ISOSORBIDE MONONITRATE ER 30 MG PO TB24
30.0000 mg | ORAL_TABLET | Freq: Every day | ORAL | Status: DC
  Administered 2013-05-10 – 2013-05-15 (×6): 30 mg via ORAL
  Filled 2013-05-10 (×6): qty 1

## 2013-05-10 MED ORDER — DEXTROSE 5 % IV SOLN
1.0000 g | Freq: Every day | INTRAVENOUS | Status: DC
  Administered 2013-05-10 – 2013-05-15 (×5): 1 g via INTRAVENOUS
  Filled 2013-05-10 (×7): qty 10

## 2013-05-10 MED ORDER — SODIUM CHLORIDE 0.9 % IV SOLN: 250.0000 mL | INTRAVENOUS | Status: DC | PRN

## 2013-05-10 MED ORDER — ACETAMINOPHEN 325 MG PO TABS
650.0000 mg | ORAL_TABLET | Freq: Once | ORAL | Status: AC
  Administered 2013-05-10: 650 mg via ORAL
  Filled 2013-05-10: qty 2

## 2013-05-10 MED ORDER — CARVEDILOL 6.25 MG PO TABS
6.2500 mg | ORAL_TABLET | Freq: Two times a day (BID) | ORAL | Status: DC
  Administered 2013-05-10 – 2013-05-15 (×10): 6.25 mg via ORAL
  Filled 2013-05-10 (×13): qty 1

## 2013-05-10 MED ORDER — ADENOSINE 6 MG/2ML IV SOLN
6.0000 mg | Freq: Once | INTRAVENOUS | Status: AC
  Administered 2013-05-10: 6 mg via INTRAVENOUS

## 2013-05-10 MED ORDER — FUROSEMIDE 10 MG/ML IJ SOLN
80.0000 mg | Freq: Once | INTRAMUSCULAR | Status: AC
  Administered 2013-05-10: 80 mg via INTRAVENOUS
  Filled 2013-05-10: qty 8

## 2013-05-10 MED ORDER — HYDRALAZINE HCL 25 MG PO TABS
25.0000 mg | ORAL_TABLET | Freq: Three times a day (TID) | ORAL | Status: DC
  Administered 2013-05-10 – 2013-05-11 (×3): 25 mg via ORAL
  Filled 2013-05-10 (×9): qty 1

## 2013-05-10 MED ORDER — ONDANSETRON HCL 4 MG/2ML IJ SOLN
4.0000 mg | Freq: Once | INTRAMUSCULAR | Status: AC
  Administered 2013-05-10: 4 mg via INTRAVENOUS

## 2013-05-10 MED ORDER — ASPIRIN 81 MG PO CHEW
81.0000 mg | CHEWABLE_TABLET | Freq: Every day | ORAL | Status: DC
  Administered 2013-05-10 – 2013-05-15 (×6): 81 mg via ORAL
  Filled 2013-05-10 (×5): qty 1

## 2013-05-10 MED ORDER — ONDANSETRON HCL 4 MG/2ML IJ SOLN
4.0000 mg | Freq: Once | INTRAMUSCULAR | Status: AC
  Administered 2013-05-10: 4 mg via INTRAVENOUS
  Filled 2013-05-10: qty 2

## 2013-05-10 MED ORDER — MORPHINE SULFATE 4 MG/ML IJ SOLN
4.0000 mg | Freq: Once | INTRAMUSCULAR | Status: AC
  Administered 2013-05-10: 4 mg via INTRAVENOUS

## 2013-05-10 MED ORDER — ENOXAPARIN SODIUM 150 MG/ML ~~LOC~~ SOLN: 1.0000 mg/kg | Freq: Once | SUBCUTANEOUS | Status: DC

## 2013-05-10 MED ORDER — ASPIRIN 81 MG PO CHEW
324.0000 mg | CHEWABLE_TABLET | Freq: Once | ORAL | Status: AC
  Administered 2013-05-10: 324 mg via ORAL
  Filled 2013-05-10: qty 4

## 2013-05-10 MED ORDER — SODIUM CHLORIDE 0.9 % IJ SOLN
3.0000 mL | INTRAMUSCULAR | Status: DC | PRN
  Administered 2013-05-12: 3 mL via INTRAVENOUS

## 2013-05-10 MED ORDER — FUROSEMIDE 40 MG PO TABS
40.0000 mg | ORAL_TABLET | Freq: Two times a day (BID) | ORAL | Status: DC
  Filled 2013-05-10 (×2): qty 1

## 2013-05-10 NOTE — ED Notes (Signed)
Report called to 2600 

## 2013-05-10 NOTE — ED Notes (Signed)
Tylenol was given 3 hours ago and his temp is 99.2 now.  Tylenol not given at present

## 2013-05-10 NOTE — Consult Note (Addendum)
Reason for Consult:elevated troponin Referring Physician: Dr Orpha Bur is an 47 y.o. male.  HPI:  [name] is a 47 year old male with a severely depressed LV systolic function and coronary artery disease. He is known to have an occluded RCA with collaterals. He also has a history of hypertension, dyslipidemia and chronic kidney disease. He presents to the ED today after losing consciousness. The patient states that he has been having fevers and chills for the past several days. He has developed a severe frontal headache as well. Today he has been having a productive cough. This cough has been worsening over several days. He does not recall the events leading up to his passing out. He knows that the diastolic blood pressure was low prior to him passing out. He tells me that aside from this recent illness he has been doing well. He has not had any exertional shortness of breath or chest pains in quite some time. He feels that his current medications have been working well for him. He has been compliant with diet and medications.  In the ED he was initially called as a Code STEMI, but upon further examination it was clear that his ECG showed his old anterior MI w/q-waves. He was then found to have a right lower lobe pneumonia. A troponin was drawn and is elevated.  [name] Grimm did have an episode of SVT with rate around 150 bpm that was terminated with 6mg  of IV adenosine.   Past Medical History  Diagnosis Date  . CAD (coronary artery disease) 12/2006    NSTEMI 2008 in the setting of profound epistaxis and anemia, last cath 2012 03/2011 (med rx)  . Hypertension   . Hyperlipidemia   . Chronic kidney disease     Average creatine of about 2.3  . Ischemic cardiomyopathy     Prior EF 25%, apical, posterior akinesis, inferior akinesis, moderate LVH, mild [name], mild decreased RV function,  65% EF 2012 echo.  . Anemia     No current facility-administered medications on file prior to encounter.    Current Outpatient Prescriptions on File Prior to Encounter  Medication Sig Dispense Refill  . aspirin EC 81 MG tablet Take 81 mg by mouth daily.      . carvedilol (COREG) 25 MG tablet Take 1 tablet (25 mg total) by mouth 2 (two) times daily with a meal.  60 tablet  1  . furosemide (LASIX) 40 MG tablet Take 1 tablet (40 mg total) by mouth 2 (two) times daily.  240 tablet  3  . isosorbide mononitrate (IMDUR) 120 MG 24 hr tablet Take 1 tablet (120 mg total) by mouth daily.  30 tablet  4  . ranolazine (RANEXA) 500 MG 12 hr tablet Take 1 tablet (500 mg total) by mouth 2 (two) times daily.  60 tablet  1     Past Surgical History  Procedure Laterality Date  . Cardiac catheterization  2012    total RCA occlusion with left-to-right collaterals supplying the distal RCA branches, mild diffuse nonobstructive disease of LAD and LCx. Medically managed    Family History  Problem Relation Age of Onset  . Hypertension Mother     deceased  . Diabetes Mother     deceased  . Heart disease Mother     deceased  . Heart attack Father     at age 50, deceased    Social History:  reports that he quit smoking about 6 years ago. His smoking use  included Cigarettes. He has a 10 pack-year smoking history. He has never used smokeless tobacco. He reports that he does not drink alcohol. His drug history is not on file.  Allergies: No Known Allergies  Medications: I have reviewed the patient's current medications.  Results for orders placed during the hospital encounter of 05/09/13 (from the past 48 hour(s))  LACTIC ACID, PLASMA     Status: None   Collection Time    05/10/13 12:45 AM      Result Value Range   Lactic Acid, Venous 2.2  0.5 - 2.2 mmol/L  HEPATIC FUNCTION PANEL     Status: Abnormal   Collection Time    05/10/13 12:45 AM      Result Value Range   Total Protein 7.3  6.0 - 8.3 g/dL   Albumin 2.7 (*) 3.5 - 5.2 g/dL   AST 43 (*) 0 - 37 U/L   ALT 18  0 - 53 U/L   Alkaline Phosphatase 57  39  - 117 U/L   Total Bilirubin 1.1  0.3 - 1.2 mg/dL   Bilirubin, Direct 0.4 (*) 0.0 - 0.3 mg/dL   Indirect Bilirubin 0.7  0.3 - 0.9 mg/dL  PROTIME-INR     Status: Abnormal   Collection Time    05/10/13 12:45 AM      Result Value Range   Prothrombin Time 18.4 (*) 11.6 - 15.2 seconds   INR 1.58 (*) 0.00 - 1.49  PRO B NATRIURETIC PEPTIDE     Status: Abnormal   Collection Time    05/10/13 12:45 AM      Result Value Range   Pro B Natriuretic peptide (BNP) 27237.0 (*) 0 - 125 pg/mL  ETHANOL     Status: None   Collection Time    05/10/13 12:45 AM      Result Value Range   Alcohol, Ethyl (B) <11  0 - 11 mg/dL   Comment:            LOWEST DETECTABLE LIMIT FOR     SERUM ALCOHOL IS 11 mg/dL     FOR MEDICAL PURPOSES ONLY  GLUCOSE, CAPILLARY     Status: Abnormal   Collection Time    05/10/13  2:55 AM      Result Value Range   Glucose-Capillary 191 (*) 70 - 99 mg/dL    Dg Chest 2 View  09/08/1094   *RADIOLOGY REPORT*  Clinical Data: Loss of consciousness and fall.  Temperature for 2 days.  CHEST - 2 VIEW  Comparison: 08/28/2012  Findings: There is increased density in the lung bases particularly on the right with air bronchograms.  Changes are consistent with pneumonia although atelectasis or consolidation can also have this appearance.  There is progression since the previous study.  Mild cardiac enlargement with normal pulmonary vascularity.  No blunting of costophrenic angles.  No pneumothorax.  Mediastinal contours appear intact.  IMPRESSION: Focal consolidation in the right lung base suggest pneumonia. Atelectasis or infiltration also demonstrated in the left lung base.   Original Report Authenticated By: Burman Nieves, M.D.   Ct Head Wo Contrast  05/10/2013   *RADIOLOGY REPORT*  Clinical Data: Temperature for 2 days.  Fall earlier today, striking nose on the carpet.  Headache.  Cough with white sputum.  CT HEAD WITHOUT CONTRAST  Technique:  Contiguous axial images were obtained from the base  of the skull through the vertex without contrast.  Comparison: 03/28/2010  Findings: The ventricles and sulci are symmetrical without significant effacement,  displacement, or dilatation. No mass effect or midline shift. No abnormal extra-axial fluid collections. The grey-white matter junction is distinct. Basal cisterns are not effaced. No acute intracranial hemorrhage. No depressed skull fractures.  Visualized paranasal sinuses and mastoid air cells are not opacified.  Vascular calcifications.  No significant changes since the previous study.  IMPRESSION: No acute intracranial abnormalities.   Original Report Authenticated By: Burman Nieves, M.D.    Review of Systems  Constitutional: Positive for fever, chills and diaphoresis.  Eyes: Negative.   Respiratory: Positive for cough, sputum production, shortness of breath and wheezing.   Cardiovascular: Negative for chest pain and palpitations.  Gastrointestinal: Negative.   Genitourinary: Negative.   Musculoskeletal: Negative.   Neurological: Positive for loss of consciousness.  All other systems reviewed and are negative.   Blood pressure 104/74, pulse 97, temperature 99.1 F (37.3 C), temperature source Oral, resp. rate 20, SpO2 96.00%. Physical Exam  Nursing note and vitals reviewed. Constitutional: He is oriented to person, place, and time.  Neck: No JVD present.  Cardiovascular: Normal rate, regular rhythm, S1 normal and S2 normal.   No murmur heard. Laterally displaced PMI  Respiratory: He has wheezes in the right middle field and the right lower field. He has rhonchi in the right middle field and the right lower field. He has rales in the right middle field and the right lower field.  GI: Soft. Bowel sounds are normal.  Musculoskeletal: He exhibits no edema.  Neurological: He is alert and oriented to person, place, and time.  Skin: He is diaphoretic.    Assessment/Plan: [name] Bisping is a pleasant 47 year old male with a  cardiomyopathy, CKD, hypertension and dyslipidemia. He is here in the ED today with probably sepsis from a pulmonary source. He also has a mildly elevated troponin. In the setting of critical illness, elevated troponin is not unexpected. This does not represent a plaque rupture so anti-platelet or anti-coagulation therapy will not improve his outcome. His home anti-hypertensive medications should be held. He coronary disease was stable leading up to his pneumonia. His blood pressure is on the lower side in the ED. He will likely need aggressive diuresis once his blood pressure stabilizes because he has already gotten some fluids in the ED. The SVT is being driven by his adrenergic state. There is some artifact on the tracing but it is probably AFlutter w/2:1 conduction. This doesn't change much in how we would approach his management. If possible he could get a low dose of metoprolol which would effect the blood pressure less than the coreg.   Recommendations: Would not start dual anti-platelet therapy or systemic anticoagulation Hypertensive medications can be held as needed Would continue aspirin if possible Continue telemetry Would consult internal medicine for admission and management of pneumonia.    Robyne Peers, MD    Anne Ng C 05/10/2013, 4:01 AM

## 2013-05-10 NOTE — ED Notes (Signed)
rn called to get report unable to give report at present

## 2013-05-10 NOTE — H&P (Signed)
PCP:   Josue Hector, MD   Chief Complaint:  Cough, wheeze fever  HPI: 47 yo male h/o cad, ICM EF 25%, htn, ckd comes in with several days of cough, wheezing got worse today with some fever and sob.  Denies any n/v/d.  No le edema.  Chills during the day.  No recent illnesses.  No recent abx.  Cp associated with the cough.  Today he got up to go to the bathroom and passed out briefly in the hallway.  Denies any injury or pain anywhere from the syncopal episode.  No focal neuro deficits at this time.  Review of Systems:  Positive and negative as per HPI otherwise all other systems are negative  Past Medical History: Past Medical History  Diagnosis Date  . CAD (coronary artery disease) 12/2006    NSTEMI 2008 in the setting of profound epistaxis and anemia, last cath 2012 03/2011 (med rx)  . Hypertension   . Hyperlipidemia   . Chronic kidney disease     Average creatine of about 2.3  . Ischemic cardiomyopathy     Prior EF 25%, apical, posterior akinesis, inferior akinesis, moderate LVH, mild MR, mild decreased RV function,  65% EF 2012 echo.  . Anemia    Past Surgical History  Procedure Laterality Date  . Cardiac catheterization  2012    total RCA occlusion with left-to-right collaterals supplying the distal RCA branches, mild diffuse nonobstructive disease of LAD and LCx. Medically managed    Medications: Prior to Admission medications   Medication Sig Start Date End Date Taking? Authorizing Provider  amLODipine (NORVASC) 10 MG tablet Take 10 mg by mouth daily.   Yes Historical Provider, MD  aspirin EC 81 MG tablet Take 81 mg by mouth daily.   Yes Historical Provider, MD  carvedilol (COREG) 25 MG tablet Take 1 tablet (25 mg total) by mouth 2 (two) times daily with a meal. 03/11/13  Yes Rollene Rotunda, MD  cholecalciferol (VITAMIN D) 1000 UNITS tablet Take 1,000 Units by mouth daily.   Yes Historical Provider, MD  furosemide (LASIX) 40 MG tablet Take 1 tablet (40 mg total)  by mouth 2 (two) times daily. 12/16/12  Yes Rollene Rotunda, MD  hydrALAZINE (APRESOLINE) 50 MG tablet Take 50 mg by mouth 3 (three) times daily.   Yes Historical Provider, MD  isosorbide mononitrate (IMDUR) 120 MG 24 hr tablet Take 1 tablet (120 mg total) by mouth daily. 04/15/13  Yes Rollene Rotunda, MD  nitroGLYCERIN (NITROSTAT) 0.4 MG SL tablet Place 0.4 mg under the tongue every 5 (five) minutes as needed for chest pain.   Yes Historical Provider, MD  pravastatin (PRAVACHOL) 40 MG tablet Take 40 mg by mouth at bedtime.   Yes Historical Provider, MD  ranolazine (RANEXA) 500 MG 12 hr tablet Take 1 tablet (500 mg total) by mouth 2 (two) times daily. 03/11/13 03/11/14 Yes Rollene Rotunda, MD  spironolactone (ALDACTONE) 25 MG tablet Take 12.5 mg by mouth daily.   Yes Historical Provider, MD    Allergies:  No Known Allergies  Social History:  reports that he quit smoking about 6 years ago. His smoking use included Cigarettes. He has a 10 pack-year smoking history. He has never used smokeless tobacco. He reports that he does not drink alcohol. His drug history is not on file.  Family History: Family History  Problem Relation Age of Onset  . Hypertension Mother     deceased  . Diabetes Mother     deceased  . Heart  disease Mother     deceased  . Heart attack Father     at age 35, deceased    Physical Exam: Filed Vitals:   05/09/13 2332 05/10/13 0113 05/10/13 0155 05/10/13 0358  BP: 114/80 104/74    Pulse: 109 98 97   Temp: 102.6 F (39.2 C)  101.4 F (38.6 C) 99.1 F (37.3 C)  TempSrc: Oral     Resp: 18  20   SpO2: 95% 94% 96%    General appearance: alert, cooperative and no distress Head: Normocephalic, without obvious abnormality, atraumatic Eyes: negative Nose: Nares normal. Septum midline. Mucosa normal. No drainage or sinus tenderness. Neck: no JVD and supple, symmetrical, trachea midline Lungs: rhonchi bilaterally and wheezes bilaterally Heart: regular rate and rhythm, S1, S2  normal, no murmur, click, rub or gallop Abdomen: soft, non-tender; bowel sounds normal; no masses,  no organomegaly Extremities: extremities normal, atraumatic, no cyanosis or edema Pulses: 2+ and symmetric Skin: Skin color, texture, turgor normal. No rashes or lesions Neurologic: Grossly normal  Labs on Admission:  pending  Recent Labs  05/10/13 0045  AST 43*  ALT 18  ALKPHOS 57  BILITOT 1.1  PROT 7.3  ALBUMIN 2.7*    Radiological Exams on Admission: Dg Chest 2 View  05/10/2013   *RADIOLOGY REPORT*  Clinical Data: Loss of consciousness and fall.  Temperature for 2 days.  CHEST - 2 VIEW  Comparison: 08/28/2012  Findings: There is increased density in the lung bases particularly on the right with air bronchograms.  Changes are consistent with pneumonia although atelectasis or consolidation can also have this appearance.  There is progression since the previous study.  Mild cardiac enlargement with normal pulmonary vascularity.  No blunting of costophrenic angles.  No pneumothorax.  Mediastinal contours appear intact.  IMPRESSION: Focal consolidation in the right lung base suggest pneumonia. Atelectasis or infiltration also demonstrated in the left lung base.   Original Report Authenticated By: Burman Nieves, M.D.   Ct Head Wo Contrast  05/10/2013   *RADIOLOGY REPORT*  Clinical Data: Temperature for 2 days.  Fall earlier today, striking nose on the carpet.  Headache.  Cough with white sputum.  CT HEAD WITHOUT CONTRAST  Technique:  Contiguous axial images were obtained from the base of the skull through the vertex without contrast.  Comparison: 03/28/2010  Findings: The ventricles and sulci are symmetrical without significant effacement, displacement, or dilatation. No mass effect or midline shift. No abnormal extra-axial fluid collections. The grey-white matter junction is distinct. Basal cisterns are not effaced. No acute intracranial hemorrhage. No depressed skull fractures.  Visualized  paranasal sinuses and mastoid air cells are not opacified.  Vascular calcifications.  No significant changes since the previous study.  IMPRESSION: No acute intracranial abnormalities.   Original Report Authenticated By: Burman Nieves, M.D.    Assessment/Plan  47 yo male with cap, icm systolic chronic chf, syncope Principal Problem:   CAP (community acquired pneumonia) Active Problems:   CAD   Chronic systolic heart failure   CHRONIC KIDNEY DISEASE UNSPECIFIED   Syncope  Labs are pending at this time.  Trop is mildly elevated per report of 0.3.  ekg old changes.  Cardiology has been consulted.  Place on pna pathway for CAP.  Cont to serial his cardiac enzymes.  Ck orthostatics.  Hold bp meds if bp does not tolerate, but is stable for now.  Hold ivf for now due to his ICM, chf which is well compensated at this time.  Place on tele.  Full code.  Mourad Cwikla A 05/10/2013, 4:14 AM

## 2013-05-10 NOTE — Progress Notes (Signed)
Patient admitted early this AM by Dr. Onalee Hua- no BMP- order for now.  Patient has PNA.  Leave in SDU overnight- trend CE.  CArds consulted  Arlee Santosuosso DO

## 2013-05-10 NOTE — ED Notes (Signed)
Admitting doctor called about the tachy.  She said  Call cards.  Cards called and we have given the pt adenocard 5mg  iv per dr Waynetta Sandy order.  Pt converted  To nsr

## 2013-05-10 NOTE — ED Notes (Signed)
The pt started sudden onset of svt pt had been asleep awakened and asked to do a  Valsalva maneuver with no change in his rhy.  No chest pain just some rt sided lat abd pain.  Aspirin and lasix given.  Call placed to the admitting doctor

## 2013-05-10 NOTE — Progress Notes (Signed)
  Echocardiogram 2D Echocardiogram has been performed.  Cabot Cromartie FRANCES 05/10/2013, 5:12 PM

## 2013-05-10 NOTE — Progress Notes (Addendum)
CRITICAL VALUE ALERT  Critical value received:  Troponin 0.51 Date of notification:  05/10/13 Time of notification:  1128 Critical value read back: YES Nurse who received alert: Jeannine Kitten RN MD notified (1st page):  [place] Time of first page: 1130 MD notified (2nd page): Time of second page: Responding MD:  Alinda Money PA Time MD responded:  [phone]

## 2013-05-10 NOTE — ED Notes (Signed)
Tylenol given.  No chest pain

## 2013-05-10 NOTE — Progress Notes (Signed)
Utilization Review completed.  

## 2013-05-10 NOTE — ED Notes (Signed)
Admitting doctor called   pts bed changed to a stepdown bed.  Waiting for an assignment

## 2013-05-10 NOTE — Progress Notes (Signed)
Patient ID: [name], male   DOB: [birth year], 47 y.o.   MRN: [id number]    SUBJECTIVE: Breathing better.  Coughing a lot.  Chest hurts when he coughs but no chest pain otherwise.  BP is stable (diastolic elevated) today.   Marland Kitchen aspirin  81 mg Oral Daily  . azithromycin  500 mg Intravenous QHS  . carvedilol  6.25 mg Oral BID WC  . cefTRIAXone (ROCEPHIN)  IV  1 g Intravenous QHS  . enoxaparin (LOVENOX) injection  40 mg Subcutaneous Daily  . furosemide  40 mg Oral BID  . hydrALAZINE  25 mg Oral Q8H  . isosorbide mononitrate  30 mg Oral Daily  . ranolazine  500 mg Oral BID  . simvastatin  20 mg Oral q1800  . sodium chloride  3 mL Intravenous Q12H      Filed Vitals:   05/10/13 0700 05/10/13 0800 05/10/13 0829 05/10/13 0900  BP: 122/85 132/88 128/90 129/99  Pulse: 88 47 83 94  Temp:   99.4 F (37.4 C)   TempSrc:   Oral   Resp: 20 20 22 20   Height:  5\' 10"  (1.778 m)    Weight:  104 kg (229 lb 4.5 oz)    SpO2: 96% 98% 100% 99%    Intake/Output Summary (Last 24 hours) at 05/10/13 1008 Last data filed at 05/10/13 0800  Gross per 24 hour  Intake    360 ml  Output   1300 ml  Net   -940 ml    LABS: Basic Metabolic Panel:  Recent Labs  91/47/82 0405  NA 129*  K 4.2  CL 92*  CO2 25  GLUCOSE 156*  BUN 25*  CREATININE 3.15*  CALCIUM 8.2*   Liver Function Tests:  Recent Labs  05/10/13 0045  AST 43*  ALT 18  ALKPHOS 57  BILITOT 1.1  PROT 7.3  ALBUMIN 2.7*   No results found for this basename: LIPASE, AMYLASE,  in the last 72 hours CBC:  Recent Labs  05/10/13 0410  WBC 13.8*  HGB 11.8*  HCT 35.1*  MCV 83.4  PLT 187   Cardiac Enzymes: No results found for this basename: CKTOTAL, CKMB, CKMBINDEX, TROPONINI,  in the last 72 hours BNP: No components found with this basename: POCBNP,  D-Dimer: No results found for this basename: DDIMER,  in the last 72 hours Hemoglobin A1C: No results found for this basename: HGBA1C,  in the last 72 hours Fasting  Lipid Panel: No results found for this basename: CHOL, HDL, LDLCALC, TRIG, CHOLHDL, LDLDIRECT,  in the last 72 hours Thyroid Function Tests: No results found for this basename: TSH, T4TOTAL, FREET3, T3FREE, THYROIDAB,  in the last 72 hours Anemia Panel: No results found for this basename: VITAMINB12, FOLATE, FERRITIN, TIBC, IRON, RETICCTPCT,  in the last 72 hours  RADIOLOGY: Dg Chest 2 View  05/10/2013   *RADIOLOGY REPORT*  Clinical Data: Loss of consciousness and fall.  Temperature for 2 days.  CHEST - 2 VIEW  Comparison: 08/28/2012  Findings: There is increased density in the lung bases particularly on the right with air bronchograms.  Changes are consistent with pneumonia although atelectasis or consolidation can also have this appearance.  There is progression since the previous study.  Mild cardiac enlargement with normal pulmonary vascularity.  No blunting of costophrenic angles.  No pneumothorax.  Mediastinal contours appear intact.  IMPRESSION: Focal consolidation in the right lung base suggest pneumonia. Atelectasis or infiltration also demonstrated in the left lung base.   Original  Report Authenticated By: Burman Nieves, M.D.   Ct Head Wo Contrast  05/10/2013   *RADIOLOGY REPORT*  Clinical Data: Temperature for 2 days.  Fall earlier today, striking nose on the carpet.  Headache.  Cough with white sputum.  CT HEAD WITHOUT CONTRAST  Technique:  Contiguous axial images were obtained from the base of the skull through the vertex without contrast.  Comparison: 03/28/2010  Findings: The ventricles and sulci are symmetrical without significant effacement, displacement, or dilatation. No mass effect or midline shift. No abnormal extra-axial fluid collections. The grey-white matter junction is distinct. Basal cisterns are not effaced. No acute intracranial hemorrhage. No depressed skull fractures.  Visualized paranasal sinuses and mastoid air cells are not opacified.  Vascular calcifications.  No  significant changes since the previous study.  IMPRESSION: No acute intracranial abnormalities.   Original Report Authenticated By: Burman Nieves, M.D.    PHYSICAL EXAM General: NAD Neck: Thick, JVP 8-9 cm, no thyromegaly or thyroid nodule.  Lungs: Crackles/bronchial breath sounds on right. CV: Nondisplaced PMI.  Heart regular S1/S2, no S3/S4, no murmur.  No peripheral edema.  No carotid bruit.   Abdomen: Soft, nontender, no hepatosplenomegaly, no distention.  Neurologic: Alert and oriented x 3.  Psych: Normal affect. Extremities: No clubbing or cyanosis.   TELEMETRY: Reviewed telemetry pt in NSR  ASSESSMENT AND PLAN: 47 yo with history of CKD, HTN, CAD, and ischemic cardiomyopathy presented with PNA.  Cardiology consulted because of elevated troponin.  1. PNA: RLL PNA by exam, CXR.  Tmax 102.6 overnight.  Treating with ceftriaxone and azithromycin.  2. Elevated troponin: No chest pain except when he coughs.  TnI mildly elevated at 0.43 initially.  Possible demand ischemia in setting of sepsis/some degree of volume overload.  Known CAD with occluded RCA.  - Add ASA 81 now - Follow troponin curve: if significant upward trend, can add heparin gtt - Restart home statin and restart beta blocker.  3. CHF: Last echo in 3/13 with EF 45%.  On exam, patient is probably somewhat volume overloaded.  BNP is very high but he also has significant CKD so do not know where baseline would be. Creatinine is a bit higher than his prior baseline.  - Will use Lasix 40 mg IV bid for now, follow creatinine closely. - Restart Coreg but use lower dose given earlier concern for sepsis/low BP.  Similarly, will restart hydralazine/nitrates but at lower dose.  Can be titrated up to baseline doses if BP remains stable.  - Echo to reassess LV systolic function.  4. CKD: Creatinine most recently was 2.9, now 3.1.  As above will use IV Lasix for now but follow creatinine closely.  5. SVT: Episode of SVT yesterday  terminated with adenosine apparently.  Starting back on Coreg.   Marca Ancona 05/10/2013 10:22 AM

## 2013-05-11 DIAGNOSIS — J690 Pneumonitis due to inhalation of food and vomit: Secondary | ICD-10-CM

## 2013-05-11 DIAGNOSIS — I471 Supraventricular tachycardia: Secondary | ICD-10-CM

## 2013-05-11 DIAGNOSIS — N179 Acute kidney failure, unspecified: Secondary | ICD-10-CM

## 2013-05-11 DIAGNOSIS — I5042 Chronic combined systolic (congestive) and diastolic (congestive) heart failure: Secondary | ICD-10-CM

## 2013-05-11 DIAGNOSIS — R7989 Other specified abnormal findings of blood chemistry: Secondary | ICD-10-CM

## 2013-05-11 DIAGNOSIS — J189 Pneumonia, unspecified organism: Principal | ICD-10-CM | POA: Diagnosis present

## 2013-05-11 LAB — POCT I-STAT TROPONIN I: Troponin i, poc: 0.37 ng/mL (ref 0.00–0.08)

## 2013-05-11 LAB — GLUCOSE, CAPILLARY: Glucose-Capillary: 130 mg/dL — ABNORMAL HIGH (ref 70–99)

## 2013-05-11 LAB — CBC
Platelets: 174 10*3/uL (ref 150–400)
RDW: 15 % (ref 11.5–15.5)
WBC: 10 10*3/uL (ref 4.0–10.5)

## 2013-05-11 LAB — BASIC METABOLIC PANEL
Chloride: 87 mEq/L — ABNORMAL LOW (ref 96–112)
GFR calc Af Amer: 19 mL/min — ABNORMAL LOW (ref >90)
Potassium: 4 mEq/L (ref 3.5–5.1)
Sodium: 127 mEq/L — ABNORMAL LOW (ref 135–145)

## 2013-05-11 LAB — URINE CULTURE
Colony Count: NO GROWTH
Culture: NO GROWTH

## 2013-05-11 MED ORDER — ACETAMINOPHEN 325 MG PO TABS
650.0000 mg | ORAL_TABLET | ORAL | Status: DC | PRN
  Administered 2013-05-11: 650 mg via ORAL
  Filled 2013-05-11: qty 2

## 2013-05-11 MED ORDER — ENOXAPARIN SODIUM 30 MG/0.3ML ~~LOC~~ SOLN
30.0000 mg | Freq: Every day | SUBCUTANEOUS | Status: DC
  Administered 2013-05-12 – 2013-05-14 (×3): 30 mg via SUBCUTANEOUS
  Filled 2013-05-11 (×3): qty 0.3

## 2013-05-11 MED ORDER — SODIUM CHLORIDE 0.9 % IV SOLN
INTRAVENOUS | Status: DC
  Administered 2013-05-11: 08:00:00 via INTRAVENOUS

## 2013-05-11 MED ORDER — ONDANSETRON HCL 4 MG/2ML IJ SOLN: 4.0000 mg | Freq: Four times a day (QID) | INTRAMUSCULAR | Status: DC | PRN

## 2013-05-11 MED ORDER — GI COCKTAIL ~~LOC~~
30.0000 mL | Freq: Once | ORAL | Status: AC
  Administered 2013-05-11: 30 mL via ORAL
  Filled 2013-05-11: qty 30

## 2013-05-11 NOTE — Progress Notes (Signed)
[facility] PROGRESS NOTE  Geoff Dacanay EXB:[id number] DOB: Jul 12, 1966 DOA: 05/09/2013 PCP: Josue Hector, MD  Assessment/Plan: PNA RLL: ceftriaxone and azithromycin, nebs  Elevated troponin: cards following due to increased troponins. Known CAD with occluded RCA.  - ASA 81  -statin -BB  ? CHF: BNP elevated, given lasix IV yest 40mg  BID- stopped today and gentle IVF while we await echo and recheck Cr -does not appear volume overloaded  CKD: baseline around 2-3; IVF and recheck in AM  SVT: resolved with adenosine- add BB    Code Status: full Family Communication: patient at bedside Disposition Plan: tx to tele in AM   Consultants:  cards  Procedures:    Antibiotics:    HPI/Subjective: Dry mouth Cough better  Objective: Filed Vitals:   05/11/13 0700  BP: 105/63  Pulse: 79  Temp: 99 F (37.2 C)  Resp: 19    Intake/Output Summary (Last 24 hours) at 05/11/13 0754 Last data filed at 05/11/13 0600  Gross per 24 hour  Intake   1080 ml  Output   3252 ml  Net  -2172 ml   Filed Weights   05/10/13 0800 05/11/13 0416  Weight: 104 kg (229 lb 4.5 oz) 104.4 kg (230 lb 2.6 oz)    Exam:   General:  A+Ox3, NAD  Cardiovascular: rrr  Respiratory: mild wheeze and ronchi  Abdomen: +BS, soft  Musculoskeletal: no edema   Data Reviewed: Basic Metabolic Panel:  Recent Labs Lab 05/10/13 0405 05/10/13 0945 05/11/13 0530  NA 129* 131* 127*  K 4.2 4.1 4.0  CL 92* 92* 87*  CO2 25 27 26   GLUCOSE 156* 171* 137*  BUN 25* 26* 39*  CREATININE 3.15* 3.06* 4.07*  CALCIUM 8.2* 8.2* 8.1*   Liver Function Tests:  Recent Labs Lab 05/10/13 0045  AST 43*  ALT 18  ALKPHOS 57  BILITOT 1.1  PROT 7.3  ALBUMIN 2.7*   No results found for this basename: LIPASE, AMYLASE,  in the last 168 hours No results found for this basename: AMMONIA,  in the last 168 hours CBC:  Recent Labs Lab 05/10/13 0410 05/11/13 0530  WBC 13.8* 10.0  HGB 11.8*  11.5*  HCT 35.1* 33.6*  MCV 83.4 82.8  PLT 187 174   Cardiac Enzymes:  Recent Labs Lab 05/10/13 0945 05/10/13 1615 05/10/13 2240  TROPONINI 0.51* 0.40* 0.46*   BNP (last 3 results)  Recent Labs  05/10/13 0045  PROBNP 27237.0*   CBG:  Recent Labs Lab 05/10/13 0255  GLUCAP 191*    Recent Results (from the past 240 hour(s))  MRSA PCR SCREENING     Status: None   Collection Time    05/10/13  6:34 AM      Result Value Range Status   MRSA by PCR NEGATIVE  NEGATIVE Final   Comment:            The GeneXpert MRSA Assay (FDA     approved for NASAL specimens     only), is one component of a     comprehensive MRSA colonization     surveillance program. It is not     intended to diagnose MRSA     infection nor to guide or     monitor treatment for     MRSA infections.     Studies: Dg Chest 2 View  05/10/2013   *RADIOLOGY REPORT*  Clinical Data: Loss of consciousness and fall.  Temperature for 2 days.  CHEST - 2 VIEW  Comparison: 08/28/2012  Findings: There is increased density in the lung bases particularly on the right with air bronchograms.  Changes are consistent with pneumonia although atelectasis or consolidation can also have this appearance.  There is progression since the previous study.  Mild cardiac enlargement with normal pulmonary vascularity.  No blunting of costophrenic angles.  No pneumothorax.  Mediastinal contours appear intact.  IMPRESSION: Focal consolidation in the right lung base suggest pneumonia. Atelectasis or infiltration also demonstrated in the left lung base.   Original Report Authenticated By: Burman Nieves, M.D.   Ct Head Wo Contrast  05/10/2013   *RADIOLOGY REPORT*  Clinical Data: Temperature for 2 days.  Fall earlier today, striking nose on the carpet.  Headache.  Cough with white sputum.  CT HEAD WITHOUT CONTRAST  Technique:  Contiguous axial images were obtained from the base of the skull through the vertex without contrast.  Comparison:  03/28/2010  Findings: The ventricles and sulci are symmetrical without significant effacement, displacement, or dilatation. No mass effect or midline shift. No abnormal extra-axial fluid collections. The grey-white matter junction is distinct. Basal cisterns are not effaced. No acute intracranial hemorrhage. No depressed skull fractures.  Visualized paranasal sinuses and mastoid air cells are not opacified.  Vascular calcifications.  No significant changes since the previous study.  IMPRESSION: No acute intracranial abnormalities.   Original Report Authenticated By: Burman Nieves, M.D.    Scheduled Meds: . aspirin  81 mg Oral Daily  . azithromycin  500 mg Intravenous QHS  . carvedilol  6.25 mg Oral BID WC  . cefTRIAXone (ROCEPHIN)  IV  1 g Intravenous QHS  . enoxaparin (LOVENOX) injection  40 mg Subcutaneous Daily  . hydrALAZINE  25 mg Oral Q8H  . isosorbide mononitrate  30 mg Oral Daily  . ranolazine  500 mg Oral BID  . simvastatin  20 mg Oral q1800  . sodium chloride  3 mL Intravenous Q12H   Continuous Infusions: . sodium chloride      Principal Problem:   CAP (community acquired pneumonia) Active Problems:   CAD   Syncope   Acute on chronic kidney disease, stage 3   Acute on chronic systolic CHF (congestive heart failure)   SVT (supraventricular tachycardia)   Elevated troponin    Time spent: 35    [name]  [facility] Pager [phone]. If 7PM-7AM, please contact night-coverage at [email or web address], password [facility] 05/11/2013, 7:54 AM  LOS: 2 days

## 2013-05-11 NOTE — Progress Notes (Signed)
Patient ID: [name], male   DOB: October 01, 1965, 47 y.o.   MRN: [id number]   SUBJECTIVE: Patient is resting comfortably flat in bed.  Filed Vitals:   05/11/13 0600 05/11/13 0630 05/11/13 0638 05/11/13 0700  BP: 120/85 125/82 125/82 105/63  Pulse: 79 79 81 79  Temp:      TempSrc:      Resp: 20 21  19   Height:      Weight:      SpO2: 98% 98%  99%    Intake/Output Summary (Last 24 hours) at 05/11/13 0740 Last data filed at 05/11/13 0600  Gross per 24 hour  Intake   1080 ml  Output   3252 ml  Net  -2172 ml    LABS: Basic Metabolic Panel:  Recent Labs  91/47/82 0945 05/11/13 0530  NA 131* 127*  K 4.1 4.0  CL 92* 87*  CO2 27 26  GLUCOSE 171* 137*  BUN 26* 39*  CREATININE 3.06* 4.07*  CALCIUM 8.2* 8.1*   Liver Function Tests:  Recent Labs  05/10/13 0045  AST 43*  ALT 18  ALKPHOS 57  BILITOT 1.1  PROT 7.3  ALBUMIN 2.7*   No results found for this basename: LIPASE, AMYLASE,  in the last 72 hours CBC:  Recent Labs  05/10/13 0410 05/11/13 0530  WBC 13.8* 10.0  HGB 11.8* 11.5*  HCT 35.1* 33.6*  MCV 83.4 82.8  PLT 187 174   Cardiac Enzymes:  Recent Labs  05/10/13 0945 05/10/13 1615 05/10/13 2240  TROPONINI 0.51* 0.40* 0.46*   BNP: No components found with this basename: POCBNP,  D-Dimer: No results found for this basename: DDIMER,  in the last 72 hours Hemoglobin A1C: No results found for this basename: HGBA1C,  in the last 72 hours Fasting Lipid Panel: No results found for this basename: CHOL, HDL, LDLCALC, TRIG, CHOLHDL, LDLDIRECT,  in the last 72 hours Thyroid Function Tests: No results found for this basename: TSH, T4TOTAL, FREET3, T3FREE, THYROIDAB,  in the last 72 hours  RADIOLOGY: Dg Chest 2 View  05/10/2013   *RADIOLOGY REPORT*  Clinical Data: Loss of consciousness and fall.  Temperature for 2 days.  CHEST - 2 VIEW  Comparison: 08/28/2012  Findings: There is increased density in the lung bases particularly on the right with air  bronchograms.  Changes are consistent with pneumonia although atelectasis or consolidation can also have this appearance.  There is progression since the previous study.  Mild cardiac enlargement with normal pulmonary vascularity.  No blunting of costophrenic angles.  No pneumothorax.  Mediastinal contours appear intact.  IMPRESSION: Focal consolidation in the right lung base suggest pneumonia. Atelectasis or infiltration also demonstrated in the left lung base.   Original Report Authenticated By: Burman Nieves, M.D.   Ct Head Wo Contrast  05/10/2013   *RADIOLOGY REPORT*  Clinical Data: Temperature for 2 days.  Fall earlier today, striking nose on the carpet.  Headache.  Cough with white sputum.  CT HEAD WITHOUT CONTRAST  Technique:  Contiguous axial images were obtained from the base of the skull through the vertex without contrast.  Comparison: 03/28/2010  Findings: The ventricles and sulci are symmetrical without significant effacement, displacement, or dilatation. No mass effect or midline shift. No abnormal extra-axial fluid collections. The grey-white matter junction is distinct. Basal cisterns are not effaced. No acute intracranial hemorrhage. No depressed skull fractures.  Visualized paranasal sinuses and mastoid air cells are not opacified.  Vascular calcifications.  No significant changes since the previous study.  IMPRESSION: No acute intracranial abnormalities.   Original Report Authenticated By: Burman Nieves, M.D.    PHYSICAL EXAM   patient is stable lying flat in bed. There is no jugulovenous distention. Lungs reveal scattered rhonchi. Cardiac exam reveals S1 and S2. The abdomen is soft. There may be trace peripheral edema.   TELEMETRY: I have reviewed telemetry today May 11, 2013. There is normal sinus rhythm with a rate of 70.   ASSESSMENT AND PLAN:    CAP (community acquired pneumonia)     It appears that this is one of the admitting diagnoses.    CAD    The troponin is  mildly elevated. The troponin values are flat. This argues against an acute cardiac event.      Acute on chronic kidney disease, stage 3     The patient's creatinine has increased substantially up to 4. It appears that his input and output is negative for yesterday. I do not see any diuretics ordered now. He will need gentle hydration and careful followup of his renal function.     Acute on chronic systolic CHF (congestive heart failure)     There is question on admission of some volume overload. At this time he does not appear to be volume overloaded. There are no diuretics ordered at this time. His volume status needs to be followed carefully along with his renal function. Two-dimensional echo is pending.    SVT (supraventricular tachycardia)      By report there were supraventricular tachycardia that converted to sinus with adenosine. He's not had any further arrhythmias since yesterday.    Elevated troponin     There is mild troponin elevation with a flat pattern. This may be chronic or do to his other acute illnesses. Two-dimensional echo is still pending. At this time there is no plan for cardiac catheterization.  Willa Rough 05/11/2013 7:40 AM

## 2013-05-12 ENCOUNTER — Inpatient Hospital Stay (HOSPITAL_COMMUNITY): Payer: Medicare Other

## 2013-05-12 LAB — BASIC METABOLIC PANEL
BUN: 50 mg/dL — ABNORMAL HIGH (ref 6–23)
GFR calc non Af Amer: 14 mL/min — ABNORMAL LOW (ref >90)
Glucose, Bld: 128 mg/dL — ABNORMAL HIGH (ref 70–99)
Potassium: 3.4 mEq/L — ABNORMAL LOW (ref 3.5–5.1)

## 2013-05-12 MED ORDER — SODIUM CHLORIDE 0.9 % IV SOLN: INTRAVENOUS | Status: DC

## 2013-05-12 MED ORDER — POTASSIUM CHLORIDE CRYS ER 20 MEQ PO TBCR
40.0000 meq | EXTENDED_RELEASE_TABLET | Freq: Once | ORAL | Status: AC
  Administered 2013-05-12: 40 meq via ORAL
  Filled 2013-05-12: qty 2

## 2013-05-12 NOTE — Progress Notes (Signed)
Patient ID: [name], male   DOB: May 13, 1966, 47 y.o.   MRN: [id number]    SUBJECTIVE: Pt is a 47 yo with hx of CAD, ischemic CM with EF 15% admitted with cough and wheezing on 9/7.  Tmax 2 nights ago was 102.6.  Troponin is mildly elevated.  Creatinine continues to slowly increase - 4.07 this am.    . aspirin  81 mg Oral Daily  . azithromycin  500 mg Intravenous QHS  . carvedilol  6.25 mg Oral BID WC  . cefTRIAXone (ROCEPHIN)  IV  1 g Intravenous QHS  . enoxaparin (LOVENOX) injection  30 mg Subcutaneous Daily  . isosorbide mononitrate  30 mg Oral Daily  . ranolazine  500 mg Oral BID  . simvastatin  20 mg Oral q1800  . sodium chloride  3 mL Intravenous Q12H      Filed Vitals:   05/12/13 0036 05/12/13 0453 05/12/13 0736 05/12/13 0823  BP: 94/52 108/66 114/69 105/66  Pulse: 72 71 79 80  Temp: 97.6 F (36.4 C) 97.8 F (36.6 C) 99.1 F (37.3 C)   TempSrc: Oral Oral Oral   Resp: 13 12 16    Height:      Weight:  230 lb 2.6 oz (104.4 kg)    SpO2: 93% 98% 98%     Intake/Output Summary (Last 24 hours) at 05/12/13 0900 Last data filed at 05/12/13 0600  Gross per 24 hour  Intake   1560 ml  Output   1325 ml  Net    235 ml    LABS: Basic Metabolic Panel:  Recent Labs  56/21/30 0945 05/11/13 0530  NA 131* 127*  K 4.1 4.0  CL 92* 87*  CO2 27 26  GLUCOSE 171* 137*  BUN 26* 39*  CREATININE 3.06* 4.07*  CALCIUM 8.2* 8.1*   Liver Function Tests:  Recent Labs  05/10/13 0045  AST 43*  ALT 18  ALKPHOS 57  BILITOT 1.1  PROT 7.3  ALBUMIN 2.7*   No results found for this basename: LIPASE, AMYLASE,  in the last 72 hours CBC:  Recent Labs  05/10/13 0410 05/11/13 0530  WBC 13.8* 10.0  HGB 11.8* 11.5*  HCT 35.1* 33.6*  MCV 83.4 82.8  PLT 187 174   Cardiac Enzymes:  Recent Labs  05/10/13 0945 05/10/13 1615 05/10/13 2240  TROPONINI 0.51* 0.40* 0.46*   BNP: No components found with this basename: POCBNP,  D-Dimer: No results found for this  basename: DDIMER,  in the last 72 hours Hemoglobin A1C: No results found for this basename: HGBA1C,  in the last 72 hours Fasting Lipid Panel: No results found for this basename: CHOL, HDL, LDLCALC, TRIG, CHOLHDL, LDLDIRECT,  in the last 72 hours Thyroid Function Tests: No results found for this basename: TSH, T4TOTAL, FREET3, T3FREE, THYROIDAB,  in the last 72 hours Anemia Panel: No results found for this basename: VITAMINB12, FOLATE, FERRITIN, TIBC, IRON, RETICCTPCT,  in the last 72 hours  RADIOLOGY: Dg Chest 2 View  05/10/2013   *RADIOLOGY REPORT*  Clinical Data: Loss of consciousness and fall.  Temperature for 2 days.  CHEST - 2 VIEW  Comparison: 08/28/2012  Findings: There is increased density in the lung bases particularly on the right with air bronchograms.  Changes are consistent with pneumonia although atelectasis or consolidation can also have this appearance.  There is progression since the previous study.  Mild cardiac enlargement with normal pulmonary vascularity.  No blunting of costophrenic angles.  No pneumothorax.  Mediastinal contours appear  intact.  IMPRESSION: Focal consolidation in the right lung base suggest pneumonia. Atelectasis or infiltration also demonstrated in the left lung base.   Original Report Authenticated By: Burman Nieves, M.D.   Ct Head Wo Contrast  05/10/2013   *RADIOLOGY REPORT*  Clinical Data: Temperature for 2 days.  Fall earlier today, striking nose on the carpet.  Headache.  Cough with white sputum.  CT HEAD WITHOUT CONTRAST  Technique:  Contiguous axial images were obtained from the base of the skull through the vertex without contrast.  Comparison: 03/28/2010  Findings: The ventricles and sulci are symmetrical without significant effacement, displacement, or dilatation. No mass effect or midline shift. No abnormal extra-axial fluid collections. The grey-white matter junction is distinct. Basal cisterns are not effaced. No acute intracranial hemorrhage. No  depressed skull fractures.  Visualized paranasal sinuses and mastoid air cells are not opacified.  Vascular calcifications.  No significant changes since the previous study.  IMPRESSION: No acute intracranial abnormalities.   Original Report Authenticated By: Burman Nieves, M.D.    PHYSICAL EXAM General: NAD Neck: Thick, JVP 8-9 cm, no thyromegaly or thyroid nodule.  Lungs: Crackles/bronchial breath sounds on right. CV: Nondisplaced PMI.  Heart regular S1/S2, no S3/S4, no murmur.  No peripheral edema.  No carotid bruit.   Abdomen: Soft, nontender, no hepatosplenomegaly, no distention.  Neurologic: Alert and oriented x 3.  Psych: Normal affect. Extremities: No clubbing or cyanosis.   TELEMETRY: Reviewed telemetry pt in NSR  ASSESSMENT AND PLAN: 47 yo with history of CKD, HTN, CAD, and ischemic cardiomyopathy presented with PNA.  Cardiology consulted because of elevated troponin.  1. PNA: RLL PNA by exam, CXR.  Tmax 99.1 overnight.  Treating with ceftriaxone and azithromycin.   2. Elevated troponin: No chest pain except when he coughs.  TnI mildly elevated at 0.43 initially.  Known CAD with occluded RCA.    It is possible that the elevated Troponin level is related to his acute renal insufficiency.  3. Chronic systolic CHF: his EF is 15% by echo.   Creatinine is a bit higher than his prior baseline.  Lasix is currently on hold due to his worsening renal insufficiency.  He is back on coreg and Imdur.   4. CKD: Creatinine most recently was 2.9, now 4.07.    [name]. 05/12/2013 9:00 AM

## 2013-05-12 NOTE — Progress Notes (Addendum)
[facility] PROGRESS NOTE  [name] ZOX:[id number] DOB: June 28, 1966 DOA: 05/09/2013 PCP: Josue Hector, MD  Assessment/Plan: PNA RLL: ceftriaxone and azithromycin, nebs  Elevated troponin: cards following due to increased troponins. Known CAD with occluded RCA.  - ASA 81  -statin -BB  ? CHF: BNP elevated, given lasix IV yest 40mg  BID- stopped today and gentle IVF while we await echo and recheck Cr- EF 15 % -does not appear volume overloaded- await Cr for today  CKD: baseline around 2-3- check renal U/S; gentle IVF- if not better in AM, may need nephrology (combo of lasix given as well as low BP)  SVT: resolved with adenosine- add BB    Code Status: full Family Communication: patient at bedside Disposition Plan: may transfer to tele after labs today   Consultants:  cards  Procedures:    Antibiotics:    HPI/Subjective: No SOB, no CP No fever, no chills  Objective: Filed Vitals:   05/12/13 0736  BP: 114/69  Pulse: 79  Temp: 99.1 F (37.3 C)  Resp: 16    Intake/Output Summary (Last 24 hours) at 05/12/13 0754 Last data filed at 05/12/13 0600  Gross per 24 hour  Intake   1920 ml  Output   1325 ml  Net    595 ml   Filed Weights   05/10/13 0800 05/11/13 0416 05/12/13 0453  Weight: 104 kg (229 lb 4.5 oz) 104.4 kg (230 lb 2.6 oz) 104.4 kg (230 lb 2.6 oz)    Exam:   General:  A+Ox3, NAD  Cardiovascular: rrr  Respiratory: mild wheeze and ronchi  Abdomen: +BS, soft  Musculoskeletal: no edema   Data Reviewed: Basic Metabolic Panel:  Recent Labs Lab 05/10/13 0405 05/10/13 0945 05/11/13 0530  NA 129* 131* 127*  K 4.2 4.1 4.0  CL 92* 92* 87*  CO2 25 27 26   GLUCOSE 156* 171* 137*  BUN 25* 26* 39*  CREATININE 3.15* 3.06* 4.07*  CALCIUM 8.2* 8.2* 8.1*   Liver Function Tests:  Recent Labs Lab 05/10/13 0045  AST 43*  ALT 18  ALKPHOS 57  BILITOT 1.1  PROT 7.3  ALBUMIN 2.7*   No results found for this basename:  LIPASE, AMYLASE,  in the last 168 hours No results found for this basename: AMMONIA,  in the last 168 hours CBC:  Recent Labs Lab 05/10/13 0410 05/11/13 0530  WBC 13.8* 10.0  HGB 11.8* 11.5*  HCT 35.1* 33.6*  MCV 83.4 82.8  PLT 187 174   Cardiac Enzymes:  Recent Labs Lab 05/10/13 0945 05/10/13 1615 05/10/13 2240  TROPONINI 0.51* 0.40* 0.46*   BNP (last 3 results)  Recent Labs  05/10/13 0045  PROBNP 27237.0*   CBG:  Recent Labs Lab 05/10/13 0255 05/11/13 0747 05/11/13 1610  GLUCAP 191* 130* 137*    Recent Results (from the past 240 hour(s))  CULTURE, BLOOD (ROUTINE X 2)     Status: None   Collection Time    05/10/13 12:35 AM      Result Value Range Status   Specimen Description BLOOD RIGHT ARM   Final   Special Requests BOTTLES DRAWN AEROBIC AND ANAEROBIC 10CC EA   Final   Culture  Setup Time     Final   Value: 05/10/2013 06:20     Performed at [facility]   Culture     Final   Value:        BLOOD CULTURE RECEIVED NO GROWTH TO DATE CULTURE WILL BE HELD FOR 5 DAYS BEFORE  ISSUING A FINAL NEGATIVE REPORT     Performed at [facility]   Report Status PENDING   Incomplete  CULTURE, BLOOD (ROUTINE X 2)     Status: None   Collection Time    05/10/13 12:45 AM      Result Value Range Status   Specimen Description BLOOD RIGHT ARM   Final   Special Requests BOTTLES DRAWN AEROBIC AND ANAEROBIC 10CC EA   Final   Culture  Setup Time     Final   Value: 05/10/2013 06:20     Performed at [facility]   Culture     Final   Value:        BLOOD CULTURE RECEIVED NO GROWTH TO DATE CULTURE WILL BE HELD FOR 5 DAYS BEFORE ISSUING A FINAL NEGATIVE REPORT     Performed at [facility]   Report Status PENDING   Incomplete  URINE CULTURE     Status: None   Collection Time    05/10/13  4:04 AM      Result Value Range Status   Specimen Description URINE, RANDOM   Final   Special Requests ADDED [id number]   Final   Culture  Setup Time     Final    Value: 05/10/2013 06:00     Performed at [facility] Count     Final   Value: NO GROWTH     Performed at [facility]   Culture     Final   Value: NO GROWTH     Performed at [facility]   Report Status 05/11/2013 FINAL   Final  MRSA PCR SCREENING     Status: None   Collection Time    05/10/13  6:34 AM      Result Value Range Status   MRSA by PCR NEGATIVE  NEGATIVE Final   Comment:            The GeneXpert MRSA Assay (FDA     approved for NASAL specimens     only), is one component of a     comprehensive MRSA colonization     surveillance program. It is not     intended to diagnose MRSA     infection nor to guide or     monitor treatment for     MRSA infections.     Studies: No results found.  Scheduled Meds: . aspirin  81 mg Oral Daily  . azithromycin  500 mg Intravenous QHS  . carvedilol  6.25 mg Oral BID WC  . cefTRIAXone (ROCEPHIN)  IV  1 g Intravenous QHS  . enoxaparin (LOVENOX) injection  30 mg Subcutaneous Daily  . hydrALAZINE  25 mg Oral Q8H  . isosorbide mononitrate  30 mg Oral Daily  . ranolazine  500 mg Oral BID  . simvastatin  20 mg Oral q1800  . sodium chloride  3 mL Intravenous Q12H   Continuous Infusions:    Principal Problem:   CAP (community acquired pneumonia) Active Problems:   CAD   Syncope   Acute on chronic kidney disease, stage 3   Acute on chronic systolic CHF (congestive heart failure)   SVT (supraventricular tachycardia)   Elevated troponin    Time spent: 35    Euline Kimbler  [facility] Pager [phone]. If 7PM-7AM, please contact night-coverage at [email or web address], password [facility] 05/12/2013, 7:54 AM  LOS: 3 days

## 2013-05-13 ENCOUNTER — Telehealth: Payer: Self-pay | Admitting: *Deleted

## 2013-05-13 ENCOUNTER — Other Ambulatory Visit: Payer: Self-pay

## 2013-05-13 DIAGNOSIS — D649 Anemia, unspecified: Secondary | ICD-10-CM

## 2013-05-13 DIAGNOSIS — J984 Other disorders of lung: Secondary | ICD-10-CM

## 2013-05-13 LAB — BASIC METABOLIC PANEL
BUN: 49 mg/dL — ABNORMAL HIGH (ref 6–23)
Calcium: 8.1 mg/dL — ABNORMAL LOW (ref 8.4–10.5)
Creatinine, Ser: 3.59 mg/dL — ABNORMAL HIGH (ref 0.50–1.35)
GFR calc non Af Amer: 19 mL/min — ABNORMAL LOW (ref >90)
Glucose, Bld: 131 mg/dL — ABNORMAL HIGH (ref 70–99)
Sodium: 130 mEq/L — ABNORMAL LOW (ref 135–145)

## 2013-05-13 LAB — CBC
Hemoglobin: 11.5 g/dL — ABNORMAL LOW (ref 13.0–17.0)
MCH: 28.3 pg (ref 26.0–34.0)
MCHC: 34.1 g/dL (ref 30.0–36.0)

## 2013-05-13 MED ORDER — SPIRONOLACTONE 25 MG PO TABS: 12.5000 mg | ORAL_TABLET | Freq: Every day | ORAL | Status: DC

## 2013-05-13 MED ORDER — AZITHROMYCIN 500 MG PO TABS
500.0000 mg | ORAL_TABLET | Freq: Every day | ORAL | Status: DC
  Administered 2013-05-13 – 2013-05-15 (×2): 500 mg via ORAL
  Filled 2013-05-13 (×4): qty 1

## 2013-05-13 NOTE — Progress Notes (Signed)
Patient ID: [name], male   DOB: 1966-06-30, 47 y.o.   MRN: [id number]   SUBJECTIVE:  The patient remains off diuretics. His weight is stable. The input and output as recorded suggests negative input and output. I'm not sure that his total intake of 600 cc is correct. His renal function looks better today. It is not back to his baseline yet. He is still having some coughing in the evening.   Filed Vitals:   05/12/13 2100 05/13/13 0000 05/13/13 0400 05/13/13 0635  BP: 123/74 137/86  143/87  Pulse: 76 81  75  Temp: 98.7 F (37.1 C) 99.6 F (37.6 C) 99.2 F (37.3 C)   TempSrc: Oral Oral Oral   Resp: 21 30    Height:      Weight:   230 lb 2.6 oz (104.4 kg)   SpO2: 94% 97%      Intake/Output Summary (Last 24 hours) at 05/13/13 0711 Last data filed at 05/13/13 0600  Gross per 24 hour  Intake    600 ml  Output   2800 ml  Net  -2200 ml    LABS: Basic Metabolic Panel:  Recent Labs  78/29/56 0915 05/13/13 0427  NA 129* 130*  K 3.4* 4.1  CL 89* 95*  CO2 26 24  GLUCOSE 128* 131*  BUN 50* 49*  CREATININE 4.55* 3.59*  CALCIUM 8.1* 8.1*   Liver Function Tests: No results found for this basename: AST, ALT, ALKPHOS, BILITOT, PROT, ALBUMIN,  in the last 72 hours No results found for this basename: LIPASE, AMYLASE,  in the last 72 hours CBC:  Recent Labs  05/11/13 0530 05/13/13 0427  WBC 10.0 8.3  HGB 11.5* 11.5*  HCT 33.6* 33.7*  MCV 82.8 83.0  PLT 174 208   Cardiac Enzymes:  Recent Labs  05/10/13 0945 05/10/13 1615 05/10/13 2240  TROPONINI 0.51* 0.40* 0.46*   BNP: No components found with this basename: POCBNP,  D-Dimer: No results found for this basename: DDIMER,  in the last 72 hours Hemoglobin A1C: No results found for this basename: HGBA1C,  in the last 72 hours Fasting Lipid Panel: No results found for this basename: CHOL, HDL, LDLCALC, TRIG, CHOLHDL, LDLDIRECT,  in the last 72 hours Thyroid Function Tests: No results found for this basename:  TSH, T4TOTAL, FREET3, T3FREE, THYROIDAB,  in the last 72 hours  RADIOLOGY: Dg Chest 2 View  05/10/2013   *RADIOLOGY REPORT*  Clinical Data: Loss of consciousness and fall.  Temperature for 2 days.  CHEST - 2 VIEW  Comparison: 08/28/2012  Findings: There is increased density in the lung bases particularly on the right with air bronchograms.  Changes are consistent with pneumonia although atelectasis or consolidation can also have this appearance.  There is progression since the previous study.  Mild cardiac enlargement with normal pulmonary vascularity.  No blunting of costophrenic angles.  No pneumothorax.  Mediastinal contours appear intact.  IMPRESSION: Focal consolidation in the right lung base suggest pneumonia. Atelectasis or infiltration also demonstrated in the left lung base.   Original Report Authenticated By: Burman Nieves, M.D.   Ct Head Wo Contrast  05/10/2013   *RADIOLOGY REPORT*  Clinical Data: Temperature for 2 days.  Fall earlier today, striking nose on the carpet.  Headache.  Cough with white sputum.  CT HEAD WITHOUT CONTRAST  Technique:  Contiguous axial images were obtained from the base of the skull through the vertex without contrast.  Comparison: 03/28/2010  Findings: The ventricles and sulci are symmetrical without  significant effacement, displacement, or dilatation. No mass effect or midline shift. No abnormal extra-axial fluid collections. The grey-white matter junction is distinct. Basal cisterns are not effaced. No acute intracranial hemorrhage. No depressed skull fractures.  Visualized paranasal sinuses and mastoid air cells are not opacified.  Vascular calcifications.  No significant changes since the previous study.  IMPRESSION: No acute intracranial abnormalities.   Original Report Authenticated By: Burman Nieves, M.D.   US Renal  05/12/2013   *RADIOLOGY REPORT*  Clinical Data:  Acute kidney injury  RENAL/URINARY TRACT ULTRASOUND COMPLETE  Comparison:  Abdominal MRA  04/30/2010; CT scan of the abdomen and pelvis 01/01/2010  Findings:  Right Kidney:  Normal in size at 12 cm.  Echogenic renal parenchyma.  No focal solid lesion identified.  No evidence of mass or hydronephrosis.  Left Kidney:  Normal in size at 11.1 cm.  Echogenic renal parenchyma.  No focal solid lesion identified. No evidence of mass or hydronephrosis.  Bladder:  Appears normal for degree of bladder distention. Prostate gland is mildly enlarged at 3.7 x 2.7 x 4.5 cm.  IMPRESSION:  1.  Negative for hydronephrosis. 2.  Echogenic renal parenchyma bilaterally suggests underlying medical renal disease. 3.  Borderline prostatomegaly.   Original Report Authenticated By: Malachy Moan, M.D.    PHYSICAL EXAM  patient is oriented to person time and place. Affect is normal. There is no jugulovenous distention. He has scattered rhonchi. Cardiac exam  S1 and S2. There is no significant peripheral edema.   TELEMETRY:  I have reviewed telemetry today May 13, 2013. There is normal sinus rhythm.   ASSESSMENT AND PLAN:    CAP (community acquired pneumonia)      This is being treated. He continues to have some coughing in the evening.    Acute on chronic kidney disease, stage 3     Renal function is improving off diuretics at this time.    Acute on chronic systolic CHF (congestive heart failure)    His volume status appears to be stable. We have been holding his diuretics. Despite this his input and output is listed as negative. Renal function is improving. I would not restart his diuretics today. This decision needs to be made daily. Ultimately he will need to be back on his diuretics.        SVT (supraventricular tachycardia)     No further supraventricular tachycardia has been seen.    Elevated troponin     No further workup is planned for the elevated troponin. It is secondary to other problems.  Willa Rough 05/13/2013 7:11 AM

## 2013-05-13 NOTE — Progress Notes (Signed)
Patient ID: [name]  male  WUJ:[id number]    DOB: [birth year]    DOA: 05/09/2013  PCP: Josue Hector, MD  Assessment/Plan: Principal Problem:   CAP (community acquired pneumonia), right lower lung - Continue Zithromax, Rocephin, nebulizer treatment, O2  Active Problems:   CAD with elevated troponin, known coronary disease with occluded RCA - Cardiology following, continue aspirin, statin, beta blocker    Acute on chronic kidney disease, stage 3 - Creatinine trending down now, baseline 2-3. Renal ultrasound negative for any hydronephrosis. - Lasix currently on hold due to worsening renal insufficiency    SVT (supraventricular tachycardia)- resolved with adenosine.   DVT Prophylaxis:  Code Status:  Disposition:    Subjective: Denies any specific complaints, no chest pain Objective: Weight change: 0 kg (0 lb)  Intake/Output Summary (Last 24 hours) at 05/13/13 1349 Last data filed at 05/13/13 0600  Gross per 24 hour  Intake    480 ml  Output   1950 ml  Net  -1470 ml   Blood pressure 131/90, pulse 62, temperature 97.8 F (36.6 C), temperature source Oral, resp. rate 18, height 5\' 10"  (1.778 m), weight 104.4 kg (230 lb 2.6 oz), SpO2 97.00%.  Physical Exam: General: Alert and awake, oriented x3, not in any acute distress. HEENT: anicteric sclera, PERLA, EOMI CVS: S1-S2 clear, no murmur rubs or gallops Chest: clear to auscultation bilaterally, no wheezing, rales or rhonchi Abdomen: soft nontender, nondistended, normal bowel sounds  Extremities: no cyanosis, clubbinPNA RLL: ceftriaxone and azithromycin, nebs  Elevated troponin: cards following due to increased troponins. Known CAD with occluded RCA.  - ASA 81  -statin  -BB  ? CHF: BNP elevated, given lasix IV yest 40mg  BID- stopped today and gentle IVF while we await echo and recheck Cr- EF 15 %  -does not appear volume overloaded- await Cr for today  CKD: baseline around 2-3- check renal U/S; gentle IVF- if  not better in AM, may need nephrology (combo of lasix given as well as low BP)  SVT: resolved with adenosine- add BB    g or edema noted bilaterally Neuro: Cranial nerves II-XII intact, no focal neurological deficits  Lab Results: Basic Metabolic Panel:  Recent Labs Lab 05/12/13 0915 05/13/13 0427  NA 129* 130*  K 3.4* 4.1  CL 89* 95*  CO2 26 24  GLUCOSE 128* 131*  BUN 50* 49*  CREATININE 4.55* 3.59*  CALCIUM 8.1* 8.1*   Liver Function Tests:  Recent Labs Lab 05/10/13 0045  AST 43*  ALT 18  ALKPHOS 57  BILITOT 1.1  PROT 7.3  ALBUMIN 2.7*   No results found for this basename: LIPASE, AMYLASE,  in the last 168 hours No results found for this basename: AMMONIA,  in the last 168 hours CBC:  Recent Labs Lab 05/11/13 0530 05/13/13 0427  WBC 10.0 8.3  HGB 11.5* 11.5*  HCT 33.6* 33.7*  MCV 82.8 83.0  PLT 174 208   Cardiac Enzymes:  Recent Labs Lab 05/10/13 0945 05/10/13 1615 05/10/13 2240  TROPONINI 0.51* 0.40* 0.46*   BNP: No components found with this basename: POCBNP,  CBG:  Recent Labs Lab 05/10/13 0255 05/11/13 0747 05/11/13 1610  GLUCAP 191* 130* 137*     Micro Results: Recent Results (from the past 240 hour(s))  CULTURE, BLOOD (ROUTINE X 2)     Status: None   Collection Time    05/10/13 12:35 AM      Result Value Range Status   Specimen Description BLOOD RIGHT ARM  Final   Special Requests BOTTLES DRAWN AEROBIC AND ANAEROBIC 10CC EA   Final   Culture  Setup Time     Final   Value: 05/10/2013 06:20     Performed at [facility]   Culture     Final   Value:        BLOOD CULTURE RECEIVED NO GROWTH TO DATE CULTURE WILL BE HELD FOR 5 DAYS BEFORE ISSUING A FINAL NEGATIVE REPORT     Performed at [facility]   Report Status PENDING   Incomplete  CULTURE, BLOOD (ROUTINE X 2)     Status: None   Collection Time    05/10/13 12:45 AM      Result Value Range Status   Specimen Description BLOOD RIGHT ARM   Final   Special  Requests BOTTLES DRAWN AEROBIC AND ANAEROBIC 10CC EA   Final   Culture  Setup Time     Final   Value: 05/10/2013 06:20     Performed at [facility]   Culture     Final   Value:        BLOOD CULTURE RECEIVED NO GROWTH TO DATE CULTURE WILL BE HELD FOR 5 DAYS BEFORE ISSUING A FINAL NEGATIVE REPORT     Performed at [facility]   Report Status PENDING   Incomplete  URINE CULTURE     Status: None   Collection Time    05/10/13  4:04 AM      Result Value Range Status   Specimen Description URINE, RANDOM   Final   Special Requests ADDED [phone]   Final   Culture  Setup Time     Final   Value: 05/10/2013 06:00     Performed at [facility] Count     Final   Value: NO GROWTH     Performed at [facility]   Culture     Final   Value: NO GROWTH     Performed at [facility]   Report Status 05/11/2013 FINAL   Final  MRSA PCR SCREENING     Status: None   Collection Time    05/10/13  6:34 AM      Result Value Range Status   MRSA by PCR NEGATIVE  NEGATIVE Final   Comment:            The GeneXpert MRSA Assay (FDA     approved for NASAL specimens     only), is one component of a     comprehensive MRSA colonization     surveillance program. It is not     intended to diagnose MRSA     infection nor to guide or     monitor treatment for     MRSA infections.    Studies/Results: Dg Chest 2 View  05/10/2013   *RADIOLOGY REPORT*  Clinical Data: Loss of consciousness and fall.  Temperature for 2 days.  CHEST - 2 VIEW  Comparison: 08/28/2012  Findings: There is increased density in the lung bases particularly on the right with air bronchograms.  Changes are consistent with pneumonia although atelectasis or consolidation can also have this appearance.  There is progression since the previous study.  Mild cardiac enlargement with normal pulmonary vascularity.  No blunting of costophrenic angles.  No pneumothorax.  Mediastinal contours appear intact.   IMPRESSION: Focal consolidation in the right lung base suggest pneumonia. Atelectasis or infiltration also demonstrated in the left lung base.  Original Report Authenticated By: Burman Nieves, M.D.   Ct Head Wo Contrast  05/10/2013   *RADIOLOGY REPORT*  Clinical Data: Temperature for 2 days.  Fall earlier today, striking nose on the carpet.  Headache.  Cough with white sputum.  CT HEAD WITHOUT CONTRAST  Technique:  Contiguous axial images were obtained from the base of the skull through the vertex without contrast.  Comparison: 03/28/2010  Findings: The ventricles and sulci are symmetrical without significant effacement, displacement, or dilatation. No mass effect or midline shift. No abnormal extra-axial fluid collections. The grey-white matter junction is distinct. Basal cisterns are not effaced. No acute intracranial hemorrhage. No depressed skull fractures.  Visualized paranasal sinuses and mastoid air cells are not opacified.  Vascular calcifications.  No significant changes since the previous study.  IMPRESSION: No acute intracranial abnormalities.   Original Report Authenticated By: Burman Nieves, M.D.   US Renal  05/12/2013   *RADIOLOGY REPORT*  Clinical Data:  Acute kidney injury  RENAL/URINARY TRACT ULTRASOUND COMPLETE  Comparison:  Abdominal MRA 04/30/2010; CT scan of the abdomen and pelvis 01/01/2010  Findings:  Right Kidney:  Normal in size at 12 cm.  Echogenic renal parenchyma.  No focal solid lesion identified.  No evidence of mass or hydronephrosis.  Left Kidney:  Normal in size at 11.1 cm.  Echogenic renal parenchyma.  No focal solid lesion identified. No evidence of mass or hydronephrosis.  Bladder:  Appears normal for degree of bladder distention. Prostate gland is mildly enlarged at 3.7 x 2.7 x 4.5 cm.  IMPRESSION:  1.  Negative for hydronephrosis. 2.  Echogenic renal parenchyma bilaterally suggests underlying medical renal disease. 3.  Borderline prostatomegaly.   Original Report  Authenticated By: Malachy Moan, M.D.    Medications: Scheduled Meds: . aspirin  81 mg Oral Daily  . azithromycin  500 mg Oral QHS  . carvedilol  6.25 mg Oral BID WC  . cefTRIAXone (ROCEPHIN)  IV  1 g Intravenous QHS  . enoxaparin (LOVENOX) injection  30 mg Subcutaneous Daily  . isosorbide mononitrate  30 mg Oral Daily  . ranolazine  500 mg Oral BID  . simvastatin  20 mg Oral q1800  . sodium chloride  3 mL Intravenous Q12H      LOS: 4 days   RAI,RIPUDEEP M.D. [facility] 05/13/2013, 1:49 PM Pager: [phone]  If 7PM-7AM, please contact night-coverage [email or web address] Password [email or web address]

## 2013-05-13 NOTE — Telephone Encounter (Signed)
Called patient and [facility] regarding medications refill request. Refill requests pending okay by Dr. Antoine Poche.

## 2013-05-13 NOTE — Telephone Encounter (Signed)
Patient called into refill line to request refills on: Hydralazine, Spironolactone and Amlodipine. Discharge Summary pending from most recent hospitalization on 05/09/13. Routed to Dr. Ramond Marrow, RN, for advisement.

## 2013-05-14 ENCOUNTER — Encounter (HOSPITAL_COMMUNITY): Payer: Self-pay | Admitting: General Practice

## 2013-05-14 LAB — LEGIONELLA ANTIGEN, URINE: Legionella Antigen, Urine: NEGATIVE

## 2013-05-14 LAB — BASIC METABOLIC PANEL
BUN: 39 mg/dL — ABNORMAL HIGH (ref 6–23)
Calcium: 8.6 mg/dL (ref 8.4–10.5)
Creatinine, Ser: 2.7 mg/dL — ABNORMAL HIGH (ref 0.50–1.35)
GFR calc Af Amer: 31 mL/min — ABNORMAL LOW (ref >90)
GFR calc non Af Amer: 27 mL/min — ABNORMAL LOW (ref >90)
Potassium: 4.4 mEq/L (ref 3.5–5.1)

## 2013-05-14 LAB — STREP PNEUMONIAE URINARY ANTIGEN: Strep Pneumo Urinary Antigen: NEGATIVE

## 2013-05-14 MED ORDER — HYDRALAZINE HCL 10 MG PO TABS
10.0000 mg | ORAL_TABLET | Freq: Three times a day (TID) | ORAL | Status: DC
  Administered 2013-05-14 – 2013-05-15 (×4): 10 mg via ORAL
  Filled 2013-05-14 (×8): qty 1

## 2013-05-14 MED ORDER — ENOXAPARIN SODIUM 60 MG/0.6ML ~~LOC~~ SOLN
50.0000 mg | SUBCUTANEOUS | Status: DC
  Administered 2013-05-15: 50 mg via SUBCUTANEOUS
  Filled 2013-05-14: qty 0.6

## 2013-05-14 NOTE — Telephone Encounter (Signed)
I have to see what medicines he was discharged on before restarting any.

## 2013-05-14 NOTE — Progress Notes (Signed)
Patient ID: [name]  male  ZOX:[id number]    DOB: 01/03/1966    DOA: 05/09/2013  PCP: Josue Hector, MD  Assessment/Plan: Principal Problem:   CAP (community acquired pneumonia), right lower lung-improving - Continue Zithromax, Rocephin, nebulizer treatment, O2  Active Problems:   CAD with elevated troponin, known coronary disease with occluded RCA - Cardiology following, continue aspirin, statin, beta blocker    Acute on chronic kidney disease, stage 3 - Creatinine trending down now, baseline 2-3. Renal ultrasound negative for any hydronephrosis. - Lasix currently on hold due to worsening renal insufficiency. Creatinine function trending down to his baseline. Will DC the IV fluids. - Started on hydralazine today , continue Coreg and Imdur -Defer to cardiology if patient can be started on oral Lasix 40mg  daily at this time (outpatient he is on Aldactone 12.5 mg daily with Lasix 40 mg BID)    SVT (supraventricular tachycardia)- resolved with adenosine.   DVT Prophylaxis:  Code Status:  Disposition:Hopefully tomorrow if it's okay with cardiology. ? Lasix  Also get PT evaluation, out of bed to chair, have never seen patient up and about.   Subjective: Denies any specific complaints, no chest pain. Resting in the bed   Objective: Weight change: -1.7 kg (-3 lb 12 oz)  Intake/Output Summary (Last 24 hours) at 05/14/13 1240 Last data filed at 05/14/13 0900  Gross per 24 hour  Intake    640 ml  Output    800 ml  Net   -160 ml   Blood pressure 126/92, pulse 74, temperature 98.1 F (36.7 C), temperature source Oral, resp. rate 16, height 5\' 10"  (1.778 m), weight 102.7 kg (226 lb 6.6 oz), SpO2 95.00%.  Physical Exam: General: oriented x3, not in any acute distress. CVS: S1-S2 clear, no murmur rubs or gallops Chest: clear to auscultation bilaterally, no wheezing, rales or rhonchi Abdomen: soft nontender, nondistended, normal bowel sounds  Extremities: no cyanosis,  clubbing. Trace edema   Lab Results: Basic Metabolic Panel:  Recent Labs Lab 05/13/13 0427 05/14/13 0548  NA 130* 134*  K 4.1 4.4  CL 95* 98  CO2 24 25  GLUCOSE 131* 128*  BUN 49* 39*  CREATININE 3.59* 2.70*  CALCIUM 8.1* 8.6   Liver Function Tests:  Recent Labs Lab 05/10/13 0045  AST 43*  ALT 18  ALKPHOS 57  BILITOT 1.1  PROT 7.3  ALBUMIN 2.7*   No results found for this basename: LIPASE, AMYLASE,  in the last 168 hours No results found for this basename: AMMONIA,  in the last 168 hours CBC:  Recent Labs Lab 05/11/13 0530 05/13/13 0427  WBC 10.0 8.3  HGB 11.5* 11.5*  HCT 33.6* 33.7*  MCV 82.8 83.0  PLT 174 208   Cardiac Enzymes:  Recent Labs Lab 05/10/13 0945 05/10/13 1615 05/10/13 2240  TROPONINI 0.51* 0.40* 0.46*   BNP: No components found with this basename: POCBNP,  CBG:  Recent Labs Lab 05/10/13 0255 05/11/13 0747 05/11/13 1610  GLUCAP 191* 130* 137*     Micro Results: Recent Results (from the past 240 hour(s))  CULTURE, BLOOD (ROUTINE X 2)     Status: None   Collection Time    05/10/13 12:35 AM      Result Value Range Status   Specimen Description BLOOD RIGHT ARM   Final   Special Requests BOTTLES DRAWN AEROBIC AND ANAEROBIC 10CC EA   Final   Culture  Setup Time     Final   Value: 05/10/2013 06:20  Performed at [facility]     Final   Value:        BLOOD CULTURE RECEIVED NO GROWTH TO DATE CULTURE WILL BE HELD FOR 5 DAYS BEFORE ISSUING A FINAL NEGATIVE REPORT     Performed at [facility]   Report Status PENDING   Incomplete  CULTURE, BLOOD (ROUTINE X 2)     Status: None   Collection Time    05/10/13 12:45 AM      Result Value Range Status   Specimen Description BLOOD RIGHT ARM   Final   Special Requests BOTTLES DRAWN AEROBIC AND ANAEROBIC 10CC EA   Final   Culture  Setup Time     Final   Value: 05/10/2013 06:20     Performed at [facility]   Culture     Final   Value:        BLOOD  CULTURE RECEIVED NO GROWTH TO DATE CULTURE WILL BE HELD FOR 5 DAYS BEFORE ISSUING A FINAL NEGATIVE REPORT     Performed at [facility]   Report Status PENDING   Incomplete  URINE CULTURE     Status: None   Collection Time    05/10/13  4:04 AM      Result Value Range Status   Specimen Description URINE, RANDOM   Final   Special Requests ADDED [phone]   Final   Culture  Setup Time     Final   Value: 05/10/2013 06:00     Performed at [facility] Count     Final   Value: NO GROWTH     Performed at [facility]   Culture     Final   Value: NO GROWTH     Performed at [facility]   Report Status 05/11/2013 FINAL   Final  MRSA PCR SCREENING     Status: None   Collection Time    05/10/13  6:34 AM      Result Value Range Status   MRSA by PCR NEGATIVE  NEGATIVE Final   Comment:            The GeneXpert MRSA Assay (FDA     approved for NASAL specimens     only), is one component of a     comprehensive MRSA colonization     surveillance program. It is not     intended to diagnose MRSA     infection nor to guide or     monitor treatment for     MRSA infections.    Studies/Results: Dg Chest 2 View  05/10/2013   *RADIOLOGY REPORT*  Clinical Data: Loss of consciousness and fall.  Temperature for 2 days.  CHEST - 2 VIEW  Comparison: 08/28/2012  Findings: There is increased density in the lung bases particularly on the right with air bronchograms.  Changes are consistent with pneumonia although atelectasis or consolidation can also have this appearance.  There is progression since the previous study.  Mild cardiac enlargement with normal pulmonary vascularity.  No blunting of costophrenic angles.  No pneumothorax.  Mediastinal contours appear intact.  IMPRESSION: Focal consolidation in the right lung base suggest pneumonia. Atelectasis or infiltration also demonstrated in the left lung base.   Original Report Authenticated By: Burman Nieves, M.D.   Ct  Head Wo Contrast  05/10/2013   *RADIOLOGY REPORT*  Clinical Data: Temperature for 2 days.  Fall earlier today, striking nose on the carpet.  Headache.  Cough with white sputum.  CT HEAD WITHOUT CONTRAST  Technique:  Contiguous axial images were obtained from the base of the skull through the vertex without contrast.  Comparison: 03/28/2010  Findings: The ventricles and sulci are symmetrical without significant effacement, displacement, or dilatation. No mass effect or midline shift. No abnormal extra-axial fluid collections. The grey-white matter junction is distinct. Basal cisterns are not effaced. No acute intracranial hemorrhage. No depressed skull fractures.  Visualized paranasal sinuses and mastoid air cells are not opacified.  Vascular calcifications.  No significant changes since the previous study.  IMPRESSION: No acute intracranial abnormalities.   Original Report Authenticated By: Burman Nieves, M.D.   US Renal  05/12/2013   *RADIOLOGY REPORT*  Clinical Data:  Acute kidney injury  RENAL/URINARY TRACT ULTRASOUND COMPLETE  Comparison:  Abdominal MRA 04/30/2010; CT scan of the abdomen and pelvis 01/01/2010  Findings:  Right Kidney:  Normal in size at 12 cm.  Echogenic renal parenchyma.  No focal solid lesion identified.  No evidence of mass or hydronephrosis.  Left Kidney:  Normal in size at 11.1 cm.  Echogenic renal parenchyma.  No focal solid lesion identified. No evidence of mass or hydronephrosis.  Bladder:  Appears normal for degree of bladder distention. Prostate gland is mildly enlarged at 3.7 x 2.7 x 4.5 cm.  IMPRESSION:  1.  Negative for hydronephrosis. 2.  Echogenic renal parenchyma bilaterally suggests underlying medical renal disease. 3.  Borderline prostatomegaly.   Original Report Authenticated By: Malachy Moan, M.D.    Medications: Scheduled Meds: . aspirin  81 mg Oral Daily  . azithromycin  500 mg Oral QHS  . carvedilol  6.25 mg Oral BID WC  . cefTRIAXone (ROCEPHIN)  IV  1 g  Intravenous QHS  . enoxaparin (LOVENOX) injection  30 mg Subcutaneous Daily  . hydrALAZINE  10 mg Oral Q8H  . isosorbide mononitrate  30 mg Oral Daily  . ranolazine  500 mg Oral BID  . simvastatin  20 mg Oral q1800  . sodium chloride  3 mL Intravenous Q12H      LOS: 5 days   RAI,RIPUDEEP M.D. [facility] 05/14/2013, 12:40 PM Pager: [phone]  If 7PM-7AM, please contact night-coverage [email or web address] Password [email or web address]

## 2013-05-14 NOTE — Progress Notes (Signed)
Patient ID: [name], male   DOB: Mar 24, 1966, 47 y.o.   MRN: [id number]    SUBJECTIVE: Pt is a 47 yo with hx of CAD, ischemic CM with EF 15% admitted with cough and wheezing on 9/7.  Tmax 2 nights ago was 102.6.  Renal function has been improving.    Marland Kitchen aspirin  81 mg Oral Daily  . azithromycin  500 mg Oral QHS  . carvedilol  6.25 mg Oral BID WC  . cefTRIAXone (ROCEPHIN)  IV  1 g Intravenous QHS  . enoxaparin (LOVENOX) injection  30 mg Subcutaneous Daily  . isosorbide mononitrate  30 mg Oral Daily  . ranolazine  500 mg Oral BID  . simvastatin  20 mg Oral q1800  . sodium chloride  3 mL Intravenous Q12H      Filed Vitals:   05/13/13 1100 05/13/13 1318 05/13/13 2019 05/14/13 0510  BP: 117/69 131/90 145/90 126/92  Pulse: 66 62 73 74  Temp: 98.9 F (37.2 C) 97.8 F (36.6 C) 99.1 F (37.3 C) 98.1 F (36.7 C)  TempSrc: Oral Oral Oral Oral  Resp: 18 18 24 16   Height:      Weight:    226 lb 6.6 oz (102.7 kg)  SpO2: 100% 97% 96% 95%    Intake/Output Summary (Last 24 hours) at 05/14/13 1025 Last data filed at 05/14/13 0500  Gross per 24 hour  Intake    640 ml  Output    600 ml  Net     40 ml    LABS: Basic Metabolic Panel:  Recent Labs  11/91/47 0427 05/14/13 0548  NA 130* 134*  K 4.1 4.4  CL 95* 98  CO2 24 25  GLUCOSE 131* 128*  BUN 49* 39*  CREATININE 3.59* 2.70*  CALCIUM 8.1* 8.6   Liver Function Tests: No results found for this basename: AST, ALT, ALKPHOS, BILITOT, PROT, ALBUMIN,  in the last 72 hours No results found for this basename: LIPASE, AMYLASE,  in the last 72 hours CBC:  Recent Labs  05/13/13 0427  WBC 8.3  HGB 11.5*  HCT 33.7*  MCV 83.0  PLT 208   Cardiac Enzymes: No results found for this basename: CKTOTAL, CKMB, CKMBINDEX, TROPONINI,  in the last 72 hours BNP: No components found with this basename: POCBNP,  D-Dimer: No results found for this basename: DDIMER,  in the last 72 hours Hemoglobin A1C: No results found for this  basename: HGBA1C,  in the last 72 hours Fasting Lipid Panel: No results found for this basename: CHOL, HDL, LDLCALC, TRIG, CHOLHDL, LDLDIRECT,  in the last 72 hours Thyroid Function Tests: No results found for this basename: TSH, T4TOTAL, FREET3, T3FREE, THYROIDAB,  in the last 72 hours Anemia Panel: No results found for this basename: VITAMINB12, FOLATE, FERRITIN, TIBC, IRON, RETICCTPCT,  in the last 72 hours  RADIOLOGY: Dg Chest 2 View  05/10/2013   *RADIOLOGY REPORT*  Clinical Data: Loss of consciousness and fall.  Temperature for 2 days.  CHEST - 2 VIEW  Comparison: 08/28/2012  Findings: There is increased density in the lung bases particularly on the right with air bronchograms.  Changes are consistent with pneumonia although atelectasis or consolidation can also have this appearance.  There is progression since the previous study.  Mild cardiac enlargement with normal pulmonary vascularity.  No blunting of costophrenic angles.  No pneumothorax.  Mediastinal contours appear intact.  IMPRESSION: Focal consolidation in the right lung base suggest pneumonia. Atelectasis or infiltration also demonstrated in the  left lung base.   Original Report Authenticated By: Burman Nieves, M.D.   Ct Head Wo Contrast  05/10/2013   *RADIOLOGY REPORT*  Clinical Data: Temperature for 2 days.  Fall earlier today, striking nose on the carpet.  Headache.  Cough with white sputum.  CT HEAD WITHOUT CONTRAST  Technique:  Contiguous axial images were obtained from the base of the skull through the vertex without contrast.  Comparison: 03/28/2010  Findings: The ventricles and sulci are symmetrical without significant effacement, displacement, or dilatation. No mass effect or midline shift. No abnormal extra-axial fluid collections. The grey-white matter junction is distinct. Basal cisterns are not effaced. No acute intracranial hemorrhage. No depressed skull fractures.  Visualized paranasal sinuses and mastoid air cells are  not opacified.  Vascular calcifications.  No significant changes since the previous study.  IMPRESSION: No acute intracranial abnormalities.   Original Report Authenticated By: Burman Nieves, M.D.    PHYSICAL EXAM General: NAD Neck: Thick,  no thyromegaly or thyroid nodule.  Lungs: basically clear CV: Nondisplaced PMI.  Heart regular S1/S2, no S3/S4, no murmur.  No peripheral edema.  No carotid bruit.   Abdomen: Soft, nontender, no hepatosplenomegaly, no distention.  Neurologic: Alert and oriented x 3.  Psych: Normal affect. Extremities: No clubbing or cyanosis.   TELEMETRY: Reviewed telemetry pt in NSR  ASSESSMENT AND PLAN: 47 yo with history of CKD, HTN, CAD, and ischemic cardiomyopathy presented with PNA.  Cardiology consulted because of elevated troponin.   1. PNA: RLL PNA by exam, CXR.  Tmax 99.1 overnight.  Continue course of antibiotics per medicine team.    2. Elevated troponin: No chest pain except when he coughs.  TnI mildly elevated at 0.43 initially.  Known CAD with occluded RCA.    It is possible that the elevated Troponin level is related to his acute renal insufficiency.  No further work up for this at this time.   3. Chronic systolic CHF: his EF is 15% by echo.   He will need close follow up  He is back on coreg and Imdur.   Will add hydralazine 10 TID.   spirinolactone therapy may be difficult given his chronic renal insufficiency.   4. CKD: Creatinine is trending down.    [name]. 05/14/2013 10:25 AM

## 2013-05-15 MED ORDER — HYDRALAZINE HCL 10 MG PO TABS: 10.0000 mg | ORAL_TABLET | Freq: Three times a day (TID) | ORAL | Status: DC

## 2013-05-15 MED ORDER — FUROSEMIDE 40 MG PO TABS: 40.0000 mg | ORAL_TABLET | Freq: Every day | ORAL | Status: DC

## 2013-05-15 MED ORDER — CARVEDILOL 6.25 MG PO TABS: 6.2500 mg | ORAL_TABLET | Freq: Two times a day (BID) | ORAL | Status: DC

## 2013-05-15 MED ORDER — ISOSORBIDE MONONITRATE ER 30 MG PO TB24: 30.0000 mg | ORAL_TABLET | Freq: Every day | ORAL | Status: DC

## 2013-05-15 MED ORDER — CEFUROXIME AXETIL 500 MG PO TABS: 500.0000 mg | ORAL_TABLET | Freq: Two times a day (BID) | ORAL | Status: DC

## 2013-05-15 MED ORDER — AZITHROMYCIN 500 MG PO TABS: 500.0000 mg | ORAL_TABLET | Freq: Every day | ORAL | Status: DC

## 2013-05-15 NOTE — Evaluation (Signed)
Physical Therapy Evaluation Patient Details Name: [name] MRN: [id number] DOB: 1966/07/11 Today's Date: 05/15/2013 Time: 4696-2952 PT Time Calculation (min): 16 min  PT Assessment / Plan / Recommendation History of Present Illness  [name] is a 47 yo male with CAP  Clinical Impression  Patient demonstrates independence at this time. SpO2 monitored and >95% on rm air throughout activity. No acute PT needs. Pt will sign off.     PT Assessment  Patent does not need any further PT services    Follow Up Recommendations  No PT follow up          Equipment Recommendations  None recommended by PT          Precautions / Restrictions Precautions Precautions: None   Pertinent Vitals/Pain No pain, SpO2 95% on rm air with activity      Mobility  Bed Mobility Bed Mobility: Supine to Sit;Sitting - Scoot to Edge of Bed Supine to Sit: 7: Independent Sitting - Scoot to [place] of Bed: 7: Independent Transfers Transfers: Sit to Stand;Stand to Sit Sit to Stand: 7: Independent Stand to Sit: 7: Independent Ambulation/Gait Ambulation/Gait Assistance: 7: Independent Ambulation Distance (Feet): 400 Feet Assistive device: None Ambulation/Gait Assistance Details: no assist needed Gait Pattern: Within Functional Limits Gait velocity: WFL for community ambulation General Gait Details: good stability       PT Goals(Current goals can be found in the care plan section) Acute Rehab PT Goals Patient Stated Goal: to go home  Visit Information  Last PT Received On: 05/15/13 Assistance Needed: +1 History of Present Illness: CAP       Prior Functioning  Home Living Family/patient expects to be discharged to:: Private residence Living Arrangements: Alone Available Help at Discharge: Friend(s) Type of Home: Apartment Home Access: Level entry Home Layout: One level Home Equipment: None Prior Function Level of Independence: Independent Communication Communication: No  difficulties Dominant Hand: Right    Cognition  Cognition Arousal/Alertness: Awake/alert Behavior During Therapy: WFL for tasks assessed/performed Overall Cognitive Status: Within Functional Limits for tasks assessed    Extremity/Trunk Assessment Upper Extremity Assessment Upper Extremity Assessment: Overall WFL for tasks assessed Lower Extremity Assessment Lower Extremity Assessment: Overall WFL for tasks assessed   Balance Balance Balance Assessed: Yes High Level Balance High Level Balance Activites: Side stepping;Backward walking;Direction changes;Turns;Head turns High Level Balance Comments: WFL no deficits noted  End of Session PT - End of Session Equipment Utilized During Treatment: Gait belt Activity Tolerance: Patient tolerated treatment well  GP     [name] 05/15/2013, 9:08 AM  [name], PT DPT  [phone]

## 2013-05-15 NOTE — Care Management Note (Signed)
    Page 1 of 1   05/15/2013     2:46:52 PM   CARE MANAGEMENT NOTE 05/15/2013  Patient:  [name],[name]   Account Number:  [id number]  Date Initiated:  05/10/2013  Documentation initiated by:  [name]  Subjective/Objective Assessment:   47 y/o m admitted w/fall,& pna.     Action/Plan:   From home.   Anticipated DC Date:  05/15/2013   Anticipated DC Plan:  HOME/SELF CARE      DC Planning Services  CM consult      Choice offered to / List presented to:             Status of service:  Completed, signed off Medicare Important Message given?   (If response is "NO", the following Medicare IM given date fields will be blank) Date Medicare IM given:   Date Additional Medicare IM given:    Discharge Disposition:  HOME/SELF CARE  Per UR Regulation:  Reviewed for med. necessity/level of care/duration of stay  If discussed at Long Length of Stay Meetings, dates discussed:    Comments:  05/15/13 [name] Ogden,RN,BSN [phone] PT FOR DC HOME TODAY WITH WIFE.  P.T. EVALUATION COMPLETED; NO FOLLOW UP RECOMMENDED.  NO DC NEEDS IDENTIFIED.

## 2013-05-15 NOTE — Discharge Summary (Addendum)
Physician Discharge Summary  Patient ID: [name] MRN: [id number] DOB/AGE: [birth year] 46 y.o.  Admit date: 05/09/2013 Discharge date: 05/15/2013  Primary Care Physician:  Josue Hector, MD  Discharge Diagnoses:   Present on Admission:  . Syncope . CAP (community acquired pneumonia) . CAD . (Resolved) Pneumonia, organism unspecified . (Resolved) Elevated troponin . Acute on chronic kidney disease, stage 3  Consults:  Cardiology   Recommendations for Outpatient Follow-up:  Please note that several of his medications were changed inpatient I resumed Lasix 40 mg daily at discharge, please follow BMET  Allergies:  No Known Allergies   Discharge Medications:   Medication List    STOP taking these medications       amLODipine 10 MG tablet  Commonly known as:  NORVASC     spironolactone 25 MG tablet  Commonly known as:  ALDACTONE      TAKE these medications       aspirin EC 81 MG tablet  Take 81 mg by mouth daily.     azithromycin 500 MG tablet  Commonly known as:  ZITHROMAX  Take 1 tablet (500 mg total) by mouth daily. For another 4days     carvedilol 6.25 MG tablet  Commonly known as:  COREG  Take 1 tablet (6.25 mg total) by mouth 2 (two) times daily with a meal.     cefUROXime 500 MG tablet  Commonly known as:  CEFTIN  Take 1 tablet (500 mg total) by mouth 2 (two) times daily. X 4 days     cholecalciferol 1000 UNITS tablet  Commonly known as:  VITAMIN D  Take 1,000 Units by mouth daily.     furosemide 40 MG tablet  Commonly known as:  LASIX  Take 1 tablet (40 mg total) by mouth daily.     hydrALAZINE 10 MG tablet  Commonly known as:  APRESOLINE  Take 1 tablet (10 mg total) by mouth every 8 (eight) hours.     isosorbide mononitrate 30 MG 24 hr tablet  Commonly known as:  IMDUR  Take 1 tablet (30 mg total) by mouth daily.     nitroGLYCERIN 0.4 MG SL tablet  Commonly known as:  NITROSTAT  Place 0.4 mg under the tongue every 5 (five)  minutes as needed for chest pain.     pravastatin 40 MG tablet  Commonly known as:  PRAVACHOL  Take 40 mg by mouth at bedtime.     ranolazine 500 MG 12 hr tablet  Commonly known as:  RANEXA  Take 1 tablet (500 mg total) by mouth 2 (two) times daily.         Brief H and P: For complete details please refer to admission H and P, but in brief patient is a 47 year old male with history of coronary disease, ICM EF 25%, hypertension, CAD presented with several days of cough, wheezing which will go to worsened on the day of admission with some fever and shortness of breath. Patient denied any recent antibiotics. He mostly had chest pain associated with the cough. He got up to go to the bathroom and passed out briefly in the hallway. Patient was admitted for further workup.  Hospital Course: The patient is a 47 year old male with severely depressed LV systolic function with EF of 25%, coronary artery disease with known occluded RCA with collaterals was admitted with fever, chills and syncopal episode. In the ED patient was initially called as Code STEMI but was seen by cardiology and felt that ECG showed old anterior  MI Q waves and patient was found to have right lower lobe pneumonia. Troponin was elevated. In the ED patient also had an episode of SVT with rate of 150 beats per minute which was terminated with 6 mg of IV adenosine.  CAP (community acquired pneumonia), right lower lung-improving. Patient is to continue on Zithromax and Ceftin for another 4 days to complete the treatment. Please obtain chest x-ray in 2-3 weeks to ensure complete resolution of pneumonia.  CAD with elevated troponin, known coronary disease with occluded RCA and collaterals. Cardiology followed the patient throughout the hospitalization. Patient was continued on aspirin, statin, beta blocker. However all his cardiac medications were adjusted as patient was somewhat hypotensive due to acute respiratory illness. 2-D echo  showed EF of 15% with diffuse hypokinesis.  Acute on chronic kidney disease, stage 3 : Creatinine has improved, baseline 2-3 Renal ultrasound negative for any hydronephrosis. Lasix was placed on hold due to worsening renal insufficiency. Creatinine function trended down to his baseline. Initially patient had received IV Lasix which caused worsening of his renal insufficiency, creatinine plateaued at 4.55. Lasix was discontinued and patient was placed on IV fluids. Creatinine function has now improved to his baseline. He is on Aldactone per 0.5 mg daily with Lasix 40 mg BID outpatient. I also called [facility] cardiology office for earlier appointment for the patient.   SVT (supraventricular tachycardia)- resolved with adenosine.    Day of Discharge BP 141/95  Pulse 80  Temp(Src) 98.6 F (37 C) (Oral)  Resp 18  Ht 5\' 10"  (1.778 m)  Wt 102.7 kg (226 lb 6.6 oz)  BMI 32.49 kg/m2  SpO2 99%  Physical Exam: General: Alert and awake oriented x3 not in any acute distress. CVS: S1-S2 clear no murmur rubs or gallops Chest: clear to auscultation bilaterally, no wheezing rales or rhonchi Abdomen: soft nontender, nondistended, normal bowel sounds Extremities: no cyanosis, clubbing or edema noted bilaterally    The results of significant diagnostics from this hospitalization (including imaging, microbiology, ancillary and laboratory) are listed below for reference.    LAB RESULTS: Basic Metabolic Panel:  Recent Labs Lab 05/13/13 0427 05/14/13 0548  NA 130* 134*  K 4.1 4.4  CL 95* 98  CO2 24 25  GLUCOSE 131* 128*  BUN 49* 39*  CREATININE 3.59* 2.70*  CALCIUM 8.1* 8.6   Liver Function Tests:  Recent Labs Lab 05/10/13 0045  AST 43*  ALT 18  ALKPHOS 57  BILITOT 1.1  PROT 7.3  ALBUMIN 2.7*   No results found for this basename: LIPASE, AMYLASE,  in the last 168 hours No results found for this basename: AMMONIA,  in the last 168 hours CBC:  Recent Labs Lab 05/11/13 0530  05/13/13 0427  WBC 10.0 8.3  HGB 11.5* 11.5*  HCT 33.6* 33.7*  MCV 82.8 83.0  PLT 174 208   Cardiac Enzymes:  Recent Labs Lab 05/10/13 1615 05/10/13 2240  TROPONINI 0.40* 0.46*   BNP: No components found with this basename: POCBNP,  CBG:  Recent Labs Lab 05/11/13 0747 05/11/13 1610  GLUCAP 130* 137*    Significant Diagnostic Studies:  Dg Chest 2 View  05/10/2013   *RADIOLOGY REPORT*  Clinical Data: Loss of consciousness and fall.  Temperature for 2 days.  CHEST - 2 VIEW  Comparison: 08/28/2012  Findings: There is increased density in the lung bases particularly on the right with air bronchograms.  Changes are consistent with pneumonia although atelectasis or consolidation can also have this appearance.  There is  progression since the previous study.  Mild cardiac enlargement with normal pulmonary vascularity.  No blunting of costophrenic angles.  No pneumothorax.  Mediastinal contours appear intact.  IMPRESSION: Focal consolidation in the right lung base suggest pneumonia. Atelectasis or infiltration also demonstrated in the left lung base.   Original Report Authenticated By: Burman Nieves, M.D.   Ct Head Wo Contrast  05/10/2013   *RADIOLOGY REPORT*  Clinical Data: Temperature for 2 days.  Fall earlier today, striking nose on the carpet.  Headache.  Cough with white sputum.  CT HEAD WITHOUT CONTRAST  Technique:  Contiguous axial images were obtained from the base of the skull through the vertex without contrast.  Comparison: 03/28/2010  Findings: The ventricles and sulci are symmetrical without significant effacement, displacement, or dilatation. No mass effect or midline shift. No abnormal extra-axial fluid collections. The grey-white matter junction is distinct. Basal cisterns are not effaced. No acute intracranial hemorrhage. No depressed skull fractures.  Visualized paranasal sinuses and mastoid air cells are not opacified.  Vascular calcifications.  No significant changes since  the previous study.  IMPRESSION: No acute intracranial abnormalities.   Original Report Authenticated By: Burman Nieves, M.D.    2D ECHO: Study Conclusions  - Left ventricle: The cavity size was mildly dilated. Wall thickness was increased in a pattern of mild LVH. The estimated ejection fraction was 15%. Diffuse hypokinesis. Features are consistent with a pseudonormal left ventricular filling pattern, with concomitant abnormal relaxation and increased filling pressure (grade 2 diastolic dysfunction). - Aortic valve: Trivial regurgitation. - Left atrium: The atrium was moderately dilated. - Right ventricle: Systolic function was mildly reduced. - Pericardium, extracardiac: A trivial pericardial effusion was identified.    Disposition and Follow-up:     Discharge Orders   Future Orders Complete By Expires   (HEART FAILURE PATIENTS) Call MD:  Anytime you have any of the following symptoms: 1) 3 pound weight gain in 24 hours or 5 pounds in 1 week 2) shortness of breath, with or without a dry hacking cough 3) swelling in the hands, feet or stomach 4) if you have to sleep on extra pillows at night in order to breathe.  As directed    Diet - low sodium heart healthy  As directed    Discharge instructions  As directed    Comments:     Please review all the medication changes.   Increase activity slowly  As directed        DISPOSITION: Home  DIET: Heart healthy diet  ACTIVITY: As tolerated  TESTS THAT NEED FOLLOW-UP BMET next week  DISCHARGE FOLLOW-UP Follow-up Information   Follow up with Josue Hector, MD. Schedule an appointment as soon as possible for a visit in 1 week. (please have bmet (kidney function) checked )    Specialty:  Family Medicine   Contact information:   [address] [place] [address] [phone]       Follow up with Rollene Rotunda, MD. Schedule an appointment as soon as possible for a visit in 2 weeks. (for hospital follow-up)     Specialty:  Cardiology   Contact information:   [address] [place] [address] [phone]       Time spent on Discharge: 40 minutes  Signed:   Gracyn Santillanes M.D. [facility] 05/15/2013, 11:54 AM Pager: [phone]   Addendum: - admission vital signs on 05/09/2013 showed pulse of 109, temperature 102.6, BP 114/80 with leukocytosis, meets criteria for SIRS, likely due due  to pneumonia.    Raedyn Wenke M.D. [facility] 05/27/2013, 2:38 PM  Pager: [phone]

## 2013-05-16 LAB — CULTURE, BLOOD (ROUTINE X 2): Culture: NO GROWTH

## 2013-06-09 ENCOUNTER — Ambulatory Visit (HOSPITAL_COMMUNITY)
Admission: RE | Admit: 2013-06-09 | Discharge: 2013-06-09 | Disposition: A | Discharge status: Home / Self Care | Payer: Medicare Other | Source: Ambulatory Visit | Attending: Family Medicine | Admitting: Family Medicine

## 2013-06-09 ENCOUNTER — Other Ambulatory Visit (HOSPITAL_COMMUNITY): Payer: Self-pay | Admitting: Family Medicine

## 2013-06-09 DIAGNOSIS — I700 Atherosclerosis of aorta: Secondary | ICD-10-CM | POA: Insufficient documentation

## 2013-06-09 DIAGNOSIS — R0989 Other specified symptoms and signs involving the circulatory and respiratory systems: Secondary | ICD-10-CM | POA: Insufficient documentation

## 2013-06-09 DIAGNOSIS — I10 Essential (primary) hypertension: Secondary | ICD-10-CM | POA: Insufficient documentation

## 2013-06-09 DIAGNOSIS — I2589 Other forms of chronic ischemic heart disease: Secondary | ICD-10-CM | POA: Insufficient documentation

## 2013-06-09 DIAGNOSIS — J189 Pneumonia, unspecified organism: Secondary | ICD-10-CM | POA: Insufficient documentation

## 2013-06-09 DIAGNOSIS — R05 Cough: Secondary | ICD-10-CM

## 2013-06-09 DIAGNOSIS — I2510 Atherosclerotic heart disease of native coronary artery without angina pectoris: Secondary | ICD-10-CM | POA: Insufficient documentation

## 2013-06-10 ENCOUNTER — Ambulatory Visit (INDEPENDENT_AMBULATORY_CARE_PROVIDER_SITE_OTHER): Payer: Medicare Other | Admitting: Cardiology

## 2013-06-10 ENCOUNTER — Encounter: Payer: Self-pay | Admitting: Cardiology

## 2013-06-10 VITALS — BP 136/89 | HR 65 | Ht 70.0 in | Wt 233.0 lb

## 2013-06-10 DIAGNOSIS — I498 Other specified cardiac arrhythmias: Secondary | ICD-10-CM

## 2013-06-10 DIAGNOSIS — I471 Supraventricular tachycardia, unspecified: Secondary | ICD-10-CM

## 2013-06-10 DIAGNOSIS — I2510 Atherosclerotic heart disease of native coronary artery without angina pectoris: Secondary | ICD-10-CM

## 2013-06-10 DIAGNOSIS — I10 Essential (primary) hypertension: Secondary | ICD-10-CM

## 2013-06-10 DIAGNOSIS — I200 Unstable angina: Secondary | ICD-10-CM

## 2013-06-10 NOTE — Progress Notes (Signed)
HPI The patient presents for followup of his coronary disease and difficult to control hypertension. He has progressive renal insufficiency related to his hypertension.  Hs blood pressure is better controlled than ever over a prolonged stretch. He is having no symptoms. In particular his not having any chest discomfort or shortness of breath. He is walking for activity.  The patient denies any neck or arm discomfort. There has been no new, PND or orthopnea. There have been no reported palpitations, presyncope or syncope.  He has had no edema.  He is compliant with meds and lifestyle changes.   No Known Allergies  Current Outpatient Prescriptions  Medication Sig Dispense Refill  . aspirin EC 81 MG tablet Take 81 mg by mouth daily.      . carvedilol (COREG) 25 MG tablet Take 25 mg by mouth 2 (two) times daily with a meal.      . cholecalciferol (VITAMIN D) 1000 UNITS tablet Take 1,000 Units by mouth daily.      . furosemide (LASIX) 40 MG tablet Take 1 tablet (40 mg total) by mouth daily.  240 tablet  3  . isosorbide mononitrate (IMDUR) 30 MG 24 hr tablet Take 120 mg by mouth daily.      Marland Kitchen levofloxacin (LEVAQUIN) 500 MG tablet       . nitroGLYCERIN (NITROSTAT) 0.4 MG SL tablet Place 0.4 mg under the tongue every 5 (five) minutes as needed for chest pain.      . pravastatin (PRAVACHOL) 40 MG tablet Take 40 mg by mouth at bedtime.      Marland Kitchen PROAIR HFA 108 (90 BASE) MCG/ACT inhaler       . ranolazine (RANEXA) 500 MG 12 hr tablet Take 1 tablet (500 mg total) by mouth 2 (two) times daily.  60 tablet  1  . spironolactone (ALDACTONE) 25 MG tablet 25 mg. Take 1/2 daily      . azithromycin (ZITHROMAX) 500 MG tablet Take 1 tablet (500 mg total) by mouth daily. For another 4days  4 tablet  0   No current facility-administered medications for this visit.    Past Medical History  Diagnosis Date  . CAD (coronary artery disease) 12/2006    NSTEMI 2008 in the setting of profound epistaxis and anemia, last  cath 2012 03/2011 (med rx)  . Hypertension   . Hyperlipidemia   . Chronic kidney disease     Average creatine of about 2.3  . Ischemic cardiomyopathy     Prior EF 25%, apical, posterior akinesis, inferior akinesis, moderate LVH, mild MR, mild decreased RV function,  65% EF 2012 echo.  . Anemia   . Pneumonia 05/13/2013    Past Surgical History  Procedure Laterality Date  . Cardiac catheterization  2012    total RCA occlusion with left-to-right collaterals supplying the distal RCA branches, mild diffuse nonobstructive disease of LAD and LCx. Medically managed    ROS:  As stated in the HPI and negative for all other systems.  PHYSICAL EXAM BP 136/89  Pulse 65  Ht 5\' 10"  (1.778 m)  Wt 233 lb (105.688 kg)  BMI 33.43 kg/m2 GENERAL:  Well appearing NECK:  No jugular venous distention, waveform within normal limits, carotid upstroke brisk and symmetric, no bruits, no thyromegaly LUNGS:  Clear to auscultation bilaterally BACK:  No CVA tenderness CHEST:  Unremarkable HEART:  PMI not displaced or sustained,S1 and S2 within normal limits, no S3, no S4, no clicks, no rubs, no murmurs ABD:  Flat, positive bowel sounds  normal in frequency in pitch, no bruits, no rebound, no guarding, no midline pulsatile mass, no hepatomegaly, no splenomegaly EXT:  2 plus pulses throughout, no edema, no cyanosis no clubbing  ASSESSMENT AND PLAN  CAD -  Given the absence of symptoms no change in therapy is indicated. No further imaging is indicated.  CHRONIC SYSTOLIC HEART FAILURE -  He seems to be euvolemic. He will remain on the meds as listed.   CHRONIC KIDNEY DISEASE UNSPECIFIED - He is followed also by nephrology.  His creat last month was stable.   HYPERTENSION - We reviewed his meds as he had some extra bottles.  I threw out all of the incorrect meds.

## 2013-06-10 NOTE — Patient Instructions (Signed)
The current medical regimen is effective;  continue present plan and medications.  Follow up in 4 months with Dr Hochrein.  You will receive a letter in the mail 2 months before you are due.  Please call us when you receive this letter to schedule your follow up appointment.  

## 2013-06-16 ENCOUNTER — Other Ambulatory Visit: Payer: Self-pay

## 2013-06-16 MED ORDER — CARVEDILOL 25 MG PO TABS: 25.0000 mg | ORAL_TABLET | Freq: Two times a day (BID) | ORAL | Status: DC

## 2013-06-19 ENCOUNTER — Telehealth: Payer: Self-pay | Admitting: Cardiology

## 2013-06-19 NOTE — Telephone Encounter (Signed)
Dr Antoine Poche has paperwork

## 2013-06-19 NOTE — Telephone Encounter (Signed)
New Problem  Faxed over plan of care 9/28 with no response// Please complete an fax back// will resend fax

## 2013-06-26 ENCOUNTER — Telehealth: Payer: Self-pay | Admitting: *Deleted

## 2013-06-26 NOTE — Telephone Encounter (Signed)
Spoke to [employer] from [facility]. Advised her that I would route message to Dr. Antoine Poche and Avie Arenas, RN, to follow up.

## 2013-06-26 NOTE — Telephone Encounter (Signed)
Tiffany from [facility] called to inquire about status of previous requests on 8/28 and 10/17, for return of signed faxed orders for this patient received on 8/12 - 1) skilled nursing, 2) CAP assessment and 3) cardiopulmonary status order.

## 2013-06-29 NOTE — Telephone Encounter (Signed)
Dr Antoine Poche has paperwork however is not in the office this week.  Once it has been completed I will fax it back as instructed.

## 2013-06-30 ENCOUNTER — Other Ambulatory Visit: Payer: Self-pay | Admitting: *Deleted

## 2013-06-30 MED ORDER — RANOLAZINE ER 500 MG PO TB12: 500.0000 mg | ORAL_TABLET | Freq: Two times a day (BID) | ORAL | Status: DC

## 2013-07-03 ENCOUNTER — Telehealth: Payer: Self-pay | Admitting: Cardiology

## 2013-07-03 NOTE — Telephone Encounter (Signed)
New message    Have not received plan of care forms on pt.  They have been outstanding since aug---

## 2013-07-03 NOTE — Telephone Encounter (Signed)
LM w/ Fara Chute at [facility] care that Dr. Antoine Poche has been out of the office all week but will be in office Mon.  Forms should be faxed after he signs.

## 2013-07-07 ENCOUNTER — Other Ambulatory Visit: Payer: Self-pay

## 2013-07-07 MED ORDER — SPIRONOLACTONE 25 MG PO TABS: ORAL_TABLET | ORAL | Status: DC

## 2013-07-24 ENCOUNTER — Other Ambulatory Visit: Payer: Self-pay

## 2013-07-24 ENCOUNTER — Emergency Department (HOSPITAL_COMMUNITY): Payer: Medicare Other

## 2013-07-24 ENCOUNTER — Encounter (HOSPITAL_COMMUNITY): Payer: Self-pay | Admitting: Emergency Medicine

## 2013-07-24 ENCOUNTER — Inpatient Hospital Stay (HOSPITAL_COMMUNITY)
Admission: EM | Admit: 2013-07-24 | Discharge: 2013-07-27 | DRG: 291 | Disposition: A | Discharge status: Home / Self Care | Payer: Medicare Other | Attending: Cardiology | Admitting: Cardiology

## 2013-07-24 DIAGNOSIS — I130 Hypertensive heart and chronic kidney disease with heart failure and stage 1 through stage 4 chronic kidney disease, or unspecified chronic kidney disease: Secondary | ICD-10-CM | POA: Diagnosis present

## 2013-07-24 DIAGNOSIS — I160 Hypertensive urgency: Secondary | ICD-10-CM | POA: Diagnosis present

## 2013-07-24 DIAGNOSIS — I5043 Acute on chronic combined systolic (congestive) and diastolic (congestive) heart failure: Principal | ICD-10-CM

## 2013-07-24 DIAGNOSIS — Z87891 Personal history of nicotine dependence: Secondary | ICD-10-CM

## 2013-07-24 DIAGNOSIS — I509 Heart failure, unspecified: Secondary | ICD-10-CM

## 2013-07-24 DIAGNOSIS — Z7982 Long term (current) use of aspirin: Secondary | ICD-10-CM

## 2013-07-24 DIAGNOSIS — I5042 Chronic combined systolic (congestive) and diastolic (congestive) heart failure: Secondary | ICD-10-CM | POA: Diagnosis present

## 2013-07-24 DIAGNOSIS — I252 Old myocardial infarction: Secondary | ICD-10-CM

## 2013-07-24 DIAGNOSIS — R079 Chest pain, unspecified: Secondary | ICD-10-CM | POA: Diagnosis present

## 2013-07-24 DIAGNOSIS — N183 Chronic kidney disease, stage 3 unspecified: Secondary | ICD-10-CM | POA: Diagnosis present

## 2013-07-24 DIAGNOSIS — I2510 Atherosclerotic heart disease of native coronary artery without angina pectoris: Secondary | ICD-10-CM | POA: Diagnosis present

## 2013-07-24 DIAGNOSIS — J9600 Acute respiratory failure, unspecified whether with hypoxia or hypercapnia: Secondary | ICD-10-CM

## 2013-07-24 DIAGNOSIS — E785 Hyperlipidemia, unspecified: Secondary | ICD-10-CM | POA: Diagnosis present

## 2013-07-24 DIAGNOSIS — I428 Other cardiomyopathies: Secondary | ICD-10-CM | POA: Diagnosis present

## 2013-07-24 DIAGNOSIS — I119 Hypertensive heart disease without heart failure: Secondary | ICD-10-CM | POA: Diagnosis present

## 2013-07-24 HISTORY — DX: Supraventricular tachycardia, unspecified: I47.10

## 2013-07-24 HISTORY — DX: Epistaxis: R04.0

## 2013-07-24 HISTORY — DX: Cardiomyopathy, unspecified: I42.9

## 2013-07-24 HISTORY — DX: Supraventricular tachycardia: I47.1

## 2013-07-24 LAB — HEPATIC FUNCTION PANEL
ALT: 10 U/L (ref 0–53)
AST: 16 U/L (ref 0–37)
Alkaline Phosphatase: 54 U/L (ref 39–117)
Indirect Bilirubin: 1.3 mg/dL — ABNORMAL HIGH (ref 0.3–0.9)
Total Bilirubin: 1.6 mg/dL — ABNORMAL HIGH (ref 0.3–1.2)
Total Protein: 7.4 g/dL (ref 6.0–8.3)

## 2013-07-24 LAB — CBC WITH DIFFERENTIAL/PLATELET
Basophils Absolute: 0 10*3/uL (ref 0.0–0.1)
Basophils Relative: 1 % (ref 0–1)
HCT: 38.6 % — ABNORMAL LOW (ref 39.0–52.0)
Hemoglobin: 12.4 g/dL — ABNORMAL LOW (ref 13.0–17.0)
Lymphocytes Relative: 15 % (ref 12–46)
MCHC: 32.1 g/dL (ref 30.0–36.0)
Monocytes Relative: 6 % (ref 3–12)
Neutro Abs: 5.1 10*3/uL (ref 1.7–7.7)
Neutrophils Relative %: 77 % (ref 43–77)
WBC: 6.7 10*3/uL (ref 4.0–10.5)

## 2013-07-24 LAB — MRSA PCR SCREENING: MRSA by PCR: NEGATIVE

## 2013-07-24 LAB — BASIC METABOLIC PANEL
BUN: 23 mg/dL (ref 6–23)
CO2: 23 mEq/L (ref 19–32)
Chloride: 101 mEq/L (ref 96–112)
Creatinine, Ser: 2.37 mg/dL — ABNORMAL HIGH (ref 0.50–1.35)
GFR calc Af Amer: 36 mL/min — ABNORMAL LOW (ref >90)
Glucose, Bld: 104 mg/dL — ABNORMAL HIGH (ref 70–99)
Potassium: 4.4 mEq/L (ref 3.5–5.1)

## 2013-07-24 LAB — POCT I-STAT TROPONIN I: Troponin i, poc: 0.08 ng/mL (ref 0.00–0.08)

## 2013-07-24 LAB — ETHANOL: Alcohol, Ethyl (B): <11 mg/dL (ref 0–11)

## 2013-07-24 LAB — PRO B NATRIURETIC PEPTIDE: Pro B Natriuretic peptide (BNP): 66682 pg/mL — ABNORMAL HIGH (ref 0–125)

## 2013-07-24 MED ORDER — FUROSEMIDE 10 MG/ML IJ SOLN
80.0000 mg | Freq: Two times a day (BID) | INTRAMUSCULAR | Status: DC
  Administered 2013-07-24 – 2013-07-25 (×2): 80 mg via INTRAVENOUS
  Filled 2013-07-24 (×4): qty 8

## 2013-07-24 MED ORDER — RANOLAZINE ER 500 MG PO TB12
500.0000 mg | ORAL_TABLET | Freq: Two times a day (BID) | ORAL | Status: DC
  Administered 2013-07-24 – 2013-07-27 (×6): 500 mg via ORAL
  Filled 2013-07-24 (×7): qty 1

## 2013-07-24 MED ORDER — HYDRALAZINE HCL 10 MG PO TABS
10.0000 mg | ORAL_TABLET | Freq: Three times a day (TID) | ORAL | Status: DC
  Administered 2013-07-24 (×2): 10 mg via ORAL
  Filled 2013-07-24 (×5): qty 1

## 2013-07-24 MED ORDER — MORPHINE SULFATE 4 MG/ML IJ SOLN
4.0000 mg | Freq: Once | INTRAMUSCULAR | Status: AC
  Administered 2013-07-24: 4 mg via INTRAVENOUS
  Filled 2013-07-24: qty 1

## 2013-07-24 MED ORDER — ASPIRIN EC 325 MG PO TBEC
325.0000 mg | DELAYED_RELEASE_TABLET | Freq: Every day | ORAL | Status: DC
  Administered 2013-07-25 – 2013-07-27 (×3): 325 mg via ORAL
  Filled 2013-07-24 (×3): qty 1

## 2013-07-24 MED ORDER — ALBUTEROL SULFATE (5 MG/ML) 0.5% IN NEBU
5.0000 mg | INHALATION_SOLUTION | Freq: Once | RESPIRATORY_TRACT | Status: AC
  Administered 2013-07-24: 5 mg via RESPIRATORY_TRACT
  Filled 2013-07-24: qty 1

## 2013-07-24 MED ORDER — HEPARIN SODIUM (PORCINE) 5000 UNIT/ML IJ SOLN
5000.0000 [IU] | Freq: Three times a day (TID) | INTRAMUSCULAR | Status: DC
  Administered 2013-07-24 – 2013-07-27 (×8): 5000 [IU] via SUBCUTANEOUS
  Filled 2013-07-24 (×11): qty 1

## 2013-07-24 MED ORDER — ALBUTEROL SULFATE HFA 108 (90 BASE) MCG/ACT IN AERS
1.0000 | INHALATION_SPRAY | RESPIRATORY_TRACT | Status: DC | PRN
  Filled 2013-07-24: qty 6.7

## 2013-07-24 MED ORDER — SODIUM CHLORIDE 0.9 % IV SOLN: 250.0000 mL | INTRAVENOUS | Status: DC | PRN

## 2013-07-24 MED ORDER — SODIUM CHLORIDE 0.9 % IJ SOLN: 3.0000 mL | INTRAMUSCULAR | Status: DC | PRN

## 2013-07-24 MED ORDER — VANCOMYCIN HCL 1000 MG IV SOLR: 15.0000 mg/kg | Freq: Once | INTRAVENOUS | Status: DC

## 2013-07-24 MED ORDER — ONDANSETRON HCL 4 MG/2ML IJ SOLN: 4.0000 mg | Freq: Four times a day (QID) | INTRAMUSCULAR | Status: DC | PRN

## 2013-07-24 MED ORDER — ASPIRIN 81 MG PO CHEW
324.0000 mg | CHEWABLE_TABLET | Freq: Once | ORAL | Status: DC
  Filled 2013-07-24: qty 4

## 2013-07-24 MED ORDER — CARVEDILOL 6.25 MG PO TABS
6.2500 mg | ORAL_TABLET | Freq: Two times a day (BID) | ORAL | Status: DC
  Administered 2013-07-25: 6.25 mg via ORAL
  Filled 2013-07-24 (×3): qty 1

## 2013-07-24 MED ORDER — NITROGLYCERIN 0.4 MG SL SUBL
0.4000 mg | SUBLINGUAL_TABLET | SUBLINGUAL | Status: DC | PRN
  Administered 2013-07-24 – 2013-07-25 (×2): 0.4 mg via SUBLINGUAL
  Filled 2013-07-24 (×3): qty 25

## 2013-07-24 MED ORDER — SODIUM CHLORIDE 0.9 % IJ SOLN
3.0000 mL | Freq: Two times a day (BID) | INTRAMUSCULAR | Status: DC
  Administered 2013-07-25 – 2013-07-27 (×4): 3 mL via INTRAVENOUS

## 2013-07-24 MED ORDER — NITROGLYCERIN IN D5W 200-5 MCG/ML-% IV SOLN
2.0000 ug/min | INTRAVENOUS | Status: DC
  Administered 2013-07-24: 5 ug/min via INTRAVENOUS
  Administered 2013-07-25: 20 ug/min via INTRAVENOUS
  Filled 2013-07-24 (×3): qty 250

## 2013-07-24 MED ORDER — SIMVASTATIN 20 MG PO TABS
20.0000 mg | ORAL_TABLET | Freq: Every day | ORAL | Status: DC
  Administered 2013-07-25 – 2013-07-26 (×2): 20 mg via ORAL
  Filled 2013-07-24 (×3): qty 1

## 2013-07-24 MED ORDER — BIOTENE DRY MOUTH MT LIQD
15.0000 mL | Freq: Two times a day (BID) | OROMUCOSAL | Status: DC
  Administered 2013-07-24 – 2013-07-27 (×6): 15 mL via OROMUCOSAL

## 2013-07-24 MED ORDER — PIPERACILLIN-TAZOBACTAM 3.375 G IVPB 30 MIN
3.3750 g | Freq: Once | INTRAVENOUS | Status: AC
  Administered 2013-07-24: 3.375 g via INTRAVENOUS
  Filled 2013-07-24: qty 50

## 2013-07-24 MED ORDER — VITAMIN D3 25 MCG (1000 UNIT) PO TABS
1000.0000 [IU] | ORAL_TABLET | Freq: Every day | ORAL | Status: DC
  Administered 2013-07-25 – 2013-07-27 (×3): 1000 [IU] via ORAL
  Filled 2013-07-24 (×3): qty 1

## 2013-07-24 MED ORDER — VANCOMYCIN HCL 10 G IV SOLR
1500.0000 mg | Freq: Once | INTRAVENOUS | Status: AC
  Administered 2013-07-24: 1500 mg via INTRAVENOUS
  Filled 2013-07-24: qty 1500

## 2013-07-24 MED ORDER — ACETAMINOPHEN 325 MG PO TABS
650.0000 mg | ORAL_TABLET | ORAL | Status: DC | PRN
  Administered 2013-07-24: 650 mg via ORAL
  Filled 2013-07-24: qty 2

## 2013-07-24 NOTE — ED Provider Notes (Signed)
CSN: [id number]     Arrival date & time 07/24/13  0865 History   First MD Initiated Contact with Patient 07/24/13 [phone]     Chief Complaint  Patient presents with  . Shortness of Breath   (Consider location/radiation/quality/duration/timing/severity/associated sxs/prior Treatment) HPI Comments: 47 yo male presents with acute chest pain and dyspnea that started a few hours ago. Patient states he had a cough and dyspnea last night, with subjective fever and chills. Noticed bilateral leg swelling starting this morning. Also having chest pain that feels like someone is kicking him in the chest. Feels like prior times he's had PNA. The cough has been productive. He has a history of CHF, CKD and CAD. States this does not feel like his prior CHF exacerbations.   Past Medical History  Diagnosis Date  . CAD (coronary artery disease) 12/2006    NSTEMI 2008 in the setting of profound epistaxis and anemia, last cath 2012 03/2011 (med rx)  . Hypertension   . Hyperlipidemia   . Chronic kidney disease     Average creatine of about 2.3  . Ischemic cardiomyopathy     Prior EF 25%, apical, posterior akinesis, inferior akinesis, moderate LVH, mild MR, mild decreased RV function,  65% EF 2012 echo.  . Anemia   . Pneumonia 05/13/2013   Past Surgical History  Procedure Laterality Date  . Cardiac catheterization  2012    total RCA occlusion with left-to-right collaterals supplying the distal RCA branches, mild diffuse nonobstructive disease of LAD and LCx. Medically managed   Family History  Problem Relation Age of Onset  . Hypertension Mother     deceased  . Diabetes Mother     deceased  . Heart disease Mother     deceased  . Heart attack Father     at age 42, deceased   History  Substance Use Topics  . Smoking status: Former Smoker -- 1.00 packs/day for 10 years    Types: Cigarettes    Quit date: 11/16/2006  . Smokeless tobacco: Never Used  . Alcohol Use: No    Review of Systems   Constitutional: Positive for fever and chills.  Respiratory: Positive for cough, shortness of breath and wheezing.   Cardiovascular: Positive for chest pain and leg swelling.  Gastrointestinal: Negative for vomiting.  All other systems reviewed and are negative.    Allergies  Review of patient's allergies indicates no known allergies.  Home Medications   Current Outpatient Rx  Name  Route  Sig  Dispense  Refill  . aspirin EC 81 MG tablet   Oral   Take 81 mg by mouth daily.         Marland Kitchen azithromycin (ZITHROMAX) 500 MG tablet   Oral   Take 1 tablet (500 mg total) by mouth daily. For another 4days   4 tablet   0   . carvedilol (COREG) 25 MG tablet   Oral   Take 1 tablet (25 mg total) by mouth 2 (two) times daily with a meal.   60 tablet   6   . cholecalciferol (VITAMIN D) 1000 UNITS tablet   Oral   Take 1,000 Units by mouth daily.         . furosemide (LASIX) 40 MG tablet   Oral   Take 1 tablet (40 mg total) by mouth daily.   240 tablet   3   . isosorbide mononitrate (IMDUR) 30 MG 24 hr tablet   Oral   Take 120 mg by mouth daily.         Marland Kitchen  levofloxacin (LEVAQUIN) 500 MG tablet               . nitroGLYCERIN (NITROSTAT) 0.4 MG SL tablet   Sublingual   Place 0.4 mg under the tongue every 5 (five) minutes as needed for chest pain.         . pravastatin (PRAVACHOL) 40 MG tablet   Oral   Take 40 mg by mouth at bedtime.         Marland Kitchen PROAIR HFA 108 (90 BASE) MCG/ACT inhaler               . ranolazine (RANEXA) 500 MG 12 hr tablet   Oral   Take 1 tablet (500 mg total) by mouth 2 (two) times daily.   60 tablet   3   . spironolactone (ALDACTONE) 25 MG tablet      Take 1/2 daily   90 tablet   5    BP 164/135  Pulse 97  Temp(Src) 98.3 F (36.8 C) (Oral)  Resp 22  SpO2 98% Physical Exam  Nursing note and vitals reviewed. Constitutional: He is oriented to person, place, and time. He appears well-developed and well-nourished. No distress.   HENT:  Head: Normocephalic and atraumatic.  Right Ear: External ear normal.  Left Ear: External ear normal.  Nose: Nose normal.  Eyes: Right eye exhibits no discharge. Left eye exhibits no discharge.  Neck: Neck supple.  Cardiovascular: Normal rate, regular rhythm, normal heart sounds and intact distal pulses.   Pulmonary/Chest: Effort normal. He has wheezes.  Abdominal: Soft. There is no tenderness.  Musculoskeletal: He exhibits edema (Extensive bilateral lower extremity edema).  Neurological: He is alert and oriented to person, place, and time.  Skin: Skin is warm and dry.    ED Course  Procedures (including critical care time) Labs Review Labs Reviewed  CBC WITH DIFFERENTIAL - Abnormal; Notable for the following:    Hemoglobin 12.4 (*)    HCT 38.6 (*)    RDW 16.3 (*)    All other components within normal limits  BASIC METABOLIC PANEL - Abnormal; Notable for the following:    Glucose, Bld 104 (*)    Creatinine, Ser 2.37 (*)    GFR calc non Af Amer 31 (*)    GFR calc Af Amer 36 (*)    All other components within normal limits  HEPATIC FUNCTION PANEL - Abnormal; Notable for the following:    Total Bilirubin 1.6 (*)    Indirect Bilirubin 1.3 (*)    All other components within normal limits  PRO B NATRIURETIC PEPTIDE - Abnormal; Notable for the following:    Pro B Natriuretic peptide (BNP) 21308.6 (*)    All other components within normal limits  CULTURE, BLOOD (ROUTINE X 2)  CULTURE, BLOOD (ROUTINE X 2)  CG4 I-STAT (LACTIC ACID)  POCT I-STAT TROPONIN I   Imaging Review Dg Chest Port 1 View  07/24/2013   CLINICAL DATA:  Dyspnea.  Leg swelling.  Cough.  EXAM: PORTABLE CHEST - 1 VIEW  COMPARISON:  06/09/2013  FINDINGS: Cardiomegaly.  Pulmonary edema.  Slight improved aeration right lung base. What remains may represent atelectasis or infiltrate.  Tortuous aorta.  No gross pneumothorax.  IMPRESSION: Cardiomegaly.  Pulmonary edema.  Slight improved aeration right lung base.  What remains may represent atelectasis or infiltrate.  Tortuous aorta.   Electronically Signed   By: Bridgett Larsson M.D.   On: 07/24/2013 10:18    EKG Interpretation    Date/Time:  Friday July 24 2013 10:06:06 EST Ventricular Rate:  91 PR Interval:  180 QRS Duration: 108 QT Interval:  390 QTC Calculation: 480 R Axis:   7 Text Interpretation:  Sinus rhythm Multiform ventricular premature complexes Probable left atrial enlargement Anterior infarct, old Borderline T abnormalities, inferior leads Non-specific ST-t changes Confirmed by Ernesha Ramone  MD, Pamela Intrieri ([id number]) on 07/24/2013 10:13:53 AM            MDM   1. CHF exacerbation   2. Chest pain    His dyspnea is likely CHF but possibly also CAP given his productive cough. Will cover with abx after blood cultures. Taken of CPAP on EMS arrival and is stable on 2L. Minimal increased WOB. Stable for floor. Will admit to cards for CP + CHF exacerbation.    Audree Camel, MD 07/24/13 [phone]

## 2013-07-24 NOTE — Progress Notes (Addendum)
Note: pt is not on ACEI/ARB at this time due to renal insuff. Dayna Dunn PA-C  To clarify prior note - we wanted to get Mr. Gougeon back on a med regimen similar to his 05/2013 DC when we knew his BP regimen was working for him. Thus will put on Coreg 6.25mg  BID and hold spironolactone.   Dayna Dunn PA-C

## 2013-07-24 NOTE — Progress Notes (Signed)
Pt had 10 beat run SVT.  Pt asymptomatic other than BP 205/170.  Marvis Repress, NP notified. Will continue to monitor.

## 2013-07-24 NOTE — ED Notes (Signed)
Pt took home aspirin of 325mg .

## 2013-07-24 NOTE — Progress Notes (Signed)
Unit CM UR Completed by [facility] ED CM  W. Kanin Lia RN  

## 2013-07-24 NOTE — ED Notes (Signed)
EDP aware of BP and pt's movement CP. Morphine orders placed.

## 2013-07-24 NOTE — ED Notes (Signed)
EDP aware of BP, cardiology consult in for patient.

## 2013-07-24 NOTE — ED Notes (Signed)
Pt states that he is having CP, that increases when he coughs, takes a deep breath or moves. Pt states that the pain started after he had difficulty breathing and coughing for several days. Pt alert and oriented. Pt arrived on CPAP and was placed on 2L of O2 by RT per EDP. Pt states that he still feels fine with 2L of O2.

## 2013-07-24 NOTE — H&P (Signed)
History and Physical  Patient ID: [name] MRN: [id number], DOB: [birth year] Date of Encounter: 07/24/2013, 2:01 PM Primary Physician: Josue Hector, MD Primary Cardiologist: Athel Merriweather  Chief Complaint: SOB, cough Reason for Admission: CHF  HPI: [name] is a 47 y/o M with history of CAD (managed medically), HTN, HL, CKD stage III, mixed CM/chronic systolic CHF who presented to [facility] today with cough, LEE, SOB and hypoxia. He had an NSTEMI back in 2008 in the setting of profound anemia/epistaxis with cath showing occluded RCA with collaterals, nonobstructive left system disease. He was managed medically. His LV function has been variable. Back in 2008-2010 it ranged 20-35%. In 2012 he presented with CP and normalized EF 55-60%. Cardiac cath showed total RCA occlusion with left-to-right collaterals supplying the distal RCA branches, mild diffuse nonobstructive disease of LAD and LCx. Again, medical therapy was recommended. In 03/2012 he had CP and abnormal stress test felt consistent with known total occlusion of the RCA. (EF 45% by echo, 34% by nuc then.) It was felt that given lack of ischemic evidence at that time, cath was not indicated but could consider PCI of the TCO although the risk of renal failure was felt to be high. He was started on Ranexa. He was last in the hospital 05/2013 for PNA at which troponins were mildly elevated felt due to renal insufficiency. EF was back down to 15%. Med titration was difficult due to hypotension and a/c renal insufficiency. He was doing well at last OV 06/2013.  He states about 2 weeks ago he began having increasing SOB and LEE. He does not weigh himself regularly. He has had a productive cough of white-to-yellow phlegm. He denies fever but has felt intermittently hot/cold. He has had orthopnea keeping him from sleeping the last 3 nights. This morning he developed chest pain that occurs almost exclusively with coughing, described  as a poking sensation. He reports med compliance but was markedly hypertensive on arrival up to a peak 181/119. He states he checks BP at home with diastolic running upper 90's but was unable to cite home systolics. Denies syncope, palpitations, bleeding issues. POx 88% by EMS, improved on CPAP then [place]. Cr 2.37, pBNP 66k, initial POC troponin neg, lactic acid 1.34. CXR showing cardiomegaly, pulm edema, slightly improved aeration R lung base with what remains may represent atelectasis or infiltrate. WBC wnl and afebrile. Received albuterol, morphine, vancomycin, zosyn, SL NTG. Pt took home ASA 325mg .  Past Medical History  Diagnosis Date  . CAD (coronary artery disease) 12/2006    a. NSTEMI 2008: in setting of profound epistaxis/anemia, cath showing severe diffuse RCA dz with L-R collaterals, nonobst dz in left system, managed medically. b. Type II NSTEMI 10/2008. c. Last cath 2012 - for med rx. d. 05/2013: elevated troponin ? due to ARF.  e. Ranexa added 03/2012.  Marland Kitchen Hypertension     a. Difficult to control. b. Renal duplex neg for RAS 11/2012.  Marland Kitchen Hyperlipidemia   . Chronic kidney disease     Average creatine of about 2.3  . Cardiomyopathy     a. Mixed ICM/NICM - Prior EF 25% back to 2008. b. Improved to 55% in 2012. c. Subsequent echoes: 45% in 11/2010, 15% by echo 05/2013.  Marland Kitchen Anemia     a. 2008 in setting of profound epistaxis.  . Pneumonia 08/2012, 05/2013  . Epistaxis     a. 2008, 2009 a/w significant anemia.  Marland Kitchen SVT (supraventricular tachycardia)     a. 05/2013  tx with 6mg  adenosine in ER.     Most Recent Cardiac Studies:  Cardiac cath 03/19/11: total RCA occlusion with left-to-right collaterals supplying the distal RCA branches, mild diffuse nonobstructive disease of LAD and LCx. Medically managed.  - PROCEDURES:  1. Selective coronary angiography.  2. Catheter placement for selective coronary angiography.  PROCEDURAL INDICATIONS: [name] is a 47 year old gentleman with known CAD. He  also has chronic kidney disease and a history of hypertensive cardiomyopathy. He presented with chest pain. The patient  underwent an echocardiogram yesterday demonstrating improvement in his  overall LV function. His LVEF has previously been 35% and is now up to the 50-55% range. He has marked LVH. Because of his ongoing chest pain, he was referred for cardiac cath.  Risks and indications of the procedure were reviewed with the patient. Informed consent was obtained. The right wrist was prepped, draped, and anesthetized with 1% lidocaine. Using modified Seldinger technique, a 5- French sheath was placed in the right radial artery. 5000 unit s of unfractionated heparin was administered intravenously, 3 mg of verapamil was administered through the radial sheath. Standard Judkins catheters were used for coronary angiography. A pigtail catheter was advanced into the left ventricle to record left ventricular pressure. However, the patient had marked ventricular ectopy and runs of nonsustained VT and the catheter had to be removed. I reattempted to record LV pressure, but the same thing occurred. I suspected that the patient had a small LV cavity based on his echo images and severe LVH. No further attempts were made to record his left ventricular pressure. He tolerated the procedure well. There were no immediate complications. A TR band was used for radial hemostasis.  PROCEDURAL FINDINGS: Aortic pressure is 119/77 with a mean of 96. Right coronary artery: The right coronary artery has total occlusion of the proximal vessel. The distal right coronary artery branches are collateralized from the left coronary artery.  Left mainstem: Widely patent, divides into the LAD and left circumflex. No obstructive disease.  LAD: The LAD is widely patent as it courses down the anterior wall and wraps around the left ventricular apex. The apical LAD supplies a well- formed collateral to the distal right coronary artery. There are  three diagonal branches. The most distal diagonal branch is the largest in  caliber. The LAD has diffuse luminal irregularity with 25-30% stenosis. There is no significant obstructive disease throughout the course of the LAD.  Left circumflex: The left circumflex has diffuse irregularity. There are 30-50% stenosis in the midcircumflex. There is no high-grade obstructive disease visualized. The circumflex supplies a large obtuse marginal branch that gives off multiple sub-branches. The obtuse marginal has diffuse irregularity, but again there is no significant  stenosis visualized throughout the circumflex distribution.  FINAL ASSESSMENT:  1. Total occlusion of the proximal right coronary artery with left-to- right collaterals supplying the distal right coronary artery branches.  2. Mild diffuse nonobstructive disease of the left anterior descending and left circumflex.  RECOMMENDATIONS: The patient will be treated with ongoing medical therapy for treatment of his coronary artery disease and hypertension.   Myoview 11/18/11:  1. Mild reversibility within the the lateral wall and to a lesser  degree in the anteroseptal wall.  2. Left ventricular dilatation stable from rest to stress  3. Global hypokinesia  4. Left ventricular ejection fraction equal 34%  --> felt consistent with known total occlusion of the RCA  2D Echo 05/10/13 - Left ventricle: The cavity size was mildly dilated. Wall  thickness was increased in a pattern of mild LVH. The estimated ejection fraction was 15%. Diffuse hypokinesis. Features are consistent with a pseudonormal left ventricular filling pattern, with concomitant abnormal relaxation and increased filling pressure (grade 2 diastolic dysfunction). - Aortic valve: Trivial regurgitation. - Left atrium: The atrium was moderately dilated. - Right ventricle: Systolic function was mildly reduced. - Pericardium, extracardiac: A trivial pericardial effusion was  identified.  2D Echo 11/2011 - Left ventricle: Mild diffuse hypokinesis. Inferobasal worse The cavity size was mildly dilated. Wall thickness was increased in a pattern of severe LVH. The estimated ejection fraction was 45%. - Aortic valve: Trivial regurgitation. - Left atrium: The atrium was mildly dilated. - Atrial septum: No defect or patent foramen ovale was identified.  2D Echo 03/2011 - Left ventricle: Wall thickness was increased in a pattern of   moderate LVH. Systolic function was normal. The estimated ejection   fraction was in the range of 55% to 60%. Wall motion was normal;   there were no regional wall motion abnormalities. Doppler   parameters are consistent with abnormal left ventricular   relaxation (grade 1 diastolic dysfunction). - Aortic valve: Trivial regurgitation. - Left atrium: The atrium was moderately to severely dilated. - Right atrium: The atrium was mildly dilated.   Surgical History:  Past Surgical History  Procedure Laterality Date  . Cardiac catheterization  2012    total RCA occlusion with left-to-right collaterals supplying the distal RCA branches, mild diffuse nonobstructive disease of LAD and LCx. Medically managed     Home Meds: Prior to Admission medications   Medication Sig Start Date End Date Taking? Authorizing Provider  aspirin 325 MG EC tablet Take 325 mg by mouth daily.   Yes Historical Provider, MD  carvedilol (COREG) 25 MG tablet Take 1 tablet (25 mg total) by mouth 2 (two) times daily with a meal. 06/16/13  Yes Rollene Rotunda, MD  carvedilol (COREG) 6.25 MG tablet Take 6.25 mg by mouth 2 (two) times daily with a meal.   Yes Historical Provider, MD  cholecalciferol (VITAMIN D) 1000 UNITS tablet Take 1,000 Units by mouth daily.   Yes Historical Provider, MD  furosemide (LASIX) 40 MG tablet Take 1 tablet (40 mg total) by mouth daily. 05/15/13  Yes Ripudeep Jenna Luo, MD  hydrALAZINE (APRESOLINE) 10 MG tablet Take 10 mg by mouth 3 (three) times  daily.   Yes Historical Provider, MD  isosorbide mononitrate (IMDUR) 30 MG 24 hr tablet Take 120 mg by mouth daily. 05/15/13  Yes Ripudeep Jenna Luo, MD  nitroGLYCERIN (NITROSTAT) 0.4 MG SL tablet Place 0.4 mg under the tongue every 5 (five) minutes as needed for chest pain.   Yes Historical Provider, MD  pravastatin (PRAVACHOL) 40 MG tablet Take 40 mg by mouth at bedtime.   Yes Historical Provider, MD  PROAIR HFA 108 (90 BASE) MCG/ACT inhaler Inhale 1 puff into the lungs every 4 (four) hours as needed.  06/08/13  Yes Historical Provider, MD  ranolazine (RANEXA) 500 MG 12 hr tablet Take 1 tablet (500 mg total) by mouth 2 (two) times daily. 06/30/13 06/30/14 Yes Rollene Rotunda, MD  spironolactone (ALDACTONE) 25 MG tablet Take 1/2 daily 07/07/13  Yes Rollene Rotunda, MD    Allergies: No Known Allergies  History   Social History  . Marital Status: Legally Separated    Spouse Name: N/A    Number of Children: 6  . Years of Education: N/A   Occupational History  . Unemployed    Social History  Main Topics  . Smoking status: Former Smoker -- 1.00 packs/day for 10 years    Types: Cigarettes    Quit date: 11/16/2006  . Smokeless tobacco: Never Used  . Alcohol Use: No  . Drug Use: Yes     Comment: Previous marijuana use  . Sexual Activity: Not on file   Other Topics Concern  . Not on file   Social History Narrative   The patient lives in [place] with his sister. He is legally separated and has 6 healthy children who do not live with him. He is not employed and is trying to get disability     Family History  Problem Relation Age of Onset  . Hypertension Mother     deceased  . Diabetes Mother     deceased  . Heart disease Mother     deceased  . Heart attack Father     at age 59, deceased    Review of Systems:see above. All other systems reviewed and are otherwise negative except as noted above.  Labs:   Lab Results  Component Value Date   WBC 6.7 07/24/2013   HGB 12.4*  07/24/2013   HCT 38.6* 07/24/2013   MCV 85.0 07/24/2013   PLT 243 07/24/2013     Recent Labs Lab 07/24/13 1123  NA 136  K 4.4  CL 101  CO2 23  BUN 23  CREATININE 2.37*  CALCIUM 9.2  PROT 7.4  BILITOT 1.6*  ALKPHOS 54  ALT 10  AST 16  GLUCOSE 104*   POC troponin 0.08 Lactic acid 1.34  Radiology/Studies:  Dg Chest Port 1 View 07/24/2013   CLINICAL DATA:  Dyspnea.  Leg swelling.  Cough.  EXAM: PORTABLE CHEST - 1 VIEW  COMPARISON:  06/09/2013  FINDINGS: Cardiomegaly.  Pulmonary edema.  Slight improved aeration right lung base. What remains may represent atelectasis or infiltrate.  Tortuous aorta.  No gross pneumothorax.  IMPRESSION: Cardiomegaly.  Pulmonary edema.  Slight improved aeration right lung base. What remains may represent atelectasis or infiltrate.  Tortuous aorta.   Electronically Signed   By: Bridgett Larsson M.D.   On: 07/24/2013 10:18   EKG: NSR 91bpm, old anterior infarct, porbable left atrial enlargement, two multiform PVCs, nonspecific ST-T changes. Not significantly changed from 05/2013.  Physical Exam: Blood pressure 184/127, pulse 39, temperature 98.3 F (36.8 C), temperature source Oral, resp. rate 23, weight 233 lb 0.4 oz (105.7 kg), SpO2 96.00%. General: Well developed, well nourished AAM in no acute distress. Head: Normocephalic, atraumatic, sclera non-icteric, no xanthomas, nares are without discharge. OP clear. Neck: Negative for carotid bruits. JVD mod elevated. Lungs: Bilateral basilar rales with diffuse coarse BS and occasional wheezing. Upper airway wheezing noted as well. Breathing is unlabored. Heart: RRR with S1 S2. No murmurs, rubs, or gallops appreciated. Abdomen: Soft, non-tender, non-distended with normoactive bowel sounds. No hepatomegaly. No rebound/guarding. No obvious abdominal masses. Msk:  Strength and tone appear normal for age. Extremities: No clubbing or cyanosis. 3+ LEE bilaterally edema.  Distal pedal pulses are 2+ and equal  bilaterally. Neuro: Alert and oriented X 3. No focal deficit. No facial asymmetry. Moves all extremities spontaneously. Psych:  Responds to questions appropriately with a flattened affect.    ASSESSMENT AND PLAN:  1. Acute respiratory failure 2. Acute on chronic systolic and diastolic CHF secondary to mixed ICM/NICM (hypertensive heart disease) - EF 15% 05/2013 3. Hypertensive urgency 4. CKD stage III 5. Known CAD with somewhat atypical CP, no objective evidence of ischemia thus  far 6. Hyperlipidemia  He is massively volume overloaded. Suspect presentation is due to CHF. Doubt PNA given normal WBC and afebrile. Hold off further abx for now and culture sputum. Check UDS, EtOH level. Pt endorses compliance but that is questionable given how high BP is in the ER. Will continue BB. Hold Imdur and start IV NTG with titration for BP. Diurese with IV Lasix 80mg  BID, watching renal function carefully. Continue hydralazine, statin, Ranexa, spironolactone. CP is atypical and likely due to CHF. Cycle troponins. Care management consult for home health needs. Dr. Antoine Poche to comment regarding candidacy for ICD.  Signed, Dayna Dunn PA-C 07/24/2013, 2:01 PM   History and all data above reviewed.  Patient examined.  I agree with the findings as above.  I know [name] well.  He presents with evidence of hypertensive urgency and uncontrolled acute on chronic systolic and diastolic HF related to a longstanding history of mixed ischemic and nonischemic cardiomyopathy.  He claims to be compliant.  However, I do not believe this.  His BP and volume are have been easily controlled in the past when he takes medications.  One of the problems is understanding at any given moment what he is taking.  The patient exam reveals COR:RRR  ,  Lungs: Decreased breath sounds  ,  Abd: Hepatic congestion, Ext Severe leg edema,  Neck JVD to the jaw at 45 degrees  .  All available labs, radiology testing, previous records reviewed.  Agree with documented assessment and plan. He needs to be admitted for IV diuresis and BP control with IV NTG for now and to then continue previous medications.  ( I would use the meds he was discharged on last time).  We will watch his creat closely as he has had RI in the past.   Rollene Rotunda  2:23 PM  07/24/2013

## 2013-07-24 NOTE — ED Notes (Signed)
Cardiology at bedside.

## 2013-07-24 NOTE — ED Notes (Signed)
Pt given urinal.

## 2013-07-24 NOTE — ED Notes (Signed)
Per EMS: pt called out for breathing difficulty and CP. Pt has +3 pitting edema bilateral lower legs. Pt having productive cough and ems saw thick dark yellow sputum. Pt placed on CPAP due to 88%RA and tripod position upon ems arrival. Pt given 2 SL nitro. Pt

## 2013-07-25 LAB — DRUGS OF ABUSE SCREEN W/O ALC, ROUTINE URINE
Amphetamine Screen, Ur: NEGATIVE
Barbiturate Quant, Ur: NEGATIVE
Benzodiazepines.: NEGATIVE
Cocaine Metabolites: NEGATIVE
Creatinine,U: 28.5 mg/dL
Methadone: NEGATIVE
Opiate Screen, Urine: NEGATIVE
Phencyclidine (PCP): NEGATIVE

## 2013-07-25 LAB — CBC
HCT: 38.7 % — ABNORMAL LOW (ref 39.0–52.0)
Hemoglobin: 12.5 g/dL — ABNORMAL LOW (ref 13.0–17.0)
MCHC: 32.3 g/dL (ref 30.0–36.0)
MCV: 86.2 fL (ref 78.0–100.0)
RBC: 4.49 MIL/uL (ref 4.22–5.81)
RDW: 16.3 % — ABNORMAL HIGH (ref 11.5–15.5)

## 2013-07-25 LAB — BASIC METABOLIC PANEL
BUN: 24 mg/dL — ABNORMAL HIGH (ref 6–23)
CO2: 29 mEq/L (ref 19–32)
Chloride: 99 mEq/L (ref 96–112)
Creatinine, Ser: 2.71 mg/dL — ABNORMAL HIGH (ref 0.50–1.35)
GFR calc Af Amer: 31 mL/min — ABNORMAL LOW (ref >90)
GFR calc non Af Amer: 27 mL/min — ABNORMAL LOW (ref >90)
Potassium: 3.7 mEq/L (ref 3.5–5.1)

## 2013-07-25 MED ORDER — CARVEDILOL 25 MG PO TABS
25.0000 mg | ORAL_TABLET | Freq: Two times a day (BID) | ORAL | Status: DC
  Administered 2013-07-25 – 2013-07-27 (×4): 25 mg via ORAL
  Filled 2013-07-25 (×6): qty 1

## 2013-07-25 MED ORDER — HYDRALAZINE HCL 25 MG PO TABS
25.0000 mg | ORAL_TABLET | Freq: Three times a day (TID) | ORAL | Status: DC
  Administered 2013-07-25 (×3): 25 mg via ORAL
  Filled 2013-07-25 (×6): qty 1

## 2013-07-25 MED ORDER — CARVEDILOL 6.25 MG PO TABS
18.7500 mg | ORAL_TABLET | Freq: Once | ORAL | Status: AC
  Administered 2013-07-25: 10:00:00 18.75 mg via ORAL
  Filled 2013-07-25 (×2): qty 1

## 2013-07-25 MED ORDER — FUROSEMIDE 10 MG/ML IJ SOLN
60.0000 mg | Freq: Two times a day (BID) | INTRAMUSCULAR | Status: DC
  Administered 2013-07-25 – 2013-07-26 (×3): 60 mg via INTRAVENOUS
  Filled 2013-07-25 (×4): qty 6

## 2013-07-25 NOTE — Progress Notes (Signed)
SUBJECTIVE:  Breathing a little bit better.  No chest pain   PHYSICAL EXAM Filed Vitals:   07/25/13 0300 07/25/13 0400 07/25/13 0500 07/25/13 0600  BP: 180/126 175/114 159/108 183/135  Pulse: 86 73 76 80  Temp:  97.7 F (36.5 C)    TempSrc:  Oral    Resp: 24 15 15    Height:      Weight:   227 lb 15.3 oz (103.4 kg)   SpO2: 95% 96% 95% 96%   General:  No distress Lungs:  Few basilar crackles Heart:  RRR Abdomen:  Positive bowel sounds, no rebound no guarding Extremities:  Decreased edema   LABS: Lab Results  Component Value Date   TROPONINI <0.30 07/25/2013   Results for orders placed during the hospital encounter of 07/24/13 (from the past 24 hour(s))  CBC WITH DIFFERENTIAL     Status: Abnormal   Collection Time    07/24/13 11:23 AM      Result Value Range   WBC 6.7  4.0 - 10.5 K/uL   RBC 4.54  4.22 - 5.81 MIL/uL   Hemoglobin 12.4 (*) 13.0 - 17.0 g/dL   HCT 16.1 (*) 09.6 - 04.5 %   MCV 85.0  78.0 - 100.0 fL   MCH 27.3  26.0 - 34.0 pg   MCHC 32.1  30.0 - 36.0 g/dL   RDW 40.9 (*) 81.1 - 91.4 %   Platelets 243  150 - 400 K/uL   Neutrophils Relative % 77  43 - 77 %   Neutro Abs 5.1  1.7 - 7.7 K/uL   Lymphocytes Relative 15  12 - 46 %   Lymphs Abs 1.0  0.7 - 4.0 K/uL   Monocytes Relative 6  3 - 12 %   Monocytes Absolute 0.4  0.1 - 1.0 K/uL   Eosinophils Relative 1  0 - 5 %   Eosinophils Absolute 0.1  0.0 - 0.7 K/uL   Basophils Relative 1  0 - 1 %   Basophils Absolute 0.0  0.0 - 0.1 K/uL  BASIC METABOLIC PANEL     Status: Abnormal   Collection Time    07/24/13 11:23 AM      Result Value Range   Sodium 136  135 - 145 mEq/L   Potassium 4.4  3.5 - 5.1 mEq/L   Chloride 101  96 - 112 mEq/L   CO2 23  19 - 32 mEq/L   Glucose, Bld 104 (*) 70 - 99 mg/dL   BUN 23  6 - 23 mg/dL   Creatinine, Ser 7.82 (*) 0.50 - 1.35 mg/dL   Calcium 9.2  8.4 - 95.6 mg/dL   GFR calc non Af Amer 31 (*) >90 mL/min   GFR calc Af Amer 36 (*) >90 mL/min  HEPATIC FUNCTION PANEL     Status:  Abnormal   Collection Time    07/24/13 11:23 AM      Result Value Range   Total Protein 7.4  6.0 - 8.3 g/dL   Albumin 3.5  3.5 - 5.2 g/dL   AST 16  0 - 37 U/L   ALT 10  0 - 53 U/L   Alkaline Phosphatase 54  39 - 117 U/L   Total Bilirubin 1.6 (*) 0.3 - 1.2 mg/dL   Bilirubin, Direct 0.3  0.0 - 0.3 mg/dL   Indirect Bilirubin 1.3 (*) 0.3 - 0.9 mg/dL  PRO B NATRIURETIC PEPTIDE     Status: Abnormal   Collection Time    07/24/13  11:23 AM      Result Value Range   Pro B Natriuretic peptide (BNP) 66682.0 (*) 0 - 125 pg/mL  POCT I-STAT TROPONIN I     Status: None   Collection Time    07/24/13 11:53 AM      Result Value Range   Troponin i, poc 0.08  0.00 - 0.08 ng/mL   Comment 3           CG4 I-STAT (LACTIC ACID)     Status: None   Collection Time    07/24/13 11:56 AM      Result Value Range   Lactic Acid, Venous 1.34  0.5 - 2.2 mmol/L  MRSA PCR SCREENING     Status: None   Collection Time    07/24/13  4:40 PM      Result Value Range   MRSA by PCR NEGATIVE  NEGATIVE  ETHANOL     Status: None   Collection Time    07/24/13  7:28 PM      Result Value Range   Alcohol, Ethyl (B) <11  0 - 11 mg/dL  TROPONIN I     Status: None   Collection Time    07/24/13  7:28 PM      Result Value Range   Troponin I <0.30  <0.30 ng/mL  TROPONIN I     Status: None   Collection Time    07/25/13  1:50 AM      Result Value Range   Troponin I <0.30  <0.30 ng/mL  BASIC METABOLIC PANEL     Status: Abnormal   Collection Time    07/25/13  1:50 AM      Result Value Range   Sodium 138  135 - 145 mEq/L   Potassium 3.7  3.5 - 5.1 mEq/L   Chloride 99  96 - 112 mEq/L   CO2 29  19 - 32 mEq/L   Glucose, Bld 109 (*) 70 - 99 mg/dL   BUN 24 (*) 6 - 23 mg/dL   Creatinine, Ser 4.54 (*) 0.50 - 1.35 mg/dL   Calcium 8.6  8.4 - 09.8 mg/dL   GFR calc non Af Amer 27 (*) >90 mL/min   GFR calc Af Amer 31 (*) >90 mL/min  CBC     Status: Abnormal   Collection Time    07/25/13  1:50 AM      Result Value Range   WBC  7.2  4.0 - 10.5 K/uL   RBC 4.49  4.22 - 5.81 MIL/uL   Hemoglobin 12.5 (*) 13.0 - 17.0 g/dL   HCT 11.9 (*) 14.7 - 82.9 %   MCV 86.2  78.0 - 100.0 fL   MCH 27.8  26.0 - 34.0 pg   MCHC 32.3  30.0 - 36.0 g/dL   RDW 56.2 (*) 13.0 - 86.5 %   Platelets 242  150 - 400 K/uL    Intake/Output Summary (Last 24 hours) at 07/25/13 0758 Last data filed at 07/25/13 0600  Gross per 24 hour  Intake 1363.26 ml  Output   3725 ml  Net -2361.74 ml     ASSESSMENT AND PLAN:  HTN:  BP still not controlled.  Continue IV NTG.   I will increase the Coreg and the hydralazine and hope that I can stop the IV NTG laster  ACUTE ON CHRONIC SYSTOLIC HF: Continue IV diuresis.  I will cut back with the increased creat.  CKD:  As above  CAD:  No evidence of active  ischemia.    Rollene Rotunda 07/25/2013 7:58 AM

## 2013-07-26 LAB — BASIC METABOLIC PANEL
BUN: 25 mg/dL — ABNORMAL HIGH (ref 6–23)
CO2: 33 mEq/L — ABNORMAL HIGH (ref 19–32)
Chloride: 97 mEq/L (ref 96–112)
Creatinine, Ser: 2.41 mg/dL — ABNORMAL HIGH (ref 0.50–1.35)
GFR calc Af Amer: 35 mL/min — ABNORMAL LOW (ref >90)
GFR calc non Af Amer: 31 mL/min — ABNORMAL LOW (ref >90)
Glucose, Bld: 87 mg/dL (ref 70–99)
Potassium: 3.4 mEq/L — ABNORMAL LOW (ref 3.5–5.1)

## 2013-07-26 MED ORDER — HYDRALAZINE HCL 25 MG PO TABS
25.0000 mg | ORAL_TABLET | Freq: Three times a day (TID) | ORAL | Status: DC
  Administered 2013-07-26 – 2013-07-27 (×4): 25 mg via ORAL
  Filled 2013-07-26 (×6): qty 1

## 2013-07-26 MED ORDER — HYDRALAZINE HCL 25 MG PO TABS
37.5000 mg | ORAL_TABLET | Freq: Three times a day (TID) | ORAL | Status: DC
  Filled 2013-07-26 (×3): qty 1.5

## 2013-07-26 MED ORDER — ISOSORBIDE MONONITRATE ER 60 MG PO TB24
60.0000 mg | ORAL_TABLET | Freq: Every day | ORAL | Status: DC
  Administered 2013-07-26 – 2013-07-27 (×2): 60 mg via ORAL
  Filled 2013-07-26 (×2): qty 1

## 2013-07-26 NOTE — Progress Notes (Signed)
Pharmacist Heart Failure Core Measure Documentation  Assessment: [name] has an EF documented as 15% on 05/10/13 by ECHO.  Rationale: Heart failure patients with left ventricular systolic dysfunction (LVSD) and an EF < 40% should be prescribed an angiotensin converting enzyme inhibitor (ACEI) or angiotensin receptor blocker (ARB) at discharge unless a contraindication is documented in the medical record.  This patient is not currently on an ACEI or ARB for HF.  This note is being placed in the record in order to provide documentation that a contraindication to the use of these agents is present for this encounter.  ACE Inhibitor or Angiotensin Receptor Blocker is contraindicated (specify all that apply)  []   ACEI allergy AND ARB allergy []   Angioedema []   Moderate or severe aortic stenosis []   Hyperkalemia []   Hypotension []   Renal artery stenosis [x]   Worsening renal function, preexisting renal disease or dysfunction   [name], Tsz-Yin 07/26/2013 12:06 PM

## 2013-07-26 NOTE — Progress Notes (Signed)
    SUBJECTIVE:  Breathing better.  No chest pain   PHYSICAL EXAM Filed Vitals:   07/26/13 0200 07/26/13 0300 07/26/13 0400 07/26/13 0500  BP: 127/99 128/84 127/89 126/97  Pulse: 62 63 62 64  Temp:    98.5 F (36.9 C)  TempSrc:    Oral  Resp:      Height:      Weight:    219 lb 2.2 oz (99.4 kg)  SpO2: 96% 99% 99% 100%   General:  No distress Lungs:  Few basilar crackles Heart:  RRR Abdomen:  Positive bowel sounds, no rebound no guarding Extremities:  Trace edema   LABS:  Results for orders placed during the hospital encounter of 07/24/13 (from the past 24 hour(s))  BASIC METABOLIC PANEL     Status: Abnormal   Collection Time    07/26/13  4:38 AM      Result Value Range   Sodium 139  135 - 145 mEq/L   Potassium 3.4 (*) 3.5 - 5.1 mEq/L   Chloride 97  96 - 112 mEq/L   CO2 33 (*) 19 - 32 mEq/L   Glucose, Bld 87  70 - 99 mg/dL   BUN 25 (*) 6 - 23 mg/dL   Creatinine, Ser 4.09 (*) 0.50 - 1.35 mg/dL   Calcium 8.3 (*) 8.4 - 10.5 mg/dL   GFR calc non Af Amer 31 (*) >90 mL/min   GFR calc Af Amer 35 (*) >90 mL/min    Intake/Output Summary (Last 24 hours) at 07/26/13 0740 Last data filed at 07/26/13 0300  Gross per 24 hour  Intake 1352.2 ml  Output   5225 ml  Net -3872.8 ml     ASSESSMENT AND PLAN:  HTN:  BP still not controlled.  I will add Imdur.  IV NTG is off.    ACUTE ON CHRONIC SYSTOLIC HF: Continue IV diuresis.  Weight is down 18 (?) lbs.  Greater than 6 liters out.  CKD:  Creat is stable.    CAD:  No evidence of active ischemia.    Rollene Rotunda 07/26/2013 7:40 AM

## 2013-07-27 ENCOUNTER — Telehealth: Payer: Self-pay | Admitting: Cardiology

## 2013-07-27 LAB — BASIC METABOLIC PANEL
BUN: 29 mg/dL — ABNORMAL HIGH (ref 6–23)
CO2: 32 mEq/L (ref 19–32)
Calcium: 8.8 mg/dL (ref 8.4–10.5)
Creatinine, Ser: 2.56 mg/dL — ABNORMAL HIGH (ref 0.50–1.35)

## 2013-07-27 MED ORDER — FUROSEMIDE 40 MG PO TABS
40.0000 mg | ORAL_TABLET | Freq: Every day | ORAL | Status: DC
  Administered 2013-07-27: 40 mg via ORAL
  Filled 2013-07-27: qty 1

## 2013-07-27 MED ORDER — POTASSIUM CHLORIDE CRYS ER 20 MEQ PO TBCR
40.0000 meq | EXTENDED_RELEASE_TABLET | Freq: Once | ORAL | Status: AC
  Administered 2013-07-27: 40 meq via ORAL
  Filled 2013-07-27: qty 2

## 2013-07-27 MED ORDER — HYDRALAZINE HCL 25 MG PO TABS: 25.0000 mg | ORAL_TABLET | Freq: Three times a day (TID) | ORAL | Status: DC

## 2013-07-27 MED ORDER — POTASSIUM CHLORIDE ER 20 MEQ PO TBCR: 20.0000 meq | EXTENDED_RELEASE_TABLET | Freq: Every day | ORAL | Status: DC

## 2013-07-27 MED ORDER — ISOSORBIDE MONONITRATE ER 60 MG PO TB24: 60.0000 mg | ORAL_TABLET | Freq: Every day | ORAL | Status: DC

## 2013-07-27 NOTE — Progress Notes (Signed)
    SUBJECTIVE:  Breathing better and at baseline.  No chest pain   PHYSICAL EXAM Filed Vitals:   07/26/13 1348 07/26/13 1746 07/26/13 2056 07/27/13 0520  BP: 115/66 135/94 121/78 117/85  Pulse: 66 77 69 70  Temp: 97.6 F (36.4 C)  97.3 F (36.3 C) 98.5 F (36.9 C)  TempSrc: Oral  Oral Oral  Resp: 18  17 18   Height:      Weight:    215 lb 3.2 oz (97.614 kg)  SpO2: 90%  92% 96%   General:  No distress Lungs: No crackles Heart:  RRR Abdomen:  Positive bowel sounds, no rebound no guarding Extremities:  No edema   LABS:  No results found for this or any previous visit (from the past 24 hour(s)).  Intake/Output Summary (Last 24 hours) at 07/27/13 0624 Last data filed at 07/27/13 0603  Gross per 24 hour  Intake    923 ml  Output   2900 ml  Net  -1977 ml     ASSESSMENT AND PLAN:  HTN:  BP OK on current medications.  Probably home today on this list with Lasix as below.   ACUTE ON CHRONIC SYSTOLIC HF: Change to oral diuretic.  Check BMET today.  If creat is stable home today with 40 mg Lasix and plans for labs Friday.   CKD:  Creat is pending.    CAD:  No evidence of active ischemia.    Fayrene Fearing [facility] 07/27/2013 6:24 AM

## 2013-07-27 NOTE — Discharge Summary (Signed)
Discharge Summary   Patient ID: [name] MRN: [id number], DOB/AGE: Aug 28, 1966 47 y.o. Admit date: 07/24/2013 D/C date:     07/27/2013  Primary Cardiologist: Dr. Antoine Poche  Active Problems:   Acute on chronic combined systolic and diastolic CHF (congestive heart failure)   Hypertensive urgency   HYPERLIPIDEMIA   CAD   CKD (chronic kidney disease), stage III   Hypertensive heart disease   Acute respiratory failure     Hospital Course: [name] is a 47 y/o male with a history of CAD (managed medically), HTN, HL, CKD stage III, and mixed CM/chronic systolic CHF. He presented to [facility] on 07/24/13 with elevated blood pressure (184/127), massive volume overload and respiratory distress. Patient was admitted for hypertensive urgency and uncontrolled acute on chronic systolic and diastolic HF related to a longstanding history of mixed ischemic and nonischemic cardiomyopathy. Patient's EF: 15% by ECHO on 05/2013.   Patient has hypertension that has historically been difficult to control and requires multiple drug therapy. He claimes to be compliant with medications but this is unlikely as his blood pressure is well controlled while medically managed in the hospital.  Patient's hospital course was largely uneventful. His BP was managed with IV NTG until stable on increased doses of coreg and hydralazine and the addition of Imdur. Patient's BP is currently stable at 117/85. He was diuresed with IV Lasix. He had a net loss of  7,971 mL and 18 lb weight reduction. His kidney function was closely monitored and is stable at 2.56, which is close to his baseline.   Patient's potassium was a little low today (3.2) and he was supplemented with potassium. He will be discharged on Lasix 40 mg and Kdur 20 mg daily. He will leave on the same blood pressure medications, however, he is not on and ACE or ARB secondary to his CKD. [name] is followed by Dr. Antoine Poche. The patient will follow  up in the office and if compliance is demonstrated, he may be a candidate for ICD. Patient was seen today by MD and deemed stable for discharge.  Extensive education on medication compliance and heart failure education was provided. All follow up appointments and a BMET on Friday have been scheduled.    Discharge Vitals: Blood pressure 99/61, pulse 72, temperature 98.5 F (36.9 C), temperature source Oral, resp. rate 18, height 5\' 10"  (1.778 m), weight 215 lb 3.2 oz (97.614 kg), SpO2 96.00%.  Labs: Lab Results  Component Value Date   WBC 7.2 07/25/2013   HGB 12.5* 07/25/2013   HCT 38.7* 07/25/2013   MCV 86.2 07/25/2013   PLT 242 07/25/2013    Recent Labs Lab 07/24/13 1123  07/27/13 0535  NA 136  < > 138  K 4.4  < > 3.2*  CL 101  < > 95*  CO2 23  < > 32  BUN 23  < > 29*  CREATININE 2.37*  < > 2.56*  CALCIUM 9.2  < > 8.8  PROT 7.4  --   --   BILITOT 1.6*  --   --   ALKPHOS 54  --   --   ALT 10  --   --   AST 16  --   --   GLUCOSE 104*  < > 99  < > = values in this interval not displayed.  Recent Labs  07/24/13 1928 07/25/13 0150  TROPONINI <0.30 <0.30   Lab Results  Component Value Date   CHOL  Value: 121  ATP III CLASSIFICATION:  <200     mg/dL   Desirable  811-914  mg/dL   Borderline High  >=782    mg/dL   High        9/56/2130   HDL 31* 02/22/2010   LDLCALC  Value: 78        Total Cholesterol/HDL:CHD Risk Coronary Heart Disease Risk Table                     Men   Women  1/2 Average Risk   3.4   3.3  Average Risk       5.0   4.4  2 X Average Risk   9.6   7.1  3 X Average Risk  23.4   11.0        Use the calculated Patient Ratio above and the CHD Risk Table to determine the patient's CHD Risk.        ATP III CLASSIFICATION (LDL):  <100     mg/dL   Optimal  865-784  mg/dL   Near or Above                    Optimal  130-159  mg/dL   Borderline  696-295  mg/dL   High  >284     mg/dL   Very High 1/32/4401   TRIG 60 02/22/2010   Lab Results  Component Value Date    DDIMER  Value: 0.86        AT THE INHOUSE ESTABLISHED CUTOFF VALUE OF 0.48 ug/mL FEU, THIS ASSAY HAS BEEN DOCUMENTED IN THE LITERATURE TO HAVE A SENSITIVITY AND NEGATIVE PREDICTIVE VALUE OF AT LEAST 98 TO 99%.  THE TEST RESULT SHOULD BE CORRELATED WITH AN ASSESSMENT OF THE CLINICAL PROBABILITY OF DVT / VTE.* 02/21/2010    Diagnostic Studies/Procedures   Dg Chest Port 1 View  07/24/2013   CLINICAL DATA:  Dyspnea.  Leg swelling.  Cough.  EXAM: PORTABLE CHEST - 1 VIEW  COMPARISON:  06/09/2013  FINDINGS: Cardiomegaly.  Pulmonary edema.  Slight improved aeration right lung base. What remains may represent atelectasis or infiltrate.  Tortuous aorta.  No gross pneumothorax.  IMPRESSION: Cardiomegaly.  Pulmonary edema.  Slight improved aeration right lung base. What remains may represent atelectasis or infiltrate.  Tortuous aorta.   Electronically Signed   By: Bridgett Larsson M.D.   On: 07/24/2013 10:18    Discharge Medications     Medication List    STOP taking these medications       spironolactone 25 MG tablet  Commonly known as:  ALDACTONE      TAKE these medications       aspirin 325 MG EC tablet  Take 325 mg by mouth daily.     carvedilol 25 MG tablet  Commonly known as:  COREG  Take 1 tablet (25 mg total) by mouth 2 (two) times daily with a meal.     cholecalciferol 1000 UNITS tablet  Commonly known as:  VITAMIN D  Take 1,000 Units by mouth daily.     furosemide 40 MG tablet  Commonly known as:  LASIX  Take 1 tablet (40 mg total) by mouth daily.     hydrALAZINE 25 MG tablet  Commonly known as:  APRESOLINE  Take 1 tablet (25 mg total) by mouth 3 (three) times daily.     isosorbide mononitrate 60 MG 24 hr tablet  Commonly known as:  IMDUR  Take 1 tablet (60 mg total) by  mouth daily.     nitroGLYCERIN 0.4 MG SL tablet  Commonly known as:  NITROSTAT  Place 0.4 mg under the tongue every 5 (five) minutes as needed for chest pain.     Potassium Chloride ER 20 MEQ Tbcr  Take 20  mEq by mouth daily.     pravastatin 40 MG tablet  Commonly known as:  PRAVACHOL  Take 40 mg by mouth at bedtime.     PROAIR HFA 108 (90 BASE) MCG/ACT inhaler  Generic drug:  albuterol  Inhale 1 puff into the lungs every 4 (four) hours as needed.     ranolazine 500 MG 12 hr tablet  Commonly known as:  RANEXA  Take 1 tablet (500 mg total) by mouth 2 (two) times daily.        Disposition   The patient will be discharged in stable condition to home. Discharge Orders   Future Appointments Provider Department Dept Phone   07/31/2013 9:00 AM [facility] [place] Office [phone]   08/04/2013 11:30 AM Beatrice Lecher, PA-C [facility] [phone]   Future Orders Complete By Expires   Discharge patient  As directed      Follow-up Information   Follow up with Tereso Newcomer, PA-C On 08/04/2013. (11:30 am)    Specialty:  Physician Assistant   Contact information:   [address] [place] [address] [phone]       Follow up with [facility] Main Office [facility]) On 07/31/2013. (Lab work- can come anytime between 7:30am-4:30pm)    Specialty:  Cardiology   Contact information:   [address] [place] [address] [phone]        Duration of Discharge Encounter: Greater than 30 minutes including physician and PA time.  SignedVenetia Maxon, KATHRYN PA-C 07/27/2013, 10:18 AM   Patient seen and examined.  Plan as discussed in my rounding note for today and outlined above. Fayrene Fearing [facility]  07/27/2013  2:09 PM

## 2013-07-27 NOTE — Telephone Encounter (Signed)
New problem    TCM with Scott on  12/2 @ 11:30 . Per Florentina Addison  APP

## 2013-07-27 NOTE — Progress Notes (Signed)
Pt being dc to home, dc instructions given to pt, medications and follow up appointments reviewed, pt verbalized understanding, pt stable

## 2013-07-27 NOTE — Progress Notes (Signed)
Patient evaluated for community based chronic disease management services with [facility] Care Management Program as a benefit of patient's [employer]. Spoke with patient at bedside to explain [facility] Care Management services.  Patient will receive a post discharge transition of care call and will be evaluated for monthly home visits for assessments and CHF disease process education.  Patient has a noted history of non adherence to plan of care.  He indicated that he can not afford a scale.  He has an aide from [facility] in from 8:30-12:30 5 x week.  He drives and can get to his appointments.  He expressed that he can get to his PCP's office for same day follow up but needs more help understanding when he should go.  Traditionally he goes directly to the ED for acute SOB.  Explained to patient that multiple early onset symptoms may have been present prior to the SOB.  We assess these education deficits and implement a plan with his PCS provider and PCP office.  Left contact information and [facility] literature at bedside. Made Inpatient Case Manager aware that [facility] Care Management following. Of note, [facility] Care Management services does not replace or interfere with any services that are arranged by inpatient case management or social work.  For additional questions or referrals please contact Anibal Henderson BSN RN [facility] Liaison at [phone].

## 2013-07-28 LAB — THC (MARIJUANA), URINE, CONFIRMATION: Marijuana, Ur-Confirmation: 35 ng/mL

## 2013-07-28 NOTE — Telephone Encounter (Signed)
TCM call Reviewed EF 15% Sodium restriction/ foods reviewed to avoid. Has meals on wheels. Pt does not have a scale/ expressed the importance of daily weights and 3-5 lb rule. meds reviewed and what they are for.  App dates and times reviewed.  Pt told to call with any questions or concerns. Pt verbalized understanding.

## 2013-07-30 LAB — CULTURE, BLOOD (ROUTINE X 2): Culture: NO GROWTH

## 2013-07-31 ENCOUNTER — Other Ambulatory Visit: Payer: Medicare Other

## 2013-08-04 ENCOUNTER — Encounter: Payer: Medicare Other | Admitting: Physician Assistant

## 2013-08-04 NOTE — Progress Notes (Deleted)
[address] [place], [place]  [address] Phone: [phone] Fax:  [phone]  Date:  08/04/2013   ID:  [name], DOB [birth year], MRN [id number]  PCP:  Josue Hector, MD  Cardiologist:  Dr. Rollene Rotunda    History of Present Illness: [name] is a 47 y.o. male with a hx of CAD, mixed ischemic and non-ischemic cardiomyopathy, chronic combined systolic and diastolic CHF, stage 3 CKD, difficult to control HTN, HL.  ***   Recent Labs: 07/24/2013: ALT 10; Pro B Natriuretic peptide (BNP) 84132.4*  07/25/2013: Hemoglobin 12.5*  07/27/2013: Creatinine 2.56*; Potassium 3.2*   Wt Readings from Last 3 Encounters:  07/27/13 215 lb 3.2 oz (97.614 kg)  06/10/13 233 lb (105.688 kg)  05/15/13 226 lb 6.6 oz (102.7 kg)     Past Medical History  Diagnosis Date  . CAD (coronary artery disease) 12/2006    a. NSTEMI 2008: in setting of profound epistaxis/anemia, cath showing severe diffuse RCA dz with L-R collaterals, nonobst dz in left system, managed medically. b. Type II NSTEMI 10/2008. c. Last cath 2012 - for med rx. d. 05/2013: elevated troponin ? due to ARF.  e. Ranexa added 03/2012.  Marland Kitchen Hypertension     a. Difficult to control. b. Renal duplex neg for RAS 11/2012.  Marland Kitchen Hyperlipidemia   . Chronic kidney disease     Average creatine of about 2.3  . Cardiomyopathy     a. Mixed ICM/NICM - Prior EF 25% back to 2008. b. Improved to 55% in 2012. c. Subsequent echoes: 45% in 11/2010, 15% by echo 05/2013.  Marland Kitchen Anemia     a. 2008 in setting of profound epistaxis.  . Pneumonia 08/2012, 05/2013  . Epistaxis     a. 2008, 2009 a/w significant anemia.  Marland Kitchen SVT (supraventricular tachycardia)     a. 05/2013 tx with 6mg  adenosine in ER.    Current Outpatient Prescriptions  Medication Sig Dispense Refill  . aspirin 325 MG EC tablet Take 325 mg by mouth daily.      . carvedilol (COREG) 25 MG tablet Take 1 tablet (25 mg total) by mouth 2 (two) times daily with a meal.  60 tablet   6  . cholecalciferol (VITAMIN D) 1000 UNITS tablet Take 1,000 Units by mouth daily.      . furosemide (LASIX) 40 MG tablet Take 1 tablet (40 mg total) by mouth daily.  240 tablet  3  . hydrALAZINE (APRESOLINE) 25 MG tablet Take 1 tablet (25 mg total) by mouth 3 (three) times daily.  90 tablet  6  . isosorbide mononitrate (IMDUR) 60 MG 24 hr tablet Take 1 tablet (60 mg total) by mouth daily.  30 tablet  6  . nitroGLYCERIN (NITROSTAT) 0.4 MG SL tablet Place 0.4 mg under the tongue every 5 (five) minutes as needed for chest pain.      . potassium chloride 20 MEQ TBCR Take 20 mEq by mouth daily.  30 tablet  6  . pravastatin (PRAVACHOL) 40 MG tablet Take 40 mg by mouth at bedtime.      Marland Kitchen PROAIR HFA 108 (90 BASE) MCG/ACT inhaler Inhale 1 puff into the lungs every 4 (four) hours as needed.       . ranolazine (RANEXA) 500 MG 12 hr tablet Take 1 tablet (500 mg total) by mouth 2 (two) times daily.  60 tablet  3   No current facility-administered medications for this visit.    Allergies:   Review of patient's  allergies indicates no known allergies.   Social History:  The patient  reports that he quit smoking about 6 years ago. His smoking use included Cigarettes. He has a 10 pack-year smoking history. He has never used smokeless tobacco. He reports that he uses illicit drugs. He reports that he does not drink alcohol.   Family History:  The patient's family history includes Diabetes in his mother; Heart attack in his father; Heart disease in his mother; Hypertension in his mother.   ROS:  Please see the history of present illness.   ***   All other systems reviewed and negative.   PHYSICAL EXAM: VS:  There were no vitals taken for this visit. Well nourished, well developed, in no acute distress HEENT: normal Neck: no JVD Cardiac:  normal S1, S2; RRR; no murmur Lungs:  clear to auscultation bilaterally, no wheezing, rhonchi or rales Abd: soft, nontender, no hepatomegaly Ext: no edema Skin: warm  and dry Neuro:  CNs 2-12 intact, no focal abnormalities noted  EKG:  ***     ASSESSMENT AND PLAN:  1. ***  Signed, Tereso Newcomer, PA-C  08/04/2013 11:23 AM

## 2013-08-05 NOTE — Progress Notes (Signed)
This encounter was created in error - please disregard.

## 2013-08-06 ENCOUNTER — Encounter: Payer: Self-pay | Admitting: Physician Assistant

## 2013-08-24 ENCOUNTER — Inpatient Hospital Stay (HOSPITAL_COMMUNITY)
Admission: EM | Admit: 2013-08-24 | Discharge: 2013-08-27 | DRG: 292 | Disposition: A | Discharge status: Home / Self Care | Payer: Medicare Other | Attending: Cardiology | Admitting: Cardiology

## 2013-08-24 ENCOUNTER — Emergency Department (HOSPITAL_COMMUNITY): Payer: Medicare Other

## 2013-08-24 ENCOUNTER — Encounter (HOSPITAL_COMMUNITY): Payer: Self-pay | Admitting: Nurse Practitioner

## 2013-08-24 DIAGNOSIS — Z9119 Patient's noncompliance with other medical treatment and regimen: Secondary | ICD-10-CM

## 2013-08-24 DIAGNOSIS — I5023 Acute on chronic systolic (congestive) heart failure: Principal | ICD-10-CM | POA: Diagnosis present

## 2013-08-24 DIAGNOSIS — R0602 Shortness of breath: Secondary | ICD-10-CM

## 2013-08-24 DIAGNOSIS — Z23 Encounter for immunization: Secondary | ICD-10-CM

## 2013-08-24 DIAGNOSIS — I10 Essential (primary) hypertension: Secondary | ICD-10-CM

## 2013-08-24 DIAGNOSIS — R001 Bradycardia, unspecified: Secondary | ICD-10-CM

## 2013-08-24 DIAGNOSIS — N183 Chronic kidney disease, stage 3 unspecified: Secondary | ICD-10-CM | POA: Diagnosis present

## 2013-08-24 DIAGNOSIS — I160 Hypertensive urgency: Secondary | ICD-10-CM

## 2013-08-24 DIAGNOSIS — I5043 Acute on chronic combined systolic (congestive) and diastolic (congestive) heart failure: Secondary | ICD-10-CM

## 2013-08-24 DIAGNOSIS — E785 Hyperlipidemia, unspecified: Secondary | ICD-10-CM | POA: Diagnosis present

## 2013-08-24 DIAGNOSIS — Z7982 Long term (current) use of aspirin: Secondary | ICD-10-CM

## 2013-08-24 DIAGNOSIS — I2589 Other forms of chronic ischemic heart disease: Secondary | ICD-10-CM | POA: Diagnosis present

## 2013-08-24 DIAGNOSIS — I252 Old myocardial infarction: Secondary | ICD-10-CM

## 2013-08-24 DIAGNOSIS — I509 Heart failure, unspecified: Secondary | ICD-10-CM | POA: Diagnosis present

## 2013-08-24 DIAGNOSIS — I129 Hypertensive chronic kidney disease with stage 1 through stage 4 chronic kidney disease, or unspecified chronic kidney disease: Secondary | ICD-10-CM | POA: Diagnosis present

## 2013-08-24 DIAGNOSIS — R079 Chest pain, unspecified: Secondary | ICD-10-CM

## 2013-08-24 DIAGNOSIS — Z91199 Patient's noncompliance with other medical treatment and regimen due to unspecified reason: Secondary | ICD-10-CM

## 2013-08-24 DIAGNOSIS — I119 Hypertensive heart disease without heart failure: Secondary | ICD-10-CM

## 2013-08-24 DIAGNOSIS — J9600 Acute respiratory failure, unspecified whether with hypoxia or hypercapnia: Secondary | ICD-10-CM

## 2013-08-24 DIAGNOSIS — J189 Pneumonia, unspecified organism: Secondary | ICD-10-CM

## 2013-08-24 DIAGNOSIS — I471 Supraventricular tachycardia: Secondary | ICD-10-CM

## 2013-08-24 DIAGNOSIS — D649 Anemia, unspecified: Secondary | ICD-10-CM

## 2013-08-24 DIAGNOSIS — I2510 Atherosclerotic heart disease of native coronary artery without angina pectoris: Secondary | ICD-10-CM | POA: Diagnosis present

## 2013-08-24 DIAGNOSIS — R7989 Other specified abnormal findings of blood chemistry: Secondary | ICD-10-CM

## 2013-08-24 DIAGNOSIS — Z79899 Other long term (current) drug therapy: Secondary | ICD-10-CM

## 2013-08-24 DIAGNOSIS — Z87891 Personal history of nicotine dependence: Secondary | ICD-10-CM

## 2013-08-24 HISTORY — DX: Personal history of pneumonia (recurrent): Z87.01

## 2013-08-24 LAB — CBC WITH DIFFERENTIAL/PLATELET
Basophils Absolute: 0 10*3/uL (ref 0.0–0.1)
HCT: 40 % (ref 39.0–52.0)
Hemoglobin: 12.5 g/dL — ABNORMAL LOW (ref 13.0–17.0)
Lymphocytes Relative: 14 % (ref 12–46)
MCH: 27.2 pg (ref 26.0–34.0)
Monocytes Absolute: 0.6 10*3/uL (ref 0.1–1.0)
Neutro Abs: 9.5 10*3/uL — ABNORMAL HIGH (ref 1.7–7.7)
Platelets: 270 10*3/uL (ref 150–400)
RDW: 16.2 % — ABNORMAL HIGH (ref 11.5–15.5)
WBC: 11.8 10*3/uL — ABNORMAL HIGH (ref 4.0–10.5)

## 2013-08-24 LAB — CBC
MCH: 27.3 pg (ref 26.0–34.0)
MCHC: 31.1 g/dL (ref 30.0–36.0)
MCV: 87.8 fL (ref 78.0–100.0)
Platelets: 214 10*3/uL (ref 150–400)
RBC: 4.18 MIL/uL — ABNORMAL LOW (ref 4.22–5.81)
RDW: 16.2 % — ABNORMAL HIGH (ref 11.5–15.5)

## 2013-08-24 LAB — COMPREHENSIVE METABOLIC PANEL
ALT: 9 U/L (ref 0–53)
AST: 16 U/L (ref 0–37)
Alkaline Phosphatase: 57 U/L (ref 39–117)
BUN: 26 mg/dL — ABNORMAL HIGH (ref 6–23)
CO2: 27 mEq/L (ref 19–32)
Calcium: 8.7 mg/dL (ref 8.4–10.5)
Chloride: 100 mEq/L (ref 96–112)
GFR calc Af Amer: 38 mL/min — ABNORMAL LOW (ref >90)
GFR calc non Af Amer: 33 mL/min — ABNORMAL LOW (ref >90)
Glucose, Bld: 142 mg/dL — ABNORMAL HIGH (ref 70–99)
Sodium: 137 mEq/L (ref 135–145)
Total Protein: 7.1 g/dL (ref 6.0–8.3)

## 2013-08-24 LAB — CREATININE, SERUM: Creatinine, Ser: 2.29 mg/dL — ABNORMAL HIGH (ref 0.50–1.35)

## 2013-08-24 LAB — TROPONIN I
Troponin I: <0.3 ng/mL (ref <0.30)
Troponin I: <0.3 ng/mL (ref <0.30)
Troponin I: <0.3 ng/mL (ref <0.30)

## 2013-08-24 LAB — PRO B NATRIURETIC PEPTIDE: Pro B Natriuretic peptide (BNP): >70000 pg/mL — ABNORMAL HIGH (ref 0–125)

## 2013-08-24 LAB — POCT I-STAT TROPONIN I: Troponin i, poc: 0.09 ng/mL (ref 0.00–0.08)

## 2013-08-24 MED ORDER — NITROGLYCERIN 0.4 MG SL SUBL: 0.4000 mg | SUBLINGUAL_TABLET | SUBLINGUAL | Status: DC | PRN

## 2013-08-24 MED ORDER — HYDRALAZINE HCL 25 MG PO TABS
25.0000 mg | ORAL_TABLET | Freq: Three times a day (TID) | ORAL | Status: DC
  Administered 2013-08-24 – 2013-08-26 (×7): 25 mg via ORAL
  Filled 2013-08-24 (×11): qty 1

## 2013-08-24 MED ORDER — FUROSEMIDE 10 MG/ML IJ SOLN
40.0000 mg | Freq: Once | INTRAMUSCULAR | Status: AC
  Administered 2013-08-24: 40 mg via INTRAVENOUS
  Filled 2013-08-24: qty 4

## 2013-08-24 MED ORDER — SIMVASTATIN 20 MG PO TABS
20.0000 mg | ORAL_TABLET | Freq: Every day | ORAL | Status: DC
  Administered 2013-08-24 – 2013-08-26 (×3): 20 mg via ORAL
  Filled 2013-08-24 (×4): qty 1

## 2013-08-24 MED ORDER — PNEUMOCOCCAL VAC POLYVALENT 25 MCG/0.5ML IJ INJ
0.5000 mL | INJECTION | INTRAMUSCULAR | Status: AC
  Administered 2013-08-25: 0.5 mL via INTRAMUSCULAR
  Filled 2013-08-24: qty 0.5

## 2013-08-24 MED ORDER — ONDANSETRON HCL 4 MG/2ML IJ SOLN
4.0000 mg | Freq: Once | INTRAMUSCULAR | Status: AC
  Administered 2013-08-24: 4 mg via INTRAVENOUS

## 2013-08-24 MED ORDER — VITAMIN D3 25 MCG (1000 UNIT) PO TABS
1000.0000 [IU] | ORAL_TABLET | Freq: Every day | ORAL | Status: DC
  Administered 2013-08-24 – 2013-08-27 (×4): 1000 [IU] via ORAL
  Filled 2013-08-24 (×4): qty 1

## 2013-08-24 MED ORDER — ASPIRIN 81 MG PO CHEW
324.0000 mg | CHEWABLE_TABLET | Freq: Once | ORAL | Status: AC
  Administered 2013-08-24: 324 mg via ORAL
  Filled 2013-08-24: qty 4

## 2013-08-24 MED ORDER — HEPARIN (PORCINE) IN NACL 100-0.45 UNIT/ML-% IJ SOLN
1300.0000 [IU]/h | INTRAMUSCULAR | Status: DC
  Administered 2013-08-24: 1300 [IU]/h via INTRAVENOUS
  Filled 2013-08-24: qty 250

## 2013-08-24 MED ORDER — FUROSEMIDE 10 MG/ML IJ SOLN
80.0000 mg | Freq: Two times a day (BID) | INTRAMUSCULAR | Status: DC
  Administered 2013-08-24 (×2): 80 mg via INTRAVENOUS
  Filled 2013-08-24 (×4): qty 8

## 2013-08-24 MED ORDER — ASPIRIN EC 81 MG PO TBEC
81.0000 mg | DELAYED_RELEASE_TABLET | Freq: Every day | ORAL | Status: DC
  Administered 2013-08-24 – 2013-08-27 (×4): 81 mg via ORAL
  Filled 2013-08-24 (×4): qty 1

## 2013-08-24 MED ORDER — POTASSIUM CHLORIDE ER 10 MEQ PO TBCR
20.0000 meq | EXTENDED_RELEASE_TABLET | Freq: Every day | ORAL | Status: DC
  Administered 2013-08-24 – 2013-08-27 (×4): 20 meq via ORAL
  Filled 2013-08-24 (×5): qty 2

## 2013-08-24 MED ORDER — ONDANSETRON HCL 4 MG/2ML IJ SOLN: 4.0000 mg | Freq: Four times a day (QID) | INTRAMUSCULAR | Status: DC | PRN

## 2013-08-24 MED ORDER — ACETAMINOPHEN 325 MG PO TABS
650.0000 mg | ORAL_TABLET | Freq: Once | ORAL | Status: AC
  Administered 2013-08-24: 650 mg via ORAL
  Filled 2013-08-24: qty 2

## 2013-08-24 MED ORDER — NITROGLYCERIN IN D5W 200-5 MCG/ML-% IV SOLN
5.0000 ug/min | INTRAVENOUS | Status: DC
  Administered 2013-08-24: 5 ug/min via INTRAVENOUS

## 2013-08-24 MED ORDER — SODIUM CHLORIDE 0.9 % IJ SOLN
3.0000 mL | Freq: Two times a day (BID) | INTRAMUSCULAR | Status: DC
  Administered 2013-08-24 – 2013-08-27 (×6): 3 mL via INTRAVENOUS

## 2013-08-24 MED ORDER — HEPARIN BOLUS VIA INFUSION
4000.0000 [IU] | Freq: Once | INTRAVENOUS | Status: AC
  Administered 2013-08-24: 4000 [IU] via INTRAVENOUS
  Filled 2013-08-24: qty 4000

## 2013-08-24 MED ORDER — SODIUM CHLORIDE 0.9 % IJ SOLN: 3.0000 mL | INTRAMUSCULAR | Status: DC | PRN

## 2013-08-24 MED ORDER — INFLUENZA VAC SPLIT QUAD 0.5 ML IM SUSP
0.5000 mL | INTRAMUSCULAR | Status: AC
  Administered 2013-08-25: 0.5 mL via INTRAMUSCULAR
  Filled 2013-08-24: qty 0.5

## 2013-08-24 MED ORDER — CARVEDILOL 25 MG PO TABS
25.0000 mg | ORAL_TABLET | Freq: Two times a day (BID) | ORAL | Status: DC
  Administered 2013-08-24 – 2013-08-27 (×7): 25 mg via ORAL
  Filled 2013-08-24 (×9): qty 1

## 2013-08-24 MED ORDER — SODIUM CHLORIDE 0.9 % IV SOLN: 250.0000 mL | INTRAVENOUS | Status: DC | PRN

## 2013-08-24 MED ORDER — HEPARIN SODIUM (PORCINE) 5000 UNIT/ML IJ SOLN
5000.0000 [IU] | Freq: Three times a day (TID) | INTRAMUSCULAR | Status: DC
  Administered 2013-08-24 – 2013-08-27 (×9): 5000 [IU] via SUBCUTANEOUS
  Filled 2013-08-24 (×12): qty 1

## 2013-08-24 MED ORDER — RANOLAZINE ER 500 MG PO TB12
500.0000 mg | ORAL_TABLET | Freq: Two times a day (BID) | ORAL | Status: DC
  Administered 2013-08-24 – 2013-08-27 (×7): 500 mg via ORAL
  Filled 2013-08-24 (×8): qty 1

## 2013-08-24 MED ORDER — ALBUTEROL SULFATE HFA 108 (90 BASE) MCG/ACT IN AERS
1.0000 | INHALATION_SPRAY | RESPIRATORY_TRACT | Status: DC | PRN
  Filled 2013-08-24: qty 6.7

## 2013-08-24 MED ORDER — ACETAMINOPHEN 325 MG PO TABS: 650.0000 mg | ORAL_TABLET | ORAL | Status: DC | PRN

## 2013-08-24 NOTE — ED Notes (Signed)
Dr Norlene Campbell saw a copy of the troponin results .09

## 2013-08-24 NOTE — Progress Notes (Signed)
Utilization Review Completed.  

## 2013-08-24 NOTE — ED Notes (Signed)
ECG delayed due to pt demanding to have water Pt went into bathroom to get water. Waited for Pt to come out of the bathroom ECG complete when Pt was done.

## 2013-08-24 NOTE — ED Provider Notes (Addendum)
CSN: [id number]     Arrival date & time 08/24/13  0400 History   First MD Initiated Contact with Patient 08/24/13 [phone]     Chief Complaint  Patient presents with  . Shortness of Breath  . Cough   (Consider location/radiation/quality/duration/timing/severity/associated sxs/prior Treatment) HPI 47 yo male presents to the ER from home via EMS with complaint of chest pain, shortness of breath.  Pt has h/o CAD, CHF, EF of 15%.  Pt denies missing any medications, no diet indiscretions.  Pain did not improve with NTG.  Pt reports cough is productive.  He has had sweats, but no known fevers.  Pt noted to be HTN upon arrival.  He has taken aspirin prior to arrival.  Chest pain is left sided, sharp.  Pt with increased pitting edema.  Does not know if his weight has changed.  Past Medical History  Diagnosis Date  . CAD (coronary artery disease) 12/2006    a. NSTEMI 2008: in setting of profound epistaxis/anemia, cath showing severe diffuse RCA dz with L-R collaterals, nonobst dz in left system, managed medically. b. Type II NSTEMI 10/2008. c. Last cath 2012 - for med rx. d. 05/2013: elevated troponin ? due to ARF.  e. Ranexa added 03/2012.  Marland Kitchen Hypertension     a. Difficult to control. b. Renal duplex neg for RAS 11/2012.  Marland Kitchen Hyperlipidemia   . Chronic kidney disease     Average creatine of about 2.3  . Cardiomyopathy     a. Mixed ICM/NICM - Prior EF 25% back to 2008. b. Improved to 55% in 2012. c. Subsequent echoes: 45% in 11/2010, 15% by echo 05/2013.  Marland Kitchen Anemia     a. 2008 in setting of profound epistaxis.  . Pneumonia 08/2012, 05/2013  . Epistaxis     a. 2008, 2009 a/w significant anemia.  Marland Kitchen SVT (supraventricular tachycardia)     a. 05/2013 tx with 6mg  adenosine in ER.   Past Surgical History  Procedure Laterality Date  . Cardiac catheterization  2012    total RCA occlusion with left-to-right collaterals supplying the distal RCA branches, mild diffuse nonobstructive disease of LAD and LCx. Medically  managed   Family History  Problem Relation Age of Onset  . Hypertension Mother     deceased  . Diabetes Mother     deceased  . Heart disease Mother     deceased  . Heart attack Father     at age 13, deceased   History  Substance Use Topics  . Smoking status: Former Smoker -- 1.00 packs/day for 10 years    Types: Cigarettes    Quit date: 11/16/2006  . Smokeless tobacco: Never Used  . Alcohol Use: No    Review of Systems  See History of Present Illness; otherwise all other systems are reviewed and negative Allergies  Review of patient's allergies indicates no known allergies.  Home Medications   Current Outpatient Rx  Name  Route  Sig  Dispense  Refill  . aspirin 325 MG EC tablet   Oral   Take 325 mg by mouth daily.         . carvedilol (COREG) 25 MG tablet   Oral   Take 1 tablet (25 mg total) by mouth 2 (two) times daily with a meal.   60 tablet   6   . cholecalciferol (VITAMIN D) 1000 UNITS tablet   Oral   Take 1,000 Units by mouth daily.         . furosemide (  LASIX) 40 MG tablet   Oral   Take 1 tablet (40 mg total) by mouth daily.   240 tablet   3   . hydrALAZINE (APRESOLINE) 25 MG tablet   Oral   Take 1 tablet (25 mg total) by mouth 3 (three) times daily.   90 tablet   6   . isosorbide mononitrate (IMDUR) 60 MG 24 hr tablet   Oral   Take 1 tablet (60 mg total) by mouth daily.   30 tablet   6   . potassium chloride 20 MEQ TBCR   Oral   Take 20 mEq by mouth daily.   30 tablet   6   . pravastatin (PRAVACHOL) 40 MG tablet   Oral   Take 40 mg by mouth at bedtime.         Marland Kitchen PROAIR HFA 108 (90 BASE) MCG/ACT inhaler   Inhalation   Inhale 1 puff into the lungs every 4 (four) hours as needed.          . ranolazine (RANEXA) 500 MG 12 hr tablet   Oral   Take 1 tablet (500 mg total) by mouth 2 (two) times daily.   60 tablet   3   . nitroGLYCERIN (NITROSTAT) 0.4 MG SL tablet   Sublingual   Place 0.4 mg under the tongue every 5 (five)  minutes as needed for chest pain.          BP 177/130  Pulse 99  Temp(Src) 97.1 F (36.2 C)  Resp 27  Ht 5\' 10"  (1.778 m)  Wt 215 lb 2.7 oz (97.6 kg)  BMI 30.87 kg/m2  SpO2 96% Physical Exam  Constitutional: He is oriented to person, place, and time. He appears well-developed and well-nourished. He appears distressed (uncomfortable appearing).  HENT:  Head: Normocephalic and atraumatic.  Nose: Nose normal.  Mouth/Throat: Oropharynx is clear and moist.  Eyes: Conjunctivae and EOM are normal. Pupils are equal, round, and reactive to light.  Neck: Normal range of motion. Neck supple. No JVD present. No tracheal deviation present. No thyromegaly present.  Cardiovascular: Normal rate, regular rhythm, normal heart sounds and intact distal pulses.  Exam reveals no gallop and no friction rub.   No murmur heard. Pulmonary/Chest: Effort normal. No stridor. No respiratory distress. He has no wheezes. He has rales (right sided). He exhibits no tenderness.  Abdominal: Soft. Bowel sounds are normal. He exhibits no distension and no mass. There is no tenderness. There is no rebound and no guarding.  Musculoskeletal: Normal range of motion. He exhibits edema (2+to knees). He exhibits no tenderness.  Lymphadenopathy:    He has no cervical adenopathy.  Neurological: He is alert and oriented to person, place, and time. He exhibits normal muscle tone. Coordination normal.  Skin: Skin is warm and dry. No rash noted. No erythema. No pallor.  Psychiatric: He has a normal mood and affect. His behavior is normal. Judgment and thought content normal.    ED Course  Procedures (including critical care time)  CRITICAL CARE Performed by: Olivia Mackie Total critical care time: 30 min Critical care time was exclusive of separately billable procedures and treating other patients. Critical care was necessary to treat or prevent imminent or life-threatening deterioration. Critical care was time spent  personally by me on the following activities: development of treatment plan with patient and/or surrogate as well as nursing, discussions with consultants, evaluation of patient's response to treatment, examination of patient, obtaining history from patient or surrogate, ordering and performing  treatments and interventions, ordering and review of laboratory studies, ordering and review of radiographic studies, pulse oximetry and re-evaluation of patient's condition.  Labs Review Labs Reviewed  COMPREHENSIVE METABOLIC PANEL - Abnormal; Notable for the following:    Glucose, Bld 142 (*)    BUN 26 (*)    Creatinine, Ser 2.27 (*)    Albumin 3.3 (*)    Total Bilirubin 1.8 (*)    GFR calc non Af Amer 33 (*)    GFR calc Af Amer 38 (*)    All other components within normal limits  CBC WITH DIFFERENTIAL - Abnormal; Notable for the following:    WBC 11.8 (*)    Hemoglobin 12.5 (*)    RDW 16.2 (*)    Neutrophils Relative % 81 (*)    Neutro Abs 9.5 (*)    All other components within normal limits  PRO B NATRIURETIC PEPTIDE - Abnormal; Notable for the following:    Pro B Natriuretic peptide (BNP) >70000.0 (*)    All other components within normal limits  POCT I-STAT TROPONIN I - Abnormal; Notable for the following:    Troponin i, poc 0.09 (*)    All other components within normal limits  TROPONIN I  HEPARIN LEVEL (UNFRACTIONATED)   Imaging Review No results found.  EKG Interpretation    Date/Time:  Monday August 24 2013 04:37:03 EST Ventricular Rate:  100 PR Interval:  168 QRS Duration: 106 QT Interval:  384 QTC Calculation: 495 R Axis:   -52 Text Interpretation:  Sinus rhythm with occasional Premature ventricular complexes Possible Left atrial enlargement Left anterior fascicular block Possible Inferior infarct , age undetermined Anterolateral infarct , age undetermined Abnormal ECG Confirmed by Thresea Doble  MD, Zeeshan Korte ([id number]) on 08/24/2013 5:16:18 AM            MDM   1.  Hypertensive urgency   2. CHF exacerbation   3. Elevated troponin    47 yo male with probable CHF exacerbation, HTN urgency.  Elevated POC troponin.  Will start heparin, NTG drips.  Will d/w cardiology for admission.    Olivia Mackie, MD 08/24/13 1191  Olivia Mackie, MD 08/24/13 [phone]

## 2013-08-24 NOTE — Progress Notes (Signed)
ANTICOAGULATION CONSULT NOTE - Initial Consult  Pharmacy Consult for heparin Indication: chest pain/ACS  No Known Allergies  Patient Measurements: Height: 5\' 10"  (177.8 cm) Weight: 215 lb 2.7 oz (97.6 kg) IBW/kg (Calculated) : 73 Heparin Dosing Weight: 95kg  Vital Signs: Temp: 97.1 F (36.2 C) (12/22 0414) BP: 177/130 mmHg (12/22 0506) Pulse Rate: 99 (12/22 0507)  Estimated Creatinine Clearance: 42.2 ml/min (by C-G formula based on Cr of 2.56).   Medical History: Past Medical History  Diagnosis Date  . CAD (coronary artery disease) 12/2006    a. NSTEMI 2008: in setting of profound epistaxis/anemia, cath showing severe diffuse RCA dz with L-R collaterals, nonobst dz in left system, managed medically. b. Type II NSTEMI 10/2008. c. Last cath 2012 - for med rx. d. 05/2013: elevated troponin ? due to ARF.  e. Ranexa added 03/2012.  Marland Kitchen Hypertension     a. Difficult to control. b. Renal duplex neg for RAS 11/2012.  Marland Kitchen Hyperlipidemia   . Chronic kidney disease     Average creatine of about 2.3  . Cardiomyopathy     a. Mixed ICM/NICM - Prior EF 25% back to 2008. b. Improved to 55% in 2012. c. Subsequent echoes: 45% in 11/2010, 15% by echo 05/2013.  Marland Kitchen Anemia     a. 2008 in setting of profound epistaxis.  . Pneumonia 08/2012, 05/2013  . Epistaxis     a. 2008, 2009 a/w significant anemia.  Marland Kitchen SVT (supraventricular tachycardia)     a. 05/2013 tx with 6mg  adenosine in ER.     Assessment: 47yo male c/o SOB and CP, has prior h/o CAD, to begin heparin.  Goal of Therapy:  Heparin level 0.3-0.7 units/ml Monitor platelets by anticoagulation protocol: Yes   Plan:  Will give heparin 4000 units IV bolus x1 followed by gtt at 1300 units/hr and monitor heparin levels and CBC.  Vernard Gambles, PharmD, BCPS  08/24/2013,5:31 AM

## 2013-08-24 NOTE — H&P (Signed)
Patient ID: [name] MRN: [id number], DOB/AGE: 1966/02/01   Admit date: 08/24/2013  Primary Physician: Josue Hector, MD Primary Cardiologist: Shela Commons. Hochrein, MD   Pt. Profile:  47 y/o male with a h/o mixed ICM/NICM, systolic chf, noncompliance, and multiple admissions related to hypertensive urgency, who presented to the ED this morning with recurrent heart failure.  Problem List  Past Medical History  Diagnosis Date  . CAD (coronary artery disease)     a. NSTEMI 2008: in setting of profound epistaxis/anemia, cath showing severe diffuse RCA dz with L-R collaterals, nonobst dz in left system, managed medically. b. Type II NSTEMI 10/2008. c. Last cath 2012 - for med rx. d. 05/2013: elevated troponin ? due to ARF.  e. Ranexa added 03/2012.  Marland Kitchen Hypertension     a. Difficult to control. b. Renal duplex neg for RAS 11/2012.  Marland Kitchen Hyperlipidemia   . Chronic kidney disease     a. Average creatine of about 2.3  . Cardiomyopathy     a. Mixed ICM/NICM - Prior EF 25% back to 2008. b. Improved to 55% in 2012. c. Subsequent echoes: 45% in 11/2010, 15% by echo 05/2013.  Marland Kitchen Anemia     a. 2008 in setting of profound epistaxis.  Marland Kitchen History of pneumonia 08/2012, 05/2013  . Epistaxis     a. 2008, 2009 a/w significant anemia.  Marland Kitchen SVT (supraventricular tachycardia)     a. 05/2013 tx with 6mg  adenosine in ER.    Past Surgical History  Procedure Laterality Date  . Cardiac catheterization  2012    total RCA occlusion with left-to-right collaterals supplying the distal RCA branches, mild diffuse nonobstructive disease of LAD and LCx. Medically managed    Allergies  No Known Allergies  HPI  47 y/o male with a h/o mixed ischemic and nonischemic cardiomyopathy and chronic systolic congestive heart failure with last documented ejection fraction of 15%. Patient has had multiple admissions over the years the setting of hypertensive urgency and medication noncompliance. He was last admitted on November  21 and subsequently discharged on November 24 after an 18 pound diuresis. At discharge, a transition of care appointment was made however he failed to followup. He says that he's been doing well and has been compliant with medications. He prepares his own meals and avoid salt. Yesterday, shortly after eating a salad and drinking some juice, he began to feel nauseated and subsequently vomited. For the remainder of the day, he alternating chills and subjective fevers, and also noted abdominal bloating, dyspnea, and significant lower extremity edema. He had not noticed the edema prior to yesterday. Symptoms progressed throughout the evening and overnight prompting him to present to the ED early this morning. Here, he was hypertensive with a BNP of greater than 70,000 a chest x-ray that shows pulmonary edema. His white blood cell count is mildly elevated however he is afebrile. His creatinine is 2.27, which is slightly better than his baseline. His blood pressure this morning is 195/147.  Home Medications  Prior to Admission medications   Medication Sig Start Date End Date Taking? Authorizing Provider  aspirin 325 MG EC tablet Take 325 mg by mouth daily.   Yes Historical Provider, MD  carvedilol (COREG) 25 MG tablet Take 1 tablet (25 mg total) by mouth 2 (two) times daily with a meal. 06/16/13  Yes Rollene Rotunda, MD  cholecalciferol (VITAMIN D) 1000 UNITS tablet Take 1,000 Units by mouth daily.   Yes Historical Provider, MD  furosemide (LASIX) 40 MG tablet  Take 1 tablet (40 mg total) by mouth daily. 05/15/13  Yes Ripudeep Jenna Luo, MD  hydrALAZINE (APRESOLINE) 25 MG tablet Take 1 tablet (25 mg total) by mouth 3 (three) times daily. 07/27/13  Yes Thereasa Parkin, PA-C  isosorbide mononitrate (IMDUR) 60 MG 24 hr tablet Take 1 tablet (60 mg total) by mouth daily. 07/27/13  Yes Thereasa Parkin, PA-C  potassium chloride 20 MEQ TBCR Take 20 mEq by mouth daily. 07/27/13  Yes Thereasa Parkin, PA-C  pravastatin (PRAVACHOL)  40 MG tablet Take 40 mg by mouth at bedtime.   Yes Historical Provider, MD  PROAIR HFA 108 (90 BASE) MCG/ACT inhaler Inhale 1 puff into the lungs every 4 (four) hours as needed.  06/08/13  Yes Historical Provider, MD  ranolazine (RANEXA) 500 MG 12 hr tablet Take 1 tablet (500 mg total) by mouth 2 (two) times daily. 06/30/13 06/30/14 Yes Rollene Rotunda, MD  nitroGLYCERIN (NITROSTAT) 0.4 MG SL tablet Place 0.4 mg under the tongue every 5 (five) minutes as needed for chest pain.    Historical Provider, MD   Family History  Family History  Problem Relation Age of Onset  . Hypertension Mother     deceased  . Diabetes Mother     deceased  . Heart disease Mother     deceased  . Heart attack Father     at age 48, deceased   Social History  History   Social History  . Marital Status: Legally Separated    Spouse Name: N/A    Number of Children: 6  . Years of Education: N/A   Occupational History  . Unemployed    Social History Main Topics  . Smoking status: Former Smoker -- 1.00 packs/day for 10 years    Types: Cigarettes    Quit date: 11/16/2006  . Smokeless tobacco: Never Used  . Alcohol Use: No  . Drug Use: Yes     Comment: Previous marijuana use  . Sexual Activity: Not on file   Other Topics Concern  . Not on file   Social History Narrative   The patient lives in [place] with his sister. He is legally separated and has 6 healthy children who do not live with him. He is not employed and is trying to get disability    Review of Systems General:  He has been having chills, fever overnight, no night sweats. He does not weigh himself. Cardiovascular:  No chest pain, +++ dyspnea on exertion, +++ edema, +++ orthopnea, palpitations, paroxysmal nocturnal dyspnea. Dermatological: No rash, lesions/masses Respiratory: No cough, +++ dyspnea Urologic: No hematuria, dysuria Abdominal:   No nausea, vomiting, diarrhea, bright red blood per rectum, melena, or hematemesis Neurologic:  No  visual changes, wkns, changes in mental status. All other systems reviewed and are otherwise negative except as noted above.  Physical Exam  Blood pressure 172/120, pulse 90, temperature 97.1 F (36.2 C), resp. rate 20, height 5\' 10"  (1.778 m), weight 215 lb 2.7 oz (97.6 kg), SpO2 100.00%.  General: Pleasant, NAD  Psych: Normal affect. Neuro: Alert and oriented X 3. Moves all extremities spontaneously. HEENT: Normal  Neck: Supple without bruits.  JVP to jaw. Lungs:  Resp regular and unlabored, diminished at bases with bibasilar crackles. Heart: RRR no s3, s4, or murmurs. Abdomen: Soft, non-tender, non-distended, BS + x 4.  Extremities: No clubbing, cyanosis, 2+ bilat LE edema. DP/PT/Radials 2+ and equal bilaterally.  Labs  Troponin [facility] of Care Test)  Recent Labs  08/24/13 0502  TROPIPOC 0.09*  Lab Results  Component Value Date   WBC 11.8* 08/24/2013   HGB 12.5* 08/24/2013   HCT 40.0 08/24/2013   MCV 87.0 08/24/2013   PLT 270 08/24/2013    Recent Labs Lab 08/24/13 0400  NA 137  K 3.8  CL 100  CO2 27  BUN 26*  CREATININE 2.27*  CALCIUM 8.7  PROT 7.1  BILITOT 1.8*  ALKPHOS 57  ALT 9  AST 16  GLUCOSE 142*   Radiology/Studies  Dg Chest 2 View  08/24/2013   CLINICAL DATA:  Shortness of breath and cough.  EXAM: CHEST  2 VIEW  COMPARISON:  Chest radiograph performed 07/24/2013  FINDINGS: The lungs are well-aerated. Vascular congestion is noted, with mild right-sided airspace opacities, possibly reflecting mild asymmetric interstitial edema. No definite pleural effusion or pneumothorax is seen.  The heart is significantly enlarged, similar in appearance to prior studies. No acute osseous abnormalities are seen.  IMPRESSION: Vascular congestion and significant cardiomegaly, with mild right-sided airspace opacities, possibly reflecting mild asymmetric pulmonary edema.   Electronically Signed   By: Roanna Raider M.D.   On: 08/24/2013 05:55   ECG  Rsr, 100, poor r  prog, lad, lafb - no acute changes.  ASSESSMENT AND PLAN  1.  Acute on chronic systolic congestive heart failure/mixed ischemic and nonischemic cardiomyopathy: Patient presents with marked volume overload, dyspnea, and edema. He says that symptoms started yesterday. He does not weigh himself at home. He reports compliance with his medications and diet. He has had issues with noncompliance with his antihypertensives in the past. He was just discharged under a month ago and felt a followup for his transition of care appointment one week later. We will resume his home antihypertensives including beta blocker, hydralazine, nitrate, and diuresis with Lasix 80 mg twice a day. He is not on ACE inhibitor or ARB secondary to stage III chronic kidney disease. We will try and arrange followup in heart failure clinic.  2. Hypertensive urgency: Questionable compliance with antihypertensives at home. Resume home doses of antihypertensives and diuresis as above. Transition from IV nitroglycerin to oral nitrates as blood pressure improves.  3. Elevated troponin/CAD: Point-of-care troponin is mildly elevated. This is been the case in setting of hypertensive urgency heart failure with him in the past. We will follow. Discontinue heparin for now. Continue aspirin, beta blocker, nitrates, and Ranexa.  4. Hyperlipidemia: Continue statin therapy.  5. Noncompliance: This is been an issue in the past. Patient currently reports compliance with all medications. In the past, simply by resuming his home doses of antihypertensives and blood pressures have been adequately controlled. Assess response on this admission.  6.  Stage III CKD:  Creat stable at present.  Follow with diuresis.  He sees nephrology as an outpt.  No ACEI/ARB.  Signed, Nicolasa Ducking, NP 08/24/2013, 6:46 AM  As above, patient seen and examined. Briefly he is a 47 year old male with past medical history of mixed ischemic/nonischemic cardiomyopathy,  coronary artery disease, hypertension, hyperlipidemia, chronic renal insufficiency who presents with congestive heart failure. Patient states that he has been compliant with his medications. He complains of one day of dyspnea, lower extremity edema and upper abdominal tightness. He also describes chills and a productive cough. Blood pressure is markedly elevated. He has 2+ pedal edema. BUN and creatinine 26/2.27. BNP greater than 70,000. Troponin 0.09. Plan to admit for IV diuresis. Follow renal function closely. Will treat with aspirin, carvedilol and continue hydralazine nitrates. He is not on an ACE inhibitor because of  renal insufficiency. Some of his issue is most likely noncompliance. Follow blood pressure closely and increase medications as needed. I would favor followup in the CHF clinic after discharge. Patient instructed on the importance of compliance with medications and low-sodium diet. Olga Millers

## 2013-08-24 NOTE — ED Notes (Signed)
Pt asleep upon entering the room, sts he is very thirsty. Denies CP at this moment. sts he is having some back pain. Nad, skin warm and dry, resp e/u.

## 2013-08-24 NOTE — ED Notes (Signed)
Ordered pt heart healthy food tray.

## 2013-08-24 NOTE — ED Notes (Signed)
Attempted to give report. RN unavailable at this moment. Gave my name and number to call back when they are.

## 2013-08-24 NOTE — ED Notes (Signed)
Per EMS: pt started having shortness of breath and congestion starting yesterday. Pt took own nitro x 3 and asa today. Pt c/o chest pain. Pain is reproducable, non radiating. Pt normal sinus with occassional PVC. Pt is A&Ox4, respirations equal and unlabored, skin warm and dry.

## 2013-08-25 DIAGNOSIS — I509 Heart failure, unspecified: Secondary | ICD-10-CM

## 2013-08-25 DIAGNOSIS — I10 Essential (primary) hypertension: Secondary | ICD-10-CM

## 2013-08-25 DIAGNOSIS — I5023 Acute on chronic systolic (congestive) heart failure: Principal | ICD-10-CM

## 2013-08-25 LAB — BASIC METABOLIC PANEL
BUN: 33 mg/dL — ABNORMAL HIGH (ref 6–23)
CO2: 33 mEq/L — ABNORMAL HIGH (ref 19–32)
Calcium: 7.9 mg/dL — ABNORMAL LOW (ref 8.4–10.5)
Chloride: 97 mEq/L (ref 96–112)
Creatinine, Ser: 2.5 mg/dL — ABNORMAL HIGH (ref 0.50–1.35)
Glucose, Bld: 126 mg/dL — ABNORMAL HIGH (ref 70–99)

## 2013-08-25 MED ORDER — POTASSIUM CHLORIDE CRYS ER 20 MEQ PO TBCR
40.0000 meq | EXTENDED_RELEASE_TABLET | Freq: Once | ORAL | Status: AC
  Administered 2013-08-25: 40 meq via ORAL
  Filled 2013-08-25: qty 2

## 2013-08-25 MED ORDER — FUROSEMIDE 10 MG/ML IJ SOLN
40.0000 mg | Freq: Two times a day (BID) | INTRAMUSCULAR | Status: DC
  Administered 2013-08-25 – 2013-08-26 (×4): 40 mg via INTRAVENOUS
  Filled 2013-08-25 (×5): qty 4

## 2013-08-25 MED ORDER — ISOSORBIDE MONONITRATE ER 30 MG PO TB24
30.0000 mg | ORAL_TABLET | Freq: Every day | ORAL | Status: DC
  Administered 2013-08-25 – 2013-08-27 (×3): 30 mg via ORAL
  Filled 2013-08-25 (×3): qty 1

## 2013-08-25 NOTE — Care Management Note (Signed)
    Page 1 of 1   08/25/2013     2:16:32 PM   CARE MANAGEMENT NOTE 08/25/2013  Patient:  Donahue,Heyward   Account Number:  [id number]  Date Initiated:  08/24/2013  Documentation initiated by:  Keirra Zeimet  Subjective/Objective Assessment:   adm wiith dx of systolic failure and HTN urgency; has MCD PCS through [facility] 4 hrs/day    PCP  Dr Joette Catching     DC Planning Services  CM consult      Choice offered to / List presented to:  C-1 Patient      HH arranged  HH-1 RN  HH-10 DISEASE MANAGEMENT      HH agency  [facility]   Per UR Regulation:  Reviewed for med. necessity/level of care/duration of stay  Comments:  08/25/13 1142 Jesilyn Easom RN MSN BSN CCM Received referral for heart failure home health screen plus noted h/o noncompliance with meds and f/u.  Discussed with pt and he agrees to home health RN.  Provided list of [facility]; pt preference is [place] but they do not staff intermittent visits in [place]. Pt has no preference otherwise - is agreeable to referral to [facility].

## 2013-08-25 NOTE — Clinical Documentation Improvement (Signed)
THIS DOCUMENT IS NOT A PERMANENT PART OF THE MEDICAL RECORD  Please update your documentation with the medical record to reflect your response to this query. If you need help knowing how to do this please call [phone].  08/25/13  Dear Dr. Jens Som,  Patient admitted with Acute/chronic systolic CHF. Chart Notes state multiple times "hypertensive urgency". In the Coding world this equates to "simple benign hypertension". Please clarify this diagnosis to illustrate this patient's severity of illness and risk of mortality. Thank you.  Possible Clinical Conditions?  - Hypertension - Accelerated Hypertension - Malignant Hypertension - Or Other Condition (please specify)   You may use possible, probable, or suspect with inpatient documentation. Possible, probable, suspected diagnoses MUST be documented at the time of discharge.  Reviewed: additional documentation in the medical record  Thank Bonita Quin  Beverley Fiedler  Clinical Documentation Specialist: [phone] Health Information Management [place]

## 2013-08-25 NOTE — Progress Notes (Addendum)
    Subjective:  Denies CP; dyspnea mildly improved.   Objective:  Filed Vitals:   08/24/13 2000 08/24/13 2231 08/25/13 0000 08/25/13 0400  BP: 141/98 131/88 121/77 136/87  Pulse: 59  57 55  Temp: 97.6 F (36.4 C)  97.4 F (36.3 C) 97.7 F (36.5 C)  TempSrc: Oral  Oral Oral  Resp: 20  22 26   Height:      Weight:      SpO2: 99%  100% 99%    Intake/Output from previous day:  Intake/Output Summary (Last 24 hours) at 08/25/13 0719 Last data filed at 08/25/13 0600  Gross per 24 hour  Intake    609 ml  Output   5150 ml  Net  -4541 ml    Physical Exam: Physical exam: Well-developed well-nourished in no acute distress.  Skin is warm and dry.  HEENT is normal.  Neck is supple.  Chest with diminished BS bases Cardiovascular exam is regular rate and rhythm.  Abdominal exam nontender or distended. No masses palpated. Extremities show trace to 1+ edema. neuro grossly intact    Lab Results: Basic Metabolic Panel:  Recent Labs  16/10/96 0400 08/24/13 1305 08/25/13 0408  NA 137  --  140  K 3.8  --  3.3*  CL 100  --  97  CO2 27  --  33*  GLUCOSE 142*  --  126*  BUN 26*  --  33*  CREATININE 2.27* 2.29* 2.50*  CALCIUM 8.7  --  7.9*   CBC:  Recent Labs  08/24/13 0400 08/24/13 1305  WBC 11.8* 8.1  NEUTROABS 9.5*  --   HGB 12.5* 11.4*  HCT 40.0 36.7*  MCV 87.0 87.8  PLT 270 214   Cardiac Enzymes:  Recent Labs  08/24/13 1305 08/24/13 1635 08/24/13 2242  TROPONINI <0.30 <0.30 <0.30     Assessment/Plan:  1. Acute on chronic systolic congestive heart failure/mixed ischemic and nonischemic cardiomyopathy: Patient improving this AM. He has had issues with noncompliance with his antihypertensives in the past. Discussed importance of compliance. We will continue his home antihypertensives including beta blocker, hydralazine, nitrate (change IV NTG to imdur); change lasix to 40 BID and follow renal function. He is not on ACE inhibitor or ARB secondary to stage  III chronic kidney disease. We will try and arrange followup in heart failure clinic.  2. Malignant hypertension: Questionable compliance with antihypertensives at home. BP improved with meds. 3. Elevated troponin/CAD: Point-of-care troponin is mildly elevated. FU troponin neg. No further cardiac eval. 4. Hyperlipidemia: Continue statin therapy.  5. Noncompliance: As above. 6. Stage III CKD: Creat mildly increased; change lasix to 40 BID. Follow with diuresis. He sees nephrology as an outpt. No ACEI/ARB.   Olga Millers 08/25/2013, 7:19 AM

## 2013-08-26 DIAGNOSIS — I5043 Acute on chronic combined systolic (congestive) and diastolic (congestive) heart failure: Secondary | ICD-10-CM

## 2013-08-26 LAB — BASIC METABOLIC PANEL
BUN: 28 mg/dL — ABNORMAL HIGH (ref 6–23)
CO2: 32 mEq/L (ref 19–32)
Calcium: 8.6 mg/dL (ref 8.4–10.5)
Chloride: 97 mEq/L (ref 96–112)
GFR calc non Af Amer: 30 mL/min — ABNORMAL LOW (ref >90)
Glucose, Bld: 92 mg/dL (ref 70–99)
Potassium: 3.9 mEq/L (ref 3.5–5.1)
Sodium: 138 mEq/L (ref 135–145)

## 2013-08-26 LAB — CBC
Hemoglobin: 12.1 g/dL — ABNORMAL LOW (ref 13.0–17.0)
MCH: 27.6 pg (ref 26.0–34.0)
MCHC: 31.6 g/dL (ref 30.0–36.0)
MCV: 87.4 fL (ref 78.0–100.0)
RBC: 4.38 MIL/uL (ref 4.22–5.81)
WBC: 7.6 10*3/uL (ref 4.0–10.5)

## 2013-08-26 MED ORDER — HYDRALAZINE HCL 50 MG PO TABS
50.0000 mg | ORAL_TABLET | Freq: Three times a day (TID) | ORAL | Status: DC
Start: 2013-08-26 — End: 2013-08-27
  Administered 2013-08-26 – 2013-08-27 (×3): 50 mg via ORAL
  Filled 2013-08-26 (×5): qty 1

## 2013-08-26 NOTE — Progress Notes (Signed)
    Subjective:  Denies CP; dyspnea improving   Objective:  Filed Vitals:   08/25/13 2149 08/26/13 0017 08/26/13 0428 08/26/13 0816  BP: 126/95 148/96 104/69 154/97  Pulse:   59 73  Temp:  98 F (36.7 C) 98 F (36.7 C) 97.8 F (36.6 C)  TempSrc:  Oral Oral Oral  Resp:   15 21  Height:      Weight:   228 lb 13.4 oz (103.8 kg)   SpO2:  100% 100% 99%    Intake/Output from previous day:  Intake/Output Summary (Last 24 hours) at 08/26/13 0933 Last data filed at 08/26/13 0600  Gross per 24 hour  Intake    483 ml  Output   2950 ml  Net  -2467 ml    Physical Exam: Physical exam: Well-developed well-nourished in no acute distress.  Skin is warm and dry.  HEENT is normal.  Neck is supple.  Chest CTA Cardiovascular exam is regular rate and rhythm.  Abdominal exam nontender or distended. No masses palpated. Extremities show trace to trace edema. neuro grossly intact    Lab Results: Basic Metabolic Panel:  Recent Labs  16/10/96 0408 08/26/13 0536  NA 140 138  K 3.3* 3.9  CL 97 97  CO2 33* 32  GLUCOSE 126* 92  BUN 33* 28*  CREATININE 2.50* 2.43*  CALCIUM 7.9* 8.6   CBC:  Recent Labs  08/24/13 0400 08/24/13 1305 08/26/13 0536  WBC 11.8* 8.1 7.6  NEUTROABS 9.5*  --   --   HGB 12.5* 11.4* 12.1*  HCT 40.0 36.7* 38.3*  MCV 87.0 87.8 87.4  PLT 270 214 234   Cardiac Enzymes:  Recent Labs  08/24/13 1305 08/24/13 1635 08/24/13 2242  TROPONINI <0.30 <0.30 <0.30     Assessment/Plan:  1. Acute on chronic systolic congestive heart failure/mixed ischemic and nonischemic cardiomyopathy: Patient continues to improve this AM. He has had issues with noncompliance with his antihypertensives in the past. Discussed importance of compliance. We will continue his home antihypertensives including beta blocker, hydralazine, nitrates; continue lasix 40 BID and follow renal function. He is not on ACE inhibitor or ARB secondary to stage III chronic kidney disease. We  will try and arrange followup in heart failure clinic.  2. Malignant hypertension: Questionable compliance with antihypertensives at home. BP remains mildly elevated; change hydralazine to 50 mg TID. 3. Elevated troponin/CAD: Point-of-care troponin is mildly elevated. FU troponin neg. No further cardiac eval. 4. Hyperlipidemia: Continue statin therapy.  5. Noncompliance: As above. 6. Stage III CKD: Follow renal function with diuresis. He sees nephrology as an outpt. No ACEI/ARB.   Olga Millers 08/26/2013, 9:33 AM

## 2013-08-27 LAB — BASIC METABOLIC PANEL
CO2: 35 mEq/L — ABNORMAL HIGH (ref 19–32)
Calcium: 8.7 mg/dL (ref 8.4–10.5)
Creatinine, Ser: 2.41 mg/dL — ABNORMAL HIGH (ref 0.50–1.35)
GFR calc non Af Amer: 31 mL/min — ABNORMAL LOW (ref >90)
Glucose, Bld: 90 mg/dL (ref 70–99)
Sodium: 138 mEq/L (ref 135–145)

## 2013-08-27 MED ORDER — FUROSEMIDE 40 MG PO TABS
40.0000 mg | ORAL_TABLET | Freq: Every day | ORAL | Status: DC
  Administered 2013-08-27: 40 mg via ORAL
  Filled 2013-08-27: qty 1

## 2013-08-27 MED ORDER — FUROSEMIDE 40 MG PO TABS: 40.0000 mg | ORAL_TABLET | Freq: Every day | ORAL | Status: DC

## 2013-08-27 MED ORDER — ISOSORBIDE MONONITRATE ER 30 MG PO TB24: 30.0000 mg | ORAL_TABLET | Freq: Every day | ORAL | Status: DC

## 2013-08-27 NOTE — Discharge Summary (Signed)
Physician Discharge Summary  Patient ID: [name] MRN: [id number] DOB/AGE: 05/04/1966 47 y.o.  Admit date: 08/24/2013 Discharge date: 08/27/2013  Primary Discharge Diagnosis 1.Acute on Chronic Systolic CHF 2. Malignant Hypertension  Secondary Discharge Diagnosis 1. Hyperlipidemia 2. Medical Noncompliance 3. 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 He did not followup for post hospitalization appointment on most recent admission in September of 2014. On admission the patient was complaining of shortness of breath, admitted to not taking his medications, and found to have a blood pressure 172/120. The patient is not a candidate for an ACE inhibitor secondary to renal insufficiency. He is admitted for IV diuresis, and blood pressure control. This restarted on medication she should have been taking at home to include beta blocker, hydralazine, isosorbide. Weight on admission 229 pounds, on discharge 222 pounds. Lasix was increased to 40 mg daily.  The patient was advised to weigh himself daily, and take an additional 40 mg of Lasix on days where he has gained between 2-5 pounds within 24-48 hours. The patient was seen and examined by Dr. Olga Millers on day of discharge and found to be stable. Blood pressure was 120/89 with a heart rate of 63. He is to have a BMET drawn on Monday December 29th, and see Dr. Gordo Lions within 2 weeks post discharge.   Discharge Exam: Blood pressure 128/89, pulse 63, temperature 98.1 F (36.7 C), temperature source Oral, resp. rate 15, height 5\' 10"  (1.778 m), weight 222 lb 10.6 oz (101 kg), SpO2 97.00%.  Labs:   Lab Results  Component Value Date   WBC  7.6 08/26/2013   HGB 12.1* 08/26/2013   HCT 38.3* 08/26/2013   MCV 87.4 08/26/2013   PLT 234 08/26/2013    Recent Labs Lab 08/24/13 0400  08/27/13 0406  NA 137  < > 138  K 3.8  < > 4.1  CL 100  < > 95*  CO2 27  < > 35*  BUN 26*  < > 29*  CREATININE 2.27*  < > 2.41*  CALCIUM 8.7  < > 8.7  PROT 7.1  --   --   BILITOT 1.8*  --   --   ALKPHOS 57  --   --   ALT 9  --   --   AST 16  --   --   GLUCOSE 142*  < > 90  < > = values in this interval not displayed. Lab Results  Component Value Date   CKTOTAL 125 03/04/2012   CKMB 1.8 03/04/2012   TROPONINI <0.30 08/24/2013    Lab Results  Component Value Date   CHOL  Value: 121        ATP III CLASSIFICATION:  <200     mg/dL   Desirable  409-811  mg/dL   Borderline High  >=914    mg/dL   High        7/82/9562   CHOL  Value: 156        ATP III CLASSIFICATION:  <200     mg/dL   Desirable  130-865  mg/dL   Borderline High  >=784    mg/dL   High        02/09/6294   CHOL  Value:  128        ATP III CLASSIFICATION:  <200     mg/dL   Desirable  161-096  mg/dL   Borderline High  >=045    mg/dL   High        12/10/8117   Lab Results  Component Value Date   HDL 31* 02/22/2010   HDL 34* 05/10/2009   HDL 25* 04/26/2009   Lab Results  Component Value Date   LDLCALC  Value: 78        Total Cholesterol/HDL:CHD Risk Coronary Heart Disease Risk Table                     Men   Women  1/2 Average Risk   3.4   3.3  Average Risk       5.0   4.4  2 X Average Risk   9.6   7.1  3 X Average Risk  23.4   11.0        Use the calculated Patient Ratio above and the CHD Risk Table to determine the patient's CHD Risk.        ATP III CLASSIFICATION (LDL):  <100     mg/dL   Optimal  147-829  mg/dL   Near or Above                    Optimal  130-159  mg/dL   Borderline  562-130  mg/dL   High  >865     mg/dL   Very High 7/84/6962   LDLCALC  Value: 106        Total Cholesterol/HDL:CHD Risk Coronary Heart Disease Risk Table                     Men   Women  1/2 Average Risk   3.4    3.3  Average Risk       5.0   4.4  2 X Average Risk   9.6   7.1  3 X Average Risk  23.4   11.0        Use the calculated Patient Ratio above and the CHD Risk Table to determine the patient's CHD Risk.        ATP III CLASSIFICATION (LDL):  <100     mg/dL   Optimal  952-841  mg/dL   Near or Above                    Optimal  130-159  mg/dL   Borderline  324-401  mg/dL   High  >027     mg/dL   Very High* 10/08/3662   LDLCALC  Value: 91        Total Cholesterol/HDL:CHD Risk Coronary Heart Disease Risk Table                     Men   Women  1/2 Average Risk   3.4   3.3  Average Risk       5.0   4.4  2 X Average Risk   9.6   7.1  3 X Average Risk  23.4   11.0        Use the calculated Patient Ratio above and the CHD Risk Table to determine the patient's CHD Risk.        ATP III CLASSIFICATION (LDL):  <100     mg/dL   Optimal  403-474  mg/dL   Near or Above  Optimal  130-159  mg/dL   Borderline  161-096  mg/dL   High  >045     mg/dL   Very High 12/10/8117   Lab Results  Component Value Date   TRIG 60 02/22/2010   TRIG 82 05/10/2009   TRIG 60 04/26/2009   Lab Results  Component Value Date   CHOLHDL 3.9 02/22/2010   CHOLHDL 4.6 05/10/2009   CHOLHDL 5.1 04/26/2009   No results found for this basename: LDLDIRECT      Radiology: Dg Chest 2 View  08/24/2013   CLINICAL DATA:  Shortness of breath and cough.  EXAM: CHEST  2 VIEW  COMPARISON:  Chest radiograph performed 07/24/2013  FINDINGS: The lungs are well-aerated. Vascular congestion is noted, with mild right-sided airspace opacities, possibly reflecting mild asymmetric interstitial edema. No definite pleural effusion or pneumothorax is seen.  The heart is significantly enlarged, similar in appearance to prior studies. No acute osseous abnormalities are seen.  IMPRESSION: Vascular congestion and significant cardiomegaly, with mild right-sided airspace opacities, possibly reflecting mild asymmetric pulmonary edema.   Electronically Signed   By: Roanna Raider M.D.   On: 08/24/2013 05:55     FOLLOW UP PLANS AND APPOINTMENTS     Discharge Orders   Future Orders Complete By Expires   Contraindication to ACEI at discharge  As directed    Contraindication to ARB at discharge  As directed    Diet - low sodium heart healthy  As directed    Diet - low sodium heart healthy  As directed    Heart Failure patients record your daily weight using the same scale at the same time of day  As directed    Increase activity slowly  As directed    Increase activity slowly  As directed        Medication List         aspirin 325 MG EC tablet  Take 325 mg by mouth daily.     carvedilol 25 MG tablet  Commonly known as:  COREG  Take 1 tablet (25 mg total) by mouth 2 (two) times daily with a meal.     cholecalciferol 1000 UNITS tablet  Commonly known as:  VITAMIN D  Take 1,000 Units by mouth daily.     furosemide 40 MG tablet  Commonly known as:  LASIX  Take 1 tablet (40 mg total) by mouth daily.     hydrALAZINE 25 MG tablet  Commonly known as:  APRESOLINE  Take 1 tablet (25 mg total) by mouth 3 (three) times daily.     isosorbide mononitrate 30 MG 24 hr tablet  Commonly known as:  IMDUR  Take 1 tablet (30 mg total) by mouth daily.     nitroGLYCERIN 0.4 MG SL tablet  Commonly known as:  NITROSTAT  Place 0.4 mg under the tongue every 5 (five) minutes as needed for chest pain.     Potassium Chloride ER 20 MEQ Tbcr  Take 20 mEq by mouth daily.     pravastatin 40 MG tablet  Commonly known as:  PRAVACHOL  Take 40 mg by mouth at bedtime.     PROAIR HFA 108 (90 BASE) MCG/ACT inhaler  Generic drug:  albuterol  Inhale 1 puff into the lungs every 4 (four) hours as needed.     ranolazine 500 MG 12 hr tablet  Commonly known as:  RANEXA  Take 1 tablet (500 mg total) by mouth 2 (two) times daily.  Follow-up Information   Follow up with Rollene Rotunda, MD. (Our office will call you for appt.)    Specialty:  Cardiology   Contact  information:   [address] [place] [address] [phone]       Follow up with [facility] [place] [facility]) On 08/31/2013. (Labs to be drawn-BMET)    Specialty:  Cardiology   Contact information:   [address], [address] [place] [address] [phone]        Time spent with patient to include physician time:30 minutes Signed: Joni Reining 08/27/2013, 7:29 AM Co-Sign MD

## 2013-08-27 NOTE — Progress Notes (Signed)
Pt prepared for d/c home. P.A. willl phone perscription for Potassium to local pharmacy. Pt educated on current meds and encouraged to remain compliant.

## 2013-08-27 NOTE — Progress Notes (Signed)
    Subjective:  Denies CP; dyspnea resolved   Objective:  Filed Vitals:   08/26/13 1932 08/26/13 2113 08/26/13 2335 08/27/13 0345  BP: 134/87 140/98 142/101 128/89  Pulse: 67  68 63  Temp: 97.9 F (36.6 C)  98.8 F (37.1 C) 98.1 F (36.7 C)  TempSrc: Oral  Oral Oral  Resp: 17  19 15   Height:      Weight:    222 lb 10.6 oz (101 kg)  SpO2: 97%  99% 97%    Intake/Output from previous day:  Intake/Output Summary (Last 24 hours) at 08/27/13 7846 Last data filed at 08/27/13 0347  Gross per 24 hour  Intake    960 ml  Output   3500 ml  Net  -2540 ml    Physical Exam: Physical exam: Well-developed well-nourished in no acute distress.  Skin is warm and dry.  HEENT is normal.  Neck is supple.  Chest CTA Cardiovascular exam is regular rate and rhythm.  Abdominal exam nontender or distended. No masses palpated. Extremities show no edema. neuro grossly intact    Lab Results: Basic Metabolic Panel:  Recent Labs  96/29/52 0536 08/27/13 0406  NA 138 138  K 3.9 4.1  CL 97 95*  CO2 32 35*  GLUCOSE 92 90  BUN 28* 29*  CREATININE 2.43* 2.41*  CALCIUM 8.6 8.7   CBC:  Recent Labs  08/24/13 1305 08/26/13 0536  WBC 8.1 7.6  HGB 11.4* 12.1*  HCT 36.7* 38.3*  MCV 87.8 87.4  PLT 214 234   Cardiac Enzymes:  Recent Labs  08/24/13 1305 08/24/13 1635 08/24/13 2242  TROPONINI <0.30 <0.30 <0.30     Assessment/Plan:  1. Acute on chronic systolic congestive heart failure/mixed ischemic and nonischemic cardiomyopathy: Patient improved. He has had issues with noncompliance with his antihypertensives in the past. Discussed importance of compliance. We will continue his home antihypertensives including beta blocker, hydralazine, nitrates; change lasix to 40 mg daily with additional 40 mg daily as needed for weight gain of 2 lbs; check BMET Monday with results to Dr Antoine Poche; low NA diet. He is not on ACE inhibitor or ARB secondary to stage III chronic kidney disease. We  will try and arrange followup in heart failure clinic after he sees Dr Antoine Poche; patient with transportation issues.  2. Malignant hypertension: Questionable compliance with antihypertensives at home. BP improved; continue present meds. 3. Elevated troponin/CAD: Point-of-care troponin is mildly elevated. FU troponin neg. No further cardiac eval. 4. Hyperlipidemia: Continue statin therapy.  5. Noncompliance: As above. 6. Stage III CKD: Follow renal function with diuresis. He sees nephrology as an outpt. No ACEI/ARB. > 30 min PA and physician time D2   [name] 08/27/2013, 6:33 AM

## 2013-08-28 ENCOUNTER — Telehealth: Payer: Self-pay | Admitting: Cardiology

## 2013-08-28 ENCOUNTER — Other Ambulatory Visit: Payer: Self-pay

## 2013-08-28 ENCOUNTER — Telehealth: Payer: Self-pay | Admitting: *Deleted

## 2013-08-28 MED ORDER — NITROGLYCERIN 0.4 MG SL SUBL: 0.4000 mg | SUBLINGUAL_TABLET | SUBLINGUAL | Status: DC | PRN

## 2013-08-28 NOTE — Telephone Encounter (Signed)
Follow up:   Per discharge note; P.A. willl phone perscription for Potassium to local pharmacy.  [facility] [place] / And. Proair HFA 108 as well     Pt called to say not there please call it in.  If any questions please give pt a call.

## 2013-08-28 NOTE — Telephone Encounter (Signed)
QUESTIONS REGUARDING RX'S AND PROAIR. ALSO TOLD HIM THAT [facility] WOULD HAVE TO FILL HIS PROAIR, DR.[facility] WAS THE ORIGINAL PRE SCRIBER.

## 2013-08-28 NOTE — Discharge Summary (Signed)
See progress notes [name]  

## 2013-08-31 NOTE — Telephone Encounter (Signed)
Spoke with [facility] nurse - she will go back out tomorrow to draw the BMP and double check pt's medication list that he has them all and is taking them as RXed.

## 2013-08-31 NOTE — Telephone Encounter (Signed)
Left message to call back.  D/c summary states BMP due 12/29.  BP was under control at the time of d/c from hospital.  Need to make sure pt has all medications and is taking them as RXed.

## 2013-08-31 NOTE — Telephone Encounter (Signed)
New problem    Admission done today    1. Lab work is schedule. Can they obtain if so,  what time of labs need to be drawn    2. B/p  today 180/110 sitting standing  180/120. Patient states this is normal.

## 2013-09-01 ENCOUNTER — Encounter: Payer: Self-pay | Admitting: Cardiology

## 2013-09-11 ENCOUNTER — Telehealth: Payer: Self-pay | Admitting: Cardiology

## 2013-09-11 NOTE — Telephone Encounter (Signed)
New message     bp this am was 180/120---170/130 on jan 7th

## 2013-09-11 NOTE — Telephone Encounter (Signed)
Per [facility] nurse - pt has not taken his BP medications prior to these BP readings.  Dr Antoine Poche aware of results.  They will continue to follow.  [facility] did ask the pt to make sure he is taking his medications as ordered and to take them prior to taking BP.

## 2013-10-13 ENCOUNTER — Telehealth: Payer: Self-pay | Admitting: *Deleted

## 2013-10-13 ENCOUNTER — Other Ambulatory Visit: Payer: Self-pay

## 2013-10-13 MED ORDER — PRAVASTATIN SODIUM 40 MG PO TABS: 40.0000 mg | ORAL_TABLET | Freq: Every day | ORAL | Status: DC

## 2013-10-13 NOTE — Telephone Encounter (Signed)
Called in to inquire about request for Rx refill on Pravastatin. Confirmed that Rx refill was completed today 2/10 @9 :47 am with E-confirmation from pharmacy with 6 month of refills.

## 2013-10-21 ENCOUNTER — Ambulatory Visit: Payer: Medicare Other | Admitting: Cardiology

## 2013-10-21 ENCOUNTER — Other Ambulatory Visit: Payer: Self-pay

## 2013-10-21 MED ORDER — RANOLAZINE ER 500 MG PO TB12: 500.0000 mg | ORAL_TABLET | Freq: Two times a day (BID) | ORAL | Status: DC

## 2013-11-09 ENCOUNTER — Telehealth: Payer: Self-pay | Admitting: Cardiology

## 2013-11-09 NOTE — Telephone Encounter (Signed)
New message   [name] with [facility] called. She states that she will need additional information added to a home face to face form. The form was completed for an encounter on 08/24/13 by Dr. Antoine Poche .[facility] Kitchen Will need clinical findings added to the form to explain why the skilled nursing was needed. Please call back to discuss.

## 2013-11-10 NOTE — Telephone Encounter (Signed)
Left message for [name] to fax form back to be reviewed

## 2013-11-12 ENCOUNTER — Other Ambulatory Visit (HOSPITAL_COMMUNITY): Payer: Self-pay | Admitting: Adult Health Nurse Practitioner

## 2013-11-12 ENCOUNTER — Ambulatory Visit (HOSPITAL_COMMUNITY): Payer: Medicare Other | Attending: Adult Health Nurse Practitioner

## 2013-11-12 DIAGNOSIS — R609 Edema, unspecified: Secondary | ICD-10-CM

## 2013-11-13 ENCOUNTER — Other Ambulatory Visit (HOSPITAL_COMMUNITY): Payer: Self-pay | Admitting: Adult Health Nurse Practitioner

## 2013-11-13 ENCOUNTER — Ambulatory Visit (HOSPITAL_COMMUNITY)
Admission: RE | Admit: 2013-11-13 | Discharge: 2013-11-13 | Disposition: A | Discharge status: Home / Self Care | Payer: Medicare Other | Source: Ambulatory Visit | Attending: Adult Health Nurse Practitioner | Admitting: Adult Health Nurse Practitioner

## 2013-11-13 DIAGNOSIS — R609 Edema, unspecified: Secondary | ICD-10-CM

## 2013-11-13 DIAGNOSIS — M79609 Pain in unspecified limb: Secondary | ICD-10-CM | POA: Insufficient documentation

## 2013-11-17 ENCOUNTER — Encounter (HOSPITAL_COMMUNITY): Payer: Self-pay | Admitting: Emergency Medicine

## 2013-11-17 ENCOUNTER — Emergency Department (HOSPITAL_COMMUNITY)
Admission: EM | Admit: 2013-11-17 | Discharge: 2013-11-17 | Disposition: A | Discharge status: Home / Self Care | Payer: Medicare Other | Attending: Emergency Medicine | Admitting: Emergency Medicine

## 2013-11-17 ENCOUNTER — Emergency Department (HOSPITAL_COMMUNITY): Payer: Medicare Other

## 2013-11-17 DIAGNOSIS — I509 Heart failure, unspecified: Secondary | ICD-10-CM | POA: Insufficient documentation

## 2013-11-17 DIAGNOSIS — M79601 Pain in right arm: Secondary | ICD-10-CM

## 2013-11-17 DIAGNOSIS — E785 Hyperlipidemia, unspecified: Secondary | ICD-10-CM | POA: Insufficient documentation

## 2013-11-17 DIAGNOSIS — M79609 Pain in unspecified limb: Secondary | ICD-10-CM | POA: Insufficient documentation

## 2013-11-17 DIAGNOSIS — Z79899 Other long term (current) drug therapy: Secondary | ICD-10-CM | POA: Insufficient documentation

## 2013-11-17 DIAGNOSIS — Z8701 Personal history of pneumonia (recurrent): Secondary | ICD-10-CM | POA: Insufficient documentation

## 2013-11-17 DIAGNOSIS — I2510 Atherosclerotic heart disease of native coronary artery without angina pectoris: Secondary | ICD-10-CM | POA: Insufficient documentation

## 2013-11-17 DIAGNOSIS — N189 Chronic kidney disease, unspecified: Secondary | ICD-10-CM | POA: Insufficient documentation

## 2013-11-17 DIAGNOSIS — Z7982 Long term (current) use of aspirin: Secondary | ICD-10-CM | POA: Insufficient documentation

## 2013-11-17 DIAGNOSIS — M7989 Other specified soft tissue disorders: Secondary | ICD-10-CM | POA: Insufficient documentation

## 2013-11-17 DIAGNOSIS — I129 Hypertensive chronic kidney disease with stage 1 through stage 4 chronic kidney disease, or unspecified chronic kidney disease: Secondary | ICD-10-CM | POA: Insufficient documentation

## 2013-11-17 DIAGNOSIS — Z9889 Other specified postprocedural states: Secondary | ICD-10-CM | POA: Insufficient documentation

## 2013-11-17 DIAGNOSIS — Z87891 Personal history of nicotine dependence: Secondary | ICD-10-CM | POA: Insufficient documentation

## 2013-11-17 DIAGNOSIS — Z862 Personal history of diseases of the blood and blood-forming organs and certain disorders involving the immune mechanism: Secondary | ICD-10-CM | POA: Insufficient documentation

## 2013-11-17 LAB — CBC
HEMATOCRIT: 39.6 % (ref 39.0–52.0)
HEMOGLOBIN: 12.6 g/dL — AB (ref 13.0–17.0)
MCH: 27.8 pg (ref 26.0–34.0)
MCHC: 31.8 g/dL (ref 30.0–36.0)
MCV: 87.4 fL (ref 78.0–100.0)
Platelets: 228 10*3/uL (ref 150–400)
RBC: 4.53 MIL/uL (ref 4.22–5.81)
RDW: 16.8 % — ABNORMAL HIGH (ref 11.5–15.5)
WBC: 7.8 10*3/uL (ref 4.0–10.5)

## 2013-11-17 LAB — I-STAT TROPONIN, ED: Troponin i, poc: 0.06 ng/mL (ref 0.00–0.08)

## 2013-11-17 LAB — BASIC METABOLIC PANEL
BUN: 35 mg/dL — AB (ref 6–23)
CHLORIDE: 100 meq/L (ref 96–112)
CO2: 24 mEq/L (ref 19–32)
Calcium: 8.7 mg/dL (ref 8.4–10.5)
Creatinine, Ser: 2.79 mg/dL — ABNORMAL HIGH (ref 0.50–1.35)
GFR calc Af Amer: 30 mL/min — ABNORMAL LOW (ref >90)
GFR calc non Af Amer: 26 mL/min — ABNORMAL LOW (ref >90)
GLUCOSE: 127 mg/dL — AB (ref 70–99)
POTASSIUM: 4.2 meq/L (ref 3.7–5.3)
Sodium: 140 mEq/L (ref 137–147)

## 2013-11-17 MED ORDER — FUROSEMIDE 40 MG PO TABS: 40.0000 mg | ORAL_TABLET | Freq: Two times a day (BID) | ORAL | Status: DC

## 2013-11-17 MED ORDER — NITROGLYCERIN 0.4 MG SL SUBL: 0.4000 mg | SUBLINGUAL_TABLET | SUBLINGUAL | Status: DC | PRN

## 2013-11-17 MED ORDER — HYDROCODONE-ACETAMINOPHEN 5-325 MG PO TABS: 1.0000 | ORAL_TABLET | ORAL | Status: DC | PRN

## 2013-11-17 MED ORDER — MORPHINE SULFATE 4 MG/ML IJ SOLN
6.0000 mg | Freq: Once | INTRAMUSCULAR | Status: AC
  Administered 2013-11-17: 6 mg via INTRAVENOUS
  Filled 2013-11-17: qty 2

## 2013-11-17 NOTE — ED Notes (Signed)
per EMS patient had increased swelling since Friday, but is c/o cp that began this am at 0430 accompanied by lightheadedness, diaphoresis, sob, denies nausea, weakness, changes in loc.  Patient has hx of cardiac disease, NSTEMI, HTN, CAD, kidney disease.   Given 4 ASA and 1 NTG PTA.  Pain decreased from 8/10 to a 6/10 after NTG administration.  Patient's pain currently 6/10, describes pain as sharp, generalized, and increases with inspiration

## 2013-11-17 NOTE — ED Notes (Addendum)
Patient understands not to drive and will call a ride

## 2013-11-17 NOTE — Discharge Instructions (Signed)
Edema Edema is an abnormal build-up of fluids in tissues. Because this is partly dependent on gravity (water flows to the lowest place), it is more common in the legs and thighs (lower extremities). It is also common in the looser tissues, like around the eyes. Painless swelling of the feet and ankles is common and increases as a person ages. It may affect both legs and may include the calves or even thighs. When squeezed, the fluid may move out of the affected area and may leave a dent for a few moments. CAUSES   Prolonged standing or sitting in one place for extended periods of time. Movement helps pump tissue fluid into the veins, and absence of movement prevents this, resulting in edema.  Varicose veins. The valves in the veins do not work as well as they should. This causes fluid to leak into the tissues.  Fluid and salt overload.  Injury, burn, or surgery to the leg, ankle, or foot, may damage veins and allow fluid to leak out.  Sunburn damages vessels. Leaky vessels allow fluid to go out into the sunburned tissues.  Allergies (from insect bites or stings, medications or chemicals) cause swelling by allowing vessels to become leaky.  Protein in the blood helps keep fluid in your vessels. Low protein, as in malnutrition, allows fluid to leak out.  Hormonal changes, including pregnancy and menstruation, cause fluid retention. This fluid may leak out of vessels and cause edema.  Medications that cause fluid retention. Examples are sex hormones, blood pressure medications, steroid treatment, or anti-depressants.  Some illnesses cause edema, especially heart failure, kidney disease, or liver disease.  Surgery that cuts veins or lymph nodes, such as surgery done for the heart or for breast cancer, may result in edema. DIAGNOSIS  Your caregiver is usually easily able to determine what is causing your swelling (edema) by simply asking what is wrong (getting a history) and examining you (doing  a physical). Sometimes x-rays, EKG (electrocardiogram or heart tracing), and blood work may be done to evaluate for underlying medical illness. TREATMENT  General treatment includes:  Leg elevation (or elevation of the affected body part).  Restriction of fluid intake.  Prevention of fluid overload.  Compression of the affected body part. Compression with elastic bandages or support stockings squeezes the tissues, preventing fluid from entering and forcing it back into the blood vessels.  Diuretics (also called water pills or fluid pills) pull fluid out of your body in the form of increased urination. These are effective in reducing the swelling, but can have side effects and must be used only under your caregiver's supervision. Diuretics are appropriate only for some types of edema. The specific treatment can be directed at any underlying causes discovered. Heart, liver, or kidney disease should be treated appropriately. HOME CARE INSTRUCTIONS   Elevate the legs (or affected body part) above the level of the heart, while lying down.  Avoid sitting or standing still for prolonged periods of time.  Avoid putting anything directly under the knees when lying down, and do not wear constricting clothing or garters on the upper legs.  Exercising the legs causes the fluid to work back into the veins and lymphatic channels. This may help the swelling go down.  The pressure applied by elastic bandages or support stockings can help reduce ankle swelling.  A low-salt diet may help reduce fluid retention and decrease the ankle swelling.  Take any medications exactly as prescribed. SEEK MEDICAL CARE IF:  Your edema is   not responding to recommended treatments. SEEK IMMEDIATE MEDICAL CARE IF:   You develop shortness of breath or chest pain.  You cannot breathe when you lay down; or if, while lying down, you have to get up and go to the window to get your breath.  You are having increasing  swelling without relief from treatment.  You develop a fever over 102 F (38.9 C).  You develop pain or redness in the areas that are swollen.  Tell your caregiver right away if you have gained 03 lb/1.4 kg in 1 day or 05 lb/2.3 kg in a week. MAKE SURE YOU:   Understand these instructions.  Will watch your condition.  Will get help right away if you are not doing well or get worse. Document Released: 08/20/2005 Document Revised: 02/19/2012 Document Reviewed: 04/07/2008 [employer] Patient Information 2014 [employer].  

## 2013-11-17 NOTE — ED Provider Notes (Signed)
CSN: [id number]     Arrival date & time 11/17/13  6440 History   First MD Initiated Contact with Patient 11/17/13 [phone]     Chief Complaint  Patient presents with  . Chest Pain      HPI Patient reports some increasing swelling in his lower extremities over the past several days.  He had some transient chest pain followed by more severe right arm pain that began at 4:30 in the morning.  His right arm pain is been constant.  He denies nausea and vomiting.  He does report transient shortness of breath earlier today.  He does have a history of coronary artery disease is currently undergoing medical management.  His last heart catheterization was in 2012 and medical treatment was recommended at that time.  He states compliance with his medication.  No history of DVT or pulmonary wasn't.  No shortness of breath at this time.  He denies significant orthopnea.  He feels like his leg swelling is improved over the past 24 hours.  He continues to take his Lasix as prescribed.  His pain in his right arm is worse with palpation and is focused to his right forearm.  He states the severity of his pain is worse from his right elbow down to his wrist.  No trauma or injury to this area.  No chest pain at this time   Past Medical History  Diagnosis Date  . CAD (coronary artery disease)     a. NSTEMI 2008: in setting of profound epistaxis/anemia, cath showing severe diffuse RCA dz with L-R collaterals, nonobst dz in left system, managed medically. b. Type II NSTEMI 10/2008. c. Last cath 2012 - for med rx. d. 05/2013: elevated troponin ? due to ARF.  e. Ranexa added 03/2012.  Marland Kitchen Hypertension     a. Difficult to control. b. Renal duplex neg for RAS 11/2012.  Marland Kitchen Hyperlipidemia   . Chronic kidney disease     a. Average creatine of about 2.3  . Cardiomyopathy     a. Mixed ICM/NICM - Prior EF 25% back to 2008. b. Improved to 55% in 2012. c. Subsequent echoes: 45% in 11/2010, 15% by echo 05/2013.  Marland Kitchen Anemia     a. 2008 in  setting of profound epistaxis.  Marland Kitchen History of pneumonia 08/2012, 05/2013  . Epistaxis     a. 2008, 2009 a/w significant anemia.  Marland Kitchen SVT (supraventricular tachycardia)     a. 05/2013 tx with 6mg  adenosine in ER.  . CHF (congestive heart failure)     EF 15%   Past Surgical History  Procedure Laterality Date  . Cardiac catheterization  2012    total RCA occlusion with left-to-right collaterals supplying the distal RCA branches, mild diffuse nonobstructive disease of LAD and LCx. Medically managed   Family History  Problem Relation Age of Onset  . Hypertension Mother     deceased  . Diabetes Mother     deceased  . Heart disease Mother     deceased  . Heart attack Father     at age 7, deceased   History  Substance Use Topics  . Smoking status: Former Smoker -- 1.00 packs/day for 10 years    Types: Cigarettes    Quit date: 11/16/2006  . Smokeless tobacco: Never Used  . Alcohol Use: No    Review of Systems  All other systems reviewed and are negative.      Allergies  Review of patient's allergies indicates no known allergies.  Home  Medications   Current Outpatient Rx  Name  Route  Sig  Dispense  Refill  . aspirin 325 MG EC tablet   Oral   Take 325 mg by mouth daily.         . carvedilol (COREG) 25 MG tablet   Oral   Take 50 mg by mouth 2 (two) times daily with a meal.         . cholecalciferol (VITAMIN D) 1000 UNITS tablet   Oral   Take 1,000 Units by mouth daily.         . furosemide (LASIX) 40 MG tablet   Oral   Take 80 mg by mouth 2 (two) times daily.         . hydrALAZINE (APRESOLINE) 25 MG tablet   Oral   Take 1 tablet (25 mg total) by mouth 3 (three) times daily.   90 tablet   6   . isosorbide mononitrate (IMDUR) 30 MG 24 hr tablet   Oral   Take 1 tablet (30 mg total) by mouth daily.   30 tablet   6   . nitroGLYCERIN (NITROSTAT) 0.4 MG SL tablet   Sublingual   Place 1 tablet (0.4 mg total) under the tongue every 5 (five) minutes as  needed for chest pain.   25 tablet   2   . potassium chloride 20 MEQ TBCR   Oral   Take 20 mEq by mouth daily.   30 tablet   6   . pravastatin (PRAVACHOL) 40 MG tablet   Oral   Take 1 tablet (40 mg total) by mouth at bedtime.   30 tablet   6   . PROAIR HFA 108 (90 BASE) MCG/ACT inhaler   Inhalation   Inhale 1 puff into the lungs every 4 (four) hours as needed.          . ranolazine (RANEXA) 500 MG 12 hr tablet   Oral   Take 1 tablet (500 mg total) by mouth 2 (two) times daily.   60 tablet   3     .Marland KitchenPatient needs to contact office to schedule  App ...   . spironolactone (ALDACTONE) 25 MG tablet   Oral   Take 25 mg by mouth daily.          BP 138/103  Pulse 63  Temp(Src) 97.8 F (36.6 C) (Oral)  Resp 25  Ht 5\' 10"  (1.778 m)  Wt 245 lb (111.131 kg)  BMI 35.15 kg/m2  SpO2 99% Physical Exam  Nursing note and vitals reviewed. Constitutional: He is oriented to person, place, and time. He appears well-developed and well-nourished.  HENT:  Head: Normocephalic and atraumatic.  Eyes: EOM are normal.  Neck: Normal range of motion.  Cardiovascular: Normal rate, regular rhythm, normal heart sounds and intact distal pulses.   Pulmonary/Chest: Effort normal and breath sounds normal. No respiratory distress.  Abdominal: Soft. He exhibits no distension. There is no tenderness.  Musculoskeletal: Normal range of motion.  Mild tenderness of right forearm without obvious deformity.  No erythema or cellulitis to this area.  Forward motion of right wrist and right elbow the  Neurological: He is alert and oriented to person, place, and time.  Skin: Skin is warm and dry.  Psychiatric: He has a normal mood and affect. Judgment normal.    ED Course  Procedures (including critical care time) Labs Review Labs Reviewed  CBC - Abnormal; Notable for the following:    Hemoglobin 12.6 (*)  RDW 16.8 (*)    All other components within normal limits  BASIC METABOLIC PANEL -  Abnormal; Notable for the following:    Glucose, Bld 127 (*)    BUN 35 (*)    Creatinine, Ser 2.79 (*)    GFR calc non Af Amer 26 (*)    GFR calc Af Amer 30 (*)    All other components within normal limits  PRO B NATRIURETIC PEPTIDE  I-STAT TROPOININ, ED   Imaging Review Dg Chest Port 1 View  11/17/2013   CLINICAL DATA:  Chest pain, shortness of breath  EXAM: PORTABLE CHEST - 1 VIEW  COMPARISON:  08/24/2013  FINDINGS: Moderate to severe cardiomegaly with pulmonary vascular congestion and suspected mild interstitial edema. No definite pleural effusions. No pneumothorax.  Right perihilar fullness is chronic.  IMPRESSION: Cardiomegaly with pulmonary vascular congestion and suspected mild interstitial edema.   Electronically Signed   By: Charline BillsSriyesh  Krishnan M.D.   On: 11/17/2013 08:19  I personally reviewed the imaging tests through PACS system I reviewed available ER/hospitalization records through the EMR   ECG interpretation   Date: 11/17/2013  Rate: 58  Rhythm: normal sinus rhythm  QRS Axis: normal  Intervals: normal  ST/T Wave abnormalities: normal  Conduction Disutrbances: none  Narrative Interpretation: pvc  Old EKG Reviewed: No significant changes noted     MDM   Final diagnoses:  None    Atypical symptoms today.  Pain improved with pain medicine.  Doubt ACS.  Chest x-ray with mild interstitial edema.  This may represent mild congestive heart failure exacerbation. no hypoxia.  We'll provide Lasix for outpatient.  Close PCP followup.  She understands return to the ER for new or worsening symptoms    Lyanne CoKevin M Nishita Isaacks, MD 11/17/13 1148

## 2013-11-19 ENCOUNTER — Encounter: Payer: Self-pay | Admitting: Cardiology

## 2013-12-07 NOTE — Telephone Encounter (Signed)
Follow Up    States she faxed back for additional information regarding findings. Please call.

## 2013-12-09 ENCOUNTER — Encounter: Payer: Self-pay | Admitting: *Deleted

## 2013-12-09 NOTE — Telephone Encounter (Signed)
Left message for Morrie Sheldon that I still have not received any additional information on pt from her.  Gave her our fax number again.

## 2013-12-14 ENCOUNTER — Inpatient Hospital Stay (HOSPITAL_COMMUNITY)
Admission: EM | Admit: 2013-12-14 | Discharge: 2013-12-19 | DRG: 292 | Disposition: A | Discharge status: Home / Self Care | Payer: Medicare Other | Attending: Cardiology | Admitting: Cardiology

## 2013-12-14 ENCOUNTER — Encounter (HOSPITAL_COMMUNITY): Payer: Self-pay | Admitting: *Deleted

## 2013-12-14 ENCOUNTER — Emergency Department (HOSPITAL_COMMUNITY): Payer: Medicare Other

## 2013-12-14 DIAGNOSIS — I498 Other specified cardiac arrhythmias: Secondary | ICD-10-CM

## 2013-12-14 DIAGNOSIS — R079 Chest pain, unspecified: Secondary | ICD-10-CM | POA: Diagnosis not present

## 2013-12-14 DIAGNOSIS — I5023 Acute on chronic systolic (congestive) heart failure: Secondary | ICD-10-CM

## 2013-12-14 DIAGNOSIS — D649 Anemia, unspecified: Secondary | ICD-10-CM

## 2013-12-14 DIAGNOSIS — I252 Old myocardial infarction: Secondary | ICD-10-CM

## 2013-12-14 DIAGNOSIS — N183 Chronic kidney disease, stage 3 unspecified: Secondary | ICD-10-CM

## 2013-12-14 DIAGNOSIS — I5042 Chronic combined systolic (congestive) and diastolic (congestive) heart failure: Secondary | ICD-10-CM | POA: Diagnosis present

## 2013-12-14 DIAGNOSIS — I2510 Atherosclerotic heart disease of native coronary artery without angina pectoris: Secondary | ICD-10-CM | POA: Diagnosis not present

## 2013-12-14 DIAGNOSIS — I160 Hypertensive urgency: Secondary | ICD-10-CM

## 2013-12-14 DIAGNOSIS — I5043 Acute on chronic combined systolic (congestive) and diastolic (congestive) heart failure: Secondary | ICD-10-CM

## 2013-12-14 DIAGNOSIS — Z79899 Other long term (current) drug therapy: Secondary | ICD-10-CM

## 2013-12-14 DIAGNOSIS — I509 Heart failure, unspecified: Secondary | ICD-10-CM

## 2013-12-14 DIAGNOSIS — Z91199 Patient's noncompliance with other medical treatment and regimen due to unspecified reason: Secondary | ICD-10-CM

## 2013-12-14 DIAGNOSIS — E785 Hyperlipidemia, unspecified: Secondary | ICD-10-CM | POA: Diagnosis present

## 2013-12-14 DIAGNOSIS — N189 Chronic kidney disease, unspecified: Secondary | ICD-10-CM

## 2013-12-14 DIAGNOSIS — R0601 Orthopnea: Secondary | ICD-10-CM | POA: Diagnosis present

## 2013-12-14 DIAGNOSIS — I428 Other cardiomyopathies: Secondary | ICD-10-CM | POA: Diagnosis present

## 2013-12-14 DIAGNOSIS — I10 Essential (primary) hypertension: Secondary | ICD-10-CM | POA: Diagnosis not present

## 2013-12-14 DIAGNOSIS — R0602 Shortness of breath: Secondary | ICD-10-CM | POA: Diagnosis present

## 2013-12-14 DIAGNOSIS — I130 Hypertensive heart and chronic kidney disease with heart failure and stage 1 through stage 4 chronic kidney disease, or unspecified chronic kidney disease: Secondary | ICD-10-CM | POA: Diagnosis present

## 2013-12-14 DIAGNOSIS — I119 Hypertensive heart disease without heart failure: Secondary | ICD-10-CM

## 2013-12-14 DIAGNOSIS — Z9119 Patient's noncompliance with other medical treatment and regimen: Secondary | ICD-10-CM

## 2013-12-14 DIAGNOSIS — Z833 Family history of diabetes mellitus: Secondary | ICD-10-CM

## 2013-12-14 DIAGNOSIS — Z87891 Personal history of nicotine dependence: Secondary | ICD-10-CM

## 2013-12-14 DIAGNOSIS — Z8249 Family history of ischemic heart disease and other diseases of the circulatory system: Secondary | ICD-10-CM

## 2013-12-14 LAB — CBC
HCT: 39.8 % (ref 39.0–52.0)
HEMATOCRIT: 40.2 % (ref 39.0–52.0)
HEMOGLOBIN: 12.8 g/dL — AB (ref 13.0–17.0)
Hemoglobin: 12.8 g/dL — ABNORMAL LOW (ref 13.0–17.0)
MCH: 27.8 pg (ref 26.0–34.0)
MCH: 28.6 pg (ref 26.0–34.0)
MCHC: 31.8 g/dL (ref 30.0–36.0)
MCHC: 32.2 g/dL (ref 30.0–36.0)
MCV: 87.4 fL (ref 78.0–100.0)
MCV: 88.8 fL (ref 78.0–100.0)
PLATELETS: 248 10*3/uL (ref 150–400)
Platelets: 248 10*3/uL (ref 150–400)
RBC: 4.6 MIL/uL (ref 4.22–5.81)
RBC: 4.48 MIL/uL (ref 4.22–5.81)
RDW: 16.3 % — ABNORMAL HIGH (ref 11.5–15.5)
RDW: 16.4 % — AB (ref 11.5–15.5)
WBC: 8.7 10*3/uL (ref 4.0–10.5)
WBC: 7.9 10*3/uL (ref 4.0–10.5)

## 2013-12-14 LAB — CREATININE, SERUM
Creatinine, Ser: 2.23 mg/dL — ABNORMAL HIGH (ref 0.50–1.35)
GFR calc non Af Amer: 34 mL/min — ABNORMAL LOW (ref >90)
GFR, EST AFRICAN AMERICAN: 39 mL/min — AB (ref >90)

## 2013-12-14 LAB — PRO B NATRIURETIC PEPTIDE: Pro B Natriuretic peptide (BNP): 57585 pg/mL — ABNORMAL HIGH (ref 0–125)

## 2013-12-14 LAB — I-STAT TROPONIN, ED: Troponin i, poc: 0.08 ng/mL (ref 0.00–0.08)

## 2013-12-14 LAB — BASIC METABOLIC PANEL
BUN: 23 mg/dL (ref 6–23)
CHLORIDE: 102 meq/L (ref 96–112)
CO2: 22 mEq/L (ref 19–32)
CREATININE: 2.13 mg/dL — AB (ref 0.50–1.35)
Calcium: 8.7 mg/dL (ref 8.4–10.5)
GFR calc Af Amer: 41 mL/min — ABNORMAL LOW (ref >90)
GFR calc non Af Amer: 35 mL/min — ABNORMAL LOW (ref >90)
Glucose, Bld: 101 mg/dL — ABNORMAL HIGH (ref 70–99)
Potassium: 4.8 mEq/L (ref 3.7–5.3)
Sodium: 140 mEq/L (ref 137–147)

## 2013-12-14 LAB — MRSA PCR SCREENING: MRSA by PCR: NEGATIVE

## 2013-12-14 MED ORDER — SIMVASTATIN 20 MG PO TABS
20.0000 mg | ORAL_TABLET | Freq: Every day | ORAL | Status: DC
  Administered 2013-12-14: 20 mg via ORAL
  Filled 2013-12-14 (×2): qty 1

## 2013-12-14 MED ORDER — SODIUM CHLORIDE 0.9 % IJ SOLN
3.0000 mL | Freq: Two times a day (BID) | INTRAMUSCULAR | Status: DC
  Administered 2013-12-14 – 2013-12-19 (×9): 3 mL via INTRAVENOUS

## 2013-12-14 MED ORDER — ISOSORBIDE MONONITRATE ER 30 MG PO TB24
30.0000 mg | ORAL_TABLET | Freq: Every day | ORAL | Status: DC
  Administered 2013-12-15: 30 mg via ORAL
  Filled 2013-12-14: qty 1

## 2013-12-14 MED ORDER — SODIUM CHLORIDE 0.9 % IV SOLN: 250.0000 mL | INTRAVENOUS | Status: DC | PRN

## 2013-12-14 MED ORDER — SODIUM CHLORIDE 0.9 % IJ SOLN: 3.0000 mL | INTRAMUSCULAR | Status: DC | PRN

## 2013-12-14 MED ORDER — NITROGLYCERIN 0.4 MG SL SUBL
0.4000 mg | SUBLINGUAL_TABLET | SUBLINGUAL | Status: DC | PRN
  Administered 2013-12-16 (×2): 0.4 mg via SUBLINGUAL
  Filled 2013-12-14: qty 1

## 2013-12-14 MED ORDER — BUDESONIDE-FORMOTEROL FUMARATE 160-4.5 MCG/ACT IN AERO
2.0000 | INHALATION_SPRAY | Freq: Two times a day (BID) | RESPIRATORY_TRACT | Status: DC
  Administered 2013-12-14 – 2013-12-19 (×9): 2 via RESPIRATORY_TRACT
  Filled 2013-12-14: qty 6

## 2013-12-14 MED ORDER — RANOLAZINE ER 500 MG PO TB12
500.0000 mg | ORAL_TABLET | Freq: Two times a day (BID) | ORAL | Status: DC
  Administered 2013-12-14 – 2013-12-19 (×10): 500 mg via ORAL
  Filled 2013-12-14 (×11): qty 1

## 2013-12-14 MED ORDER — ASPIRIN EC 325 MG PO TBEC
325.0000 mg | DELAYED_RELEASE_TABLET | Freq: Every day | ORAL | Status: DC
  Administered 2013-12-15: 325 mg via ORAL
  Filled 2013-12-14: qty 1

## 2013-12-14 MED ORDER — NITROGLYCERIN IN D5W 200-5 MCG/ML-% IV SOLN
2.0000 ug/min | INTRAVENOUS | Status: DC
  Administered 2013-12-14: 10 ug/min via INTRAVENOUS
  Administered 2013-12-15: 45 ug/min via INTRAVENOUS
  Filled 2013-12-14 (×2): qty 250

## 2013-12-14 MED ORDER — ASPIRIN EC 325 MG PO TBEC: 325.0000 mg | DELAYED_RELEASE_TABLET | Freq: Every day | ORAL | Status: DC

## 2013-12-14 MED ORDER — POTASSIUM CHLORIDE CRYS ER 20 MEQ PO TBCR
20.0000 meq | EXTENDED_RELEASE_TABLET | Freq: Every day | ORAL | Status: DC
  Administered 2013-12-15: 20 meq via ORAL
  Filled 2013-12-14: qty 1

## 2013-12-14 MED ORDER — FUROSEMIDE 10 MG/ML IJ SOLN: 40.0000 mg | Freq: Once | INTRAMUSCULAR | Status: DC

## 2013-12-14 MED ORDER — HYDRALAZINE HCL 50 MG PO TABS
50.0000 mg | ORAL_TABLET | Freq: Three times a day (TID) | ORAL | Status: DC
  Administered 2013-12-14 – 2013-12-16 (×6): 50 mg via ORAL
  Filled 2013-12-14 (×8): qty 1

## 2013-12-14 MED ORDER — HEPARIN SODIUM (PORCINE) 5000 UNIT/ML IJ SOLN
5000.0000 [IU] | Freq: Three times a day (TID) | INTRAMUSCULAR | Status: DC
  Administered 2013-12-14 – 2013-12-19 (×14): 5000 [IU] via SUBCUTANEOUS
  Filled 2013-12-14 (×19): qty 1

## 2013-12-14 MED ORDER — MORPHINE SULFATE 4 MG/ML IJ SOLN
4.0000 mg | Freq: Once | INTRAMUSCULAR | Status: AC
  Administered 2013-12-14: 4 mg via INTRAVENOUS
  Filled 2013-12-14: qty 1

## 2013-12-14 MED ORDER — CARVEDILOL 25 MG PO TABS
50.0000 mg | ORAL_TABLET | Freq: Two times a day (BID) | ORAL | Status: DC
  Administered 2013-12-14 – 2013-12-19 (×10): 50 mg via ORAL
  Filled 2013-12-14 (×12): qty 2

## 2013-12-14 MED ORDER — ONDANSETRON HCL 4 MG/2ML IJ SOLN
4.0000 mg | Freq: Once | INTRAMUSCULAR | Status: AC
  Administered 2013-12-14: 4 mg via INTRAVENOUS
  Filled 2013-12-14: qty 2

## 2013-12-14 MED ORDER — FUROSEMIDE 10 MG/ML IJ SOLN
80.0000 mg | Freq: Two times a day (BID) | INTRAMUSCULAR | Status: DC
  Administered 2013-12-14 – 2013-12-17 (×6): 80 mg via INTRAVENOUS
  Filled 2013-12-14 (×9): qty 8

## 2013-12-14 MED ORDER — FUROSEMIDE 10 MG/ML IJ SOLN
40.0000 mg | Freq: Once | INTRAMUSCULAR | Status: AC
  Administered 2013-12-14: 40 mg via INTRAVENOUS
  Filled 2013-12-14: qty 4

## 2013-12-14 NOTE — ED Notes (Addendum)
Per [facility]: Pt from [facility] with non-radiating central chest pressure since 0200 this AM. Report SOB, diaphoresis and nausea. Pt given 2 nitro, relieving pain from 10/10 to 8/10. EKG showed elevation V4, V5; NSR. 164/129. 87 bpm.

## 2013-12-14 NOTE — Care Management Note (Signed)
    Page 1 of 1   12/14/2013     3:53:46 PM   CARE MANAGEMENT NOTE 12/14/2013  Patient:  [name],[name]   Account Number:  [id number]  Date Initiated:  12/14/2013  Documentation initiated by:  [name]  Subjective/Objective Assessment:   adm w heart failure     Action/Plan:   from home, pcp dr [name]   Anticipated DC Date:     Anticipated DC Plan:  HOME W HOME HEALTH SERVICES         Choice offered to / List presented to:             Status of service:   Medicare Important Message given?   (If response is "NO", the following Medicare IM given date fields will be blank) Date Medicare IM given:   Date Additional Medicare IM given:    Discharge Disposition:    Per UR Regulation:  Reviewed for med. necessity/level of care/duration of stay  If discussed at Long Length of Stay Meetings, dates discussed:    Comments:  4/13 1552 [name] Brayla Pat rn,bsn adm w heart failure. will moniter for dc planning as pt progresses. hx of ahc .

## 2013-12-14 NOTE — ED Notes (Signed)
Pt placed on 2L [place] per PA Robyn

## 2013-12-14 NOTE — H&P (Signed)
Patient seen and examined, chart and labs reviewed and agree with H&P as outlined by Adline Mango, PA.  Patient has a long history of mixed ischemic/nonischemic DCM ED 15% by last echo in fall of 2014 with multiple hospitalizations for hypertensive urgency and acute on chronic combined systolic/diastolic CHF secondary to medical and dietary noncompliance.  Last in hospital in Dec 2014 for identical problem and did not followup as an outpt.  He has had 3 days of PND and orthopnea requiring him to sleep in a recliner.  He has marked LE edema up to his hips and his weight is up 15 pounds.  He states he has not missed his meds and has not been using any extra sodium in his diet.  Became acutely SOB early this am prompting visit to the ER where he was found to be in acute CHF and markedly hypertensive.  He was given Lasix 40mg  IV in ER.  He has marked LE edema but is resting comfortably with his breathing currently.  His BP is markedly elevated at 187/154mmHg.  Will admit to stepdown and start Lasix 80mg  IV BID.  Increase Hydralazine to 50mg  TID and start IV NTG and titrate for BP control.  Cycle cardiac enzymes. CP is atypical so will use SQ Heparin for DVT prophylaxis unless Troponin becomes positive.

## 2013-12-14 NOTE — Progress Notes (Signed)
Admitted from the ED awake, alert and oriented. Denied any discomfort.

## 2013-12-14 NOTE — ED Provider Notes (Signed)
CSN: [id number]     Arrival date & time 12/14/13  1200 History   First MD Initiated Contact with Patient 12/14/13 1200     Chief Complaint  Patient presents with  . Chest Pain     (Consider location/radiation/quality/duration/timing/severity/associated sxs/prior Treatment) HPI Comments: Patient is a [age] old male with an extensive past medical history including CAD, and STEMI 2008, last cardiac catheterization 2012, hypertension, chronic kidney disease not on dialysis, CHF and SVT who presents to the emergency department via [facility] urgent care complaining of gradual onset midsternal chest pressure beginning around 2:00 this morning. Pain is constant, nonradiating, initially rated 10 out of 10, decreasing to 7/10 after receiving 2 sublingual nitroglycerin. Admits to associated shortness of breath, worse on exertion, diaphoresis and nausea. No vomiting. States his legs began to have increased swelling this morning as well. PCP Dr. Lysbeth GalasNyland, cardiologist Dr. Antoine PocheHochrein.  Patient is a 48 y.o. male presenting with chest pain. The history is provided by the patient and the EMS personnel.  Chest Pain Associated symptoms: diaphoresis, nausea and shortness of breath     Past Medical History  Diagnosis Date  . CAD (coronary artery disease)     a. NSTEMI 2008: in setting of profound epistaxis/anemia, cath showing severe diffuse RCA dz with L-R collaterals, nonobst dz in left system, managed medically. b. Type II NSTEMI 10/2008. c. Last cath 2012 - for med rx. d. 05/2013: elevated troponin ? due to ARF.  e. Ranexa added 03/2012.  Marland Kitchen. Hypertension     a. Difficult to control. b. Renal duplex neg for RAS 11/2012.  Marland Kitchen. Hyperlipidemia   . Chronic kidney disease     a. Average creatine of about 2.3  . Cardiomyopathy     a. Mixed ICM/NICM - Prior EF 25% back to 2008. b. Improved to 55% in 2012. c. Subsequent echoes: 45% in 11/2010, 15% by echo 05/2013.  Marland Kitchen. Anemia     a. 2008 in setting of profound  epistaxis.  Marland Kitchen. History of pneumonia 08/2012, 05/2013  . Epistaxis     a. 2008, 2009 a/w significant anemia.  Marland Kitchen. SVT (supraventricular tachycardia)     a. 05/2013 tx with 6mg  adenosine in ER.  . CHF (congestive heart failure)     EF 15%   Past Surgical History  Procedure Laterality Date  . Cardiac catheterization  2012    total RCA occlusion with left-to-right collaterals supplying the distal RCA branches, mild diffuse nonobstructive disease of LAD and LCx. Medically managed   Family History  Problem Relation Age of Onset  . Hypertension Mother     deceased  . Diabetes Mother     deceased  . Heart disease Mother     deceased  . Heart attack Father     at age 47, deceased   History  Substance Use Topics  . Smoking status: Former Smoker -- 1.00 packs/day for 10 years    Types: Cigarettes    Quit date: 11/16/2006  . Smokeless tobacco: Never Used  . Alcohol Use: No    Review of Systems  Constitutional: Positive for diaphoresis.  Respiratory: Positive for shortness of breath.   Cardiovascular: Positive for chest pain and leg swelling.  Gastrointestinal: Positive for nausea.  All other systems reviewed and are negative.     Allergies  Review of patient's allergies indicates no known allergies.  Home Medications   Current Outpatient Rx  Name  Route  Sig  Dispense  Refill  . aspirin  325 MG EC tablet   Oral   Take 325 mg by mouth daily.         . budesonide-formoterol (SYMBICORT) 160-4.5 MCG/ACT inhaler   Inhalation   Inhale 2 puffs into the lungs 2 (two) times daily.         . calcitRIOL (ROCALTROL) 0.25 MCG capsule   Oral   Take 0.25 mcg by mouth 3 (three) times a week. On Monday, Wednesday, and Friday.         . carvedilol (COREG) 25 MG tablet   Oral   Take 50 mg by mouth 2 (two) times daily with a meal.         . cholecalciferol (VITAMIN D) 1000 UNITS tablet   Oral   Take 1,000 Units by mouth daily.         . furosemide (LASIX) 40 MG tablet    Oral   Take 1 tablet (40 mg total) by mouth 2 (two) times daily.   20 tablet   0   . hydrALAZINE (APRESOLINE) 25 MG tablet   Oral   Take 1 tablet (25 mg total) by mouth 3 (three) times daily.   90 tablet   6   . HYDROcodone-acetaminophen (NORCO/VICODIN) 5-325 MG per tablet   Oral   Take 1 tablet by mouth every 4 (four) hours as needed for moderate pain.   15 tablet   0   . isosorbide mononitrate (IMDUR) 30 MG 24 hr tablet   Oral   Take 1 tablet (30 mg total) by mouth daily.   30 tablet   6   . nitroGLYCERIN (NITROSTAT) 0.4 MG SL tablet   Sublingual   Place 1 tablet (0.4 mg total) under the tongue every 5 (five) minutes as needed for chest pain.   25 tablet   2   . potassium chloride SA (K-DUR,KLOR-CON) 20 MEQ tablet   Oral   Take 20 mEq by mouth daily.          . pravastatin (PRAVACHOL) 40 MG tablet   Oral   Take 1 tablet (40 mg total) by mouth at bedtime.   30 tablet   6   . ranolazine (RANEXA) 500 MG 12 hr tablet   Oral   Take 1 tablet (500 mg total) by mouth 2 (two) times daily.   60 tablet   3     .Marland KitchenPatient needs to contact office to schedule  App ...    BP 183/151  Pulse 95  Temp(Src) 98.5 F (36.9 C) (Oral)  Resp 19  Ht 5\' 10"  (1.778 m)  Wt 239 lb (108.41 kg)  BMI 34.29 kg/m2  SpO2 99% Physical Exam  Nursing note and vitals reviewed. Constitutional: He is oriented to person, place, and time. He appears well-developed and well-nourished. No distress.  HENT:  Head: Normocephalic and atraumatic.  Mouth/Throat: Oropharynx is clear and moist.  Eyes: Conjunctivae and EOM are normal. Pupils are equal, round, and reactive to light.  Neck: Normal range of motion. Neck supple. No JVD present.  Cardiovascular: Normal rate, regular rhythm, normal heart sounds and intact distal pulses.   +2 pitting edema LE bilateral from feet up to mid-thigh.  Pulmonary/Chest: Effort normal. No respiratory distress. He has rales (bilateral lung bases).  Abdominal: Soft.  Bowel sounds are normal. There is no tenderness.  Musculoskeletal: Normal range of motion. He exhibits no edema.  Neurological: He is alert and oriented to person, place, and time. He has normal strength. No sensory deficit.  Speech  fluent, goal oriented. Moves limbs without ataxia. Equal grip strength bilateral.  Skin: Skin is warm and dry. He is not diaphoretic.  Psychiatric: He has a normal mood and affect. His behavior is normal.    ED Course  Procedures (including critical care time) Labs Review Labs Reviewed  CBC - Abnormal; Notable for the following:    Hemoglobin 12.8 (*)    RDW 16.3 (*)    All other components within normal limits  BASIC METABOLIC PANEL - Abnormal; Notable for the following:    Glucose, Bld 101 (*)    Creatinine, Ser 2.13 (*)    GFR calc non Af Amer 35 (*)    GFR calc Af Amer 41 (*)    All other components within normal limits  PRO B NATRIURETIC PEPTIDE - Abnormal; Notable for the following:    Pro B Natriuretic peptide (BNP) 57585.0 (*)    All other components within normal limits  I-STAT TROPOININ, ED   Imaging Review Dg Chest 2 View  12/14/2013   CLINICAL DATA:  CHEST PAIN  EXAM: CHEST  2 VIEW  COMPARISON:  DG CHEST 1V PORT dated 11/17/2013  FINDINGS: Low lung volumes. Stable cardiomegaly. Views prominence of the interstitial markings and areas of peribronchial cuffing. Areas increased density appreciated within the lung bases. Osseous structures unremarkable. Atherosclerotic calcifications are identified within the aorta.  IMPRESSION: Interstitial infiltrate likely reflecting component pulmonary edema  Focal infiltrates versus atelectasis within the lung bases right greater than left.  Stable cardiomegaly   Electronically Signed   By: Salome Holmes M.D.   On: 12/14/2013 13:01     EKG Interpretation   Date/Time:  Monday December 14 2013 12:05:34 EDT Ventricular Rate:  88 PR Interval:  181 QRS Duration: 122 QT Interval:  425 QTC Calculation: 514 R  Axis:   -54 Text Interpretation:  Sinus rhythm Atrial premature complex Left atrial  enlargement Nonspecific IVCD with LAD Anterior infarct, old Nonspecific T  abnormalities, lateral leads No significant change since last tracing  Confirmed by [facility]  MD, CHRISTOPHER [phone]) on 12/14/2013 12:19:25 PM      MDM   Final diagnoses:  Acute on chronic combined systolic and diastolic CHF (congestive heart failure)  Chest pain  Coronary atherosclerosis of unspecified type of vessel, native or graft  Hypertensive urgency  Shortness of breath   Patient presenting with chest pain, shortness of breath and edema. He appears in no current distress. Hypertensive on arrival 176/126. He didn't take his blood pressure medications this morning. Labs, CXR pending. EKG unchanged. Probable admission. Case discussed with attending Dr. Blinda Leatherwood who also evaluated patient and agrees with plan of care.   BNP [id number]. Chest x-ray showing interstitial infiltrate likely reflecting component pulmonary edema. 40 mg IV Lasix given. Cardiology consulted and will evaluate patient.  Pt evaluated by cardiology PA Boyce Medici along with Dr. Mayford Knife, will admit.   Trevor Mace, PA-C 12/14/13 1421  Trevor Mace, PA-C 12/14/13 1422

## 2013-12-14 NOTE — H&P (Signed)
Primary Cardiologist: Dr. Warren Lacy   Chief Complaint: SOB and Bilateral LEE   HPI: [name] is a 48 y/o male, followed by Dr. Warren Lacy, with a history of mixed ischemic and nonischemic heart disease, with an EF of 15% on most recent 2D echo in September 2014. He has had multiple admissions for acute on chronic CHF related to hypertensive urgency, in the setting of medical noncompliance. His last hospital admission for CHF was in September 2014. At that time, he was diuresed to a dry weight of 222 lbs. He was discharged on 40 mg of Lasix daily, 25 mg of Carvedilol, 25 mg of hydralazine TID and 30 mg of Imdur. The patient is not a candidate for an ACE inhibitor secondary to renal insufficiency. Baseline SCr ~2.5.   He presents to the [facility] ER today with a complaint of SOB and bilateral LEE. He states symptoms developed over the last 24 hrs and have progressed. He notes dyspnea at rest with associated orthopnea and PND. He notes mild SSCP, described as sharp, pleuritic and non radiating. He claims that he has been compliant with his medications, including Lasix and denies dietary indiscretion.  In the ER, his BNP is elevated at 57, 585. CXR consistent with CHF. BP is elevated at 187/140. His weight is 239 lbs.    Past Medical History   Diagnosis  Date   .  CAD (coronary artery disease)      a. NSTEMI 2008: in setting of profound epistaxis/anemia, cath showing severe diffuse RCA dz with L-R collaterals, nonobst dz in left system, managed medically. b. Type II NSTEMI 10/2008. c. Last cath 2012 - for med rx. d. 05/2013: elevated troponin ? due to ARF. e. Ranexa added 03/2012.   Marland Kitchen  Hypertension      a. Difficult to control. b. Renal duplex neg for RAS 11/2012.   Marland Kitchen  Hyperlipidemia    .  Chronic kidney disease      a. Average creatine of about 2.3   .  Cardiomyopathy      a. Mixed ICM/NICM - Prior EF 25% back to 2008. b. Improved to 55% in 2012. c. Subsequent echoes: 45% in 11/2010, 15% by echo 05/2013.   Marland Kitchen   Anemia      a. 2008 in setting of profound epistaxis.   Marland Kitchen  History of pneumonia  08/2012, 05/2013   .  Epistaxis      a. 2008, 2009 a/w significant anemia.   Marland Kitchen  SVT (supraventricular tachycardia)      a. 05/2013 tx with 58m adenosine in ER.   .  CHF (congestive heart failure)      EF 15%    Past Surgical History   Procedure  Laterality  Date   .  Cardiac catheterization   2012     total RCA occlusion with left-to-right collaterals supplying the distal RCA branches, mild diffuse nonobstructive disease of LAD and LCx. Medically managed    Family History   Problem  Relation  Age of Onset   .  Hypertension  Mother      deceased   .  Diabetes  Mother      deceased   .  Heart disease  Mother      deceased   .  Heart attack  Father      at age 47 deceased    Social History: reports that he quit smoking about 7 years ago. His smoking use included Cigarettes. He has a 10 pack-year smoking history.  He has never used smokeless tobacco. He reports that he uses illicit drugs. He reports that he does not drink alcohol.  Allergies: No Known Allergies   (Not in a hospital admission)  Results for orders placed during the hospital encounter of 12/14/13 (from the past 48 hour(s))   CBC Status: Abnormal    Collection Time    12/14/13 12:15 PM   Result  Value  Ref Range    WBC  8.7  4.0 - 10.5 K/uL    RBC  4.60  4.22 - 5.81 MIL/uL    Hemoglobin  12.8 (*)  13.0 - 17.0 g/dL    HCT  40.2  39.0 - 52.0 %    MCV  87.4  78.0 - 100.0 fL    MCH  27.8  26.0 - 34.0 pg    MCHC  31.8  30.0 - 36.0 g/dL    RDW  16.3 (*)  11.5 - 15.5 %    Platelets  248  150 - 400 K/uL   BASIC METABOLIC PANEL Status: Abnormal    Collection Time    12/14/13 12:15 PM   Result  Value  Ref Range    Sodium  140  137 - 147 mEq/L    Potassium  4.8  3.7 - 5.3 mEq/L    Comment:  HEMOLYSIS AT THIS LEVEL MAY AFFECT RESULT    Chloride  102  96 - 112 mEq/L    CO2  22  19 - 32 mEq/L    Glucose, Bld  101 (*)  70 - 99 mg/dL    BUN   23  6 - 23 mg/dL    Creatinine, Ser  2.13 (*)  0.50 - 1.35 mg/dL    Calcium  8.7  8.4 - 10.5 mg/dL    GFR calc non Af Amer  35 (*)  >90 mL/min    GFR calc Af Amer  41 (*)  >90 mL/min    Comment:  (NOTE)     The eGFR has been calculated using the CKD EPI equation.     This calculation has not been validated in all clinical situations.     eGFR's persistently <90 mL/min signify possible Chronic Kidney     Disease.   PRO B NATRIURETIC PEPTIDE Status: Abnormal    Collection Time    12/14/13 12:15 PM   Result  Value  Ref Range    Pro B Natriuretic peptide (BNP)  57585.0 (*)  0 - 125 pg/mL   I-STAT TROPOININ, ED Status: None    Collection Time    12/14/13 12:19 PM   Result  Value  Ref Range    Troponin i, poc  0.08  0.00 - 0.08 ng/mL    Comment 3      Comment:  Due to the release kinetics of cTnI,     a negative result within the first hours     of the onset of symptoms does not rule out     myocardial infarction with certainty.     If myocardial infarction is still suspected,     repeat the test at appropriate intervals.    Dg Chest 2 View  12/14/2013 CLINICAL DATA: CHEST PAIN EXAM: CHEST 2 VIEW COMPARISON: DG CHEST 1V PORT dated 11/17/2013 FINDINGS: Low lung volumes. Stable cardiomegaly. Views prominence of the interstitial markings and areas of peribronchial cuffing. Areas increased density appreciated within the lung bases. Osseous structures unremarkable. Atherosclerotic calcifications are identified within the aorta. IMPRESSION: Interstitial infiltrate likely reflecting  component pulmonary edema Focal infiltrates versus atelectasis within the lung bases right greater than left. Stable cardiomegaly Electronically Signed By: Margaree Mackintosh M.D. On: 12/14/2013 13:01   Review of Systems  Respiratory: Positive for shortness of breath.  Cardiovascular: Positive for leg swelling.  All other systems reviewed and are negative.   Blood pressure 187/140, pulse 92, temperature 98.5 F (36.9 C),  temperature source Oral, resp. rate 23, height 5' 10"  (1.778 m), weight 239 lb (108.41 kg), SpO2 100.00%.  Physical Exam  Constitutional: He is oriented to person, place, and time. He appears well-developed and well-nourished. No distress.  Cardiovascular: Normal rate, regular rhythm, normal heart sounds and intact distal pulses. Exam reveals no gallop and no friction rub.  No murmur heard.  Respiratory: Effort normal. No respiratory distress. He has rales (bibasilar).  Musculoskeletal: He exhibits edema (3+ bilateral edema).  Neurological: He is alert and oriented to person, place, and time.  Skin: Skin is warm and dry. He is not diaphoretic.  Psychiatric: He has a normal mood and affect. His behavior is normal.    Assessment/Plan  Active Problems:  HYPERTENSION  CKD (chronic kidney disease), stage III  Shortness of breath  Acute on chronic combined systolic and diastolic CHF (congestive heart failure)   1. A/C combined systolic + diastolic CHF: Will admit for IV diuresis. Will increase to 80 mg IV Lasix BID. His dry weight t is 222lb . Weight today is 239. Strict I/Os. Daily weights. Low sodium diet.   2. Hypertension: He needs better control. Will increase Hydralazine to 50 mg TID. Continue Coreg.   Brittainy Simmons  12/14/2013, 2:01 PM

## 2013-12-14 NOTE — ED Notes (Addendum)
Cardiology PA Brittany at bedside 

## 2013-12-14 NOTE — ED Notes (Signed)
PA Robyn at bedside  

## 2013-12-14 NOTE — ED Provider Notes (Signed)
Medical screening examination/treatment/procedure(s) were conducted as a shared visit with non-physician practitioner(s) and myself.  I personally evaluated the patient during the encounter.   EKG Interpretation   Date/Time:  Monday December 14 2013 12:05:34 EDT Ventricular Rate:  88 PR Interval:  181 QRS Duration: 122 QT Interval:  425 QTC Calculation: 514 R Axis:   -54 Text Interpretation:  Sinus rhythm Atrial premature complex Left atrial  enlargement Nonspecific IVCD with LAD Anterior infarct, old Nonspecific T  abnormalities, lateral leads No significant change since last tracing  Confirmed by POLLINA  MD, CHRISTOPHER [phone]) on 12/14/2013 12:19:25 PM      Patient with extensive cardiac history including CHF presents with chest pain and shortness of breath with increased swelling of the legs to the thigh. Evaluation is consistent with decompensated CHF requiring admission to the hospital.  Gilda Crease, MD 12/14/13 [phone]

## 2013-12-15 DIAGNOSIS — I119 Hypertensive heart disease without heart failure: Secondary | ICD-10-CM | POA: Diagnosis not present

## 2013-12-15 DIAGNOSIS — D649 Anemia, unspecified: Secondary | ICD-10-CM | POA: Diagnosis not present

## 2013-12-15 DIAGNOSIS — N183 Chronic kidney disease, stage 3 unspecified: Secondary | ICD-10-CM

## 2013-12-15 DIAGNOSIS — I5043 Acute on chronic combined systolic (congestive) and diastolic (congestive) heart failure: Secondary | ICD-10-CM | POA: Diagnosis not present

## 2013-12-15 DIAGNOSIS — I5023 Acute on chronic systolic (congestive) heart failure: Secondary | ICD-10-CM

## 2013-12-15 DIAGNOSIS — I2510 Atherosclerotic heart disease of native coronary artery without angina pectoris: Secondary | ICD-10-CM | POA: Diagnosis not present

## 2013-12-15 DIAGNOSIS — I509 Heart failure, unspecified: Secondary | ICD-10-CM | POA: Diagnosis not present

## 2013-12-15 LAB — BASIC METABOLIC PANEL
BUN: 26 mg/dL — AB (ref 6–23)
CALCIUM: 8.2 mg/dL — AB (ref 8.4–10.5)
CO2: 28 mEq/L (ref 19–32)
CREATININE: 2.34 mg/dL — AB (ref 0.50–1.35)
Chloride: 100 mEq/L (ref 96–112)
GFR calc non Af Amer: 32 mL/min — ABNORMAL LOW (ref >90)
GFR, EST AFRICAN AMERICAN: 37 mL/min — AB (ref >90)
Glucose, Bld: 128 mg/dL — ABNORMAL HIGH (ref 70–99)
Potassium: 3.5 mEq/L — ABNORMAL LOW (ref 3.7–5.3)
Sodium: 142 mEq/L (ref 137–147)

## 2013-12-15 MED ORDER — POTASSIUM CHLORIDE CRYS ER 20 MEQ PO TBCR
40.0000 meq | EXTENDED_RELEASE_TABLET | Freq: Two times a day (BID) | ORAL | Status: DC
  Administered 2013-12-15 – 2013-12-18 (×6): 40 meq via ORAL
  Filled 2013-12-15 (×9): qty 2

## 2013-12-15 MED ORDER — ISOSORBIDE MONONITRATE ER 60 MG PO TB24
60.0000 mg | ORAL_TABLET | Freq: Every day | ORAL | Status: DC
  Administered 2013-12-16 – 2013-12-19 (×4): 60 mg via ORAL
  Filled 2013-12-15 (×4): qty 1

## 2013-12-15 MED ORDER — ISOSORBIDE MONONITRATE ER 30 MG PO TB24
30.0000 mg | ORAL_TABLET | Freq: Once | ORAL | Status: AC
Start: 2013-12-15 — End: 2013-12-15
  Administered 2013-12-15: 30 mg via ORAL
  Filled 2013-12-15: qty 1

## 2013-12-15 MED ORDER — ATORVASTATIN CALCIUM 40 MG PO TABS
40.0000 mg | ORAL_TABLET | Freq: Every day | ORAL | Status: DC
  Administered 2013-12-15 – 2013-12-18 (×4): 40 mg via ORAL
  Filled 2013-12-15 (×5): qty 1

## 2013-12-15 MED ORDER — ASPIRIN EC 81 MG PO TBEC
81.0000 mg | DELAYED_RELEASE_TABLET | Freq: Every day | ORAL | Status: DC
  Administered 2013-12-16 – 2013-12-19 (×4): 81 mg via ORAL
  Filled 2013-12-15 (×4): qty 1

## 2013-12-15 NOTE — Progress Notes (Signed)
Subjective:  Breathing better; pt admits to an acute sudden sob and fluid retention leading to admission.  Objective:   Vital Signs in the last 24 hours: Temp:  [98.4 F (36.9 C)-98.7 F (37.1 C)] 98.7 F (37.1 C) (04/14 1125) Pulse Rate:  [59-95] 65 (04/14 1125) Resp:  [13-25] 25 (04/14 1125) BP: (119-210)/(71-153) 138/75 mmHg (04/14 1125) SpO2:  [94 %-100 %] 94 % (04/14 1125) Weight:  [235 lb 9.6 oz (106.867 kg)-239 lb (108.41 kg)] 235 lb 9.6 oz (106.867 kg) (04/14 0500)  Intake/Output from previous day: 04/13 0701 - 04/14 0700 In: 608.3 [P.O.:200; I.V.:408.3] Out: 2900 [Urine:2900]  Medications: . aspirin EC  325 mg Oral Daily  . atorvastatin  40 mg Oral q1800  . budesonide-formoterol  2 puff Inhalation BID  . carvedilol  50 mg Oral BID WC  . furosemide  80 mg Intravenous BID  . heparin  5,000 Units Subcutaneous 3 times per day  . hydrALAZINE  50 mg Oral TID  . isosorbide mononitrate  30 mg Oral Daily  . potassium chloride SA  20 mEq Oral Daily  . ranolazine  500 mg Oral BID  . sodium chloride  3 mL Intravenous Q12H    . nitroGLYCERIN 20 mcg/min (12/15/13 1100)    Physical Exam:   General appearance: alert, cooperative and no distress Neck: no carotid bruit, supple, symmetrical, trachea midline, thyroid not enlarged, symmetric, no tenderness/mass/nodules and JVD ` 8 cm Lungs: no rales; decreased BS Heart: regular rate and rhythm and 1/6 , +s4 Abdomen: soft, non-tender; bowel sounds normal; no masses,  no organomegaly Extremities: 1-2+ pedal edema Skin: Skin color, texture, turgor normal. No rashes or lesions Neurologic: Grossly normal   Rate: 61  Rhythm: normal sinus rhythm  Lab Results:     Recent Labs  12/14/13 1215 12/14/13 1711 12/15/13 0326  NA 140  --  142  K 4.8  --  3.5*  CL 102  --  100  CO2 22  --  28  GLUCOSE 101*  --  128*  BUN 23  --  26*  CREATININE 2.13* 2.23* 2.34*   CBC    Component Value Date/Time   WBC 7.9 12/14/2013  1711   RBC 4.48 12/14/2013 1711   HGB 12.8* 12/14/2013 1711   HCT 39.8 12/14/2013 1711   PLT 248 12/14/2013 1711   MCV 88.8 12/14/2013 1711   MCH 28.6 12/14/2013 1711   MCHC 32.2 12/14/2013 1711   RDW 16.4* 12/14/2013 1711   LYMPHSABS 1.6 08/24/2013 0400   MONOABS 0.6 08/24/2013 0400   EOSABS 0.1 08/24/2013 0400   BASOSABS 0.0 08/24/2013 0400    No results found for this basename: TROPONINI, CK, MB,  in the last 72 hours Hepatic Function Panel No results found for this basename: PROT, ALBUMIN, AST, ALT, ALKPHOS, BILITOT, BILIDIR, IBILI,  in the last 72 hours No results found for this basename: INR,  in the last 72 hours BNP (last 3 results)  Recent Labs  07/24/13 1123 08/24/13 0400 12/14/13 1215  PROBNP 66682.0* >70000.0* 57585.0*    Lipid Panel     Component Value Date/Time   CHOL  Value: 121        ATP III CLASSIFICATION:  <200     mg/dL   Desirable  409-811200-239  mg/dL   Borderline High  >=914>=240    mg/dL   High        7/82/95626/22/2011 0520   TRIG 60 02/22/2010 0520   HDL 31* 02/22/2010 0520  CHOLHDL 3.9 02/22/2010 0520   VLDL 12 02/22/2010 0520   LDLCALC  Value: 78        Total Cholesterol/HDL:CHD Risk Coronary Heart Disease Risk Table                     Men   Women  1/2 Average Risk   3.4   3.3  Average Risk       5.0   4.4  2 X Average Risk   9.6   7.1  3 X Average Risk  23.4   11.0        Use the calculated Patient Ratio above and the CHD Risk Table to determine the patient's CHD Risk.        ATP III CLASSIFICATION (LDL):  <100     mg/dL   Optimal  828-003  mg/dL   Near or Above                    Optimal  130-159  mg/dL   Borderline  491-791  mg/dL   High  >505     mg/dL   Very High 6/97/9480 1655      Imaging:  Dg Chest 2 View  12/14/2013   CLINICAL DATA:  CHEST PAIN  EXAM: CHEST  2 VIEW  COMPARISON:  DG CHEST 1V PORT dated 11/17/2013  FINDINGS: Low lung volumes. Stable cardiomegaly. Views prominence of the interstitial markings and areas of peribronchial cuffing. Areas increased density  appreciated within the lung bases. Osseous structures unremarkable. Atherosclerotic calcifications are identified within the aorta.  IMPRESSION: Interstitial infiltrate likely reflecting component pulmonary edema  Focal infiltrates versus atelectasis within the lung bases right greater than left.  Stable cardiomegaly   Electronically Signed   By: Salome Holmes M.D.   On: 12/14/2013 13:01      Assessment/Plan:   Active Problems:   HYPERLIPIDEMIA   HYPERTENSION   CKD (chronic kidney disease), stage III   Shortness of breath   Acute on chronic combined systolic and diastolic CHF (congestive heart failure)   Hypertensive heart disease   Heart failure   CAD (coronary artery disease)  I/O -2291 yesterday and an additional -757 so far this am. Will wean and dc IV NTG and titrate imdur to 60 mg. Pt has baseline renal insufficiency. Will dc simvastatin with potential med interaction and change to atorvastatin 40 mg. Decrease ASA to 81 mg. Continue with IV lasix today.     Lennette Bihari, MD, [facility] 12/15/2013, 12:13 PM

## 2013-12-16 ENCOUNTER — Encounter: Payer: Medicare Other | Admitting: Cardiology

## 2013-12-16 DIAGNOSIS — I5023 Acute on chronic systolic (congestive) heart failure: Secondary | ICD-10-CM | POA: Diagnosis not present

## 2013-12-16 DIAGNOSIS — I5043 Acute on chronic combined systolic (congestive) and diastolic (congestive) heart failure: Secondary | ICD-10-CM | POA: Diagnosis not present

## 2013-12-16 DIAGNOSIS — N183 Chronic kidney disease, stage 3 unspecified: Secondary | ICD-10-CM | POA: Diagnosis not present

## 2013-12-16 DIAGNOSIS — I2510 Atherosclerotic heart disease of native coronary artery without angina pectoris: Secondary | ICD-10-CM | POA: Diagnosis not present

## 2013-12-16 LAB — BASIC METABOLIC PANEL
BUN: 26 mg/dL — AB (ref 6–23)
CALCIUM: 8.4 mg/dL (ref 8.4–10.5)
CHLORIDE: 98 meq/L (ref 96–112)
CO2: 30 mEq/L (ref 19–32)
CREATININE: 2.39 mg/dL — AB (ref 0.50–1.35)
GFR calc Af Amer: 36 mL/min — ABNORMAL LOW (ref >90)
GFR calc non Af Amer: 31 mL/min — ABNORMAL LOW (ref >90)
Glucose, Bld: 136 mg/dL — ABNORMAL HIGH (ref 70–99)
Potassium: 3.5 mEq/L — ABNORMAL LOW (ref 3.7–5.3)
Sodium: 141 mEq/L (ref 137–147)

## 2013-12-16 MED ORDER — HYDRALAZINE HCL 25 MG PO TABS
75.0000 mg | ORAL_TABLET | Freq: Three times a day (TID) | ORAL | Status: DC
  Administered 2013-12-16 – 2013-12-19 (×9): 75 mg via ORAL
  Filled 2013-12-16 (×12): qty 1

## 2013-12-16 MED ORDER — POTASSIUM CHLORIDE CRYS ER 20 MEQ PO TBCR
40.0000 meq | EXTENDED_RELEASE_TABLET | Freq: Once | ORAL | Status: AC
  Administered 2013-12-16: 40 meq via ORAL
  Filled 2013-12-16: qty 2

## 2013-12-16 MED ORDER — TORSEMIDE 20 MG PO TABS
40.0000 mg | ORAL_TABLET | Freq: Every day | ORAL | Status: DC
  Administered 2013-12-16 – 2013-12-19 (×4): 40 mg via ORAL
  Filled 2013-12-16 (×4): qty 2

## 2013-12-16 NOTE — Progress Notes (Signed)
Pharmacist Heart Failure Core Measure Documentation  Assessment: [name] has an EF documented as 15% on 05/10/13 by ECHO.  Rationale: Heart failure patients with left ventricular systolic dysfunction (LVSD) and an EF < 40% should be prescribed an angiotensin converting enzyme inhibitor (ACEI) or angiotensin receptor blocker (ARB) at discharge unless a contraindication is documented in the medical record.  This patient is not currently on an ACEI or ARB for HF.  This note is being placed in the record in order to provide documentation that a contraindication to the use of these agents is present for this encounter.  ACE Inhibitor or Angiotensin Receptor Blocker is contraindicated (specify all that apply)  []   ACEI allergy AND ARB allergy []   Angioedema []   Moderate or severe aortic stenosis []   Hyperkalemia []   Hypotension []   Renal artery stenosis [x]   Worsening renal function, preexisting renal disease or dysfunction   Hessie Diener [facility] 12/16/2013 10:10 AM

## 2013-12-16 NOTE — Progress Notes (Signed)
ERROR

## 2013-12-16 NOTE — Progress Notes (Signed)
Subjective:  Breathing better  Objective:   Vital Signs in the last 24 hours: Temp:  [97.5 F (36.4 C)-98.7 F (37.1 C)] 97.5 F (36.4 C) (04/15 0730) Pulse Rate:  [60-68] 60 (04/15 0320) Resp:  [12-25] 12 (04/15 0730) BP: (125-161)/(75-107) 126/87 mmHg (04/15 0730) SpO2:  [94 %-100 %] 99 % (04/15 0730) Weight:  [229 lb 0.9 oz (103.9 kg)] 229 lb 0.9 oz (103.9 kg) (04/15 0320)  Intake/Output from previous day: 04/14 0701 - 04/15 0700 In: 883 [P.O.:840; I.V.:43] Out: 4300 [Urine:4300]  Medications: . aspirin EC  81 mg Oral Daily  . atorvastatin  40 mg Oral q1800  . budesonide-formoterol  2 puff Inhalation BID  . carvedilol  50 mg Oral BID WC  . furosemide  80 mg Intravenous BID  . heparin  5,000 Units Subcutaneous 3 times per day  . hydrALAZINE  50 mg Oral TID  . isosorbide mononitrate  60 mg Oral Daily  . potassium chloride SA  40 mEq Oral BID  . ranolazine  500 mg Oral BID  . sodium chloride  3 mL Intravenous Q12H       Physical Exam:   General appearance: alert, cooperative and no distress Neck: no JVD, supple, symmetrical, trachea midline and thyroid not enlarged, symmetric, no tenderness/mass/nodules Lungs: improved aeration Heart: regular rate and rhythm and 1/6 sem, s4 Abdomen: soft, non-tender; bowel sounds normal; no masses,  no organomegaly Extremities: 1+ edema Neurologic: Grossly normal   Rate: 58  Rhythm: normal sinus rhythm  Lab Results:    Recent Labs  12/15/13 0326 12/16/13 0249  NA 142 141  K 3.5* 3.5*  CL 100 98  CO2 28 30  GLUCOSE 128* 136*  BUN 26* 26*  CREATININE 2.34* 2.39*   No results found for this basename: TROPONINI, CK, MB,  in the last 72 hours Hepatic Function Panel No results found for this basename: PROT, ALBUMIN, AST, ALT, ALKPHOS, BILITOT, BILIDIR, IBILI,  in the last 72 hours No results found for this basename: INR,  in the last 72 hours BNP (last 3 results)  Recent Labs  07/24/13 1123 08/24/13 0400  12/14/13 1215  PROBNP 66682.0* >70000.0* 57585.0*    Lipid Panel     Component Value Date/Time   CHOL  Value: 121        ATP III CLASSIFICATION:  <200     mg/dL   Desirable  376-283  mg/dL   Borderline High  >=151    mg/dL   High        7/61/6073 0520   TRIG 60 02/22/2010 0520   HDL 31* 02/22/2010 0520   CHOLHDL 3.9 02/22/2010 0520   VLDL 12 02/22/2010 0520   LDLCALC  Value: 78        Total Cholesterol/HDL:CHD Risk Coronary Heart Disease Risk Table                     Men   Women  1/2 Average Risk   3.4   3.3  Average Risk       5.0   4.4  2 X Average Risk   9.6   7.1  3 X Average Risk  23.4   11.0        Use the calculated Patient Ratio above and the CHD Risk Table to determine the patient's CHD Risk.        ATP III CLASSIFICATION (LDL):  <100     mg/dL   Optimal  710-626  mg/dL  Near or Above                    Optimal  130-159  mg/dL   Borderline  782-956160-189  mg/dL   High  >213>190     mg/dL   Very High 0/86/57846/22/2011 [id number]      Imaging:  Dg Chest 2 View  12/14/2013   CLINICAL DATA:  CHEST PAIN  EXAM: CHEST  2 VIEW  COMPARISON:  DG CHEST 1V PORT dated 11/17/2013  FINDINGS: Low lung volumes. Stable cardiomegaly. Views prominence of the interstitial markings and areas of peribronchial cuffing. Areas increased density appreciated within the lung bases. Osseous structures unremarkable. Atherosclerotic calcifications are identified within the aorta.  IMPRESSION: Interstitial infiltrate likely reflecting component pulmonary edema  Focal infiltrates versus atelectasis within the lung bases right greater than left.  Stable cardiomegaly   Electronically Signed   By: Salome HolmesHector  Cooper M.D.   On: 12/14/2013 13:01      Assessment/Plan:   Active Problems:   HYPERLIPIDEMIA   HYPERTENSION   CKD (chronic kidney disease), stage III   Shortness of breath   Acute on chronic combined systolic and diastolic CHF (congestive heart failure)   Hypertensive heart disease   Heart failure   CAD (coronary artery  disease)  I/O: -9528-6073 since admission; decrease 10 lbs since admission (239 to 229). Dilated CM with echo 2014 15% with history of medical and dietary noncompliance. Will titrate hydralazine to 75 mg tid. Change IV lasix to oral torsemide 40 mg. Bradycardic now on maximal dose coreg. Cr 2.39; therefore, no ACE-I or ARB. Will ultimately need repeat echo ? [employer]/ICD.    Lennette Biharihomas A. Kelly, MD, [facility] 12/16/2013, 11:20 AM

## 2013-12-17 DIAGNOSIS — I119 Hypertensive heart disease without heart failure: Secondary | ICD-10-CM | POA: Diagnosis not present

## 2013-12-17 DIAGNOSIS — I509 Heart failure, unspecified: Secondary | ICD-10-CM | POA: Diagnosis not present

## 2013-12-17 DIAGNOSIS — I2510 Atherosclerotic heart disease of native coronary artery without angina pectoris: Secondary | ICD-10-CM | POA: Diagnosis not present

## 2013-12-17 DIAGNOSIS — I5043 Acute on chronic combined systolic (congestive) and diastolic (congestive) heart failure: Secondary | ICD-10-CM | POA: Diagnosis not present

## 2013-12-17 LAB — BASIC METABOLIC PANEL
BUN: 26 mg/dL — ABNORMAL HIGH (ref 6–23)
CALCIUM: 8.9 mg/dL (ref 8.4–10.5)
CO2: 33 mEq/L — ABNORMAL HIGH (ref 19–32)
CREATININE: 2.32 mg/dL — AB (ref 0.50–1.35)
Chloride: 99 mEq/L (ref 96–112)
GFR calc Af Amer: 37 mL/min — ABNORMAL LOW (ref >90)
GFR, EST NON AFRICAN AMERICAN: 32 mL/min — AB (ref >90)
GLUCOSE: 161 mg/dL — AB (ref 70–99)
Potassium: 4.1 mEq/L (ref 3.7–5.3)
Sodium: 144 mEq/L (ref 137–147)

## 2013-12-17 MED ORDER — ACETAMINOPHEN 325 MG PO TABS
650.0000 mg | ORAL_TABLET | Freq: Four times a day (QID) | ORAL | Status: DC | PRN
  Administered 2013-12-17: 650 mg via ORAL
  Filled 2013-12-17: qty 2

## 2013-12-17 NOTE — Progress Notes (Signed)
Subjective:  No chest pain  Objective:   Vital Signs in the last 24 hours: Temp:  [97.6 F (36.4 C)-98.8 F (37.1 C)] 98.3 F (36.8 C) (04/16 1124) Pulse Rate:  [66-74] 70 (04/16 0300) Resp:  [16-20] 20 (04/16 1124) BP: (123-159)/(85-121) 139/87 mmHg (04/16 1124) SpO2:  [91 %-100 %] 98 % (04/16 1124) Weight:  [220 lb 0.3 oz (99.8 kg)] 220 lb 0.3 oz (99.8 kg) (04/16 0300)  Intake/Output from previous day: 04/15 0701 - 04/16 0700 In: 1152 [P.O.:1152] Out: 5525 [Urine:5525]  Medications: . aspirin EC  81 mg Oral Daily  . atorvastatin  40 mg Oral q1800  . budesonide-formoterol  2 puff Inhalation BID  . carvedilol  50 mg Oral BID WC  . furosemide  80 mg Intravenous BID  . heparin  5,000 Units Subcutaneous 3 times per day  . hydrALAZINE  75 mg Oral TID  . isosorbide mononitrate  60 mg Oral Daily  . potassium chloride SA  40 mEq Oral BID  . ranolazine  500 mg Oral BID  . sodium chloride  3 mL Intravenous Q12H  . torsemide  40 mg Oral Daily       Physical Exam:   General appearance: alert, cooperative and no distress Neck: no adenopathy, supple, symmetrical, trachea midline, thyroid not enlarged, symmetric, no tenderness/mass/nodules and JVD improved Lungs: improved aeration; no wheezing Heart: regular rate and rhythm and 1/6 sem; no s3 Abdomen: soft, non-tender; bowel sounds normal; no masses,  no organomegaly Extremities: 1+ edema Neurologic: Grossly normal   Rate: 70  Rhythm: normal sinus rhythm  Lab Results:    Recent Labs  12/16/13 0249 12/17/13 0300  NA 141 144  K 3.5* 4.1  CL 98 99  CO2 30 33*  GLUCOSE 136* 161*  BUN 26* 26*  CREATININE 2.39* 2.32*   No results found for this basename: TROPONINI, CK, MB,  in the last 72 hours Hepatic Function Panel No results found for this basename: PROT, ALBUMIN, AST, ALT, ALKPHOS, BILITOT, BILIDIR, IBILI,  in the last 72 hours No results found for this basename: INR,  in the last 72 hours BNP (last 3  results)  Recent Labs  07/24/13 1123 08/24/13 0400 12/14/13 1215  PROBNP 66682.0* >70000.0* 57585.0*    Lipid Panel     Component Value Date/Time   CHOL  Value: 121        ATP III CLASSIFICATION:  <200     mg/dL   Desirable  892-119  mg/dL   Borderline High  >=417    mg/dL   High        12/10/1446 0520   TRIG 60 02/22/2010 0520   HDL 31* 02/22/2010 0520   CHOLHDL 3.9 02/22/2010 0520   VLDL 12 02/22/2010 0520   LDLCALC  Value: 78        Total Cholesterol/HDL:CHD Risk Coronary Heart Disease Risk Table                     Men   Women  1/2 Average Risk   3.4   3.3  Average Risk       5.0   4.4  2 X Average Risk   9.6   7.1  3 X Average Risk  23.4   11.0        Use the calculated Patient Ratio above and the CHD Risk Table to determine the patient's CHD Risk.        ATP III CLASSIFICATION (LDL):  <100  mg/dL   Optimal  782-956100-129  mg/dL   Near or Above                    Optimal  130-159  mg/dL   Borderline  213-086160-189  mg/dL   High  >578>190     mg/dL   Very High 4/69/62956/22/2011 [id number]      Imaging:  No results found.    Assessment/Plan:   Active Problems:   HYPERLIPIDEMIA   HYPERTENSION   CKD (chronic kidney disease), stage III   Shortness of breath   Acute on chronic combined systolic and diastolic CHF (congestive heart failure)   Hypertensive heart disease   Heart failure   CAD (coronary artery disease)   I/O -32440-11272; diuresed -4373 yesterday, apparently still  received iv lasix; will dc and maintain on torsemide 40 mg. Weight 220, down 19 lbs since admission. Cr slightly improved but near baseline at 2.32. No on Ace-I due to renal. Hypertensive this am 154/121 when awakened from sleep. Tolerating ranolazine. Hydralazine at 75 mg tid, nitrates increased 60 mg daily. Will f/u BNP, electrolytes in am   Lennette Biharihomas A. Kelly, MD, [facility] 12/17/2013, 1:05 PM

## 2013-12-17 NOTE — Telephone Encounter (Signed)
completed

## 2013-12-18 DIAGNOSIS — I2510 Atherosclerotic heart disease of native coronary artery without angina pectoris: Secondary | ICD-10-CM | POA: Diagnosis not present

## 2013-12-18 DIAGNOSIS — I5043 Acute on chronic combined systolic (congestive) and diastolic (congestive) heart failure: Secondary | ICD-10-CM | POA: Diagnosis not present

## 2013-12-18 DIAGNOSIS — I5023 Acute on chronic systolic (congestive) heart failure: Secondary | ICD-10-CM | POA: Diagnosis not present

## 2013-12-18 DIAGNOSIS — N183 Chronic kidney disease, stage 3 unspecified: Secondary | ICD-10-CM | POA: Diagnosis not present

## 2013-12-18 MED ORDER — POTASSIUM CHLORIDE CRYS ER 20 MEQ PO TBCR
40.0000 meq | EXTENDED_RELEASE_TABLET | Freq: Every day | ORAL | Status: DC
  Administered 2013-12-19: 40 meq via ORAL
  Filled 2013-12-18: qty 2

## 2013-12-18 NOTE — Progress Notes (Signed)
Pt transferred from 2H, pt a/o, no c/o pain, pt oriented to unit, vss, pt stable

## 2013-12-18 NOTE — Progress Notes (Addendum)
Subjective:  No chest pain  Objective:   Vital Signs in the last 24 hours: Temp:  [97.6 F (36.4 C)-98.8 F (37.1 C)] 98.3 F (36.8 C) (04/17 1126) Pulse Rate:  [64-71] 66 (04/17 1126) Resp:  [18-22] 18 (04/17 0340) BP: (117-168)/(83-121) 117/83 mmHg (04/17 1126) SpO2:  [92 %-99 %] 97 % (04/17 1126) Weight:  [215 lb 2.7 oz (97.6 kg)] 215 lb 2.7 oz (97.6 kg) (04/17 0416)  Intake/Output from previous day: 04/16 0701 - 04/17 0700 In: 840 [P.O.:840] Out: 4050 [Urine:4050]  Medications: . aspirin EC  81 mg Oral Daily  . atorvastatin  40 mg Oral q1800  . budesonide-formoterol  2 puff Inhalation BID  . carvedilol  50 mg Oral BID WC  . heparin  5,000 Units Subcutaneous 3 times per day  . hydrALAZINE  75 mg Oral TID  . isosorbide mononitrate  60 mg Oral Daily  . potassium chloride SA  40 mEq Oral BID  . ranolazine  500 mg Oral BID  . sodium chloride  3 mL Intravenous Q12H  . torsemide  40 mg Oral Daily       Physical Exam:   General appearance: alert, cooperative and no distress Neck: no adenopathy, supple, symmetrical, trachea midline, thyroid not enlarged, symmetric, no tenderness/mass/nodules and JVD improved Lungs: improved aeration; no wheezing Heart: regular rate and rhythm and 1/6 sem; no s3 Abdomen: soft, non-tender; bowel sounds normal; no masses,  no organomegaly Extremities: previous edema essentially resolved Neurologic: Grossly normal   Rate: 65  Rhythm: normal sinus rhythm  Lab Results:     Recent Labs  12/16/13 0249 12/17/13 0300  NA 141 144  K 3.5* 4.1  CL 98 99  CO2 30 33*  GLUCOSE 136* 161*  BUN 26* 26*  CREATININE 2.39* 2.32*   No results found for this basename: TROPONINI, CK, MB,  in the last 72 hours Hepatic Function Panel No results found for this basename: PROT, ALBUMIN, AST, ALT, ALKPHOS, BILITOT, BILIDIR, IBILI,  in the last 72 hours No results found for this basename: INR,  in the last 72 hours BNP (last 3 results)  Recent  Labs  07/24/13 1123 08/24/13 0400 12/14/13 1215  PROBNP 66682.0* >70000.0* 57585.0*    Lipid Panel     Component Value Date/Time   CHOL  Value: 121        ATP III CLASSIFICATION:  <200     mg/dL   Desirable  768-115  mg/dL   Borderline High  >=726    mg/dL   High        10/07/5595 0520   TRIG 60 02/22/2010 0520   HDL 31* 02/22/2010 0520   CHOLHDL 3.9 02/22/2010 0520   VLDL 12 02/22/2010 0520   LDLCALC  Value: 78        Total Cholesterol/HDL:CHD Risk Coronary Heart Disease Risk Table                     Men   Women  1/2 Average Risk   3.4   3.3  Average Risk       5.0   4.4  2 X Average Risk   9.6   7.1  3 X Average Risk  23.4   11.0        Use the calculated Patient Ratio above and the CHD Risk Table to determine the patient's CHD Risk.        ATP III CLASSIFICATION (LDL):  <100     mg/dL  Optimal  100-129  mg/dL   Near or Above                    Optimal  130-159  mg/dL   Borderline  [id number]  mg/dL   High  >045>190     mg/dL   Very High 4/09/81196/22/2011 [id number]      Imaging:  No results found.    Assessment/Plan:   Active Problems:   HYPERLIPIDEMIA   HYPERTENSION   CKD (chronic kidney disease), stage III   Shortness of breath   Acute on chronic combined systolic and diastolic CHF (congestive heart failure)   Hypertensive heart disease   Heart failure   CAD (coronary artery disease)   I/O -14,151 since admission; yesterday -3210. Weight 215 today;  24 lbs weight loss since admission! Breathing much better. Significant noncompliant issues initially. Will eventually need to re-assess LV fxn following aggressive medical herapy, and consider candidacy for prophylactic ICD in future.  Will transfer to telemetry; Will f/u BNP in am; ? DC tomorrow if stable.   Lennette Biharihomas A. Hearl Heikes, MD, [facility] Hospital[facility] 12/18/2013, 12:00 PM

## 2013-12-19 LAB — BASIC METABOLIC PANEL
BUN: 31 mg/dL — AB (ref 6–23)
CALCIUM: 9.1 mg/dL (ref 8.4–10.5)
CO2: 29 meq/L (ref 19–32)
CREATININE: 2.6 mg/dL — AB (ref 0.50–1.35)
Chloride: 99 mEq/L (ref 96–112)
GFR calc Af Amer: 32 mL/min — ABNORMAL LOW (ref >90)
GFR calc non Af Amer: 28 mL/min — ABNORMAL LOW (ref >90)
GLUCOSE: 143 mg/dL — AB (ref 70–99)
Potassium: 4.2 mEq/L (ref 3.7–5.3)
Sodium: 141 mEq/L (ref 137–147)

## 2013-12-19 LAB — PRO B NATRIURETIC PEPTIDE: Pro B Natriuretic peptide (BNP): 11645 pg/mL — ABNORMAL HIGH (ref 0–125)

## 2013-12-19 MED ORDER — TORSEMIDE 20 MG PO TABS: 40.0000 mg | ORAL_TABLET | Freq: Every day | ORAL | Status: DC

## 2013-12-19 MED ORDER — ATORVASTATIN CALCIUM 40 MG PO TABS: 40.0000 mg | ORAL_TABLET | Freq: Every day | ORAL | Status: DC

## 2013-12-19 MED ORDER — ISOSORBIDE MONONITRATE ER 60 MG PO TB24: 60.0000 mg | ORAL_TABLET | Freq: Every day | ORAL | Status: DC

## 2013-12-19 MED ORDER — HYDRALAZINE HCL 50 MG PO TABS
75.0000 mg | ORAL_TABLET | Freq: Three times a day (TID) | ORAL | Status: DC
Start: 2013-12-19 — End: 2014-04-28

## 2013-12-19 MED ORDER — POTASSIUM CHLORIDE CRYS ER 20 MEQ PO TBCR: 40.0000 meq | EXTENDED_RELEASE_TABLET | Freq: Every day | ORAL | Status: DC

## 2013-12-19 NOTE — Plan of Care (Signed)
Problem: Phase I Progression Outcomes Goal: EF % per last Echo/documented,Core Reminder form on chart 15% as of 05/10/2013

## 2013-12-19 NOTE — Progress Notes (Signed)
    Primary cardiologist: Dr. Rollene Rotunda [facility])  Subjective:   Up in chair, breathing comfortably, ready to go home.   Objective:   Temp:  [97.4 F (36.3 C)-97.8 F (36.6 C)] 97.4 F (36.3 C) (04/18 0337) Pulse Rate:  [63-69] 63 (04/18 0923) Resp:  [18-20] 18 (04/18 0923) BP: (110-138)/(72-97) 110/72 mmHg (04/18 0923) SpO2:  [94 %-98 %] 96 % (04/18 1003) Weight:  [210 lb 15.7 oz (95.7 kg)-212 lb 11.9 oz (96.5 kg)] 210 lb 15.7 oz (95.7 kg) (04/18 0337) Last BM Date: 12/18/13  Filed Weights   12/18/13 0416 12/18/13 1506 12/19/13 0337  Weight: 215 lb 2.7 oz (97.6 kg) 212 lb 11.9 oz (96.5 kg) 210 lb 15.7 oz (95.7 kg)    Intake/Output Summary (Last 24 hours) at 12/19/13 1151 Last data filed at 12/19/13 0924  Gross per 24 hour  Intake   1383 ml  Output   1586 ml  Net   -203 ml    Telemetry: Sinus rhythm.  Exam:  General: Appears comfortable her rest.  Lungs: Clear, nonlabored.  Cardiac: RRR, no gallop.  Extremities: Trace edema.   Lab Results:  Basic Metabolic Panel:  Recent Labs Lab 12/16/13 0249 12/17/13 0300 12/19/13 0414  NA 141 144 141  K 3.5* 4.1 4.2  CL 98 99 99  CO2 30 33* 29  GLUCOSE 136* 161* 143*  BUN 26* 26* 31*  CREATININE 2.39* 2.32* 2.60*  CALCIUM 8.4 8.9 9.1    CBC:  Recent Labs Lab 12/14/13 1215 12/14/13 1711  WBC 8.7 7.9  HGB 12.8* 12.8*  HCT 40.2 39.8  MCV 87.4 88.8  PLT 248 248    BNP:  Recent Labs  08/24/13 0400 12/14/13 1215 12/19/13 0414  PROBNP >70000.0* 57585.0* 11645.0*     Medications:   Scheduled Medications: . aspirin EC  81 mg Oral Daily  . atorvastatin  40 mg Oral q1800  . budesonide-formoterol  2 puff Inhalation BID  . carvedilol  50 mg Oral BID WC  . heparin  5,000 Units Subcutaneous 3 times per day  . hydrALAZINE  75 mg Oral TID  . isosorbide mononitrate  60 mg Oral Daily  . potassium chloride SA  40 mEq Oral Daily  . ranolazine  500 mg Oral BID  . sodium chloride  3 mL Intravenous  Q12H  . torsemide  40 mg Oral Daily      PRN Medications:  sodium chloride, acetaminophen, nitroGLYCERIN, sodium chloride   Assessment:   1. Acute on chronic combined heart failure, LVEF 15% with grade 2 diastolic dysfunction as of September 2014.  2. CAD, managed medically.  3. Hypertension.  4. History of PSVT, quiescent.  5. 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   Plan/Discussion:    Significant diuresis during hospitalization, weight down from 235 pounds to 210 pounds. Creatinine is around baseline. States he feels much better. He was on Lasix as an outpatient, would discharge him on current regimen including Demadex. He will need followup within one week with BMET. Typically sees Dr. Antoine Poche in the [place] office, could be seen in [place] if necessary.   Jonelle Sidle, M.D., F.A.C.C.

## 2013-12-19 NOTE — Discharge Summary (Signed)
See rounding note.

## 2013-12-19 NOTE — Progress Notes (Signed)
Patient discharged to home.  IV removed prior to discharge; IV site clean, dry, and intact.  Discharged instructions, education, and medications discussed with patient prior to discharge using teach back method.  Patient voiced understanding of discharge information.

## 2013-12-19 NOTE — Discharge Summary (Signed)
Physician Discharge Summary  Patient ID: Caydin Hearns MRN: [id number] DOB/AGE: 04-20-1966 48 y.o.  Primary Cardiologist: Hochrien  Admit date: 12/14/2013 Discharge date: 12/19/2013  Admission Diagnoses: Acute on Chronic Combined Systolic and Diastolic CHF.   Discharge Diagnoses:  Active Problems:   HYPERLIPIDEMIA   HYPERTENSION   CKD (chronic kidney disease), stage III   Shortness of breath   Acute on chronic combined systolic and diastolic CHF (congestive heart failure)   Hypertensive heart disease   Heart failure   CAD (coronary artery disease)   Discharged Condition: stable  Hospital Course: HPI: Mr. Lollie is a 48 y/o male, followed by Dr. Santa Nella Lions, with a history of mixed ischemic and nonischemic heart disease, with an EF of 15% on most recent 2D echo in September 2014. He has had multiple admissions for acute on chronic CHF related to hypertensive urgency, in the setting of medical noncompliance. His last hospital admission for CHF was in September 2014. At that time, he was diuresed to a dry weight of 222 lbs. He was discharged on 40 mg of Lasix daily, 25 mg of Carvedilol, 25 mg of hydralazine TID and 30 mg of Imdur. The patient is not a candidate for an ACE inhibitor secondary to renal insufficiency. Baseline SCr ~2.5.   He presented to the [facility] ER 12/14/13 with a complaint of SOB and bilateral LEE. He reported compliance with his medications, including Lasix and denied dietary indiscretion.   In the ER, his BNP was elevated at 57, 585. CXR was consistent with CHF. BP was elevated at 187/140. His weight on arrival was  239 lbs. He was admitted to stepdown and placed on IV diuretics. His Hydralazine was increased and he was started on IV NTG for BP control. Cardiac enzymes were cycled and were negative x 3. He had good diuresis. He was diuresed down to a dry weight of 210 lbs. His BP improved. His SOB resolved as did his LEE. He was transitioned to PO Lasix. His home Lasix was  replaced with 40 mg of torsemide daily. He was last seen and examined by Dr. Diona Browner who determined he was stable for discharge home. He was instructed to get a BMP to assess renal function and electrolytes in 1 week. Follow-up will be arranged with Dr. Barwick Lions in [place].    Consults: None  Significant Diagnostic Studies: none  Treatments: See Hospital Course  Discharge Exam: Blood pressure 110/72, pulse 63, temperature 97.4 F (36.3 C), temperature source Oral, resp. rate 18, height 5\' 10"  (1.778 m), weight 210 lb 15.7 oz (95.7 kg), SpO2 96.00%.   Disposition: 01-Home or Self Care      Discharge Orders   Future Orders Complete By Expires   Diet - low sodium heart healthy  As directed    Increase activity slowly  As directed        Medication List    STOP taking these medications       furosemide 40 MG tablet  Commonly known as:  LASIX     pravastatin 40 MG tablet  Commonly known as:  PRAVACHOL      TAKE these medications       aspirin 325 MG EC tablet  Take 325 mg by mouth daily.     atorvastatin 40 MG tablet  Commonly known as:  LIPITOR  Take 1 tablet (40 mg total) by mouth daily at 6 PM.     calcitRIOL 0.25 MCG capsule  Commonly known as:  ROCALTROL  Take 0.25 mcg by  mouth 3 (three) times a week. On Monday, Wednesday, and Friday.     carvedilol 25 MG tablet  Commonly known as:  COREG  Take 50 mg by mouth 2 (two) times daily with a meal.     cholecalciferol 1000 UNITS tablet  Commonly known as:  VITAMIN D  Take 1,000 Units by mouth daily.     hydrALAZINE 50 MG tablet  Commonly known as:  APRESOLINE  Take 1.5 tablets (75 mg total) by mouth 3 (three) times daily.     HYDROcodone-acetaminophen 5-325 MG per tablet  Commonly known as:  NORCO/VICODIN  Take 1 tablet by mouth every 4 (four) hours as needed for moderate pain.     isosorbide mononitrate 60 MG 24 hr tablet  Commonly known as:  IMDUR  Take 1 tablet (60 mg total) by mouth daily.      nitroGLYCERIN 0.4 MG SL tablet  Commonly known as:  NITROSTAT  Place 1 tablet (0.4 mg total) under the tongue every 5 (five) minutes as needed for chest pain.     potassium chloride SA 20 MEQ tablet  Commonly known as:  K-DUR,KLOR-CON  Take 2 tablets (40 mEq total) by mouth daily.     ranolazine 500 MG 12 hr tablet  Commonly known as:  RANEXA  Take 1 tablet (500 mg total) by mouth 2 (two) times daily.     SYMBICORT 160-4.5 MCG/ACT inhaler  Generic drug:  budesonide-formoterol  Inhale 2 puffs into the lungs 2 (two) times daily.     torsemide 20 MG tablet  Commonly known as:  DEMADEX  Take 2 tablets (40 mg total) by mouth daily.       TIME SPENT ON DISCHARGE, INCLUDING PHYSICIAN TIME: >30 MINUTES  Signed: Robbie LisBrittainy Simmons 12/19/2013, 1:29 PM

## 2014-01-12 ENCOUNTER — Ambulatory Visit: Payer: Medicare Other | Admitting: Cardiology

## 2014-01-18 ENCOUNTER — Encounter: Payer: Self-pay | Admitting: Cardiology

## 2014-01-18 ENCOUNTER — Other Ambulatory Visit: Payer: Self-pay | Admitting: *Deleted

## 2014-01-18 ENCOUNTER — Ambulatory Visit (INDEPENDENT_AMBULATORY_CARE_PROVIDER_SITE_OTHER): Payer: Medicare Other | Admitting: Cardiology

## 2014-01-18 VITALS — BP 132/98 | HR 62 | Ht 70.0 in | Wt 221.8 lb

## 2014-01-18 DIAGNOSIS — I119 Hypertensive heart disease without heart failure: Secondary | ICD-10-CM

## 2014-01-18 DIAGNOSIS — I10 Essential (primary) hypertension: Secondary | ICD-10-CM

## 2014-01-18 DIAGNOSIS — I2510 Atherosclerotic heart disease of native coronary artery without angina pectoris: Secondary | ICD-10-CM

## 2014-01-18 DIAGNOSIS — Z79899 Other long term (current) drug therapy: Secondary | ICD-10-CM

## 2014-01-18 LAB — BASIC METABOLIC PANEL
BUN: 26 mg/dL — ABNORMAL HIGH (ref 6–23)
CALCIUM: 8.7 mg/dL (ref 8.4–10.5)
CO2: 28 mEq/L (ref 19–32)
Chloride: 103 mEq/L (ref 96–112)
Creatinine, Ser: 2.7 mg/dL — ABNORMAL HIGH (ref 0.4–1.5)
GFR: 32.48 mL/min — ABNORMAL LOW (ref >60.00)
GLUCOSE: 97 mg/dL (ref 70–99)
POTASSIUM: 3.6 meq/L (ref 3.5–5.1)
Sodium: 139 mEq/L (ref 135–145)

## 2014-01-18 MED ORDER — NITROGLYCERIN 0.4 MG SL SUBL: 0.4000 mg | SUBLINGUAL_TABLET | SUBLINGUAL | Status: DC | PRN

## 2014-01-18 NOTE — Patient Instructions (Signed)
You may take an extra 10 mg of Demadex for a weight gain of 2 lbs over night. Continue all medications as listed.  Please have blood work today (BMP)  Follow up in 4 months with Dr Antoine Poche in the [place] office.

## 2014-01-18 NOTE — Progress Notes (Signed)
HPI Patient presents for followup after recent hospitalization for volume overload. This hospitalization was again proceeded by noncompliance. He presented with hypertensive urgency with volume overload and he was up about 25 pounds. He was diureses then discharged to home for now as his sister employed as his home health. For now this is greatly improving his situation. His weights are maintaining around 215 pounds which was his dry weight. He says he's feeling well. He's not having any shortness of breath, PND or orthopnea. He's not having any edema. He has no chest pressure, neck or arm discomfort.  No Known Allergies  Current Outpatient Prescriptions  Medication Sig Dispense Refill  . aspirin 325 MG EC tablet Take 325 mg by mouth daily.      Marland Kitchen atorvastatin (LIPITOR) 40 MG tablet Take 1 tablet (40 mg total) by mouth daily at 6 PM.  30 tablet  5  . budesonide-formoterol (SYMBICORT) 160-4.5 MCG/ACT inhaler Inhale 2 puffs into the lungs 2 (two) times daily.      . calcitRIOL (ROCALTROL) 0.25 MCG capsule Take 0.25 mcg by mouth 3 (three) times a week. On Monday, Wednesday, and Friday.      . carvedilol (COREG) 25 MG tablet Take 50 mg by mouth 2 (two) times daily with a meal.      . cholecalciferol (VITAMIN D) 1000 UNITS tablet Take 1,000 Units by mouth daily.      . hydrALAZINE (APRESOLINE) 50 MG tablet Take 1.5 tablets (75 mg total) by mouth 3 (three) times daily.  135 tablet  6  . HYDROcodone-acetaminophen (NORCO/VICODIN) 5-325 MG per tablet Take 1 tablet by mouth every 4 (four) hours as needed for moderate pain.  15 tablet  0  . isosorbide mononitrate (IMDUR) 60 MG 24 hr tablet Take 1 tablet (60 mg total) by mouth daily.  30 tablet  5  . nitroGLYCERIN (NITROSTAT) 0.4 MG SL tablet Place 1 tablet (0.4 mg total) under the tongue every 5 (five) minutes as needed for chest pain.  25 tablet  0  . potassium chloride SA (K-DUR,KLOR-CON) 20 MEQ tablet Take 2 tablets (40 mEq total) by mouth daily.  60  tablet  5  . ranolazine (RANEXA) 500 MG 12 hr tablet Take 1 tablet (500 mg total) by mouth 2 (two) times daily.  60 tablet  3  . torsemide (DEMADEX) 20 MG tablet Take 2 tablets (40 mg total) by mouth daily.  60 tablet  5   No current facility-administered medications for this visit.    Past Medical History  Diagnosis Date  . CAD (coronary artery disease)     a. NSTEMI 2008: in setting of profound epistaxis/anemia, cath showing severe diffuse RCA dz with L-R collaterals, nonobst dz in left system, managed medically. b. Type II NSTEMI 10/2008. c. Last cath 2012 - for med rx. d. 05/2013: elevated troponin ? due to ARF.  e. Ranexa added 03/2012.  Marland Kitchen Hypertension     a. Difficult to control. b. Renal duplex neg for RAS 11/2012.  Marland Kitchen Hyperlipidemia   . Chronic kidney disease     a. Average creatine of about 2.3  . Cardiomyopathy     a. Mixed ICM/NICM - Prior EF 25% back to 2008. b. Improved to 55% in 2012. c. Subsequent echoes: 45% in 11/2010, 15% by echo 05/2013.  Marland Kitchen Anemia     a. 2008 in setting of profound epistaxis.  Marland Kitchen History of pneumonia 08/2012, 05/2013  . Epistaxis     a. 2008, 2009 a/w significant  anemia.  Marland Kitchen. SVT (supraventricular tachycardia)     a. 05/2013 tx with 6mg  adenosine in ER.  . CHF (congestive heart failure)     EF 15%    Past Surgical History  Procedure Laterality Date  . Cardiac catheterization  2012    total RCA occlusion with left-to-right collaterals supplying the distal RCA branches, mild diffuse nonobstructive disease of LAD and LCx. Medically managed    ROS:  As stated in the HPI and negative for all other systems.  PHYSICAL EXAM BP 132/98  Pulse 62  Ht 5\' 10"  (1.778 m)  Wt 221 lb 12.8 oz (100.608 kg)  BMI 31.83 kg/m2  SpO2 97% GENERAL:  Well appearing NECK:  No jugular venous distention, waveform within normal limits, carotid upstroke brisk and symmetric, no bruits, no thyromegaly LUNGS:  Clear to auscultation bilaterally BACK:  No CVA tenderness CHEST:   Unremarkable HEART:  PMI not displaced or sustained,S1 and S2 within normal limits, no S3, no S4, no clicks, no rubs, no murmurs ABD:  Flat, positive bowel sounds normal in frequency in pitch, no bruits, no rebound, no guarding, no midline pulsatile mass, no hepatomegaly, no splenomegaly EXT:  2 plus pulses throughout, no edema, no cyanosis no clubbing  ASSESSMENT AND PLAN  CAD -  Given the absence of symptoms no change in therapy is indicated. No further imaging is indicated.  CHRONIC SYSTOLIC HEART FAILURE -  He seems to be euvolemic. He will remain on the meds as listed. Compliance is the entire issue.  CHRONIC KIDNEY DISEASE UNSPECIFIED - He is followed also by nephrology.  I will check a creatinine today.  HYPERTENSION - His blood pressure is well controlled on this regimen. Continue with this.  I reviewed his hospital records.

## 2014-01-25 ENCOUNTER — Other Ambulatory Visit (HOSPITAL_COMMUNITY): Payer: Self-pay | Admitting: Cardiology

## 2014-01-26 NOTE — Telephone Encounter (Signed)
Refill refused. Requested too soon

## 2014-02-12 ENCOUNTER — Telehealth: Payer: Self-pay | Admitting: Cardiology

## 2014-02-12 NOTE — Telephone Encounter (Signed)
Erica  needed a working number to be able to call the pt. The number given was not in service , I  Marcelle Overlie the pharmacy numbers where he gets his meds filled to see if they had a working phone number, she said she would try there

## 2014-02-12 NOTE — Telephone Encounter (Signed)
Follow Up   Returning call. States she aware of mess from earlier. OK to call back for further clarification.

## 2014-02-12 NOTE — Telephone Encounter (Signed)
New message     Need clarification  Order written on  5/18 . All it's states is pulse ox .

## 2014-02-12 NOTE — Telephone Encounter (Signed)
Left message for [name] that pt needs a pulse ox to be able to check his HR and 02 sats at home.  Requested she call back with further questions

## 2014-02-12 NOTE — Telephone Encounter (Signed)
Alcario Drought called back stating the phone number she has for the pt has been disconnected.  She was given the phone number to the pharmacy that the pt uses since they usually have the most up to date telephone number.

## 2014-03-29 ENCOUNTER — Other Ambulatory Visit: Payer: Self-pay

## 2014-03-29 MED ORDER — CARVEDILOL 25 MG PO TABS: 50.0000 mg | ORAL_TABLET | Freq: Two times a day (BID) | ORAL | Status: DC

## 2014-03-29 MED ORDER — RANOLAZINE ER 500 MG PO TB12: 500.0000 mg | ORAL_TABLET | Freq: Two times a day (BID) | ORAL | Status: DC

## 2014-03-29 MED ORDER — NITROGLYCERIN 0.4 MG SL SUBL: 0.4000 mg | SUBLINGUAL_TABLET | SUBLINGUAL | Status: DC | PRN

## 2014-04-02 ENCOUNTER — Telehealth: Payer: Self-pay | Admitting: *Deleted

## 2014-04-02 ENCOUNTER — Other Ambulatory Visit: Payer: Self-pay

## 2014-04-02 MED ORDER — NITROGLYCERIN 0.4 MG SL SUBL: 0.4000 mg | SUBLINGUAL_TABLET | SUBLINGUAL | Status: DC | PRN

## 2014-04-02 MED ORDER — CARVEDILOL 25 MG PO TABS: 50.0000 mg | ORAL_TABLET | Freq: Two times a day (BID) | ORAL | Status: DC

## 2014-04-02 NOTE — Telephone Encounter (Signed)
[facility] pharmacy called and wanted to verify that the patients current coreg dose of 50mg  bid is correct. They stated that this is the first time that it has been sent in this way. I tried to reach the patient to discuss, but the phone number listed doesn't work. Please advise. Thanks, MI

## 2014-04-06 NOTE — Telephone Encounter (Signed)
Thanks for talking with me, it looks like this pts coreg has already been looked into but i just wanted to run it by you, I hope your having a good day, let me know if you have any more tip and tricks to work with Dr Antoine Poche, or if you know any words in swahili

## 2014-04-06 NOTE — Telephone Encounter (Signed)
Looks ok and probably has been cleared up but just wanted to be sure about his coreg dose which 50 mg bid, I guess the pharmacist had a question, thanks for talking with me , I hope your having a good day

## 2014-04-08 ENCOUNTER — Other Ambulatory Visit: Payer: Self-pay | Admitting: *Deleted

## 2014-04-08 MED ORDER — RANOLAZINE ER 500 MG PO TB12: 500.0000 mg | ORAL_TABLET | Freq: Two times a day (BID) | ORAL | Status: DC

## 2014-04-28 ENCOUNTER — Other Ambulatory Visit: Payer: Self-pay | Admitting: *Deleted

## 2014-04-28 MED ORDER — HYDRALAZINE HCL 50 MG PO TABS: 75.0000 mg | ORAL_TABLET | Freq: Three times a day (TID) | ORAL | Status: DC

## 2014-05-05 ENCOUNTER — Other Ambulatory Visit: Payer: Self-pay

## 2014-05-05 MED ORDER — CARVEDILOL 25 MG PO TABS: 50.0000 mg | ORAL_TABLET | Freq: Two times a day (BID) | ORAL | Status: DC

## 2014-05-19 ENCOUNTER — Ambulatory Visit (INDEPENDENT_AMBULATORY_CARE_PROVIDER_SITE_OTHER): Payer: Medicare Other | Admitting: Cardiology

## 2014-05-19 ENCOUNTER — Encounter: Payer: Self-pay | Admitting: Cardiology

## 2014-05-19 VITALS — BP 140/96 | HR 64 | Ht 70.0 in | Wt 210.0 lb

## 2014-05-19 DIAGNOSIS — I5022 Chronic systolic (congestive) heart failure: Secondary | ICD-10-CM | POA: Diagnosis not present

## 2014-05-19 DIAGNOSIS — I2510 Atherosclerotic heart disease of native coronary artery without angina pectoris: Secondary | ICD-10-CM | POA: Diagnosis not present

## 2014-05-19 NOTE — Progress Notes (Signed)
HPI Patient presents for followup coronary disease and cardiomyopathy. He's had very difficult to control hypertension. However, now is doing very well. His 3 daughters living with him and he is enjoying this situation. The patient denies any new symptoms such as chest discomfort, neck or arm discomfort. There has been no new shortness of breath, PND or orthopnea. There have been no reported palpitations, presyncope or syncope.  No Known Allergies  Current Outpatient Prescriptions  Medication Sig Dispense Refill  . aspirin 325 MG EC tablet Take 325 mg by mouth daily.      Marland Kitchen atorvastatin (LIPITOR) 40 MG tablet Take 1 tablet (40 mg total) by mouth daily at 6 PM.  30 tablet  5  . budesonide-formoterol (SYMBICORT) 160-4.5 MCG/ACT inhaler Inhale 2 puffs into the lungs 2 (two) times daily.      . calcitRIOL (ROCALTROL) 0.25 MCG capsule Take 0.25 mcg by mouth 3 (three) times a week. On Monday, Wednesday, and Friday.      . carvedilol (COREG) 25 MG tablet Take 2 tablets (50 mg total) by mouth 2 (two) times daily with a meal.  120 tablet  0  . cholecalciferol (VITAMIN D) 1000 UNITS tablet Take 1,000 Units by mouth daily.      . hydrALAZINE (APRESOLINE) 50 MG tablet Take 1.5 tablets (75 mg total) by mouth 3 (three) times daily.  135 tablet  0  . HYDROcodone-acetaminophen (NORCO/VICODIN) 5-325 MG per tablet Take 1 tablet by mouth every 4 (four) hours as needed for moderate pain.  15 tablet  0  . isosorbide mononitrate (IMDUR) 60 MG 24 hr tablet Take 1 tablet (60 mg total) by mouth daily.  30 tablet  5  . nitroGLYCERIN (NITROSTAT) 0.4 MG SL tablet Place 1 tablet (0.4 mg total) under the tongue every 5 (five) minutes as needed for chest pain.  25 tablet  3  . potassium chloride SA (K-DUR,KLOR-CON) 20 MEQ tablet Take 2 tablets (40 mEq total) by mouth daily.  60 tablet  5  . ranolazine (RANEXA) 500 MG 12 hr tablet Take 1 tablet (500 mg total) by mouth 2 (two) times daily.  60 tablet  1  . torsemide (DEMADEX)  20 MG tablet Take 2 tablets (40 mg total) by mouth daily.  60 tablet  5   No current facility-administered medications for this visit.    Past Medical History  Diagnosis Date  . CAD (coronary artery disease)     a. NSTEMI 2008: in setting of profound epistaxis/anemia, cath showing severe diffuse RCA dz with L-R collaterals, nonobst dz in left system, managed medically. b. Type II NSTEMI 10/2008. c. Last cath 2012 - for med rx. d. 05/2013: elevated troponin ? due to ARF.  e. Ranexa added 03/2012.  Marland Kitchen Hypertension     a. Difficult to control. b. Renal duplex neg for RAS 11/2012.  Marland Kitchen Hyperlipidemia   . Chronic kidney disease     a. Average creatine of about 2.3  . Cardiomyopathy     a. Mixed ICM/NICM - Prior EF 25% back to 2008. b. Improved to 55% in 2012. c. Subsequent echoes: 45% in 11/2010, 15% by echo 05/2013.  Marland Kitchen Anemia     a. 2008 in setting of profound epistaxis.  Marland Kitchen History of pneumonia 08/2012, 05/2013  . Epistaxis     a. 2008, 2009 a/w significant anemia.  Marland Kitchen SVT (supraventricular tachycardia)     a. 05/2013 tx with 6mg  adenosine in ER.  . CHF (congestive heart failure)  EF 15%    Past Surgical History  Procedure Laterality Date  . Cardiac catheterization  2012    total RCA occlusion with left-to-right collaterals supplying the distal RCA branches, mild diffuse nonobstructive disease of LAD and LCx. Medically managed    ROS:  As stated in the HPI and negative for all other systems.  PHYSICAL EXAM BP 140/96  Pulse 64  Ht  (1.778 m)  Wt 210 lb (95.255 kg)  BMI 30.13 kg/m2 GENERAL:  Well appearing NECK:  No jugular venous distention, waveform within normal limits, carotid upstroke brisk and symmetric, no bruits, no thyromegaly LUNGS:  Clear to auscultation bilaterally BACK:  No CVA tenderness CHEST:  Unremarkable HEART:  PMI not displaced or sustained,S1 and S2 within normal limits, no S3, no S4, no clicks, no rubs, no murmurs ABD:  Flat, positive bowel sounds normal in  frequency in pitch, no bruits, no rebound, no guarding, no midline pulsatile mass, no hepatomegaly, no splenomegaly EXT:  2 plus pulses throughout, no edema, no cyanosis no clubbing  ASSESSMENT AND PLAN  CAD -  Given the absence of symptoms no change in therapy is indicated. No further imaging is indicated.  CHRONIC SYSTOLIC HEART FAILURE -  He seems to be euvolemic. He will remain on the meds as listed. Compliance is the entire issue.  CHRONIC KIDNEY DISEASE UNSPECIFIED - He is followed also by nephrology.    HYPERTENSION - His blood pressure is well controlled on this regimen. Continue with this.

## 2014-05-19 NOTE — Patient Instructions (Signed)
The current medical regimen is effective;  continue present plan and medications.  Follow up in 6 months with Dr Hochrein.  You will receive a letter in the mail 2 months before you are due.  Please call us when you receive this letter to schedule your follow up appointment.  

## 2014-05-31 ENCOUNTER — Telehealth: Payer: Self-pay | Admitting: Cardiology

## 2014-05-31 NOTE — Telephone Encounter (Signed)
New message     Unable to get coreg 25mg ----brand or generic.  Can he get 6.25mg  until they can get the 25mg ?

## 2014-06-03 NOTE — Telephone Encounter (Signed)
Called and informed that it was ok to use the 6.25 mg dose

## 2014-06-09 ENCOUNTER — Other Ambulatory Visit: Payer: Self-pay

## 2014-06-09 MED ORDER — RANOLAZINE ER 500 MG PO TB12: 500.0000 mg | ORAL_TABLET | Freq: Two times a day (BID) | ORAL | Status: DC

## 2014-06-11 ENCOUNTER — Other Ambulatory Visit: Payer: Self-pay | Admitting: *Deleted

## 2014-06-11 MED ORDER — CARVEDILOL 25 MG PO TABS: 50.0000 mg | ORAL_TABLET | Freq: Two times a day (BID) | ORAL | Status: DC

## 2014-06-11 MED ORDER — HYDRALAZINE HCL 50 MG PO TABS: 75.0000 mg | ORAL_TABLET | Freq: Three times a day (TID) | ORAL | Status: DC

## 2014-07-09 ENCOUNTER — Other Ambulatory Visit: Payer: Self-pay | Admitting: *Deleted

## 2014-07-09 MED ORDER — POTASSIUM CHLORIDE CRYS ER 20 MEQ PO TBCR: 40.0000 meq | EXTENDED_RELEASE_TABLET | Freq: Every day | ORAL | Status: DC

## 2014-07-13 ENCOUNTER — Other Ambulatory Visit: Payer: Self-pay | Admitting: *Deleted

## 2014-07-13 MED ORDER — ATORVASTATIN CALCIUM 40 MG PO TABS: 40.0000 mg | ORAL_TABLET | Freq: Every day | ORAL | Status: DC

## 2014-07-13 MED ORDER — ISOSORBIDE MONONITRATE ER 60 MG PO TB24: 60.0000 mg | ORAL_TABLET | Freq: Every day | ORAL | Status: DC

## 2014-07-13 MED ORDER — TORSEMIDE 20 MG PO TABS: 40.0000 mg | ORAL_TABLET | Freq: Every day | ORAL | Status: DC

## 2014-08-18 ENCOUNTER — Other Ambulatory Visit: Payer: Self-pay | Admitting: *Deleted

## 2014-08-18 MED ORDER — NITROGLYCERIN 0.4 MG SL SUBL: 0.4000 mg | SUBLINGUAL_TABLET | SUBLINGUAL | Status: DC | PRN

## 2014-08-18 MED ORDER — RANOLAZINE ER 500 MG PO TB12: 500.0000 mg | ORAL_TABLET | Freq: Two times a day (BID) | ORAL | Status: DC

## 2014-08-24 ENCOUNTER — Ambulatory Visit (HOSPITAL_COMMUNITY)
Admission: RE | Admit: 2014-08-24 | Discharge: 2014-08-24 | Disposition: A | Discharge status: Home / Self Care | Payer: Medicare Other | Source: Ambulatory Visit | Attending: Family Medicine | Admitting: Family Medicine

## 2014-08-24 ENCOUNTER — Other Ambulatory Visit (HOSPITAL_COMMUNITY): Payer: Self-pay | Admitting: Family Medicine

## 2014-08-24 DIAGNOSIS — I272 Other secondary pulmonary hypertension: Secondary | ICD-10-CM | POA: Diagnosis not present

## 2014-08-24 DIAGNOSIS — I517 Cardiomegaly: Secondary | ICD-10-CM | POA: Insufficient documentation

## 2014-08-24 DIAGNOSIS — R059 Cough, unspecified: Secondary | ICD-10-CM

## 2014-08-24 DIAGNOSIS — R05 Cough: Secondary | ICD-10-CM | POA: Diagnosis present

## 2014-10-01 IMAGING — CT CT HEAD W/O CM
1 series · 16 of 28 positions shown, 20 images · non-contrast
Comparison: 03/28/2010

CLINICAL DATA: Temperature for 2 days.  Fall earlier today,
striking nose on the carpet.  Headache.  Cough with white sputum.

CT HEAD WITHOUT CONTRAST
TECHNIQUE: Contiguous axial images were obtained from the base of
the skull through the vertex without contrast.

[Series 2: head 5.0 h30s · axial · 0.43mm/px · z∈[+1285,+1410]mm · 16 of 28 slices shown, 20 images]
[im 2/28  brain]
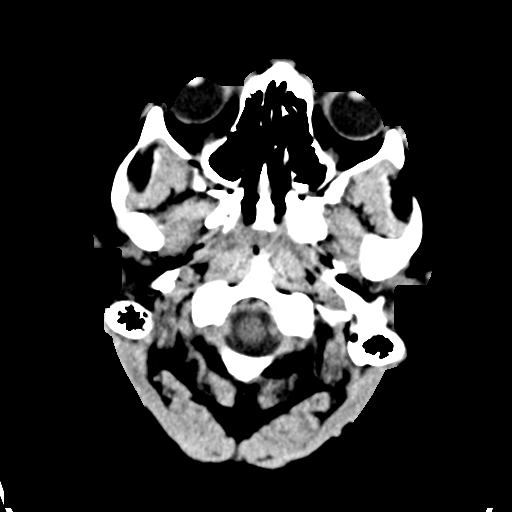
[im 2/28  bone]
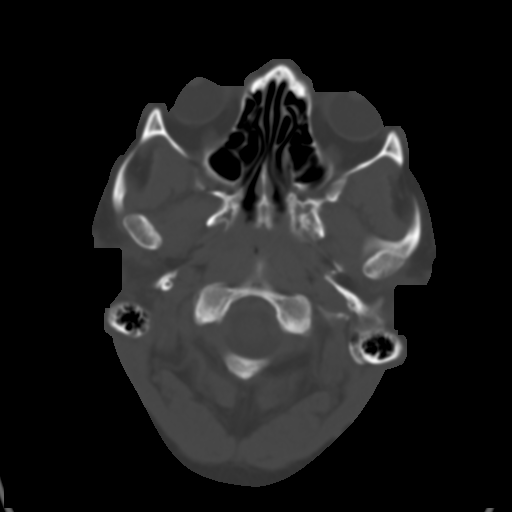
[im 4/28  brain]
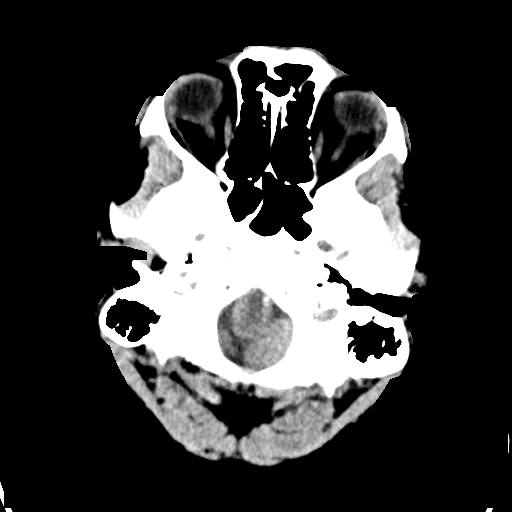
[im 6/28  brain]
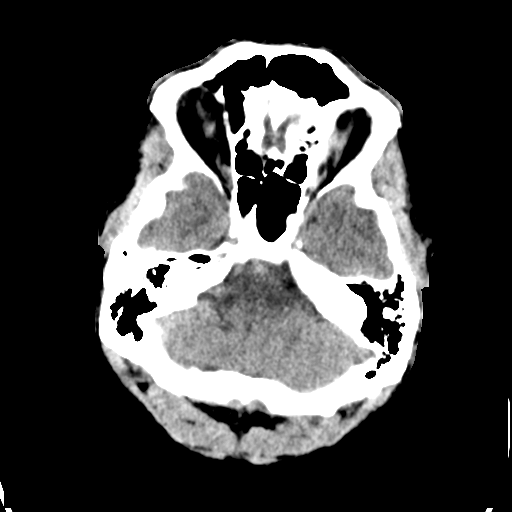
[im 7/28  brain]
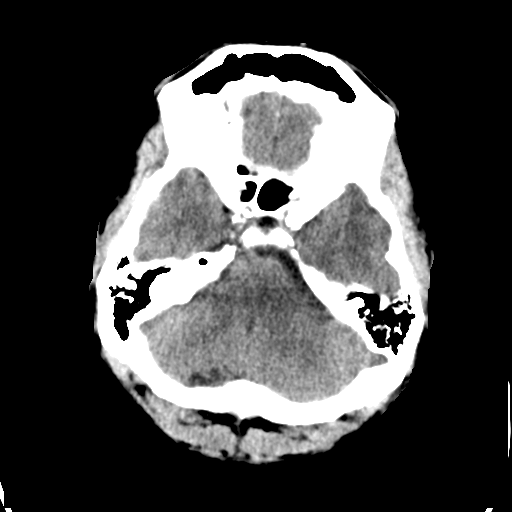
[im 9/28  brain]
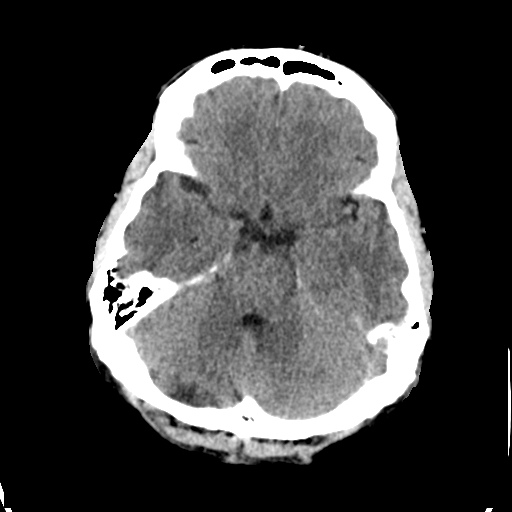
[im 9/28  bone]
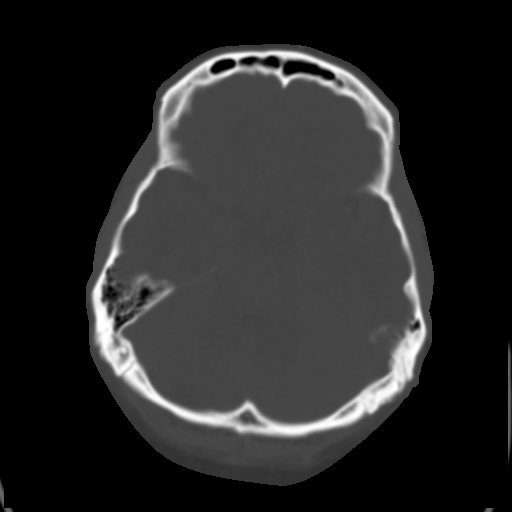
[im 10/28  brain]
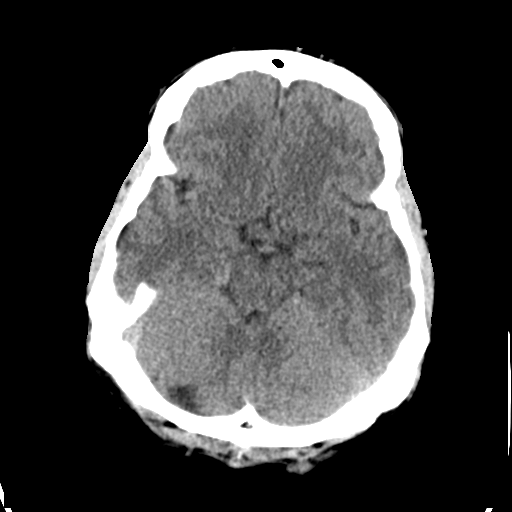
[im 12/28  brain]
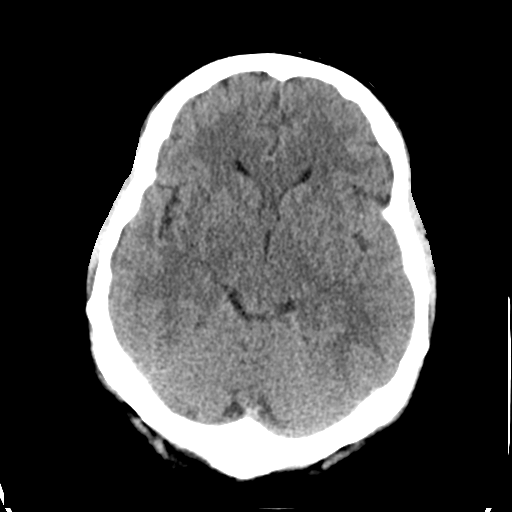
[im 14/28  brain]
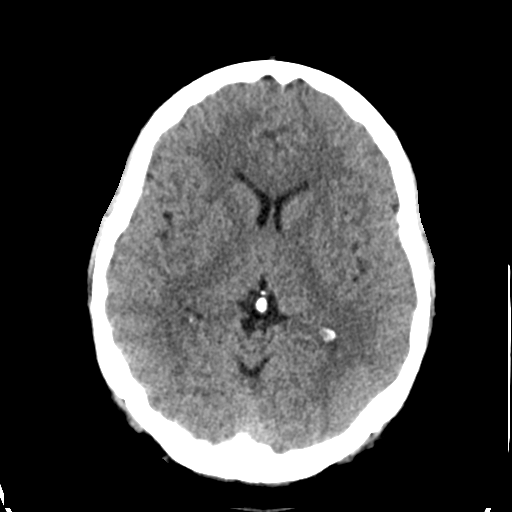
[im 15/28  brain]
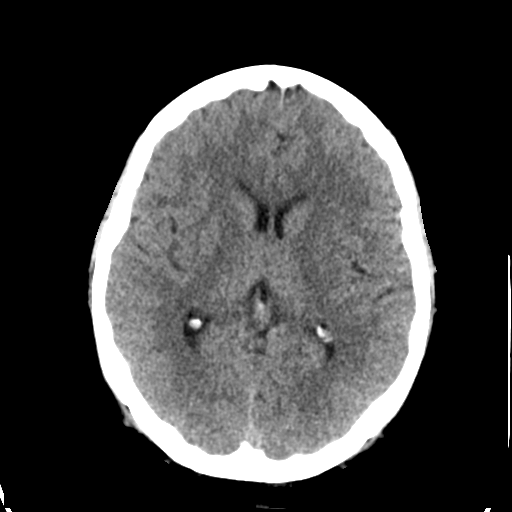
[im 15/28  bone]
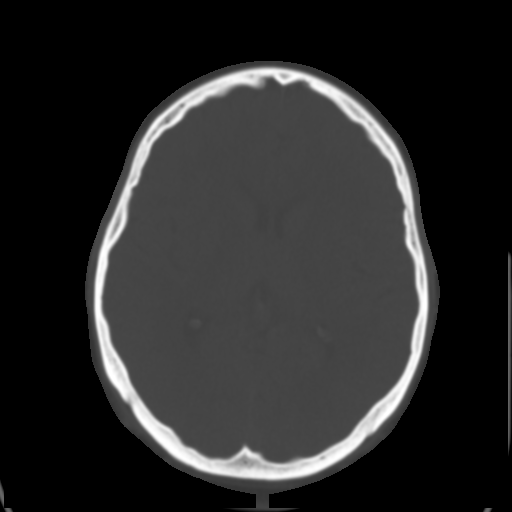
[im 17/28  brain]
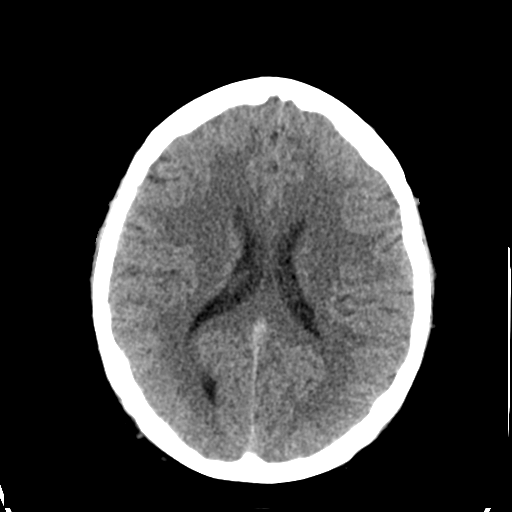
[im 19/28  brain]
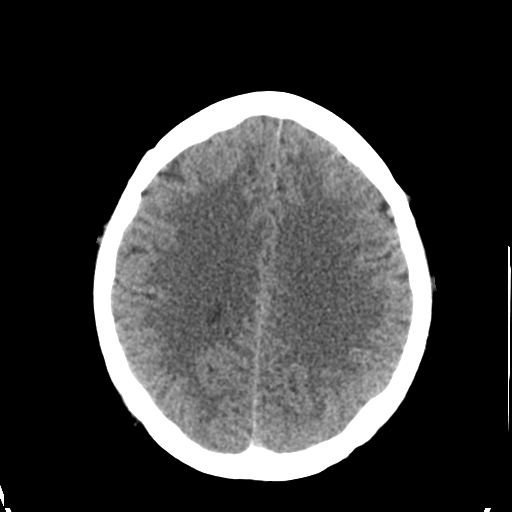
[im 20/28  brain]
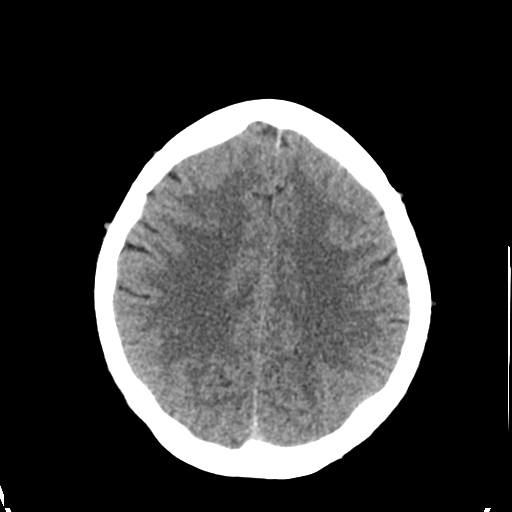
[im 22/28  brain]
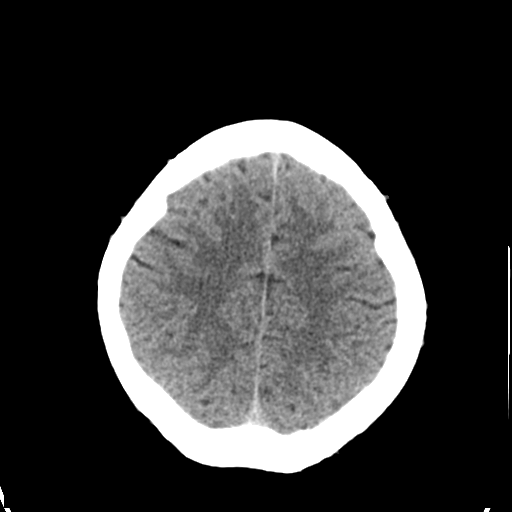
[im 22/28  bone]
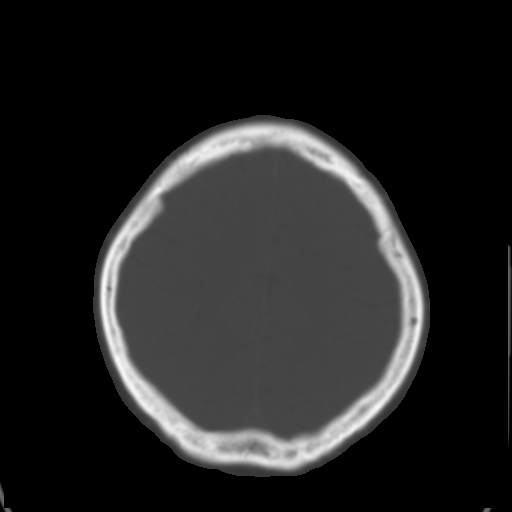
[im 23/28  brain]
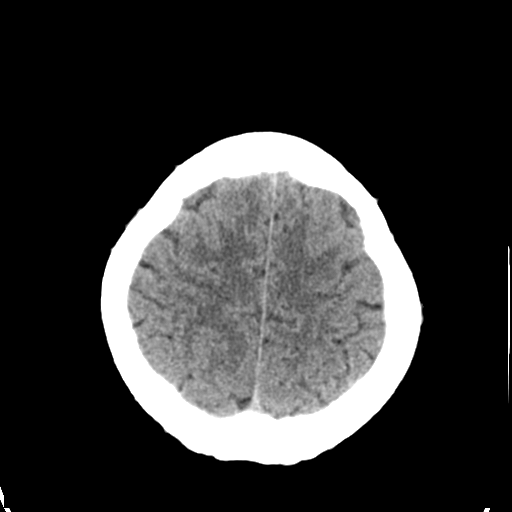
[im 25/28  brain]
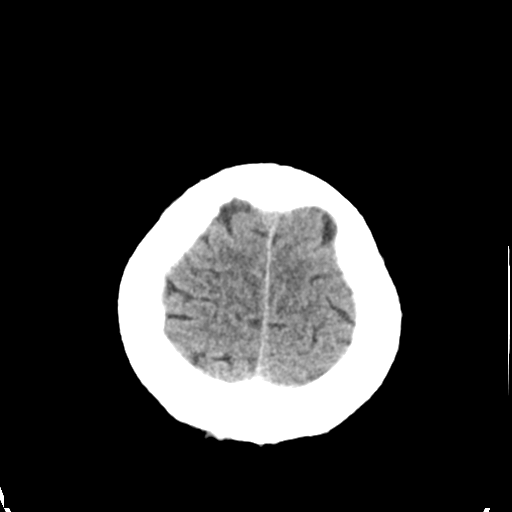
[im 27/28  brain]
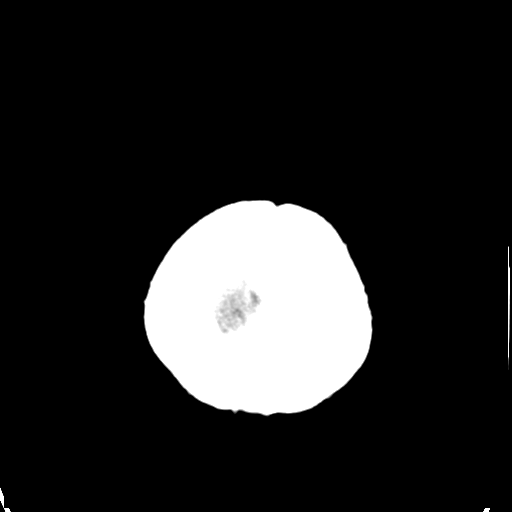

[16 of 28 positions shown; findings below may reference images not displayed]

FINDINGS: The ventricles and sulci are symmetrical without
significant effacement, displacement, or dilatation. No mass effect
or midline shift. No abnormal extra-axial fluid collections. The
grey-white matter junction is distinct. Basal cisterns are not
effaced. No acute intracranial hemorrhage. No depressed skull
fractures.  Visualized paranasal sinuses and mastoid air cells are
not opacified.  Vascular calcifications.  No significant changes
since the previous study.
IMPRESSION: No acute intracranial abnormalities.

## 2014-12-01 ENCOUNTER — Ambulatory Visit (INDEPENDENT_AMBULATORY_CARE_PROVIDER_SITE_OTHER): Payer: Medicare Other | Admitting: Cardiology

## 2014-12-01 ENCOUNTER — Encounter: Payer: Self-pay | Admitting: Cardiology

## 2014-12-01 VITALS — BP 120/70 | HR 66 | Ht 70.0 in | Wt 228.0 lb

## 2014-12-01 DIAGNOSIS — I5042 Chronic combined systolic (congestive) and diastolic (congestive) heart failure: Secondary | ICD-10-CM

## 2014-12-01 DIAGNOSIS — I10 Essential (primary) hypertension: Secondary | ICD-10-CM

## 2014-12-01 NOTE — Patient Instructions (Signed)
The current medical regimen is effective;  continue present plan and medications.  Your physician has requested that you have an echocardiogram. Echocardiography is a painless test that uses sound waves to create images of your heart. It provides your doctor with information about the size and shape of your heart and how well your heart's chambers and valves are working. This procedure takes approximately one hour. There are no restrictions for this procedure.  Follow up in 6 months with Dr. Antoine Poche.  You will receive a letter in the mail 2 months before you are due.  Please call us when you receive this letter to schedule your follow up appointment.  Thank you for choosing [facility]!!

## 2014-12-01 NOTE — Progress Notes (Signed)
HPI Patient presents for followup coronary disease and cardiomyopathy. He's had very difficult to control hypertension. However, now is doing very well. His 3 daughters living with him and he is enjoying this situation. The patient denies any new symptoms such as chest discomfort, neck or arm discomfort. There has been no new shortness of breath, PND or orthopnea. There have been no reported palpitations, presyncope or syncope.  He seems to be very compliant with his medications and his lifestyle is improved. He walks routinely with his daughters and his sister.  With this he denies any symptoms.  (Class I)  No Known Allergies  Current Outpatient Prescriptions  Medication Sig Dispense Refill  . aspirin 325 MG EC tablet Take 325 mg by mouth daily.    Marland Kitchen atorvastatin (LIPITOR) 40 MG tablet Take 1 tablet (40 mg total) by mouth daily at 6 PM. 30 tablet 5  . budesonide-formoterol (SYMBICORT) 160-4.5 MCG/ACT inhaler Inhale 2 puffs into the lungs 2 (two) times daily.    . calcitRIOL (ROCALTROL) 0.25 MCG capsule Take 0.25 mcg by mouth 3 (three) times a week. On Monday, Wednesday, and Friday.    . carvedilol (COREG) 25 MG tablet Take 2 tablets (50 mg total) by mouth 2 (two) times daily with a meal. 120 tablet 5  . cholecalciferol (VITAMIN D) 1000 UNITS tablet Take 1,000 Units by mouth daily.    . hydrALAZINE (APRESOLINE) 50 MG tablet Take 1.5 tablets (75 mg total) by mouth 3 (three) times daily. 135 tablet 5  . HYDROcodone-acetaminophen (NORCO/VICODIN) 5-325 MG per tablet Take 1 tablet by mouth every 4 (four) hours as needed for moderate pain. 15 tablet 0  . isosorbide mononitrate (IMDUR) 60 MG 24 hr tablet Take 1 tablet (60 mg total) by mouth daily. 30 tablet 5  . nitroGLYCERIN (NITROSTAT) 0.4 MG SL tablet Place 1 tablet (0.4 mg total) under the tongue every 5 (five) minutes as needed for chest pain. 25 tablet 3  . potassium chloride SA (K-DUR,KLOR-CON) 20 MEQ tablet Take 20 mEq by mouth 2 (two) times  daily.    . ranolazine (RANEXA) 500 MG 12 hr tablet Take 1 tablet (500 mg total) by mouth 2 (two) times daily. 60 tablet 3  . torsemide (DEMADEX) 20 MG tablet Take 20 mg by mouth 2 (two) times daily.     No current facility-administered medications for this visit.    Past Medical History  Diagnosis Date  . CAD (coronary artery disease)     a. NSTEMI 2008: in setting of profound epistaxis/anemia, cath showing severe diffuse RCA dz with L-R collaterals, nonobst dz in left system, managed medically. b. Type II NSTEMI 10/2008. c. Last cath 2012 - for med rx. d. 05/2013: elevated troponin ? due to ARF.  e. Ranexa added 03/2012.  Marland Kitchen Hypertension     a. Difficult to control. b. Renal duplex neg for RAS 11/2012.  Marland Kitchen Hyperlipidemia   . Chronic kidney disease     a. Average creatine of about 2.3  . Cardiomyopathy     a. Mixed ICM/NICM - Prior EF 25% back to 2008. b. Improved to 55% in 2012. c. Subsequent echoes: 45% in 11/2010, 15% by echo 05/2013.  Marland Kitchen Anemia     a. 2008 in setting of profound epistaxis.  Marland Kitchen History of pneumonia 08/2012, 05/2013  . Epistaxis     a. 2008, 2009 a/w significant anemia.  Marland Kitchen SVT (supraventricular tachycardia)     a. 05/2013 tx with 6mg  adenosine in ER.  . CHF (  congestive heart failure)     EF 15%    Past Surgical History  Procedure Laterality Date  . Cardiac catheterization  2012    total RCA occlusion with left-to-right collaterals supplying the distal RCA branches, mild diffuse nonobstructive disease of LAD and LCx. Medically managed    ROS:  As stated in the HPI and negative for all other systems.  PHYSICAL EXAM BP 120/70 mmHg  Pulse 66  Ht  (1.778 m)  Wt 228 lb (103.42 kg)  BMI 32.71 kg/m2 GENERAL:  Well appearing NECK:  No jugular venous distention, waveform within normal limits, carotid upstroke brisk and symmetric, no bruits, no thyromegaly LUNGS:  Clear to auscultation bilaterally BACK:  No CVA tenderness CHEST:  Unremarkable HEART:  PMI not  displaced or sustained,S1 and S2 within normal limits, no S3, no S4, no clicks, no rubs, no murmurs ABD:  Flat, positive bowel sounds normal in frequency in pitch, no bruits, no rebound, no guarding, no midline pulsatile mass, no hepatomegaly, no splenomegaly EXT:  2 plus pulses throughout, no edema, no cyanosis no clubbing  EKG:  Sinus rhythm, rate 6, axis within normal limits, poor anterior R wave progression, QTC prolonged, old inferior infarct.  12/01/2014  ASSESSMENT AND PLAN  CAD -  Given the absence of symptoms no change in therapy is indicated. No further imaging is indicated.  CHRONIC SYSTOLIC HEART FAILURE -  Given the fact that he's done so well I am going to see his ejection fraction has improved with another echocardiogram.  I would have to consider an ICD if his EF is less and 35%.  I would consider this now because I really think he changed his lifestyle has been much more compliant than he was in the past.  CHRONIC KIDNEY DISEASE UNSPECIFIED - He is followed also by nephrology.  I will defer follow-up of his BMET to nephrology.  HYPERTENSION - His blood pressure is well controlled on this regimen. I will continue with this.

## 2014-12-06 ENCOUNTER — Other Ambulatory Visit: Payer: Self-pay | Admitting: Radiology

## 2014-12-06 DIAGNOSIS — I5042 Chronic combined systolic (congestive) and diastolic (congestive) heart failure: Secondary | ICD-10-CM

## 2014-12-16 ENCOUNTER — Other Ambulatory Visit: Payer: Medicare Other

## 2014-12-26 DIAGNOSIS — R0989 Other specified symptoms and signs involving the circulatory and respiratory systems: Secondary | ICD-10-CM

## 2014-12-29 ENCOUNTER — Other Ambulatory Visit: Payer: Medicare Other

## 2015-01-04 ENCOUNTER — Emergency Department (HOSPITAL_COMMUNITY)
Admission: EM | Admit: 2015-01-04 | Discharge: 2015-01-04 | Discharge status: Against Medical Advice | Payer: Medicare Other | Attending: Emergency Medicine | Admitting: Emergency Medicine

## 2015-01-04 ENCOUNTER — Encounter (HOSPITAL_COMMUNITY): Payer: Self-pay | Admitting: *Deleted

## 2015-01-04 ENCOUNTER — Emergency Department (HOSPITAL_COMMUNITY): Payer: Medicare Other

## 2015-01-04 DIAGNOSIS — Z9889 Other specified postprocedural states: Secondary | ICD-10-CM | POA: Insufficient documentation

## 2015-01-04 DIAGNOSIS — I509 Heart failure, unspecified: Secondary | ICD-10-CM | POA: Diagnosis not present

## 2015-01-04 DIAGNOSIS — Z8701 Personal history of pneumonia (recurrent): Secondary | ICD-10-CM | POA: Insufficient documentation

## 2015-01-04 DIAGNOSIS — R7989 Other specified abnormal findings of blood chemistry: Secondary | ICD-10-CM | POA: Diagnosis not present

## 2015-01-04 DIAGNOSIS — I2510 Atherosclerotic heart disease of native coronary artery without angina pectoris: Secondary | ICD-10-CM | POA: Insufficient documentation

## 2015-01-04 DIAGNOSIS — Z7982 Long term (current) use of aspirin: Secondary | ICD-10-CM | POA: Diagnosis not present

## 2015-01-04 DIAGNOSIS — R600 Localized edema: Secondary | ICD-10-CM | POA: Diagnosis not present

## 2015-01-04 DIAGNOSIS — R778 Other specified abnormalities of plasma proteins: Secondary | ICD-10-CM

## 2015-01-04 DIAGNOSIS — N189 Chronic kidney disease, unspecified: Secondary | ICD-10-CM | POA: Insufficient documentation

## 2015-01-04 DIAGNOSIS — I129 Hypertensive chronic kidney disease with stage 1 through stage 4 chronic kidney disease, or unspecified chronic kidney disease: Secondary | ICD-10-CM | POA: Diagnosis not present

## 2015-01-04 DIAGNOSIS — Z862 Personal history of diseases of the blood and blood-forming organs and certain disorders involving the immune mechanism: Secondary | ICD-10-CM | POA: Diagnosis not present

## 2015-01-04 DIAGNOSIS — Z79899 Other long term (current) drug therapy: Secondary | ICD-10-CM | POA: Diagnosis not present

## 2015-01-04 DIAGNOSIS — Z87891 Personal history of nicotine dependence: Secondary | ICD-10-CM | POA: Diagnosis not present

## 2015-01-04 DIAGNOSIS — R079 Chest pain, unspecified: Secondary | ICD-10-CM | POA: Diagnosis present

## 2015-01-04 DIAGNOSIS — R609 Edema, unspecified: Secondary | ICD-10-CM

## 2015-01-04 LAB — BASIC METABOLIC PANEL
Anion gap: 5 (ref 5–15)
BUN: 26 mg/dL — AB (ref 6–20)
CHLORIDE: 112 mmol/L — AB (ref 101–111)
CO2: 26 mmol/L (ref 22–32)
Calcium: 8.4 mg/dL — ABNORMAL LOW (ref 8.9–10.3)
Creatinine, Ser: 2.14 mg/dL — ABNORMAL HIGH (ref 0.61–1.24)
GFR calc non Af Amer: 35 mL/min — ABNORMAL LOW (ref >60)
GFR, EST AFRICAN AMERICAN: 41 mL/min — AB (ref >60)
Glucose, Bld: 181 mg/dL — ABNORMAL HIGH (ref 70–99)
POTASSIUM: 4.5 mmol/L (ref 3.5–5.1)
Sodium: 143 mmol/L (ref 135–145)

## 2015-01-04 LAB — CBC
HCT: 38.6 % — ABNORMAL LOW (ref 39.0–52.0)
HEMOGLOBIN: 12.3 g/dL — AB (ref 13.0–17.0)
MCH: 29.3 pg (ref 26.0–34.0)
MCHC: 31.9 g/dL (ref 30.0–36.0)
MCV: 91.9 fL (ref 78.0–100.0)
Platelets: 214 10*3/uL (ref 150–400)
RBC: 4.2 MIL/uL — ABNORMAL LOW (ref 4.22–5.81)
RDW: 16.2 % — ABNORMAL HIGH (ref 11.5–15.5)
WBC: 10.2 10*3/uL (ref 4.0–10.5)

## 2015-01-04 LAB — TROPONIN I: TROPONIN I: 0.08 ng/mL — AB (ref <0.031)

## 2015-01-04 MED ORDER — FUROSEMIDE 10 MG/ML IJ SOLN
40.0000 mg | Freq: Once | INTRAMUSCULAR | Status: AC
  Administered 2015-01-04: 40 mg via INTRAVENOUS
  Filled 2015-01-04: qty 4

## 2015-01-04 MED ORDER — FUROSEMIDE 40 MG PO TABS: 40.0000 mg | ORAL_TABLET | Freq: Every day | ORAL | Status: DC

## 2015-01-04 NOTE — ED Provider Notes (Signed)
CSN: [id number]     Arrival date & time 01/04/15  9147 History  This chart was scribed for Donnetta Hutching, MD by Tanda Rockers, ED Scribe. This patient was seen in room APA05/APA05 and the patient's care was started at 9:34 AM.    Chief Complaint  Patient presents with  . Chest Pain   The history is provided by the patient. No language interpreter was used.     HPI Comments: [name] is a 49 y.o. male brought in by ambulance, with hx CAD, MI,  CHF, CKD, HTN, and  HLD who presents to the Emergency Department complaining of sudden onset shortness of breath and bilateral leg swelling that occurred around 3:30 AM (6 hours ago). Pt also complains of sternal chest tightness that is exacerbated upon deep breathing. He mentions that he felt fine prior to going to sleep. Pt was seen at PCP's office, Dr. Lysbeth Galas, earlier today and was sent via EMS due to symptoms. PT was given 324 mg Aspirin and 2 sublingual NTG en route. He denies nausea, vomiting, diaphoresis, or any other symptoms.    Past Medical History  Diagnosis Date  . CAD (coronary artery disease)     a. NSTEMI 2008: in setting of profound epistaxis/anemia, cath showing severe diffuse RCA dz with L-R collaterals, nonobst dz in left system, managed medically. b. Type II NSTEMI 10/2008. c. Last cath 2012 - for med rx. d. 05/2013: elevated troponin ? due to ARF.  e. Ranexa added 03/2012.  Marland Kitchen Hypertension     a. Difficult to control. b. Renal duplex neg for RAS 11/2012.  Marland Kitchen Hyperlipidemia   . Chronic kidney disease     a. Average creatine of about 2.3  . Cardiomyopathy     a. Mixed ICM/NICM - Prior EF 25% back to 2008. b. Improved to 55% in 2012. c. Subsequent echoes: 45% in 11/2010, 15% by echo 05/2013.  Marland Kitchen Anemia     a. 2008 in setting of profound epistaxis.  Marland Kitchen History of pneumonia 08/2012, 05/2013  . Epistaxis     a. 2008, 2009 a/w significant anemia.  Marland Kitchen SVT (supraventricular tachycardia)     a. 05/2013 tx with  adenosine in ER.  . CHF  (congestive heart failure)     EF 15%   Past Surgical History  Procedure Laterality Date  . Cardiac catheterization  2012    total RCA occlusion with left-to-right collaterals supplying the distal RCA branches, mild diffuse nonobstructive disease of LAD and LCx. Medically managed   Family History  Problem Relation Age of Onset  . Hypertension Mother     deceased  . Diabetes Mother     deceased  . Heart disease Mother     deceased  . Heart attack Father     at age 74, deceased   History  Substance Use Topics  . Smoking status: Former Smoker -- 1.00 packs/day for 10 years    Types: Cigarettes    Quit date: 11/16/2006  . Smokeless tobacco: Never Used  . Alcohol Use: No    Review of Systems  A complete 10 system review of systems was obtained and all systems are negative except as noted in the HPI and PMH.    Allergies  Review of patient's allergies indicates no known allergies.  Home Medications   Prior to Admission medications   Medication Sig Start Date End Date Taking? Authorizing Provider  aspirin 325 MG EC tablet Take 325 mg by mouth daily.   Yes Historical Provider,  MD  atorvastatin (LIPITOR) 40 MG tablet Take 1 tablet (40 mg total) by mouth daily at 6 PM. 07/13/14  Yes Rollene Rotunda, MD  calcitRIOL (ROCALTROL) 0.25 MCG capsule Take 0.25 mcg by mouth 3 (three) times a week. On Monday, Wednesday, and Friday. 11/24/13  Yes Historical Provider, MD  carvedilol (COREG) 25 MG tablet Take 2 tablets (50 mg total) by mouth 2 (two) times daily with a meal. 06/11/14  Yes Rollene Rotunda, MD  cholecalciferol (VITAMIN D) 1000 UNITS tablet Take 1,000 Units by mouth daily.   Yes Historical Provider, MD  hydrALAZINE (APRESOLINE) 50 MG tablet Take 1.5 tablets (75 mg total) by mouth 3 (three) times daily. 06/11/14  Yes Rollene Rotunda, MD  HYDROcodone-acetaminophen (NORCO/VICODIN) 5-325 MG per tablet Take 1 tablet by mouth every 4 (four) hours as needed for moderate pain. 11/17/13  Yes  Azalia Bilis, MD  isosorbide mononitrate (IMDUR) 60 MG 24 hr tablet Take 1 tablet (60 mg total) by mouth daily. 07/13/14  Yes Rollene Rotunda, MD  nitroGLYCERIN (NITROSTAT) 0.4 MG SL tablet Place 1 tablet (0.4 mg total) under the tongue every 5 (five) minutes as needed for chest pain. 08/18/14  Yes Rollene Rotunda, MD  potassium chloride SA (K-DUR,KLOR-CON) 20 MEQ tablet Take 20 mEq by mouth 2 (two) times daily.   Yes Historical Provider, MD  ranolazine (RANEXA) 500 MG 12 hr tablet Take 1 tablet (500 mg total) by mouth 2 (two) times daily. 08/18/14 08/18/15 Yes Rollene Rotunda, MD  torsemide (DEMADEX) 20 MG tablet Take 20 mg by mouth 2 (two) times daily.   Yes Historical Provider, MD  furosemide (LASIX) 40 MG tablet Take 1 tablet (40 mg total) by mouth daily. 01/04/15   Donnetta Hutching, MD   Triage Vitals: BP 173/115 mmHg  Pulse 70  Temp(Src) 97.8 F (36.6 C) (Oral)  Resp 23  Ht  (1.778 m)  Wt 230 lb (104.327 kg)  BMI 33.00 kg/m2  SpO2 99%   Physical Exam  Constitutional: He is oriented to person, place, and time. He appears well-developed and well-nourished.  HENT:  Head: Normocephalic and atraumatic.  Eyes: Conjunctivae and EOM are normal. Pupils are equal, round, and reactive to light.  Neck: Normal range of motion. Neck supple.  Cardiovascular: Normal rate and regular rhythm.   Pulmonary/Chest: Effort normal and breath sounds normal.  Abdominal: Soft. Bowel sounds are normal.  Musculoskeletal: Normal range of motion.  4+ peripheral edema.   Neurological: He is alert and oriented to person, place, and time.  Skin: Skin is warm and dry.  Psychiatric: He has a normal mood and affect. His behavior is normal.  Nursing note and vitals reviewed.   ED Course  Procedures (including critical care time)  DIAGNOSTIC STUDIES: Oxygen Saturation is 99% on [place], normal by my interpretation.    COORDINATION OF CARE: 9:36 AM- Suspect pulmonary edema. Discussed treatment plan which includes IV  Lasix with pt at bedside and pt agreed to plan. Discussed admitting pt to the hospital. He states that he cannot stay in the hospital because he is the sole provider of his children and he needs to be home with them. Will contact social worker to see if there is anything we can do.   Labs Review Labs Reviewed  CBC - Abnormal; Notable for the following:    RBC 4.20 (*)    Hemoglobin 12.3 (*)    HCT 38.6 (*)    RDW 16.2 (*)    All other components within normal limits  BASIC METABOLIC PANEL - Abnormal; Notable for the following:    Chloride 112 (*)    Glucose, Bld 181 (*)    BUN 26 (*)    Creatinine, Ser 2.14 (*)    Calcium 8.4 (*)    GFR calc non Af Amer 35 (*)    GFR calc Af Amer 41 (*)    All other components within normal limits  TROPONIN I - Abnormal; Notable for the following:    Troponin I 0.08 (*)    All other components within normal limits    Imaging Review Dg Chest Port 1 View  01/04/2015   CLINICAL DATA:  Lambert Mody mid chest pain with shortness of breath since earlier today. History of hypertension and chronic renal disease.  EXAM: PORTABLE CHEST - 1 VIEW  COMPARISON:  11/2014.  FINDINGS: Marked cardiac enlargement. Mild vascular congestion without overt failure or consolidation. No acute osseous findings. Slight worsening aeration.  IMPRESSION: Cardiomegaly with mild vascular congestion.   Electronically Signed   By: Davonna Belling M.D.   On: 01/04/2015 10:07     EKG Interpretation   Date/Time:  Tuesday Jan 04 2015 09:24:20 EDT Ventricular Rate:  71 PR Interval:  172 QRS Duration: 118 QT Interval:  466 QTC Calculation: 506 R Axis:   52 Text Interpretation:  Sinus rhythm Ventricular trigeminy Probable left  atrial enlargement Nonspecific intraventricular conduction delay Abnormal  lateral Q waves Probable anteroseptal infarct, old Confirmed by Tacie Mccuistion  MD,  Terisa Belardo ([id number]) on 01/04/2015 9:46:37 AM      MDM   Final diagnoses:  Elevated troponin  Peripheral edema   I  strongly recommended patient be admitted to the hospital. He understands the abnormal tests including troponin and chest x-ray. He feels much better after Lasix 40 mg 2 aliquots. Discharge medication Lasix 40 mg daily. He understands the seriousness of his decision to leave AGAINST MEDICAL ADVICE including worsening condition and possible heart attack and death. This was discussed with the patient and his brother.   I personally performed the services described in this documentation, which was scribed in my presence. The recorded information has been reviewed and is accurate.      Donnetta Hutching, MD 01/04/15 [phone]

## 2015-01-04 NOTE — ED Notes (Signed)
MD Cook at bedside. 

## 2015-01-04 NOTE — Clinical Social Work Note (Signed)
Clinical Social Work Assessment  Patient Details  Name: [name] MRN: [id number] Date of Birth: 1966/02/08  Date of referral:  01/04/15               Reason for consult:  Other (Comment Required) (Patient referred due to the potiential of inpatient and the belief that if made inpatient he did not have caretaker for his children.)                Permission sought to share information with:    Permission granted to share information::     Name::        Agency::     Relationship::     Contact Information:     Housing/Transportation Living arrangements for the past 2 months:  Apartment Source of Information:  Patient Patient Interpreter Needed:  None Criminal Activity/Legal Involvement Pertinent to Current Situation/Hospitalization:  No - Comment as needed Significant Relationships:  Siblings, Dependent Children Lives with:  Minor Children Do you feel safe going back to the place where you live?  Yes Need for family participation in patient care:  Yes (Comment) (If patient is admitted and if he chooses to say, he reports that his sister Donnalee Curry will provide care for his children.)  Care giving concerns:  No concerns regarding care of patient.   Social Worker assessment / plan:  CSW met with patient who was alert and oriented.  CSW discussed the reason for the referral being the potential for admission and the need for caregivers for his children.  Patient indicated that she has three children ages, 74, 22, and 19.  He stated that if he is admitted he is "not staying. I want to be there with my kids." Patient indicated that he he is admitted and decides to stay, he sister, Donnalee Curry, will provide care for his children. Patient stated, "I don't like hospitals." He indicated that his choice not to stay was based on him not wanting to stay not because he did not have anyone to keep his children. Patient indicated that if he chose to stay, he would contact his sister and did not  need for CSW to contact her on his behalf. CSW signing off.    Employment status:  Disabled (Comment on whether or not currently receiving Disability) Insurance information:  Medicare PT Recommendations:    Information / Referral to community resources:     Patient/Family's Response to care:  Patient indicates that he does not want to be admitted and if he is admitted he will probably leave AMA.  Patient/Family's Understanding of and Emotional Response to Diagnosis, Current Treatment, and Prognosis:  Patient verbalizes understanding of his medical diagnosis', treatment and prognosis. Despite knowledge of this he states that he will not likely stay if he is admitted.   Emotional Assessment Appearance:  Developmentally appropriate Attitude/Demeanor/Rapport:   (Patient was pleasant.) Affect (typically observed):  Calm Orientation:  Oriented to Self, Oriented to Place, Oriented to  Time, Oriented to Situation Alcohol / Substance use:    Psych involvement (Current and /or in the community):  No (Comment)  Discharge Needs  Concerns to be addressed:  Compliance Issues Concerns (Patient states that she is not going to stay if he is admitted. He reports that he does not like hospitals.) Readmission within the last 30 days:  No Current discharge risk:  None Barriers to Discharge:  No Barriers Identified   Juanda Bond  [phone] 01/04/2015, 11:16 AM

## 2015-01-04 NOTE — ED Notes (Signed)
Pt brought in by EMS from Dr. Joyce Copa office, c/o chest pain since 4 am with deep inspiration; hx of CHF; 324 ASA and 2 SL NTG given en route;

## 2015-01-04 NOTE — Discharge Instructions (Signed)
It has been recommended that she stay in the hospital. You are leaving against medical advice. New prescription for fluid medication called Lasix or furosemide.  Rest. Decrease salt in her diet. Elevate legs. Follow-up your primary care doctor or return if worse.

## 2015-01-07 ENCOUNTER — Telehealth: Payer: Self-pay | Admitting: *Deleted

## 2015-01-07 NOTE — Telephone Encounter (Signed)
Return fax sent to [facility] signed by Dr.Hochrein for Plan of treatment/Home Health Cert.

## 2015-01-13 ENCOUNTER — Encounter: Payer: Self-pay | Admitting: Cardiology

## 2015-01-13 ENCOUNTER — Ambulatory Visit (INDEPENDENT_AMBULATORY_CARE_PROVIDER_SITE_OTHER): Payer: Medicare Other | Admitting: Cardiology

## 2015-01-13 VITALS — BP 108/84 | HR 74 | Ht 70.0 in | Wt 221.0 lb

## 2015-01-13 DIAGNOSIS — I2510 Atherosclerotic heart disease of native coronary artery without angina pectoris: Secondary | ICD-10-CM

## 2015-01-13 LAB — BASIC METABOLIC PANEL WITH GFR
BUN: 25 mg/dL — ABNORMAL HIGH (ref 6–23)
CALCIUM: 8.7 mg/dL (ref 8.4–10.5)
CO2: 31 meq/L (ref 19–32)
CREATININE: 2.87 mg/dL — AB (ref 0.50–1.35)
Chloride: 97 mEq/L (ref 96–112)
GFR, Est African American: 29 mL/min — ABNORMAL LOW
GFR, Est Non African American: 25 mL/min — ABNORMAL LOW
Glucose, Bld: 195 mg/dL — ABNORMAL HIGH (ref 70–99)
Potassium: 4.4 mEq/L (ref 3.5–5.3)
SODIUM: 138 meq/L (ref 135–145)

## 2015-01-13 NOTE — Progress Notes (Signed)
01/13/2015 [name]   Apr 19, 1966  [id number]  Primary Physician Josue Hector, MD Primary Cardiologist: Dr. Antoine Poche  Reason for Visit/CC: Referral by ED for Abnormal EKG; Chronic Systolic HF  HPI:  The patient is a 49 y/o male, followed by Dr. Antoine Poche. He has a history of CAD and cardiomyopathy. He had a LHC in 2012 showing total RCA occlusion with left-to-right collaterals supplying the distal RCA branches, mild diffuse nonobstructive disease of LAD and LCx. Medically managed. He has mixed ischemic/nonischemic cardiomyopathy. Prior EF in 2008 was 25%, which had improved to 55% in 2012. However his last 2D echo in 2014 showed redcution down to 15%. He is on HF therapy with Coreg and hydralazine + nitrate in place of ACE/ARB therapy due to CKD with baseline Scr ~2.0. He also has a h/o SVT in 2014 treated in the ER with adenosin and difficult to control HTN. He last saw Dr. Antoine Poche in clinic on 12/01/14. Dr. Antoine Poche recommended repeating a 2D echo to reassess LVF with plans to refer to EP if no improvement in EF beyond 35%. However, patient failed to get this done.   He presents to clinic today after bering referrred by the ED for an abnormal EKG, 5/4, showing ventricular bigeminy. He presented the the [facility] ED 5/4 with complaints of dyspnea and LEE, felt to be c/w acute on chronic systolic CHF. Patient reports that his PCP discontinued his lasix several months ago due to abnormal renal function. He was treated in the ED with IV Lasix and had a good response and improvement in symptoms while in the ED. He did not require admission. Our practice was not consulted. He was instructed to resume PO lasix at home.   Since leaving the ED, he reports that he has done well. He is compliant with daily lasix and low sodium diet. Not compliant with daily weights. He denies any further dyspnea. No PND/ orthopnea. His LEE has resolved. No weight gain. He denies any chest pain, palpitations,  diaphoresis, syncope/ near syncope. He does not recall having any palpitations when ED EKG showed ventricular bigeminy. He states he is fully compliant with Coreg and all other meds. His EKG shows SR. No PVCs. HR 76 bpm. BP is soft but stable at 108/84.    Current Outpatient Prescriptions  Medication Sig Dispense Refill  . aspirin 325 MG EC tablet Take 325 mg by mouth daily.    Marland Kitchen atorvastatin (LIPITOR) 40 MG tablet Take 1 tablet (40 mg total) by mouth daily at 6 PM. 30 tablet 5  . calcitRIOL (ROCALTROL) 0.25 MCG capsule Take 0.25 mcg by mouth 3 (three) times a week. On Monday, Wednesday, and Friday.    . carvedilol (COREG) 25 MG tablet Take 2 tablets (50 mg total) by mouth 2 (two) times daily with a meal. 120 tablet 5  . cholecalciferol (VITAMIN D) 1000 UNITS tablet Take 1,000 Units by mouth daily.    . furosemide (LASIX) 40 MG tablet Take 1 tablet (40 mg total) by mouth daily. 30 tablet 0  . hydrALAZINE (APRESOLINE) 50 MG tablet Take 1.5 tablets (75 mg total) by mouth 3 (three) times daily. 135 tablet 5  . HYDROcodone-acetaminophen (NORCO/VICODIN) 5-325 MG per tablet Take 1 tablet by mouth every 4 (four) hours as needed for moderate pain. 15 tablet 0  . isosorbide mononitrate (IMDUR) 60 MG 24 hr tablet Take 1 tablet (60 mg total) by mouth daily. 30 tablet 5  . nitroGLYCERIN (NITROSTAT) 0.4 MG SL tablet Place 1  tablet (0.4 mg total) under the tongue every 5 (five) minutes as needed for chest pain. 25 tablet 3  . potassium chloride SA (K-DUR,KLOR-CON) 20 MEQ tablet Take 20 mEq by mouth 2 (two) times daily.    . ranolazine (RANEXA) 500 MG 12 hr tablet Take 1 tablet (500 mg total) by mouth 2 (two) times daily. 60 tablet 3  . torsemide (DEMADEX) 20 MG tablet Take 20 mg by mouth 2 (two) times daily.     No current facility-administered medications for this visit.    No Known Allergies  History   Social History  . Marital Status: Legally Separated    Spouse Name: N/A  . Number of Children: 6    . Years of Education: N/A   Occupational History  . Unemployed    Social History Main Topics  . Smoking status: Former Smoker -- 1.00 packs/day for 10 years    Types: Cigarettes    Quit date: 11/16/2006  . Smokeless tobacco: Never Used  . Alcohol Use: No  . Drug Use: Yes     Comment: Previous marijuana use  . Sexual Activity: Not on file   Other Topics Concern  . Not on file   Social History Narrative   The patient lives in [place] with his sister. He is legally separated and has 6 healthy children who do not live with him. He is not employed and is trying to get disability     Review of Systems: General: negative for chills, fever, night sweats or weight changes.  Cardiovascular: negative for chest pain, dyspnea on exertion, edema, orthopnea, palpitations, paroxysmal nocturnal dyspnea or shortness of breath Dermatological: negative for rash Respiratory: negative for cough or wheezing Urologic: negative for hematuria Abdominal: negative for nausea, vomiting, diarrhea, bright red blood per rectum, melena, or hematemesis Neurologic: negative for visual changes, syncope, or dizziness All other systems reviewed and are otherwise negative except as noted above.    Blood pressure 108/84, pulse 74, height  (1.778 m), weight 221 lb (100.245 kg).  General appearance: alert, cooperative and no distress Neck: no carotid bruit and no JVD Lungs: clear to auscultation bilaterally Heart: regular rate and rhythm, S1, S2 normal, no murmur, click, rub or gallop Extremities: no LEE Pulses: 2+ and symmetric Skin: warm and dry Neurologic: Grossly normal  EKG NSR w/ sinus arrhthymia, HR 74 bpm  ASSESSMENT AND PLAN:   1. Chronic Systolic CHF: currently asymptomatic. Euvolemic on exam. Continue daily Lasix, low sodium diet and daily weights.   2. Mixed Combined Ischemic/nonischemic Cardiomyopathy: Last 2D echo in 2014 showed severely reduced EF of 15%. On appropriate medical  therapy, on the maximum recommended dose of Coreg. Not a candidate for ACE/ARB therapy given CKD. He is on hydralazine + nitrate. Will recheck 2D echo to reassess LVF. If EF remains <35%, despite medical therapy, will need referral to EP for consideration for ICD for primary prevention of SCD.  3. Abnormal EKG: ventricular bigeminy noted on prior EKG. In the setting of known cardiomyopathy. Patient denies any symptoms of palpations, dizziness, dyspnea, syncope/ near syncope. EKG today shows NSR w/o PVCs. Continue BB therapy. Will need to consider ICD if repeat assessment of EF is low.   4.CAD: stable w/o CP. Continue medical therapy.  5. CKD: baseline Scr ~2.0. Renal function has not been reassessed since Lasix was restarted about 1 week ago. Will order a BMP to reassess renal function and electrolytes.   PLAN  Check 2D echo as planned. F/u with Dr.  Hochrein  SIMMONS, BRITTAINYPA-C 01/13/2015 11:20 AM

## 2015-01-13 NOTE — Addendum Note (Signed)
Addended by: Freddi Starr on: 01/13/2015 04:12 PM   Modules accepted: Orders

## 2015-01-13 NOTE — Patient Instructions (Signed)
Your physician recommends that you schedule a follow-up appointment in: AS SCHEDULED  Your physician has requested that you have an echocardiogram. Echocardiography is a painless test that uses sound waves to create images of your heart. It provides your doctor with information about the size and shape of your heart and how well your heart's chambers and valves are working. This procedure takes approximately one hour. There are no restrictions for this procedure.   Your physician recommends that you HAVE LAB WORK TODAY  WEIGHT DAILY AND CALL WITH A WEIGHT GAIN 3 LBS OR MORE IN ONE DAY Low-Sodium Eating Plan Sodium raises blood pressure and causes water to be held in the body. Getting less sodium from food will help lower your blood pressure, reduce any swelling, and protect your heart, liver, and kidneys. We get sodium by adding salt (sodium chloride) to food. Most of our sodium comes from canned, boxed, and frozen foods. Restaurant foods, fast foods, and pizza are also very high in sodium. Even if you take medicine to lower your blood pressure or to reduce fluid in your body, getting less sodium from your food is important. WHAT IS MY PLAN? Most people should limit their sodium intake to 2,300 mg a day. Your health care provider recommends that you limit your sodium intake to __________ a day.  WHAT DO I NEED TO KNOW ABOUT THIS EATING PLAN? For the low-sodium eating plan, you will follow these general guidelines:  Choose foods with a % Daily Value for sodium of less than 5% (as listed on the food label).   Use salt-free seasonings or herbs instead of table salt or sea salt.   Check with your health care provider or pharmacist before using salt substitutes.   Eat fresh foods.  Eat more vegetables and fruits.  Limit canned vegetables. If you do use them, rinse them well to decrease the sodium.   Limit cheese to 1 oz (28 g) per day.   Eat lower-sodium products, often labeled as "lower  sodium" or "no salt added."  Avoid foods that contain monosodium glutamate (MSG). MSG is sometimes added to [place] food and some canned foods.  Check food labels (Nutrition Facts labels) on foods to learn how much sodium is in one serving.  Eat more home-cooked food and less restaurant, buffet, and fast food.  When eating at a restaurant, ask that your food be prepared with less salt or none, if possible.  HOW DO I READ FOOD LABELS FOR SODIUM INFORMATION? The Nutrition Facts label lists the amount of sodium in one serving of the food. If you eat more than one serving, you must multiply the listed amount of sodium by the number of servings. Food labels may also identify foods as:  Sodium free--Less than 5 mg in a serving.  Very low sodium--35 mg or less in a serving.  Low sodium--140 mg or less in a serving.  Light in sodium--50% less sodium in a serving. For example, if a food that usually has 300 mg of sodium is changed to become light in sodium, it will have 150 mg of sodium.  Reduced sodium--25% less sodium in a serving. For example, if a food that usually has 400 mg of sodium is changed to reduced sodium, it will have 300 mg of sodium. WHAT FOODS CAN I EAT? Grains Low-sodium cereals, including oats, puffed wheat and rice, and shredded wheat cereals. Low-sodium crackers. Unsalted rice and pasta. Lower-sodium bread.  Vegetables Frozen or fresh vegetables. Low-sodium or reduced-sodium  canned vegetables. Low-sodium or reduced-sodium tomato sauce and paste. Low-sodium or reduced-sodium tomato and vegetable juices.  Fruits Fresh, frozen, and canned fruit. Fruit juice.  Meat and Other Protein Products Low-sodium canned tuna and salmon. Fresh or frozen meat, poultry, seafood, and fish. Lamb. Unsalted nuts. Dried beans, peas, and lentils without added salt. Unsalted canned beans. Homemade soups without salt. Eggs.  Dairy Milk. Soy milk. Ricotta cheese. Low-sodium or  reduced-sodium cheeses. Yogurt.  Condiments Fresh and dried herbs and spices. Salt-free seasonings. Onion and garlic powders. Low-sodium varieties of mustard and ketchup. Lemon juice.  Fats and Oils Reduced-sodium salad dressings. Unsalted butter.  Other Unsalted popcorn and pretzels.  The items listed above may not be a complete list of recommended foods or beverages. Contact your dietitian for more options. WHAT FOODS ARE NOT RECOMMENDED? Grains Instant hot cereals. Bread stuffing, pancake, and biscuit mixes. Croutons. Seasoned rice or pasta mixes. Noodle soup cups. Boxed or frozen macaroni and cheese. Self-rising flour. Regular salted crackers. Vegetables Regular canned vegetables. Regular canned tomato sauce and paste. Regular tomato and vegetable juices. Frozen vegetables in sauces. Salted french fries. Olives. Rosita Fire. Relishes. Sauerkraut. Salsa. Meat and Other Protein Products Salted, canned, smoked, spiced, or pickled meats, seafood, or fish. Bacon, ham, sausage, hot dogs, corned beef, chipped beef, and packaged luncheon meats. Salt pork. Jerky. Pickled herring. Anchovies, regular canned tuna, and sardines. Salted nuts. Dairy Processed cheese and cheese spreads. Cheese curds. Blue cheese and cottage cheese. Buttermilk.  Condiments Onion and garlic salt, seasoned salt, table salt, and sea salt. Canned and packaged gravies. Worcestershire sauce. Tartar sauce. Barbecue sauce. Teriyaki sauce. Soy sauce, including reduced sodium. Steak sauce. Fish sauce. Oyster sauce. Cocktail sauce. Horseradish. Regular ketchup and mustard. Meat flavorings and tenderizers. Bouillon cubes. Hot sauce. Tabasco sauce. Marinades. Taco seasonings. Relishes. Fats and Oils Regular salad dressings. Salted butter. Margarine. Ghee. Bacon fat.  Other Potato and tortilla chips. Corn chips and puffs. Salted popcorn and pretzels. Canned or dried soups. Pizza. Frozen entrees and pot pies.  The items listed  above may not be a complete list of foods and beverages to avoid. Contact your dietitian for more information. Document Released: 02/09/2002 Document Revised: 08/25/2013 Document Reviewed: 06/24/2013 [facility] Patient Information 2015 [place], [place]. This information is not intended to replace advice given to you by your health care provider. Make sure you discuss any questions you have with your health care provider.

## 2015-01-14 ENCOUNTER — Other Ambulatory Visit: Payer: Self-pay

## 2015-01-14 DIAGNOSIS — I5023 Acute on chronic systolic (congestive) heart failure: Secondary | ICD-10-CM

## 2015-01-14 DIAGNOSIS — R0602 Shortness of breath: Secondary | ICD-10-CM

## 2015-01-14 DIAGNOSIS — I471 Supraventricular tachycardia: Secondary | ICD-10-CM

## 2015-01-26 ENCOUNTER — Ambulatory Visit (INDEPENDENT_AMBULATORY_CARE_PROVIDER_SITE_OTHER): Payer: Medicare Other

## 2015-01-26 DIAGNOSIS — R0602 Shortness of breath: Secondary | ICD-10-CM | POA: Diagnosis not present

## 2015-01-26 DIAGNOSIS — I5023 Acute on chronic systolic (congestive) heart failure: Secondary | ICD-10-CM | POA: Diagnosis not present

## 2015-01-26 DIAGNOSIS — I471 Supraventricular tachycardia: Secondary | ICD-10-CM | POA: Diagnosis not present

## 2015-01-27 ENCOUNTER — Encounter: Payer: Self-pay | Admitting: Cardiology

## 2015-02-15 ENCOUNTER — Telehealth (HOSPITAL_COMMUNITY): Payer: Self-pay | Admitting: Vascular Surgery

## 2015-02-15 NOTE — Telephone Encounter (Signed)
Left pt message on voicemail to give me a call back to get scheduled

## 2015-03-04 ENCOUNTER — Other Ambulatory Visit: Payer: Self-pay

## 2015-03-04 MED ORDER — NITROGLYCERIN 0.4 MG SL SUBL: 0.4000 mg | SUBLINGUAL_TABLET | SUBLINGUAL | Status: DC | PRN

## 2015-03-04 MED ORDER — CARVEDILOL 25 MG PO TABS: 50.0000 mg | ORAL_TABLET | Freq: Two times a day (BID) | ORAL | Status: DC

## 2015-03-04 MED ORDER — RANOLAZINE ER 500 MG PO TB12: 500.0000 mg | ORAL_TABLET | Freq: Two times a day (BID) | ORAL | Status: DC

## 2015-03-14 ENCOUNTER — Telehealth: Payer: Self-pay | Admitting: Cardiology

## 2015-03-14 NOTE — Telephone Encounter (Signed)
New message     Pt calling to see if procedure has been approved and scheduled.  Please call to advise.

## 2015-03-15 NOTE — Telephone Encounter (Signed)
Left msg for pt to call. 

## 2015-03-17 NOTE — Telephone Encounter (Signed)
LMTCB

## 2015-03-18 ENCOUNTER — Other Ambulatory Visit: Payer: Self-pay

## 2015-03-18 MED ORDER — HYDRALAZINE HCL 50 MG PO TABS: 75.0000 mg | ORAL_TABLET | Freq: Three times a day (TID) | ORAL | Status: DC

## 2015-03-23 ENCOUNTER — Telehealth: Payer: Self-pay

## 2015-03-23 NOTE — Telephone Encounter (Signed)
Pharmacy called for refill for Hydrol,

## 2015-04-06 ENCOUNTER — Other Ambulatory Visit: Payer: Self-pay

## 2015-04-06 MED ORDER — ATORVASTATIN CALCIUM 40 MG PO TABS: 40.0000 mg | ORAL_TABLET | Freq: Every day | ORAL | Status: DC

## 2015-04-06 MED ORDER — POTASSIUM CHLORIDE CRYS ER 20 MEQ PO TBCR: 20.0000 meq | EXTENDED_RELEASE_TABLET | Freq: Two times a day (BID) | ORAL | Status: DC

## 2015-04-06 MED ORDER — ISOSORBIDE MONONITRATE ER 60 MG PO TB24: 60.0000 mg | ORAL_TABLET | Freq: Every day | ORAL | Status: DC

## 2015-05-11 ENCOUNTER — Ambulatory Visit (INDEPENDENT_AMBULATORY_CARE_PROVIDER_SITE_OTHER): Payer: Medicare Other | Admitting: Cardiology

## 2015-05-11 ENCOUNTER — Encounter: Payer: Self-pay | Admitting: Cardiology

## 2015-05-11 VITALS — BP 132/86 | HR 68 | Ht 70.0 in | Wt 223.0 lb

## 2015-05-11 DIAGNOSIS — I471 Supraventricular tachycardia: Secondary | ICD-10-CM

## 2015-05-11 DIAGNOSIS — I5042 Chronic combined systolic (congestive) and diastolic (congestive) heart failure: Secondary | ICD-10-CM | POA: Diagnosis not present

## 2015-05-11 DIAGNOSIS — I2510 Atherosclerotic heart disease of native coronary artery without angina pectoris: Secondary | ICD-10-CM

## 2015-05-11 NOTE — Progress Notes (Signed)
HPI Patient presents for followup coronary disease and cardiomyopathy. He's had very difficult to control hypertension. However, over the past few visits he has been doing very well. He is compliant with medications and visits. He does have transportation problems which may explain why he didn't get an echo I ordered recently.  He did have ventricular ectopy noted when he was in the emergency room to evaluate some elbow pain. He was referred back to her clinic and did come to that appointment and a follow-up echocardiogram recently which demonstrates his EF is remaining low at 20%. The patient denies any new symptoms such as chest discomfort, neck or arm discomfort. There has been no new shortness of breath, PND or orthopnea. There have been no reported palpitations, presyncope or syncope.  He seems to be very compliant with his medications and his lifestyle is improved. He walks routinely with his daughters and his sister.  He might have some mild shortness of breath class II.  No Known Allergies  Current Outpatient Prescriptions  Medication Sig Dispense Refill  . aspirin 325 MG EC tablet Take 325 mg by mouth daily.    Marland Kitchen atorvastatin (LIPITOR) 40 MG tablet Take 1 tablet (40 mg total) by mouth daily at 6 PM. 30 tablet 5  . calcitRIOL (ROCALTROL) 0.25 MCG capsule Take 0.25 mcg by mouth 3 (three) times a week. On Monday, Wednesday, and Friday.    . carvedilol (COREG) 25 MG tablet Take 2 tablets (50 mg total) by mouth 2 (two) times daily with a meal. 120 tablet 11  . cholecalciferol (VITAMIN D) 1000 UNITS tablet Take 1,000 Units by mouth daily.    . furosemide (LASIX) 40 MG tablet Take 1 tablet (40 mg total) by mouth daily. 30 tablet 0  . hydrALAZINE (APRESOLINE) 50 MG tablet Take 1.5 tablets (75 mg total) by mouth 3 (three) times daily. 135 tablet 5  . HYDROcodone-acetaminophen (NORCO/VICODIN) 5-325 MG per tablet Take 1 tablet by mouth every 4 (four) hours as needed for moderate pain. 15 tablet 0  .  isosorbide mononitrate (IMDUR) 60 MG 24 hr tablet Take 1 tablet (60 mg total) by mouth daily. 30 tablet 5  . nitroGLYCERIN (NITROSTAT) 0.4 MG SL tablet Place 1 tablet (0.4 mg total) under the tongue every 5 (five) minutes as needed for chest pain. 25 tablet 5  . potassium chloride SA (K-DUR,KLOR-CON) 20 MEQ tablet Take 1 tablet (20 mEq total) by mouth 2 (two) times daily. 60 tablet 5  . ranolazine (RANEXA) 500 MG 12 hr tablet Take 1 tablet (500 mg total) by mouth 2 (two) times daily. 60 tablet 11  . torsemide (DEMADEX) 20 MG tablet Take 20 mg by mouth 2 (two) times daily.     No current facility-administered medications for this visit.    Past Medical History  Diagnosis Date  . CAD (coronary artery disease)     a. NSTEMI 2008: in setting of profound epistaxis/anemia, cath showing severe diffuse RCA dz with L-R collaterals, nonobst dz in left system, managed medically. b. Type II NSTEMI 10/2008. c. Last cath 2012 - for med rx. d. 05/2013: elevated troponin ? due to ARF.  e. Ranexa added 03/2012.  Marland Kitchen Hypertension     a. Difficult to control. b. Renal duplex neg for RAS 11/2012.  Marland Kitchen Hyperlipidemia   . Chronic kidney disease     a. Average creatine of about 2.3  . Cardiomyopathy     a. Mixed ICM/NICM - Prior EF 25% back to 2008. b.  Improved to 55% in 2012. c. Subsequent echoes: 45% in 11/2010, 15% by echo 05/2013.  Marland Kitchen Anemia     a. 2008 in setting of profound epistaxis.  Marland Kitchen History of pneumonia 08/2012, 05/2013  . Epistaxis     a. 2008, 2009 a/w significant anemia.  Marland Kitchen SVT (supraventricular tachycardia)     a. 05/2013 tx with  adenosine in ER.  . CHF (congestive heart failure)     EF 15%    Past Surgical History  Procedure Laterality Date  . Cardiac catheterization  2012    total RCA occlusion with left-to-right collaterals supplying the distal RCA branches, mild diffuse nonobstructive disease of LAD and LCx. Medically managed    ROS:  As stated in the HPI and negative for all other  systems.  PHYSICAL EXAM BP 132/86 mmHg  Pulse 68  Ht  (1.778 m)  Wt 223 lb (101.152 kg)  BMI 32.00 kg/m2  SpO2 94% GENERAL:  Well appearing NECK:  No jugular venous distention, waveform within normal limits, carotid upstroke brisk and symmetric, no bruits, no thyromegaly LUNGS:  Clear to auscultation bilaterally BACK:  No CVA tenderness CHEST:  Unremarkable HEART:  PMI not displaced or sustained,S1 and S2 within normal limits, no S3, no S4, no clicks, no rubs, no murmurs ABD:  Flat, positive bowel sounds normal in frequency in pitch, no bruits, no rebound, no guarding, no midline pulsatile mass, no hepatomegaly, no splenomegaly EXT:  2 plus pulses throughout, no edema, no cyanosis no clubbing  EKG:  Sinus rhythm, rate 62, axis within normal limits, poor anterior R wave progression, QTC prolonged, old inferior infarct.  05/11/2015  ASSESSMENT AND PLAN  CAD -  Given the absence of symptoms no change in therapy is indicated. No further imaging is indicated.  CHRONIC SYSTOLIC HEART FAILURE -  His blood pressure his ejection fraction remains low. I'm delighted that he has few symptoms.  He has been very compliant with his medications. I had a long discussion with the patient and his sister and ICD. I will refer him to EP for evaluation to consider this. He has a mixed cardiomyopathy but I think it's fair to consider it ischemic. In the future I probably will order a CPX to further evaluate for advanced therapies although follow-up in the heart failure clinic would be difficult for him because of transportation issues.  CHRONIC KIDNEY DISEASE UNSPECIFIED - He is followed also by nephrology.  I will defer follow-up of his BMET to nephrology.  HYPERTENSION - This is being managed in the context of treating his CHF

## 2015-05-11 NOTE — Patient Instructions (Addendum)
Medication Instructions:  The current medical regimen is effective;  continue present plan and medications.  You have been referred to electrophysiology in the [place] office for the evaluation ICD placement.  The [facility] office will call you with an appointment.  Follow-Up: Follow up with Dr Antoine Poche in 2 to 3 months in [place].  (after seeing EP)  Thank you for choosing [facility]!!

## 2015-06-08 ENCOUNTER — Institutional Professional Consult (permissible substitution): Payer: Medicare Other | Admitting: Internal Medicine

## 2015-06-13 ENCOUNTER — Institutional Professional Consult (permissible substitution): Payer: Medicare Other | Admitting: Internal Medicine

## 2015-07-11 ENCOUNTER — Ambulatory Visit (INDEPENDENT_AMBULATORY_CARE_PROVIDER_SITE_OTHER): Payer: Medicare Other | Admitting: Internal Medicine

## 2015-07-11 ENCOUNTER — Encounter: Payer: Self-pay | Admitting: Internal Medicine

## 2015-07-11 VITALS — BP 132/90 | HR 64 | Ht 70.0 in | Wt 234.4 lb

## 2015-07-11 DIAGNOSIS — N183 Chronic kidney disease, stage 3 unspecified: Secondary | ICD-10-CM

## 2015-07-11 DIAGNOSIS — I5042 Chronic combined systolic (congestive) and diastolic (congestive) heart failure: Secondary | ICD-10-CM

## 2015-07-11 DIAGNOSIS — I110 Hypertensive heart disease with heart failure: Secondary | ICD-10-CM

## 2015-07-11 DIAGNOSIS — I2510 Atherosclerotic heart disease of native coronary artery without angina pectoris: Secondary | ICD-10-CM

## 2015-07-11 NOTE — Progress Notes (Signed)
Electrophysiology Office Note   Date:  07/11/2015   ID:  [name], DOB 1965-09-25, MRN [id number]  PCP:  Josue Hector, MD  Cardiologist:  Dr Antoine Poche Primary Electrophysiologist: Hillis Range, MD    Chief Complaint  Patient presents with  . SVT  . Chronic combined systolic & diastolic CHF     History of Present Illness: [name] is a 49 y.o. male who presents today for electrophysiology evaluation.   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  He has chronically occluded RCA with L->R collaterals.  He also has likely a hypertensive cardiomyopathy.  He has advanced renal disease with Cr 3.  He has been compliant recently with medical therapy but has a persistently depressed ejection fraction.  He is followed closely by Dr Antoine Poche who refers him today for EP consultation regarding risks of sudden death and consideration of an ICD. Today, he denies symptoms of palpitations, chest pain, shortness of breath, orthopnea, PND, lower extremity edema, claudication, dizziness, presyncope, syncope, bleeding, or neurologic sequela. The patient is tolerating medications without difficulties and is otherwise without complaint today.    Past Medical History  Diagnosis Date  . CAD (coronary artery disease)     a. NSTEMI 2008: in setting of profound epistaxis/anemia, cath showing severe diffuse RCA dz with L-R collaterals, nonobst dz in left system, managed medically. b. Type II NSTEMI 10/2008. c. Last cath 2012 - for med rx. d. 05/2013: elevated troponin ? due to ARF.  e. Ranexa added 03/2012.  Marland Kitchen Hypertension     a. Difficult to control. b. Renal duplex neg for RAS 11/2012.  Marland Kitchen Hyperlipidemia   . Chronic kidney disease     a. Average creatine of about 2.3  . Cardiomyopathy (HCC)     a. Mixed ICM/NICM - Prior EF 25% back to 2008. b. Improved to 55% in 2012. c. Subsequent echoes: 45% in 11/2010, 15% by echo 05/2013.and 2016  . Anemia     a. 2008 in  setting of profound epistaxis.  Marland Kitchen History of pneumonia 08/2012, 05/2013  . Epistaxis     a. 2008, 2009 a/w significant anemia.  Marland Kitchen SVT (supraventricular tachycardia) (HCC)     a. 05/2013 tx with 6mg  adenosine in ER.  . CHF (congestive heart failure) [facility])    Past Surgical History  Procedure Laterality Date  . Cardiac catheterization  2012    total RCA occlusion with left-to-right collaterals supplying the distal RCA branches, mild diffuse nonobstructive disease of LAD and LCx. Medically managed     Current Outpatient Prescriptions  Medication Sig Dispense Refill  . aspirin 325 MG EC tablet Take 325 mg by mouth daily.    Marland Kitchen atorvastatin (LIPITOR) 40 MG tablet Take 1 tablet (40 mg total) by mouth daily at 6 PM. 30 tablet 5  . budesonide-formoterol (SYMBICORT) 160-4.5 MCG/ACT inhaler Inhale 2 puffs into the lungs 2 (two) times daily.    . calcitRIOL (ROCALTROL) 0.25 MCG capsule Take 0.25 mcg by mouth 3 (three) times a week. On Monday, Wednesday, and Friday.    . carvedilol (COREG) 25 MG tablet Take 2 tablets (50 mg total) by mouth 2 (two) times daily with a meal. 120 tablet 11  . cholecalciferol (VITAMIN D) 1000 UNITS tablet Take 1,000 Units by mouth daily.    . Febuxostat (ULORIC) 80 MG TABS Take 80 mg by mouth daily.    . furosemide (LASIX) 40 MG tablet Take 1 tablet (40 mg total) by mouth  daily. 30 tablet 0  . HYDROcodone-acetaminophen (NORCO/VICODIN) 5-325 MG per tablet Take 1 tablet by mouth every 4 (four) hours as needed for moderate pain. 15 tablet 0  . isosorbide mononitrate (IMDUR) 60 MG 24 hr tablet Take 1 tablet (60 mg total) by mouth daily. 30 tablet 5  . nitroGLYCERIN (NITROSTAT) 0.4 MG SL tablet Place 1 tablet (0.4 mg total) under the tongue every 5 (five) minutes as needed for chest pain. 25 tablet 5  . potassium chloride SA (K-DUR,KLOR-CON) 20 MEQ tablet Take 1 tablet (20 mEq total) by mouth 2 (two) times daily. 60 tablet 5  . ranolazine (RANEXA) 500 MG 12 hr tablet Take 1  tablet (500 mg total) by mouth 2 (two) times daily. 60 tablet 11  . torsemide (DEMADEX) 20 MG tablet Take 20 mg by mouth 2 (two) times daily.     No current facility-administered medications for this visit.    Allergies:   Review of patient's allergies indicates no known allergies.   Social History:  The patient  reports that he quit smoking about 8 years ago. His smoking use included Cigarettes. He has a 10 pack-year smoking history. He has never used smokeless tobacco. He reports that he uses illicit drugs. He reports that he does not drink alcohol.   Family History:  The patient's  family history includes Diabetes in his mother; Heart attack in his father; Heart disease in his mother; Hypertension in his mother.    ROS:  Please see the history of present illness.   All other systems are reviewed and negative.    PHYSICAL EXAM: VS:  BP 132/90 mmHg  Pulse 64  Ht  (1.778 m)  Wt 234 lb 6.4 oz (106.323 kg)  BMI 33.63 kg/m2 , BMI Body mass index is 33.63 kg/(m^2). GEN: Well nourished, well developed, in no acute distress HEENT: normal Neck: no JVD, carotid bruits, or masses Cardiac: RRR; no murmurs, rubs, or gallops,no edema  Respiratory:  clear to auscultation bilaterally, normal work of breathing GI: soft, nontender, nondistended, + BS MS: no deformity or atrophy Skin: warm and dry  Neuro:  Strength and sensation are intact Psych: euthymic mood, full affect  EKG:  EKG is ordered today. The ekg ordered today shows sinus rhythm 64 bpm with PACs, anterolateral infarction, diffuse twi,. QRS 114 msec   Recent Labs: 01/04/2015: Hemoglobin 12.3*; Platelets 214 01/13/2015: BUN 25*; Creat 2.87*; Potassium 4.4; Sodium 138    Lipid Panel     Component Value Date/Time   CHOL  02/22/2010 0520    121        ATP III CLASSIFICATION:  <200     mg/dL   Desirable  454-098  mg/dL   Borderline High  >=119    mg/dL   High          TRIG 60 02/22/2010 0520   HDL 31* 02/22/2010 0520    CHOLHDL 3.9 02/22/2010 0520   VLDL 12 02/22/2010 0520   LDLCALC  02/22/2010 0520    78        Total Cholesterol/HDL:CHD Risk Coronary Heart Disease Risk Table                     Men   Women  1/2 Average Risk   3.4   3.3  Average Risk       5.0   4.4  2 X Average Risk   9.6   7.1  3 X Average Risk  23.4  11.0        Use the calculated Patient Ratio above and the CHD Risk Table to determine the patient's CHD Risk.        ATP III CLASSIFICATION (LDL):  <100     mg/dL   Optimal  161-096  mg/dL   Near or Above                    Optimal  130-159  mg/dL   Borderline  045-409  mg/dL   High  >811     mg/dL   Very High     Wt Readings from Last 3 Encounters:  07/11/15 234 lb 6.4 oz (106.323 kg)  05/11/15 223 lb (101.152 kg)  01/13/15 221 lb (100.245 kg)      Other studies Reviewed: Additional studies/ records that were reviewed today include: Dr Lindaann Slough notes, echo  Review of the above records today demonstrates: EF 15-20%   ASSESSMENT AND PLAN:  1.  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  He is not on ace inhibitor due to renal failure but has been treated with nitrates/ hydralazine for afterload reduction.  BP appears to be well controlled presently.  At this time, he meets MADIT II/SCD-HeFT criteria for ICD implantation for primary prevention of sudden death.  Risks, benefits, alternatives to ICD implantation were discussed in detail with the patient today.  His QRS is narrow and therefore he does not meet criteria for CRT.  He is clear that he has no interest in proceeding with an ICD at this time.  He will think about this further and contact our office should he decide to proceed.  As he has advanced renal failure, should he decide to proceed with an ICD, I would advise that he be evaluated by Dr Graciela Husbands for a subcutaneous ICD given anticipated issues with  dialysis access and infectious risks thereafter.  He will follow-up with Dr Antoine Poche as scheduled I will see as needed going forward.  Current medicines are reviewed at length with the patient today.   The patient does not have concerns regarding his medicines.  The following changes were made today:  none  Signed, Hillis Range, MD  07/11/2015 10:47 AM     [facility] [address] [place] [address] [phone] (office) [phone] (fax)

## 2015-07-11 NOTE — Patient Instructions (Signed)
Medication Instructions:  Your physician recommends that you continue on your current medications as directed. Please refer to the Current Medication list given to you today.   Labwork: None ordered   Testing/Procedures: None ordered   Follow-Up: Your physician recommends that you schedule a follow-up appointment next available with Dr Antoine Poche in [place] and as needed with Dr Johney Frame   Any Other Special Instructions Will Be Listed Below (If Applicable).     If you need a refill on your cardiac medications before your next appointment, please call your pharmacy.

## 2015-08-17 ENCOUNTER — Encounter: Payer: Self-pay | Admitting: Cardiology

## 2015-08-17 ENCOUNTER — Ambulatory Visit (INDEPENDENT_AMBULATORY_CARE_PROVIDER_SITE_OTHER): Payer: Medicare Other | Admitting: Cardiology

## 2015-08-17 VITALS — BP 150/101 | HR 64 | Ht 70.0 in | Wt 229.0 lb

## 2015-08-17 DIAGNOSIS — I420 Dilated cardiomyopathy: Secondary | ICD-10-CM | POA: Diagnosis not present

## 2015-08-17 DIAGNOSIS — I2510 Atherosclerotic heart disease of native coronary artery without angina pectoris: Secondary | ICD-10-CM

## 2015-08-17 NOTE — Progress Notes (Signed)
HPI Patient presents for followup coronary disease and cardiomyopathy. He's had very difficult to control hypertension. However, over the past several visits he has been doing very well. He is compliant with medications and visits. Most recent echocardiogram demonstrate his EF is remaining low at 20%. The patient denies any new symptoms such as chest discomfort, neck or arm discomfort. There has been no new shortness of breath, PND or orthopnea. There have been no reported palpitations, presyncope or syncope.  He seems to be very compliant with his medications and his lifestyle is improved. He walks routinely with his daughters and his sister.  I did send him for evaluation of an ICD but ultimately the patient decided against having this.  I reviewed the EP note.   No Known Allergies  Current Outpatient Prescriptions  Medication Sig Dispense Refill  . aspirin 325 MG EC tablet Take 325 mg by mouth daily.    Marland Kitchen atorvastatin (LIPITOR) 40 MG tablet Take 1 tablet (40 mg total) by mouth daily at 6 PM. 30 tablet 5  . budesonide-formoterol (SYMBICORT) 160-4.5 MCG/ACT inhaler Inhale 2 puffs into the lungs 2 (two) times daily.    . calcitRIOL (ROCALTROL) 0.25 MCG capsule Take 0.25 mcg by mouth 3 (three) times a week. On Monday, Wednesday, and Friday.    . carvedilol (COREG) 25 MG tablet Take 2 tablets (50 mg total) by mouth 2 (two) times daily with a meal. 120 tablet 11  . cholecalciferol (VITAMIN D) 1000 UNITS tablet Take 1,000 Units by mouth daily.    . Febuxostat (ULORIC) 80 MG TABS Take 80 mg by mouth daily.    . hydrALAZINE (APRESOLINE) 100 MG tablet 100 mg daily.     Marland Kitchen HYDROcodone-acetaminophen (NORCO/VICODIN) 5-325 MG per tablet Take 1 tablet by mouth every 4 (four) hours as needed for moderate pain. 15 tablet 0  . isosorbide mononitrate (IMDUR) 60 MG 24 hr tablet Take 1 tablet (60 mg total) by mouth daily. 30 tablet 5  . nitroGLYCERIN (NITROSTAT) 0.4 MG SL tablet Place 1 tablet (0.4 mg total) under  the tongue every 5 (five) minutes as needed for chest pain. 25 tablet 5  . potassium chloride SA (K-DUR,KLOR-CON) 20 MEQ tablet Take 1 tablet (20 mEq total) by mouth 2 (two) times daily. 60 tablet 5  . predniSONE (DELTASONE) 20 MG tablet Take 20 mg by mouth 2 (two) times daily with a meal.     . ranolazine (RANEXA) 500 MG 12 hr tablet Take 1 tablet (500 mg total) by mouth 2 (two) times daily. 60 tablet 11  . torsemide (DEMADEX) 20 MG tablet Take 20 mg by mouth 2 (two) times daily.     No current facility-administered medications for this visit.    Past Medical History  Diagnosis Date  . CAD (coronary artery disease)     a. NSTEMI 2008: in setting of profound epistaxis/anemia, cath showing severe diffuse RCA dz with L-R collaterals, nonobst dz in left system, managed medically. b. Type II NSTEMI 10/2008. c. Last cath 2012 - for med rx. d. 05/2013: elevated troponin ? due to ARF.  e. Ranexa added 03/2012.  Marland Kitchen Hypertension     a. Difficult to control. b. Renal duplex neg for RAS 11/2012.  Marland Kitchen Hyperlipidemia   . Chronic kidney disease     a. Average creatine of about 2.3  . Cardiomyopathy (HCC)     a. Mixed ICM/NICM - Prior EF 25% back to 2008. b. Improved to 55% in 2012. c. Subsequent echoes: 45%  in 11/2010, 15% by echo 05/2013.and 2016  . Anemia     a. 2008 in setting of profound epistaxis.  Marland Kitchen History of pneumonia 08/2012, 05/2013  . Epistaxis     a. 2008, 2009 a/w significant anemia.  Marland Kitchen SVT (supraventricular tachycardia) (HCC)     a. 05/2013 tx with 6mg  adenosine in ER.  . CHF (congestive heart failure) [facility])     Past Surgical History  Procedure Laterality Date  . Cardiac catheterization  2012    total RCA occlusion with left-to-right collaterals supplying the distal RCA branches, mild diffuse nonobstructive disease of LAD and LCx. Medically managed    ROS:  As stated in the HPI and negative for all other systems.  PHYSICAL EXAM BP 150/101 mmHg  Pulse 64  Ht 5\' 10"  (1.778 m)  Wt 229 lb  (103.874 kg)  BMI 32.86 kg/m2 GENERAL:  Well appearing NECK:  No jugular venous distention, waveform within normal limits, carotid upstroke brisk and symmetric, no bruits, no thyromegaly LUNGS:  Clear to auscultation bilaterally BACK:  No CVA tenderness CHEST:  Unremarkable HEART:  PMI not displaced or sustained,S1 and S2 within normal limits, no S3, no S4, no clicks, no rubs, no murmurs ABD:  Flat, positive bowel sounds normal in frequency in pitch, no bruits, no rebound, no guarding, no midline pulsatile mass, no hepatomegaly, no splenomegaly EXT:  2 plus pulses throughout, no edema, no cyanosis no clubbing   ASSESSMENT AND PLAN  CAD -  Given the absence of symptoms no change in therapy is indicated. No further imaging is indicated.  CHRONIC SYSTOLIC HEART FAILURE -  He seems to be euvolemic. He will continue the meds as listed.  CHRONIC KIDNEY DISEASE UNSPECIFIED - He is followed also by nephrology.  I will defer follow-up of his BMET to nephrology.  He has an appointment with them early next year.  HYPERTENSION - This is being managed in the context of treating his CHF. It is slightly elevated today but he gets checked by a and she will alert me if it is consistently running high.

## 2015-08-17 NOTE — Patient Instructions (Signed)
Medication Instructions:  The current medical regimen is effective;  continue present plan and medications.  Follow-Up: Follow up in 6 months with Dr. Hochrein.  You will receive a letter in the mail 2 months before you are due.  Please call us when you receive this letter to schedule your follow up appointment.  If you need a refill on your cardiac medications before your next appointment, please call your pharmacy.  Thank you for choosing [facility]!!       

## 2015-09-22 ENCOUNTER — Telehealth: Payer: Self-pay | Admitting: Cardiology

## 2015-09-22 MED ORDER — HYDRALAZINE HCL 100 MG PO TABS
100.0000 mg | ORAL_TABLET | Freq: Every day | ORAL | Status: DC
Start: 2015-09-22 — End: 2016-11-21

## 2015-09-22 NOTE — Telephone Encounter (Signed)
*  STAT* If patient is at the pharmacy, call can be transferred to refill team.   1. Which medications need to be refilled? (please list name of each medication and dose if known) hydralozine 50 mg  2. Which pharmacy/location (including street and city if local pharmacy) is medication to be sent to? [facility]  3. Do they need a 30 day or 90 day supply? 90

## 2015-10-04 ENCOUNTER — Other Ambulatory Visit: Payer: Self-pay | Admitting: *Deleted

## 2015-10-04 MED ORDER — ISOSORBIDE MONONITRATE ER 60 MG PO TB24: 60.0000 mg | ORAL_TABLET | Freq: Every day | ORAL | Status: DC

## 2015-10-04 MED ORDER — ATORVASTATIN CALCIUM 40 MG PO TABS: 40.0000 mg | ORAL_TABLET | Freq: Every day | ORAL | Status: DC

## 2015-10-04 MED ORDER — POTASSIUM CHLORIDE CRYS ER 20 MEQ PO TBCR: 20.0000 meq | EXTENDED_RELEASE_TABLET | Freq: Two times a day (BID) | ORAL | Status: DC

## 2016-01-13 ENCOUNTER — Other Ambulatory Visit: Payer: Self-pay | Admitting: Cardiology

## 2016-01-13 NOTE — Telephone Encounter (Signed)
Rx(s) sent to pharmacy electronically.  

## 2016-03-02 ENCOUNTER — Other Ambulatory Visit: Payer: Self-pay | Admitting: Cardiology

## 2016-04-13 ENCOUNTER — Other Ambulatory Visit: Payer: Self-pay | Admitting: Cardiology

## 2016-04-13 NOTE — Telephone Encounter (Signed)
REFILL 

## 2016-05-15 ENCOUNTER — Other Ambulatory Visit: Payer: Self-pay | Admitting: Cardiology

## 2016-06-13 ENCOUNTER — Other Ambulatory Visit: Payer: Self-pay | Admitting: Cardiology

## 2016-07-11 ENCOUNTER — Other Ambulatory Visit: Payer: Self-pay | Admitting: *Deleted

## 2016-07-11 MED ORDER — NITROGLYCERIN 0.4 MG SL SUBL: 0.4000 mg | SUBLINGUAL_TABLET | SUBLINGUAL | 0 refills | Status: AC | PRN

## 2016-07-11 NOTE — Telephone Encounter (Signed)
REFILL 

## 2016-07-20 ENCOUNTER — Other Ambulatory Visit: Payer: Self-pay | Admitting: Cardiology

## 2016-08-08 ENCOUNTER — Ambulatory Visit: Payer: Medicare Other | Admitting: Cardiology

## 2016-09-25 NOTE — Progress Notes (Deleted)
HPI Patient presents for followup coronary disease and cardiomyopathy. He's had very difficult to control hypertension. His most recent echocardiogram demonstrate his EF is remaining low at 20%. He returns for follow up.  ***   The patient denies any new symptoms such as chest discomfort, neck or arm discomfort. There has been no new shortness of breath, PND or orthopnea. There have been no reported palpitations, presyncope or syncope.  He seems to be very compliant with his medications and his lifestyle is improved. He walks routinely with his daughters and his sister.  I did send him for evaluation of an ICD but ultimately the patient decided against having this.  I reviewed the EP note.   No Known Allergies  Current Outpatient Prescriptions  Medication Sig Dispense Refill  . aspirin 325 MG EC tablet Take 325 mg by mouth daily.    Marland Kitchen atorvastatin (LIPITOR) 40 MG tablet TAKE 1 TABLET BY MOUTH DAILY AT 6PM. PLEASE SCHEDULE FOLLOW UP APPOINTMENT 30 tablet 0  . calcitRIOL (ROCALTROL) 0.25 MCG capsule Take 0.25 mcg by mouth 3 (three) times a week. On Monday, Wednesday, and Friday.    . carvedilol (COREG) 25 MG tablet TAKE 2 TABLETS BY MOUTH TWO TIMES DAILY WITH A MEAL 120 tablet 4  . cholecalciferol (VITAMIN D) 1000 UNITS tablet Take 1,000 Units by mouth daily.    . Febuxostat (ULORIC) 80 MG TABS Take 80 mg by mouth daily.    . hydrALAZINE (APRESOLINE) 100 MG tablet Take 1 tablet (100 mg total) by mouth daily. 90 tablet 1  . HYDROcodone-acetaminophen (NORCO/VICODIN) 5-325 MG per tablet Take 1 tablet by mouth every 4 (four) hours as needed for moderate pain. 15 tablet 0  . isosorbide mononitrate (IMDUR) 60 MG 24 hr tablet TAKE ONE TABLET BY MOUTH EVERY DAY ***PATIENT NEEDS OFFICE VISIT**** 30 tablet 0  . nitroGLYCERIN (NITROSTAT) 0.4 MG SL tablet Place 1 tablet (0.4 mg total) under the tongue every 5 (five) minutes as needed for chest pain. KEEP OV. 25 tablet 0  . potassium chloride SA  (K-DUR,KLOR-CON) 20 MEQ tablet TAKE 1 TABLET BY MOUTH TWO TIMES DAILY 60 tablet 4  . predniSONE (DELTASONE) 20 MG tablet Take 20 mg by mouth 2 (two) times daily with a meal.     . RANEXA 500 MG 12 hr tablet TAKE 1 TABLET BY MOUTH TWO TIMES DAILY 60 tablet 4  . torsemide (DEMADEX) 20 MG tablet Take 20 mg by mouth 2 (two) times daily.     No current facility-administered medications for this visit.     Past Medical History:  Diagnosis Date  . Anemia    a. 2008 in setting of profound epistaxis.  Marland Kitchen CAD (coronary artery disease)    a. NSTEMI 2008: in setting of profound epistaxis/anemia, cath showing severe diffuse RCA dz with L-R collaterals, nonobst dz in left system, managed medically. b. Type II NSTEMI 10/2008. c. Last cath 2012 - for med rx. d. 05/2013: elevated troponin ? due to ARF.  e. Ranexa added 03/2012.  . Cardiomyopathy (HCC)    a. Mixed ICM/NICM - Prior EF 25% back to 2008. b. Improved to 55% in 2012. c. Subsequent echoes: 45% in 11/2010, 15% by echo 05/2013.and 2016  . CHF (congestive heart failure) (HCC)   . Chronic kidney disease    a. Average creatine of about 2.3  . Epistaxis    a. 2008, 2009 a/w significant anemia.  Marland Kitchen History of pneumonia 08/2012, 05/2013  . Hyperlipidemia   . Hypertension  a. Difficult to control. b. Renal duplex neg for RAS 11/2012.  Marland Kitchen SVT (supraventricular tachycardia) (HCC)    a. 05/2013 tx with 6mg  adenosine in ER.    Past Surgical History:  Procedure Laterality Date  . CARDIAC CATHETERIZATION  2012   total RCA occlusion with left-to-right collaterals supplying the distal RCA branches, mild diffuse nonobstructive disease of LAD and LCx. Medically managed    ROS:  ***  As stated in the HPI and negative for all other systems.  PHYSICAL EXAM There were no vitals taken for this visit. GENERAL:  Well appearing NECK:  No jugular venous distention, waveform within normal limits, carotid upstroke brisk and symmetric, no bruits, no thyromegaly LUNGS:   Clear to auscultation bilaterally BACK:  No CVA tenderness CHEST:  Unremarkable HEART:  PMI not displaced or sustained,S1 and S2 within normal limits, no S3, no S4, no clicks, no rubs, no murmurs ABD:  Flat, positive bowel sounds normal in frequency in pitch, no bruits, no rebound, no guarding, no midline pulsatile mass, no hepatomegaly, no splenomegaly EXT:  2 plus pulses throughout, no edema, no cyanosis no clubbing  EKG:  ***  ASSESSMENT AND PLAN  CAD -  Given the absence of symptoms no change in therapy is indicated. No further imaging is indicated.  CHRONIC SYSTOLIC HEART FAILURE -  He seems to be euvolemic. He will continue the meds as listed.  I sent him for an EP evaluation or possible ICD but the patient decided against this.  ***  CHRONIC KIDNEY DISEASE UNSPECIFIED - He is followed also by nephrology.  I will defer follow-up of his BMET to nephrology.  He has an appointment with them early next year.  HYPERTENSION - This is being managed in the context of treating his CHF. It is slightly elevated today but he gets checked by a and he will alert me if it is consistently running high.  ***

## 2016-09-26 ENCOUNTER — Ambulatory Visit: Payer: Medicare Other | Admitting: Cardiology

## 2016-09-26 ENCOUNTER — Encounter: Payer: Self-pay | Admitting: *Deleted

## 2016-11-20 NOTE — Progress Notes (Signed)
Cardiology Office Note   Date:  11/22/2016   ID:  [name], DOB August 16, 1966, MRN [id number]  PCP:  Josue Hector, MD  Cardiologist:   Rollene Rotunda, MD    Chief Complaint  Patient presents with  . Cardiomyopathy      History of Present Illness: [name] is a 51 y.o. male who presents who presents for followup coronary disease and cardiomyopathy. He's had very difficult to control hypertension. Most recent echocardiogram demonstrate his EF is remaining low at 20%.  The patient denies any new symptoms such as chest discomfort, neck or arm discomfort. There has been no new shortness of breath, PND or orthopnea. There have been no reported palpitations, presyncope or syncope.  He seems to be very compliant with his medications and his lifestyle is improved. He walks routinely with his daughters and his sister.  I did send him for evaluation of an ICD but ultimately the patient decided against having this.  I reviewed the EP note.    Past Medical History:  Diagnosis Date  . Anemia    a. 2008 in setting of profound epistaxis.  Marland Kitchen CAD (coronary artery disease)    a. NSTEMI 2008: in setting of profound epistaxis/anemia, cath showing severe diffuse RCA dz with L-R collaterals, nonobst dz in left system, managed medically. b. Type II NSTEMI 10/2008. c. Last cath 2012 - for med rx. d. 05/2013: elevated troponin ? due to ARF.  e. Ranexa added 03/2012.  . Cardiomyopathy (HCC)    a. Mixed ICM/NICM - Prior EF 25% back to 2008. b. Improved to 55% in 2012. c. Subsequent echoes: 45% in 11/2010, 15% by echo 05/2013.and 2016  . CHF (congestive heart failure) (HCC)   . Chronic kidney disease    a. Average creatine of about 2.3  . Epistaxis    a. 2008, 2009 a/w significant anemia.  Marland Kitchen History of pneumonia 08/2012, 05/2013  . Hyperlipidemia   . Hypertension    a. Difficult to control. b. Renal duplex neg for RAS 11/2012.  Marland Kitchen SVT (supraventricular tachycardia) (HCC)    a. 05/2013 tx  with 6mg  adenosine in ER.    Past Surgical History:  Procedure Laterality Date  . CARDIAC CATHETERIZATION  2012   total RCA occlusion with left-to-right collaterals supplying the distal RCA branches, mild diffuse nonobstructive disease of LAD and LCx. Medically managed     Current Outpatient Prescriptions  Medication Sig Dispense Refill  . aspirin 325 MG EC tablet Take 325 mg by mouth daily.    Marland Kitchen atorvastatin (LIPITOR) 40 MG tablet TAKE 1 TABLET BY MOUTH DAILY AT 6PM. PLEASE SCHEDULE FOLLOW UP APPOINTMENT 30 tablet 0  . calcitRIOL (ROCALTROL) 0.25 MCG capsule Take 0.25 mcg by mouth 3 (three) times a week. On Monday, Wednesday, and Friday.    . carvedilol (COREG) 25 MG tablet TAKE 2 TABLETS BY MOUTH TWO TIMES DAILY WITH A MEAL 120 tablet 4  . cholecalciferol (VITAMIN D) 1000 UNITS tablet Take 1,000 Units by mouth daily.    . Febuxostat (ULORIC) 80 MG TABS Take 80 mg by mouth daily.    . hydrALAZINE (APRESOLINE) 100 MG tablet Take 1 tablet (100 mg total) by mouth daily. 90 tablet 1  . HYDROcodone-acetaminophen (NORCO/VICODIN) 5-325 MG per tablet Take 1 tablet by mouth every 4 (four) hours as needed for moderate pain. 15 tablet 0  . isosorbide mononitrate (IMDUR) 60 MG 24 hr tablet TAKE ONE TABLET BY MOUTH EVERY DAY PATIENT NEEDS OFFICE VISIT* 30 tablet 0  .  nitroGLYCERIN (NITROSTAT) 0.4 MG SL tablet Place 1 tablet (0.4 mg total) under the tongue every 5 (five) minutes as needed for chest pain. KEEP OV. 25 tablet 0  . potassium chloride SA (K-DUR,KLOR-CON) 20 MEQ tablet TAKE 1 TABLET BY MOUTH TWO TIMES DAILY 60 tablet 4  . RANEXA 500 MG 12 hr tablet TAKE 1 TABLET BY MOUTH TWO TIMES DAILY 60 tablet 4  . torsemide (DEMADEX) 20 MG tablet Take 20 mg by mouth 2 (two) times daily.     No current facility-administered medications for this visit.     Allergies:   Patient has no known allergies.    ROS:  Please see the history of present illness.   Otherwise, review of systems are positive for  none.   All other systems are reviewed and negative.    PHYSICAL EXAM: VS:  BP (!) 160/108   Pulse 80   Ht 5\' 10"  (1.778 m)   Wt 224 lb (101.6 kg)   BMI 32.14 kg/m  , BMI Body mass index is 32.14 kg/m. GENERAL:  Well appearing today NECK:  No jugular venous distention, waveform within normal limits, carotid upstroke brisk and symmetric, no bruits, no thyromegaly LUNGS:  Clear to auscultation bilaterally BACK:  No CVA tenderness CHEST:  Unremarkable HEART:  PMI not displaced or sustained,S1 and S2 within normal limits, no S3, no S4, no clicks, no rubs, no murmurs ABD:  Flat, positive bowel sounds normal in frequency in pitch, no bruits, no rebound, no guarding, no midline pulsatile mass, no hepatomegaly, no splenomegaly EXT:  2 plus pulses throughout, no edema, no cyanosis no clubbing    EKG:  EKG is ordered today. The ekg ordered today demonstrates sinus rhythm, rate 78, interventricular conduction delay, premature ventricular contraction, inferior T-wave inversions unchanged from previous.   Recent Labs: No results found for requested labs within last 8760 hours.    Lipid Panel    Wt Readings from Last 3 Encounters:  11/21/16 224 lb (101.6 kg)  08/17/15 229 lb (103.9 kg)  07/11/15 234 lb 6.4 oz (106.3 kg)      Other studies Reviewed: Additional studies/ records that were reviewed today include: None. Review of the above records demonstrates:  Please see elsewhere in the note.     ASSESSMENT AND PLAN:  CAD -  Given the absence of symptoms no change in therapy is indicated. No further imaging is indicated.    CHRONIC SYSTOLIC HEART FAILURE -  He seems to be euvolemic.  I am going to increase his Imdur to 120 mg daily   He has declined an ICD.    CHRONIC KIDNEY DISEASE UNSPECIFIED - He is followed also by nephrology.  I will try to get his most recent labs.   HYPERTENSION - This is being managed in the context of treating his CHF.  I will increase the Imdur.  I  reinforced that he should be taking his hydralazine 3x daily.    Current medicines are reviewed at length with the patient today.  The patient does not have concerns regarding medicines.  The following changes have been made:  As above.  I will be getting his labs from Josue Hector, MD Labs/ tests ordered today include: None  Orders Placed This Encounter  Procedures  . EKG 12-Lead     Disposition:   FU with me in six months.     Signed, Rollene Rotunda, MD  11/22/2016 5:38 PM    [facility]

## 2016-11-21 ENCOUNTER — Encounter: Payer: Self-pay | Admitting: Cardiology

## 2016-11-21 ENCOUNTER — Ambulatory Visit (INDEPENDENT_AMBULATORY_CARE_PROVIDER_SITE_OTHER): Payer: Medicare Other | Admitting: Cardiology

## 2016-11-21 VITALS — BP 160/108 | HR 80 | Ht 70.0 in | Wt 224.0 lb

## 2016-11-21 DIAGNOSIS — N183 Chronic kidney disease, stage 3 unspecified: Secondary | ICD-10-CM

## 2016-11-21 DIAGNOSIS — I2510 Atherosclerotic heart disease of native coronary artery without angina pectoris: Secondary | ICD-10-CM

## 2016-11-21 DIAGNOSIS — I10 Essential (primary) hypertension: Secondary | ICD-10-CM

## 2016-11-21 DIAGNOSIS — I5042 Chronic combined systolic (congestive) and diastolic (congestive) heart failure: Secondary | ICD-10-CM | POA: Diagnosis not present

## 2016-11-21 DIAGNOSIS — I420 Dilated cardiomyopathy: Secondary | ICD-10-CM

## 2016-11-21 MED ORDER — ISOSORBIDE MONONITRATE ER 120 MG PO TB24: 120.0000 mg | ORAL_TABLET | Freq: Every day | ORAL | 6 refills | Status: DC

## 2016-11-21 MED ORDER — ASPIRIN EC 81 MG PO TBEC: 81.0000 mg | DELAYED_RELEASE_TABLET | Freq: Every day | ORAL | Status: DC

## 2016-11-21 MED ORDER — HYDRALAZINE HCL 100 MG PO TABS: 100.0000 mg | ORAL_TABLET | Freq: Three times a day (TID) | ORAL | 11 refills | Status: DC

## 2016-11-21 NOTE — Patient Instructions (Signed)
Medication Instructions:  Please decrease ASA to 81 mg a day. Increase Hydralazine to 100 mg 3 times a day. Increase Imdur (Isosorbide) to 120 mg a day. Continue all other medications as listed.  Follow-Up: Follow up in 6 months with Dr. Antoine Poche.  You will receive a letter in the mail 2 months before you are due.  Please call us when you receive this letter to schedule your follow up appointment.  If you need a refill on your cardiac medications before your next appointment, please call your pharmacy.  Thank you for choosing [facility]!!

## 2016-11-22 ENCOUNTER — Encounter: Payer: Self-pay | Admitting: Cardiology

## 2017-05-14 ENCOUNTER — Telehealth: Payer: Self-pay | Admitting: Cardiology

## 2017-05-14 MED ORDER — RANOLAZINE ER 500 MG PO TB12: 500.0000 mg | ORAL_TABLET | Freq: Two times a day (BID) | ORAL | 0 refills | Status: DC

## 2017-05-14 MED ORDER — POTASSIUM CHLORIDE CRYS ER 20 MEQ PO TBCR: 20.0000 meq | EXTENDED_RELEASE_TABLET | Freq: Two times a day (BID) | ORAL | 0 refills | Status: DC

## 2017-05-14 MED ORDER — CARVEDILOL 25 MG PO TABS: ORAL_TABLET | ORAL | 0 refills | Status: DC

## 2017-05-14 NOTE — Telephone Encounter (Signed)
Pt's wife calling for refills of Carvedilol 25mg , Potassium 20 meq, and Ranexa 500mg --[facility] 90 days supply-visit 08-07-17

## 2017-06-05 ENCOUNTER — Other Ambulatory Visit: Payer: Self-pay | Admitting: Cardiology

## 2017-07-31 ENCOUNTER — Encounter: Payer: Self-pay | Admitting: Cardiology

## 2017-08-06 ENCOUNTER — Other Ambulatory Visit: Payer: Self-pay | Admitting: Cardiology

## 2017-08-06 NOTE — Progress Notes (Signed)
Cardiology Office Note   Date:  08/08/2017   ID:  [name], DOB November 29, 1965, MRN [id number]  PCP:  Joette Catching, MD  Cardiologist:   Rollene Rotunda, MD    Chief Complaint  Patient presents with  . Coronary Artery Disease      History of Present Illness: [name] is a 51 y.o. male who presents who presents for followup coronary disease and cardiomyopathy. He's had very difficult to control hypertension. Most recent echocardiogram demonstrate his EF is remaining low at 20%.  Since I last saw him he has been doing well.  The patient denies any new symptoms such as chest discomfort, neck or arm discomfort. There has been no new shortness of breath, PND or orthopnea. There have been no reported palpitations, presyncope or syncope.  He unfortunately continues to take his hydralazine once daily.  There seems to be a problem with the pharmacy that sends him his free meds.      Past Medical History:  Diagnosis Date  . Anemia    a. 2008 in setting of profound epistaxis.  Marland Kitchen CAD (coronary artery disease)    a. NSTEMI 2008: in setting of profound epistaxis/anemia, cath showing severe diffuse RCA dz with L-R collaterals, nonobst dz in left system, managed medically. b. Type II NSTEMI 10/2008. c. Last cath 2012 - for med rx. d. 05/2013: elevated troponin ? due to ARF.  e. Ranexa added 03/2012.  . Cardiomyopathy (HCC)    a. Mixed ICM/NICM - Prior EF 25% back to 2008. b. Improved to 55% in 2012. c. Subsequent echoes: 45% in 11/2010, 15% by echo 05/2013.and 2016  . CHF (congestive heart failure) (HCC)   . Chronic kidney disease    a. Average creatine of about 2.3  . Epistaxis    a. 2008, 2009 a/w significant anemia.  Marland Kitchen History of pneumonia 08/2012, 05/2013  . Hyperlipidemia   . Hypertension    a. Difficult to control. b. Renal duplex neg for RAS 11/2012.  Marland Kitchen SVT (supraventricular tachycardia) (HCC)    a. 05/2013 tx with 6mg  adenosine in ER.    Past Surgical History:  Procedure  Laterality Date  . CARDIAC CATHETERIZATION  2012   total RCA occlusion with left-to-right collaterals supplying the distal RCA branches, mild diffuse nonobstructive disease of LAD and LCx. Medically managed     Current Outpatient Prescriptions  Medication Sig Dispense Refill  . aspirin 325 MG EC tablet Take 325 mg by mouth daily.    Marland Kitchen atorvastatin (LIPITOR) 40 MG tablet TAKE 1 TABLET BY MOUTH DAILY AT 6PM. PLEASE SCHEDULE FOLLOW UP APPOINTMENT 30 tablet 0  . calcitRIOL (ROCALTROL) 0.25 MCG capsule Take 0.25 mcg by mouth 3 (three) times a week. On Monday, Wednesday, and Friday.    . carvedilol (COREG) 25 MG tablet TAKE 2 TABLETS BY MOUTH TWO TIMES DAILY WITH A MEAL 120 tablet 4  . cholecalciferol (VITAMIN D) 1000 UNITS tablet Take 1,000 Units by mouth daily.    . Febuxostat (ULORIC) 80 MG TABS Take 80 mg by mouth daily.    . hydrALAZINE (APRESOLINE) 100 MG tablet Take 1 tablet (100 mg total) by mouth daily. 90 tablet 1  . HYDROcodone-acetaminophen (NORCO/VICODIN) 5-325 MG per tablet Take 1 tablet by mouth every 4 (four) hours as needed for moderate pain. 15 tablet 0  . isosorbide mononitrate (IMDUR) 60 MG 24 hr tablet TAKE ONE TABLET BY MOUTH EVERY DAY PATIENT NEEDS OFFICE VISIT* 30 tablet 0  . nitroGLYCERIN (NITROSTAT) 0.4 MG SL  tablet Place 1 tablet (0.4 mg total) under the tongue every 5 (five) minutes as needed for chest pain. KEEP OV. 25 tablet 0  . potassium chloride SA (K-DUR,KLOR-CON) 20 MEQ tablet TAKE 1 TABLET BY MOUTH TWO TIMES DAILY 60 tablet 4  . RANEXA 500 MG 12 hr tablet TAKE 1 TABLET BY MOUTH TWO TIMES DAILY 60 tablet 4  . torsemide (DEMADEX) 20 MG tablet Take 20 mg by mouth 2 (two) times daily.     No current facility-administered medications for this visit.     Allergies:   Patient has no known allergies.    ROS:  Please see the history of present illness.   Otherwise, review of systems are positive for none.   All other systems are reviewed and negative.    PHYSICAL  EXAM: VS:  BP (!) 147/94   Pulse 65   Ht 5\' 10"  (1.778 m)   Wt 210 lb (95.3 kg)   SpO2 98%   BMI 30.13 kg/m  , BMI Body mass index is 30.13 kg/m.  GENERAL:  Well appearing NECK:  No jugular venous distention, waveform within normal limits, carotid upstroke brisk and symmetric, no bruits, no thyromegaly LUNGS:  Clear to auscultation bilaterally CHEST:  Unremarkable HEART:  PMI not displaced or sustained,S1 and S2 within normal limits, no S3, no S4, no clicks, no rubs, no murmurs ABD:  Flat, positive bowel sounds normal in frequency in pitch, no bruits, no rebound, no guarding, no midline pulsatile mass, no hepatomegaly, no splenomegaly EXT:  2 plus pulses throughout, no edema, no cyanosis no clubbing    EKG:  EKG is not ordered today.    Recent Labs: No results found for requested labs within last 8760 hours.    Lipid Panel    Wt Readings from Last 3 Encounters:  08/07/17 210 lb (95.3 kg)  11/21/16 224 lb (101.6 kg)  08/17/15 229 lb (103.9 kg)      Other studies Reviewed: Additional studies/ records that were reviewed today include: None . Review of the above records demonstrates:   ASSESSMENT AND PLAN:  CAD -  He has no symptoms.  No further testing is indicated.    CHRONIC SYSTOLIC HEART FAILURE -  We have not been able to get him to take his hydralazine 3 x daily.  We are going to try to get the number for the pharmacy and call him.  This would be the only change to his meds.    He has declined an ICD.    CHRONIC KIDNEY DISEASE UNSPECIFIED - He is followed also by nephrology.   Creat was stable at 2.45 this summer.    HYPERTENSION - This is being managed in the context of treating his CHF.  It is much better than it was in the past.  I suspect that it will be well controlled if we can get him his hydralazine tid.   Current medicines are reviewed at length with the patient today.  The patient does not have concerns regarding medicines.  The following changes  have been made:  As above.   Labs/ tests ordered today include:    No orders of the defined types were placed in this encounter.    Disposition:   FU with me in 6 months.     Signed, Rollene RotundaJames Vasili Fok, MD  08/08/2017 4:53 PM    [facility]

## 2017-08-07 ENCOUNTER — Ambulatory Visit (INDEPENDENT_AMBULATORY_CARE_PROVIDER_SITE_OTHER): Payer: Medicare Other | Admitting: Cardiology

## 2017-08-07 ENCOUNTER — Encounter: Payer: Self-pay | Admitting: Cardiology

## 2017-08-07 VITALS — BP 147/94 | HR 65 | Ht 70.0 in | Wt 210.0 lb

## 2017-08-07 DIAGNOSIS — I2510 Atherosclerotic heart disease of native coronary artery without angina pectoris: Secondary | ICD-10-CM | POA: Diagnosis not present

## 2017-08-07 DIAGNOSIS — I110 Hypertensive heart disease with heart failure: Secondary | ICD-10-CM

## 2017-08-07 NOTE — Patient Instructions (Signed)
Medication Instructions:  The current medical regimen is effective;  continue present plan and medications.  Follow-Up: Follow up in 6 months with Dr. Hochrein in [place].  You will receive a letter in the mail 2 months before you are due.  Please call us when you receive this letter to schedule your follow up appointment.  If you need a refill on your cardiac medications before your next appointment, please call your pharmacy.  Thank you for choosing [facility]!!     

## 2017-08-08 ENCOUNTER — Encounter: Payer: Self-pay | Admitting: Cardiology

## 2017-08-21 ENCOUNTER — Other Ambulatory Visit: Payer: Self-pay | Admitting: Cardiology

## 2017-09-30 ENCOUNTER — Other Ambulatory Visit: Payer: Self-pay | Admitting: Cardiology

## 2017-09-30 NOTE — Telephone Encounter (Signed)
Rx request sent to pharmacy.  

## 2017-10-24 ENCOUNTER — Encounter (INDEPENDENT_AMBULATORY_CARE_PROVIDER_SITE_OTHER): Payer: Self-pay | Admitting: *Deleted

## 2017-10-28 ENCOUNTER — Other Ambulatory Visit: Payer: Self-pay | Admitting: Cardiology

## 2017-11-01 ENCOUNTER — Telehealth: Payer: Self-pay | Admitting: Cardiology

## 2017-11-01 MED ORDER — RANOLAZINE ER 500 MG PO TB12: ORAL_TABLET | ORAL | 2 refills | Status: AC

## 2017-11-01 MED ORDER — CARVEDILOL 25 MG PO TABS: 50.0000 mg | ORAL_TABLET | Freq: Two times a day (BID) | ORAL | 2 refills | Status: DC

## 2017-11-01 NOTE — Telephone Encounter (Signed)
°*  STAT* If patient is at the pharmacy, call can be transferred to refill team.   1. Which medications need to be refilled? (please list name of each medication and dose if known) RANEXA 500 MG 12 hr tablet   carvedilol (COREG) 25 MG tablet  2. Which pharmacy/location (including street and city if local pharmacy) is medication to be sent to? [facility] 3. Do they need a 30 day or 90 day supply? 90

## 2017-11-25 ENCOUNTER — Other Ambulatory Visit: Payer: Self-pay | Admitting: Cardiology

## 2017-12-30 ENCOUNTER — Inpatient Hospital Stay (HOSPITAL_COMMUNITY): Payer: Medicare Other

## 2017-12-30 ENCOUNTER — Other Ambulatory Visit: Payer: Self-pay

## 2017-12-30 ENCOUNTER — Encounter (HOSPITAL_COMMUNITY): Payer: Self-pay | Admitting: Emergency Medicine

## 2017-12-30 ENCOUNTER — Inpatient Hospital Stay (HOSPITAL_COMMUNITY)
Admission: EM | Admit: 2017-12-30 | Discharge: 2018-01-03 | DRG: 291 | Disposition: A | Discharge status: Home / Self Care | Payer: Medicare Other | Attending: Internal Medicine | Admitting: Internal Medicine

## 2017-12-30 ENCOUNTER — Emergency Department (HOSPITAL_COMMUNITY): Payer: Medicare Other

## 2017-12-30 DIAGNOSIS — I5021 Acute systolic (congestive) heart failure: Secondary | ICD-10-CM | POA: Diagnosis not present

## 2017-12-30 DIAGNOSIS — I5023 Acute on chronic systolic (congestive) heart failure: Secondary | ICD-10-CM | POA: Diagnosis not present

## 2017-12-30 DIAGNOSIS — I130 Hypertensive heart and chronic kidney disease with heart failure and stage 1 through stage 4 chronic kidney disease, or unspecified chronic kidney disease: Secondary | ICD-10-CM | POA: Diagnosis not present

## 2017-12-30 DIAGNOSIS — G8929 Other chronic pain: Secondary | ICD-10-CM | POA: Diagnosis present

## 2017-12-30 DIAGNOSIS — J9601 Acute respiratory failure with hypoxia: Secondary | ICD-10-CM

## 2017-12-30 DIAGNOSIS — I472 Ventricular tachycardia: Secondary | ICD-10-CM | POA: Diagnosis not present

## 2017-12-30 DIAGNOSIS — R778 Other specified abnormalities of plasma proteins: Secondary | ICD-10-CM

## 2017-12-30 DIAGNOSIS — Z87891 Personal history of nicotine dependence: Secondary | ICD-10-CM

## 2017-12-30 DIAGNOSIS — E876 Hypokalemia: Secondary | ICD-10-CM

## 2017-12-30 DIAGNOSIS — I5082 Biventricular heart failure: Secondary | ICD-10-CM | POA: Diagnosis not present

## 2017-12-30 DIAGNOSIS — I43 Cardiomyopathy in diseases classified elsewhere: Secondary | ICD-10-CM | POA: Diagnosis present

## 2017-12-30 DIAGNOSIS — I2510 Atherosclerotic heart disease of native coronary artery without angina pectoris: Secondary | ICD-10-CM | POA: Diagnosis present

## 2017-12-30 DIAGNOSIS — I255 Ischemic cardiomyopathy: Secondary | ICD-10-CM | POA: Diagnosis present

## 2017-12-30 DIAGNOSIS — R748 Abnormal levels of other serum enzymes: Secondary | ICD-10-CM

## 2017-12-30 DIAGNOSIS — N184 Chronic kidney disease, stage 4 (severe): Secondary | ICD-10-CM

## 2017-12-30 DIAGNOSIS — J9600 Acute respiratory failure, unspecified whether with hypoxia or hypercapnia: Secondary | ICD-10-CM | POA: Diagnosis present

## 2017-12-30 DIAGNOSIS — I110 Hypertensive heart disease with heart failure: Secondary | ICD-10-CM | POA: Diagnosis not present

## 2017-12-30 DIAGNOSIS — I519 Heart disease, unspecified: Secondary | ICD-10-CM

## 2017-12-30 DIAGNOSIS — E785 Hyperlipidemia, unspecified: Secondary | ICD-10-CM | POA: Diagnosis present

## 2017-12-30 DIAGNOSIS — I5042 Chronic combined systolic (congestive) and diastolic (congestive) heart failure: Secondary | ICD-10-CM | POA: Diagnosis not present

## 2017-12-30 DIAGNOSIS — D631 Anemia in chronic kidney disease: Secondary | ICD-10-CM | POA: Diagnosis present

## 2017-12-30 DIAGNOSIS — I119 Hypertensive heart disease without heart failure: Secondary | ICD-10-CM | POA: Diagnosis present

## 2017-12-30 DIAGNOSIS — R7989 Other specified abnormal findings of blood chemistry: Secondary | ICD-10-CM

## 2017-12-30 DIAGNOSIS — R079 Chest pain, unspecified: Secondary | ICD-10-CM | POA: Diagnosis present

## 2017-12-30 DIAGNOSIS — Z8249 Family history of ischemic heart disease and other diseases of the circulatory system: Secondary | ICD-10-CM

## 2017-12-30 DIAGNOSIS — N183 Chronic kidney disease, stage 3 unspecified: Secondary | ICD-10-CM | POA: Diagnosis present

## 2017-12-30 DIAGNOSIS — I5043 Acute on chronic combined systolic (congestive) and diastolic (congestive) heart failure: Secondary | ICD-10-CM

## 2017-12-30 DIAGNOSIS — R0602 Shortness of breath: Secondary | ICD-10-CM | POA: Diagnosis not present

## 2017-12-30 DIAGNOSIS — Z7951 Long term (current) use of inhaled steroids: Secondary | ICD-10-CM

## 2017-12-30 DIAGNOSIS — D649 Anemia, unspecified: Secondary | ICD-10-CM | POA: Diagnosis present

## 2017-12-30 DIAGNOSIS — I10 Essential (primary) hypertension: Secondary | ICD-10-CM | POA: Diagnosis not present

## 2017-12-30 DIAGNOSIS — Z8701 Personal history of pneumonia (recurrent): Secondary | ICD-10-CM | POA: Diagnosis not present

## 2017-12-30 DIAGNOSIS — I509 Heart failure, unspecified: Secondary | ICD-10-CM

## 2017-12-30 DIAGNOSIS — I25119 Atherosclerotic heart disease of native coronary artery with unspecified angina pectoris: Secondary | ICD-10-CM | POA: Diagnosis not present

## 2017-12-30 DIAGNOSIS — N179 Acute kidney failure, unspecified: Secondary | ICD-10-CM | POA: Diagnosis present

## 2017-12-30 DIAGNOSIS — I252 Old myocardial infarction: Secondary | ICD-10-CM | POA: Diagnosis not present

## 2017-12-30 DIAGNOSIS — I082 Rheumatic disorders of both aortic and tricuspid valves: Secondary | ICD-10-CM | POA: Diagnosis present

## 2017-12-30 DIAGNOSIS — Z7982 Long term (current) use of aspirin: Secondary | ICD-10-CM | POA: Diagnosis not present

## 2017-12-30 DIAGNOSIS — I25118 Atherosclerotic heart disease of native coronary artery with other forms of angina pectoris: Secondary | ICD-10-CM | POA: Diagnosis not present

## 2017-12-30 DIAGNOSIS — I248 Other forms of acute ischemic heart disease: Secondary | ICD-10-CM | POA: Diagnosis not present

## 2017-12-30 DIAGNOSIS — T502X5A Adverse effect of carbonic-anhydrase inhibitors, benzothiadiazides and other diuretics, initial encounter: Secondary | ICD-10-CM | POA: Diagnosis present

## 2017-12-30 DIAGNOSIS — N189 Chronic kidney disease, unspecified: Secondary | ICD-10-CM

## 2017-12-30 DIAGNOSIS — I493 Ventricular premature depolarization: Secondary | ICD-10-CM | POA: Diagnosis present

## 2017-12-30 DIAGNOSIS — I471 Supraventricular tachycardia: Secondary | ICD-10-CM | POA: Diagnosis present

## 2017-12-30 DIAGNOSIS — I351 Nonrheumatic aortic (valve) insufficiency: Secondary | ICD-10-CM

## 2017-12-30 LAB — I-STAT TROPONIN, ED: Troponin i, poc: 0.1 ng/mL (ref 0.00–0.08)

## 2017-12-30 LAB — CBC WITH DIFFERENTIAL/PLATELET
BASOS PCT: 1 %
Basophils Absolute: 0.1 10*3/uL (ref 0.0–0.1)
EOS ABS: 0.2 10*3/uL (ref 0.0–0.7)
EOS PCT: 3 %
HCT: 36.6 % — ABNORMAL LOW (ref 39.0–52.0)
Hemoglobin: 11.1 g/dL — ABNORMAL LOW (ref 13.0–17.0)
Lymphocytes Relative: 23 %
Lymphs Abs: 1.8 10*3/uL (ref 0.7–4.0)
MCH: 25.6 pg — AB (ref 26.0–34.0)
MCHC: 30.3 g/dL (ref 30.0–36.0)
MCV: 84.3 fL (ref 78.0–100.0)
MONOS PCT: 6 %
Monocytes Absolute: 0.5 10*3/uL (ref 0.1–1.0)
NEUTROS PCT: 67 %
Neutro Abs: 5.1 10*3/uL (ref 1.7–7.7)
PLATELETS: 262 10*3/uL (ref 150–400)
RBC: 4.34 MIL/uL (ref 4.22–5.81)
RDW: 16.6 % — AB (ref 11.5–15.5)
WBC: 7.6 10*3/uL (ref 4.0–10.5)

## 2017-12-30 LAB — COMPREHENSIVE METABOLIC PANEL
ALK PHOS: 64 U/L (ref 38–126)
ALT: 14 U/L — AB (ref 17–63)
AST: 21 U/L (ref 15–41)
Albumin: 3.2 g/dL — ABNORMAL LOW (ref 3.5–5.0)
Anion gap: 12 (ref 5–15)
BUN: 54 mg/dL — AB (ref 6–20)
CO2: 29 mmol/L (ref 22–32)
CREATININE: 3.78 mg/dL — AB (ref 0.61–1.24)
Calcium: 8.4 mg/dL — ABNORMAL LOW (ref 8.9–10.3)
Chloride: 98 mmol/L — ABNORMAL LOW (ref 101–111)
GFR calc non Af Amer: 17 mL/min — ABNORMAL LOW (ref >60)
GFR, EST AFRICAN AMERICAN: 20 mL/min — AB (ref >60)
Glucose, Bld: 194 mg/dL — ABNORMAL HIGH (ref 65–99)
Potassium: 3.2 mmol/L — ABNORMAL LOW (ref 3.5–5.1)
SODIUM: 139 mmol/L (ref 135–145)
Total Bilirubin: 1.1 mg/dL (ref 0.3–1.2)
Total Protein: 7.1 g/dL (ref 6.5–8.1)

## 2017-12-30 LAB — MRSA PCR SCREENING: MRSA by PCR: NEGATIVE

## 2017-12-30 LAB — TROPONIN I
TROPONIN I: 0.11 ng/mL — AB (ref <0.03)
TROPONIN I: 0.08 ng/mL — AB (ref <0.03)
Troponin I: 0.11 ng/mL (ref <0.03)

## 2017-12-30 LAB — HEPARIN LEVEL (UNFRACTIONATED): HEPARIN UNFRACTIONATED: 0.47 [IU]/mL (ref 0.30–0.70)

## 2017-12-30 LAB — TSH: TSH: 1.869 u[IU]/mL (ref 0.350–4.500)

## 2017-12-30 LAB — ECHOCARDIOGRAM COMPLETE: Weight: 3600 oz

## 2017-12-30 LAB — BRAIN NATRIURETIC PEPTIDE: B Natriuretic Peptide: >4500 pg/mL — ABNORMAL HIGH (ref 0.0–100.0)

## 2017-12-30 MED ORDER — ACETAMINOPHEN 325 MG PO TABS: 650.0000 mg | ORAL_TABLET | ORAL | Status: DC | PRN

## 2017-12-30 MED ORDER — ONDANSETRON HCL 4 MG/2ML IJ SOLN: 4.0000 mg | Freq: Four times a day (QID) | INTRAMUSCULAR | Status: DC | PRN

## 2017-12-30 MED ORDER — RANOLAZINE ER 500 MG PO TB12
500.0000 mg | ORAL_TABLET | Freq: Two times a day (BID) | ORAL | Status: DC
  Administered 2017-12-30 – 2018-01-03 (×8): 500 mg via ORAL
  Filled 2017-12-30 (×17): qty 1

## 2017-12-30 MED ORDER — SODIUM CHLORIDE 0.9% FLUSH
3.0000 mL | Freq: Two times a day (BID) | INTRAVENOUS | Status: DC
  Administered 2017-12-30 – 2018-01-03 (×9): 3 mL via INTRAVENOUS

## 2017-12-30 MED ORDER — MORPHINE SULFATE (PF) 4 MG/ML IV SOLN
4.0000 mg | Freq: Once | INTRAVENOUS | Status: AC
Start: 2017-12-30 — End: 2017-12-30
  Administered 2017-12-30: 4 mg via INTRAVENOUS
  Filled 2017-12-30: qty 1

## 2017-12-30 MED ORDER — FUROSEMIDE 10 MG/ML IJ SOLN
40.0000 mg | Freq: Once | INTRAMUSCULAR | Status: AC
  Administered 2017-12-30: 40 mg via INTRAVENOUS
  Filled 2017-12-30: qty 4

## 2017-12-30 MED ORDER — POTASSIUM CHLORIDE CRYS ER 20 MEQ PO TBCR
60.0000 meq | EXTENDED_RELEASE_TABLET | Freq: Once | ORAL | Status: AC
  Administered 2017-12-30: 60 meq via ORAL
  Filled 2017-12-30: qty 3

## 2017-12-30 MED ORDER — CARVEDILOL 12.5 MG PO TABS
25.0000 mg | ORAL_TABLET | Freq: Two times a day (BID) | ORAL | Status: DC
  Administered 2017-12-30 – 2018-01-03 (×8): 25 mg via ORAL
  Filled 2017-12-30 (×8): qty 2

## 2017-12-30 MED ORDER — FUROSEMIDE 10 MG/ML IJ SOLN
60.0000 mg | Freq: Two times a day (BID) | INTRAMUSCULAR | Status: DC
  Administered 2017-12-30 – 2018-01-01 (×4): 60 mg via INTRAVENOUS
  Filled 2017-12-30 (×4): qty 6

## 2017-12-30 MED ORDER — MOMETASONE FURO-FORMOTEROL FUM 200-5 MCG/ACT IN AERO
2.0000 | INHALATION_SPRAY | Freq: Two times a day (BID) | RESPIRATORY_TRACT | Status: DC
Start: 2017-12-30 — End: 2018-01-03
  Administered 2018-01-01 – 2018-01-03 (×5): 2 via RESPIRATORY_TRACT
  Filled 2017-12-30 (×2): qty 8.8

## 2017-12-30 MED ORDER — HYDRALAZINE HCL 20 MG/ML IJ SOLN
10.0000 mg | INTRAMUSCULAR | Status: DC | PRN
  Administered 2017-12-30: 10 mg via INTRAVENOUS
  Filled 2017-12-30: qty 1

## 2017-12-30 MED ORDER — HEPARIN (PORCINE) IN NACL 100-0.45 UNIT/ML-% IJ SOLN
1500.0000 [IU]/h | INTRAMUSCULAR | Status: DC
  Administered 2017-12-30 (×2): 1300 [IU]/h via INTRAVENOUS
  Filled 2017-12-30 (×2): qty 250

## 2017-12-30 MED ORDER — ASPIRIN EC 81 MG PO TBEC
81.0000 mg | DELAYED_RELEASE_TABLET | Freq: Every day | ORAL | Status: DC
  Administered 2017-12-31 – 2018-01-03 (×4): 81 mg via ORAL
  Filled 2017-12-30 (×4): qty 1

## 2017-12-30 MED ORDER — MORPHINE SULFATE (PF) 2 MG/ML IV SOLN
1.0000 mg | INTRAVENOUS | Status: DC | PRN
  Administered 2018-01-02 – 2018-01-03 (×5): 1 mg via INTRAVENOUS
  Filled 2017-12-30 (×5): qty 1

## 2017-12-30 MED ORDER — NITROGLYCERIN 2 % TD OINT
1.0000 [in_us] | TOPICAL_OINTMENT | Freq: Once | TRANSDERMAL | Status: AC
  Administered 2017-12-30: 1 [in_us] via TOPICAL
  Filled 2017-12-30: qty 1

## 2017-12-30 MED ORDER — ATORVASTATIN CALCIUM 40 MG PO TABS
80.0000 mg | ORAL_TABLET | Freq: Every day | ORAL | Status: DC
  Administered 2017-12-30 – 2018-01-03 (×5): 80 mg via ORAL
  Filled 2017-12-30 (×5): qty 2

## 2017-12-30 MED ORDER — AMIODARONE HCL IN DEXTROSE 360-4.14 MG/200ML-% IV SOLN
30.0000 mg/h | INTRAVENOUS | Status: DC
  Administered 2017-12-31 – 2018-01-02 (×5): 30 mg/h via INTRAVENOUS
  Filled 2017-12-30 (×4): qty 200

## 2017-12-30 MED ORDER — HEPARIN BOLUS VIA INFUSION
4000.0000 [IU] | Freq: Once | INTRAVENOUS | Status: AC
  Administered 2017-12-30: 4000 [IU] via INTRAVENOUS

## 2017-12-30 MED ORDER — LABETALOL HCL 5 MG/ML IV SOLN
10.0000 mg | INTRAVENOUS | Status: DC | PRN
  Administered 2017-12-30 (×2): 10 mg via INTRAVENOUS
  Filled 2017-12-30 (×2): qty 4

## 2017-12-30 MED ORDER — AMIODARONE LOAD VIA INFUSION
150.0000 mg | Freq: Once | INTRAVENOUS | Status: AC
  Administered 2017-12-30: 150 mg via INTRAVENOUS
  Filled 2017-12-30: qty 83.34

## 2017-12-30 MED ORDER — SODIUM CHLORIDE 0.9% FLUSH: 3.0000 mL | INTRAVENOUS | Status: DC | PRN

## 2017-12-30 MED ORDER — PERFLUTREN LIPID MICROSPHERE
1.0000 mL | INTRAVENOUS | Status: AC | PRN
  Administered 2017-12-30: 2 mL via INTRAVENOUS

## 2017-12-30 MED ORDER — AMIODARONE HCL IN DEXTROSE 360-4.14 MG/200ML-% IV SOLN
60.0000 mg/h | INTRAVENOUS | Status: AC
  Administered 2017-12-31 (×2): 60 mg/h via INTRAVENOUS
  Filled 2017-12-30 (×2): qty 200

## 2017-12-30 MED ORDER — POTASSIUM CHLORIDE CRYS ER 20 MEQ PO TBCR
40.0000 meq | EXTENDED_RELEASE_TABLET | Freq: Two times a day (BID) | ORAL | Status: DC
  Administered 2017-12-30 – 2018-01-01 (×6): 40 meq via ORAL
  Filled 2017-12-30 (×6): qty 2

## 2017-12-30 MED ORDER — SODIUM CHLORIDE 0.9 % IV SOLN: 250.0000 mL | INTRAVENOUS | Status: DC | PRN

## 2017-12-30 MED ORDER — HYDRALAZINE HCL 20 MG/ML IJ SOLN
20.0000 mg | INTRAMUSCULAR | Status: DC | PRN
  Administered 2017-12-30: 20 mg via INTRAVENOUS
  Filled 2017-12-30: qty 1

## 2017-12-30 NOTE — H&P (Signed)
History and Physical  [name] ZOX:[id number] DOB: 12-30-1965 DOA: 12/30/2017  Referring physician: Preston Fleeting, MD PCP: Joette Catching, MD   Chief Complaint:  Chest Pain, SOB  HPI: [name] is a 52 y.o. male with well known and well-documented ischemic cardiomyopathy with a reported ejection fraction between 10-20% and difficult to control hypertension presented to the emergency department with progressive lower extremity edema, chest pain and chest tightness associated with shortness of breath.  The patient has had multiple previous myocardial infarctions.  The patient has long-standing chronic kidney disease.  The patient reports that for the past several weeks he has been noticing increasing edema in the feet and legs.  He continues to take his Lasix and report he has not been missing doses.  He was last hospitalized for congestive heart failure several years ago but reports that he has been fairly stable for the last several years.  He does see cardiology in [place].  He reports that he has a central chest pain that has been nonradiating.  He has some shortness of breath and occasional wheezing.  He has some headache as well.  The patient reports that his symptoms are exacerbated by walking.  He has pain in the legs and feet exacerbated by walking.  He denies abdominal pain at this time.  According to cardiology records he had been referred for ICD placement but the patient ultimately decided against that at the time reportedly.  ED course: The patient was seen in the emergency department and noted to be short of breath with an elevated troponin of 0.10.  He was severely volume overloaded.  He was given a dose of IV Lasix in the ED.  He was put on Nitropaste and started on IV heparin.  His blood pressure was elevated.  His chest x-ray revealed market pulmonary edema.  The patient demonstrated worsening kidney disease.  His clinical exam exhibited pitting edema bilateral lower extremities.   He was treated, placed on supplemental oxygen and admission was requested for further evaluation and management.  Review of Systems: All systems reviewed and apart from history of presenting illness, are negative.  Past Medical History:  Diagnosis Date  . Anemia    a. 2008 in setting of profound epistaxis.  Marland Kitchen CAD (coronary artery disease)    a. NSTEMI 2008: in setting of profound epistaxis/anemia, cath showing severe diffuse RCA dz with L-R collaterals, nonobst dz in left system, managed medically. b. Type II NSTEMI 10/2008. c. Last cath 2012 - for med rx. d. 05/2013: elevated troponin ? due to ARF.  e. Ranexa added 03/2012.  . Cardiomyopathy (HCC)    a. Mixed ICM/NICM - Prior EF 25% back to 2008. b. Improved to 55% in 2012. c. Subsequent echoes: 45% in 11/2010, 15% by echo 05/2013.and 2016  . CHF (congestive heart failure) (HCC)   . Chronic kidney disease    a. Average creatine of about 2.3  . Epistaxis    a. 2008, 2009 a/w significant anemia.  Marland Kitchen History of pneumonia 08/2012, 05/2013  . Hyperlipidemia   . Hypertension    a. Difficult to control. b. Renal duplex neg for RAS 11/2012.  Marland Kitchen SVT (supraventricular tachycardia) (HCC)    a. 05/2013 tx with 6mg  adenosine in ER.   Past Surgical History:  Procedure Laterality Date  . CARDIAC CATHETERIZATION  2012   total RCA occlusion with left-to-right collaterals supplying the distal RCA branches, mild diffuse nonobstructive disease of LAD and LCx. Medically managed   Social History:  reports that he quit smoking about 11 years ago. His smoking use included cigarettes. He has a 10.00 pack-year smoking history. He has never used smokeless tobacco. He reports that he has current or past drug history. He reports that he does not drink alcohol.  No Known Allergies  Family History  Problem Relation Age of Onset  . Hypertension Mother        deceased  . Diabetes Mother        deceased  . Heart disease Mother        deceased  . Heart attack Father          at age 80, deceased    Prior to Admission medications   Medication Sig Start Date End Date Taking? Authorizing Provider  aspirin 81 MG tablet Take 1 tablet (81 mg total) by mouth daily. 11/21/16   Rollene Rotunda, MD  atorvastatin (LIPITOR) 80 MG tablet Take 80 mg by mouth daily. 12/26/16 12/26/17  [provider]  budesonide-formoterol (SYMBICORT) 160-4.5 MCG/ACT inhaler Inhale 2 puffs into the lungs 2 (two) times daily. 07/04/17   [provider]  calcitRIOL (ROCALTROL) 0.25 MCG capsule Take 0.25 mcg by mouth 3 (three) times a week. On Monday, Wednesday, and Friday. 11/24/13   [provider]  carvedilol (COREG) 25 MG tablet Take 2 tablets (50 mg total) by mouth 2 (two) times daily with a meal. 11/01/17   Rollene Rotunda, MD  cholecalciferol (VITAMIN D) 1000 UNITS tablet Take 1,000 Units by mouth daily.    [provider]  Febuxostat (ULORIC) 80 MG TABS Take 1 tablet by mouth daily. 12/17/16   [provider]  hydrALAZINE (APRESOLINE) 100 MG tablet Take 1 tablet (100 mg total) by mouth 3 (three) times daily. 11/21/16   Rollene Rotunda, MD  HYDROcodone-acetaminophen (NORCO/VICODIN) 5-325 MG per tablet Take 1 tablet by mouth every 4 (four) hours as needed for moderate pain. 11/17/13   Azalia Bilis, MD  isosorbide mononitrate (IMDUR) 120 MG 24 hr tablet TAKE 1 TABLET (120 MG TOTAL) BY MOUTH DAILY. 09/30/17   Rollene Rotunda, MD  nitroGLYCERIN (NITROSTAT) 0.4 MG SL tablet Place 1 tablet (0.4 mg total) under the tongue every 5 (five) minutes as needed for chest pain. KEEP OV. 07/11/16   Rollene Rotunda, MD  potassium chloride SA (K-DUR,KLOR-CON) 20 MEQ tablet TAKE ONE TABLET BY MOUTH 2 TIMES A DAY 11/25/17   Rollene Rotunda, MD  ranolazine (RANEXA) 500 MG 12 hr tablet TAKE ONE TABLET BY MOUTH 2 TIMES A DAY **CALL OFFICE TO MAKE APPOINTMENT** 11/01/17   Rollene Rotunda, MD  torsemide (DEMADEX) 20 MG tablet Take 20 mg by mouth 2 (two) times daily.    [provider]   Physical Exam: Vitals:   12/30/17 0742 12/30/17 0800 12/30/17 0830 12/30/17 0900  BP: (!) 168/149 (!) 150/107 (!) 180/116 (!) 168/128  Pulse: 71 69 72 71  Resp: 20 14 14 15   Temp:      TempSrc:      SpO2: 99% 99% 100% 98%  Weight:         General exam: Moderately built and nourished patient, lying comfortably supine on the gurney in no obvious distress.  The patient is markedly volume overloaded and diffusely edematous.  Head, eyes and ENT: Nontraumatic and normocephalic. Pupils equally reacting to light and accommodation. Oral mucosa moist.  Neck: Supple.  Mild JVD, no carotid bruit or thyromegaly.  Lymphatics: No lymphadenopathy.  Respiratory system: Bilateral bibasilar crackles and rhonchi. No increased work of  breathing.  Cardiovascular system: S1 and S2 heard.  Mild JVD, diffuse pitting pedal edema.  Gastrointestinal system: Abdomen is mildly distended, soft and nontender. Normal bowel sounds heard. No organomegaly or masses appreciated.  Central nervous system: Alert and oriented. No focal neurological deficits.  Extremities: Diffuse pitting edema bilateral lower extremities.  Warm extremities noted.  Skin: No rashes or acute findings.  Musculoskeletal system: Diffuse edema.  Psychiatry: Cooperative but flat affect noted.  Labs on Admission:  Basic Metabolic Panel: Recent Labs  Lab 12/30/17 0654  NA 139  K 3.2*  CL 98*  CO2 29  GLUCOSE 194*  BUN 54*  CREATININE 3.78*  CALCIUM 8.4*   Liver Function Tests: Recent Labs  Lab 12/30/17 0654  AST 21  ALT 14*  ALKPHOS 64  BILITOT 1.1  PROT 7.1  ALBUMIN 3.2*   No results for input(s): LIPASE, AMYLASE in the last 168 hours. No results for input(s): AMMONIA in the last 168 hours. CBC: Recent Labs  Lab 12/30/17 0656  WBC 7.6  NEUTROABS 5.1  HGB 11.1*  HCT 36.6*  MCV 84.3  PLT 262   Cardiac Enzymes: No results for input(s): CKTOTAL, CKMB, CKMBINDEX, TROPONINI in the last 168  hours.  BNP (last 3 results) No results for input(s): PROBNP in the last 8760 hours. CBG: No results for input(s): GLUCAP in the last 168 hours.  Radiological Exams on Admission: Dg Chest 2 View  Result Date: 12/30/2017 CLINICAL DATA:  Chest pain, shortness of breath EXAM: CHEST - 2 VIEW COMPARISON:  06/13/2015 FINDINGS: Cardiomegaly with vascular congestion. Mild interstitial prominence, likely interstitial edema. No confluent opacities or effusions. No acute bony abnormality. IMPRESSION: Cardiomegaly with vascular congestion and interstitial prominence, likely edema/CHF. Electronically Signed   By: Charlett Nose M.D.   On: 12/30/2017 07:44   EKG: Personally reviewed.  PVCs but no acute ST-T wave findings.  Assessment/Plan Principal Problem:   Acute systolic (congestive) heart failure (HCC) Active Problems:   Hyperlipemia   ANEMIA   Hypertension   Coronary atherosclerosis   Chest pain   CKD (chronic kidney disease), stage III (HCC)   Shortness of breath   Chronic combined systolic and diastolic CHF (congestive heart failure) (HCC)   SVT (supraventricular tachycardia) (HCC)   Hypertensive heart disease   Acute respiratory failure (HCC)   Hypokalemia   Malignant hypertension   1. Acute systolic congestive heart failure exacerbation- patient is markedly volume overloaded with diffuse edema and will require aggressive consistent diuresis.  The patient is being admitted to the stepdown unit.  Begin IV Lasix twice daily.  Monitor electrolytes.  Monitor daily weights, intake and output.  Continuous telemetry monitoring ordered.  Stat echocardiogram has been ordered to assess systolic function.  Continue beta-blocker.  Holding ARB and ACE due to worsening renal function.  Cardiology consult for further recommendations. 2. Chest pain with elevated troponin-likely some demand ischemia from his worsening congestive heart failure however he does have severe coronary artery disease and will  continue to cycle troponins.  He has been started on a heparin drip.  And Nitropaste has been ordered.  Cardiology consult has been requested.  Morphine ordered for pain.  Continue oxygen and aspirin. 3. Shortness of breath-secondary to acute congestive heart failure and treating as above continue supplemental oxygen. 4. Anemia of chronic kidney disease-we will continue to monitor closely. 5. Acute on chronic kidney disease stage III- hopefully creatinine will improve with diuresis, repeat BMP in the morning and consider nephrology consultation to assist with diuresis  in the setting of worsening kidney disease. 6. Hypertensive heart disease-blood pressure poorly controlled and reported as difficult to control, IV hydralazine ordered as needed, continue Nitropaste, beta-blocker ordered, further recommendations to follow. 7. Hyperlipidemia- continue atorvastatin 80 mg daily. 8. Hypokalemia- oral replacement ordered, check magnesium, follow closely. 9. Cardiomyopathy -according to records the patient had been referred for ICD placement in the past but had declined.   DVT Prophylaxis: The patient is on a heparin infusion Code Status: Full Family Communication: No family present patient updated at bedside on plan of care Disposition Plan: The patient will require intensive medical management in the stepdown unit  Time spent: 63 minutes  Standley Dakins, MD [facility] Pager [phone]  If 7PM-7AM, please contact night-coverage [email or web address] Password [facility] 12/30/2017, 9:19 AM

## 2017-12-30 NOTE — Progress Notes (Signed)
MD paged about patient elevated BP. This nurse has given PRN Hydralazine as well as scheduled Coreg and Lasix.

## 2017-12-30 NOTE — ED Triage Notes (Signed)
Pt brought in via [facility]. Pt states he has chest pain and SOB that started last night around 2000. Pt was given 4 nitro and 324 asprin with minimal relief.

## 2017-12-30 NOTE — Consult Note (Addendum)
Cardiology Consultation:   Patient ID: [name]; [id number]; [birth year]   Admit date: 12/30/2017 Date of Consult: 12/30/2017  Primary Care Provider: Joette Catching, MD Primary Cardiologist: Rollene Rotunda, MD  Primary Electrophysiologist: Dr. Johney Frame   Patient Profile:   [name] is a 52 y.o. male with a hx of ischemic cardiomyopathy ejection fraction 15 to 20% who is being seen today for the evaluation of chest pain and CHF at the request of Dr. Laural Benes.  History of Present Illness:   [name] is a 52 year old male patient of 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  NSTEMI in 2008 in 2010 last cath in 2012 total RCA with left-to-right collaterals, mild diffuse nonobstructive disease in the LAD and circumflex.  Patient also has difficult to control hypertensive heart disease, CKD creatinine averaged 2.3, last saw [name] 08/2017 and was trying to get him to take hydralazine 3 times daily.  Patient says 2 days ago he started developing fluid buildup.  Denies any excessive salt intake and says he was taking his medications regularly but forgot to take them this morning.  Describes the chest pain as a sharp shooting like a knife stabbing him.  No tightness or pressure.  Says his blood pressure has been running high at home at times.  Cannot give me specific numbers.  Denies smoking or alcohol use.  Past Medical History:  Diagnosis Date  . Anemia    a. 2008 in setting of profound epistaxis.  Marland Kitchen CAD (coronary artery disease)    a. NSTEMI 2008: in setting of profound epistaxis/anemia, cath showing severe diffuse RCA dz with L-R collaterals, nonobst dz in left system, managed medically. b. Type II NSTEMI 10/2008. c. Last cath 2012 - for med rx. d. 05/2013: elevated troponin ? due to ARF.  e. Ranexa added 03/2012.  . Cardiomyopathy (HCC)    a. Mixed ICM/NICM - Prior EF 25% back to 2008. b. Improved to 55% in 2012. c.  Subsequent echoes: 45% in 11/2010, 15% by echo 05/2013.and 2016  . CHF (congestive heart failure) (HCC)   . Chronic kidney disease    a. Average creatine of about 2.3  . Epistaxis    a. 2008, 2009 a/w significant anemia.  Marland Kitchen History of pneumonia 08/2012, 05/2013  . Hyperlipidemia   . Hypertension    a. Difficult to control. b. Renal duplex neg for RAS 11/2012.  Marland Kitchen SVT (supraventricular tachycardia) (HCC)    a. 05/2013 tx with 6mg  adenosine in ER.    Past Surgical History:  Procedure Laterality Date  . CARDIAC CATHETERIZATION  2012   total RCA occlusion with left-to-right collaterals supplying the distal RCA branches, mild diffuse nonobstructive disease of LAD and LCx. Medically managed     Home Medications:  Prior to Admission medications   Medication Sig Start Date End Date Taking? Authorizing Provider  metolazone (ZAROXOLYN) 5 MG tablet Take 1 tablet by mouth daily. 12/16/17  Yes [provider]  aspirin 81 MG tablet Take 1 tablet (81 mg total) by mouth daily. 11/21/16   Rollene Rotunda, MD  atorvastatin (LIPITOR) 80 MG tablet Take 80 mg by mouth daily. 12/26/16 12/26/17  [provider]  budesonide-formoterol (SYMBICORT) 160-4.5 MCG/ACT inhaler Inhale 2 puffs into the lungs 2 (two) times daily. 07/04/17   [provider]  calcitRIOL (ROCALTROL) 0.25 MCG capsule Take 0.25 mcg by mouth 3 (three) times a week. On Monday, Wednesday, and Friday. 11/24/13   [provider]  carvedilol (COREG) 25 MG tablet Take 2 tablets (50 mg total) by mouth 2 (two) times daily with a meal. 11/01/17   Rollene Rotunda, MD  cholecalciferol (VITAMIN D) 1000 UNITS tablet Take 1,000 Units by mouth daily.    [provider]  Febuxostat (ULORIC) 80 MG TABS Take 1 tablet by mouth daily. 12/17/16   [provider]  hydrALAZINE (APRESOLINE) 100 MG tablet Take 1 tablet (100 mg total) by mouth 3 (three) times daily. 11/21/16   Rollene Rotunda, MD  HYDROcodone-acetaminophen  (NORCO/VICODIN) 5-325 MG per tablet Take 1 tablet by mouth every 4 (four) hours as needed for moderate pain. 11/17/13   Azalia Bilis, MD  isosorbide mononitrate (IMDUR) 120 MG 24 hr tablet TAKE 1 TABLET (120 MG TOTAL) BY MOUTH DAILY. 09/30/17   Rollene Rotunda, MD  nitroGLYCERIN (NITROSTAT) 0.4 MG SL tablet Place 1 tablet (0.4 mg total) under the tongue every 5 (five) minutes as needed for chest pain. KEEP OV. 07/11/16   Rollene Rotunda, MD  potassium chloride SA (K-DUR,KLOR-CON) 20 MEQ tablet TAKE ONE TABLET BY MOUTH 2 TIMES A DAY 11/25/17   Rollene Rotunda, MD  ranolazine (RANEXA) 500 MG 12 hr tablet TAKE ONE TABLET BY MOUTH 2 TIMES A DAY **CALL OFFICE TO MAKE APPOINTMENT** 11/01/17   Rollene Rotunda, MD  torsemide (DEMADEX) 20 MG tablet Take 20 mg by mouth 2 (two) times daily.    [provider]    Inpatient Medications: Scheduled Meds: . aspirin EC  81 mg Oral Daily  . atorvastatin  80 mg Oral Daily  . carvedilol  50 mg Oral BID WC  . furosemide  60 mg Intravenous Q12H  . mometasone-formoterol  2 puff Inhalation BID  . potassium chloride  40 mEq Oral BID  . ranolazine  500 mg Oral BID  . sodium chloride flush  3 mL Intravenous Q12H   Continuous Infusions: . sodium chloride    . heparin 1,300 Units/hr (12/30/17 0819)   PRN Meds: sodium chloride, acetaminophen, hydrALAZINE, morphine injection, ondansetron (ZOFRAN) IV, perflutren lipid microspheres (DEFINITY) IV suspension, sodium chloride flush  Allergies:   No Known Allergies  Social History:   Social History   Socioeconomic History  . Marital status: Legally Separated    Spouse name: Not on file  . Number of children: 6  . Years of education: Not on file  . Highest education level: Not on file  Occupational History  . Occupation: Unemployed  Social Needs  . Financial resource strain: Not on file  . Food insecurity:    Worry: Not on file    Inability: Not on file  . Transportation needs:    Medical: Not on file     Non-medical: Not on file  Tobacco Use  . Smoking status: Former Smoker    Packs/day: 1.00    Years: 10.00    Pack years: 10.00    Types: Cigarettes    Last attempt to quit: 11/16/2006    Years since quitting: 11.1  . Smokeless tobacco: Never Used  Substance and Sexual Activity  . Alcohol use: No    Alcohol/week: 0.0 oz  . Drug use: Yes    Comment: Previous marijuana use  . Sexual activity: Not on file  Lifestyle  . Physical activity:    Days per week: Not on file    Minutes per session: Not on file  . Stress: Not on file  Relationships  . Social connections:    Talks on phone: Not on file  Gets together: Not on file    Attends religious service: Not on file    Active member of club or organization: Not on file    Attends meetings of clubs or organizations: Not on file    Relationship status: Not on file  . Intimate partner violence:    Fear of current or ex partner: Not on file    Emotionally abused: Not on file    Physically abused: Not on file    Forced sexual activity: Not on file  Other Topics Concern  . Not on file  Social History Narrative   The patient lives in [name] with his sister. He is legally separated and has 6 healthy children who do not live with him. He is not employed and is trying to get disability.    Family History:    Family History  Problem Relation Age of Onset  . Hypertension Mother        deceased  . Diabetes Mother        deceased  . Heart disease Mother        deceased  . Heart attack Father        at age 55, deceased     ROS:  Please see the history of present illness.  Review of Systems  Constitution: Negative.  HENT: Negative.   Cardiovascular: Positive for chest pain, dyspnea on exertion, leg swelling and orthopnea.  Respiratory: Positive for cough, shortness of breath and sleep disturbances due to breathing.   Endocrine: Negative.   Hematologic/Lymphatic: Negative.   Musculoskeletal: Negative.   Gastrointestinal:  Negative.   Genitourinary: Negative.   Neurological: Negative.     All other ROS reviewed and negative.     Physical Exam/Data:   Vitals:   12/30/17 0742 12/30/17 0800 12/30/17 0830 12/30/17 0900  BP: (!) 168/149 (!) 150/107 (!) 180/116 (!) 168/128  Pulse: 71 69 72 71  Resp: 20 14 14 15   Temp:      TempSrc:      SpO2: 99% 99% 100% 98%  Weight:       No intake or output data in the 24 hours ending 12/30/17 0929 Filed Weights   12/30/17 0638  Weight: 225 lb (102.1 kg)   Body mass index is 32.28 kg/m.  General:  Well nourished, well developed, and CHF HEENT: normal Lymph: no adenopathy Neck: Increased JVD Endocrine:  No thryomegaly Vascular: No carotid bruits; FA pulses 2+ bilaterally without bruits  Cardiac:  normal S1, S2; RRR; positive S4 and 1/6 systolic murmur at the left sternal border Lungs: Rales bilaterally halfway up the lungs Abd: soft, nontender, no hepatomegaly  Ext: +2-3 edema bilaterally Musculoskeletal:  No deformities, BUE and BLE strength normal and equal Skin: warm and dry  Neuro:  CNs 2-12 intact, no focal abnormalities noted Psych:  Normal affect   EKG:  The EKG was personally reviewed and demonstrates: Normal sinus rhythm with PVCs, poor R wave progression anteriorly, inferior Q waves, no acute change Telemetry:  Telemetry was personally reviewed and demonstrates: Normal sinus rhythm with PVCs  Relevant CV Studies: Cardiac cath 2012  Laboratory Data:FINAL ASSESSMENT: 1. Total occlusion of the proximal right coronary artery with left-to-     right collaterals supplying the distal right coronary artery     branches. 2. Mild diffuse nonobstructive disease of the left anterior descending     and left circumflex.   RECOMMENDATIONS:  The patient will be treated with ongoing medical therapy for treatment of his coronary artery  disease and hypertension.      2D echo 2016Study Conclusions   - Left ventricle: Severely reduced systolic function, EF  15-20%.   Diffusely hypokinetic. The cavity size was moderately dilated.   Doppler parameters are consistent with abnormal left ventricular   relaxation (grade 1 diastolic dysfunction). Doppler parameters   are consistent with high ventricular filling pressure. - Aortic valve: Mildly calcified annulus. Trileaflet. There was   mild to moderate regurgitation. - Mitral valve: Mildly thickened leaflets . Restricted leaflet   mobility due to left ventricular systolic dysfunction. - Left atrium: The atrium was moderately dilated. - Right ventricle: The cavity size was mildly to moderately   dilated. Systolic function was moderately reduced. - Right atrium: The atrium was mildly dilated. - Tricuspid valve: There was moderate eccentric regurgitation. - Pulmonary arteries: PA peak pressure: 62 mm Hg (S). Severely   elevated pulmonary pressures. - Inferior vena cava: The vessel was dilated. The respirophasic   diameter changes were blunted (< 50%), consistent with elevated   central venous pressure.       Chemistry Recent Labs  Lab 12/30/17 0654  NA 139  K 3.2*  CL 98*  CO2 29  GLUCOSE 194*  BUN 54*  CREATININE 3.78*  CALCIUM 8.4*  GFRNONAA 17*  GFRAA 20*  ANIONGAP 12    Recent Labs  Lab 12/30/17 0654  PROT 7.1  ALBUMIN 3.2*  AST 21  ALT 14*  ALKPHOS 64  BILITOT 1.1   Hematology Recent Labs  Lab 12/30/17 0656  WBC 7.6  RBC 4.34  HGB 11.1*  HCT 36.6*  MCV 84.3  MCH 25.6*  MCHC 30.3  RDW 16.6*  PLT 262   Cardiac EnzymesNo results for input(s): TROPONINI in the last 168 hours.  Recent Labs  Lab 12/30/17 0657  TROPIPOC 0.10*    BNP Recent Labs  Lab 12/30/17 0654  BNP >4,500.0*    DDimer No results for input(s): DDIMER in the last 168 hours.  Radiology/Studies:  Dg Chest 2 View  Result Date: 12/30/2017 CLINICAL DATA:  Chest pain, shortness of breath EXAM: CHEST - 2 VIEW COMPARISON:  06/13/2015 FINDINGS: Cardiomegaly with vascular congestion. Mild  interstitial prominence, likely interstitial edema. No confluent opacities or effusions. No acute bony abnormality. IMPRESSION: Cardiomegaly with vascular congestion and interstitial prominence, likely edema/CHF. Electronically Signed   By: Charlett Nose M.D.   On: 12/30/2017 07:44    Assessment and Plan:   1. Acute on chronic combined systolic and diastolic CHF last echo 2016 LVEF 15 to 20% with grade 1 DD.  Has refused ICD.  BNP greater than 4500 and chest x-ray consistent with CHF-echo being done now.  Received Lasix 40 mg IV in the emergency room and placed on 60 mg IV twice daily-takes Demadex 20 mg twice daily at home 2. Chest pain troponin mildly elevated 0.10 no relief from nitroglycerin but easing with morphine-chest pain atypical and troponins could be elevated because of CKD and CHF.  Continue to cycle troponins.  On Imdur and Ranexa as an outpatient 3. CAD status post NSTEMI 2008 in setting of profound anemia cath with severe diffuse RCA disease with left to right collaterals nonobstructive disease elsewhere.  NSTEMI 2010, last cath 2012 medical therapy elevated troponin felt secondary to ARF. 4. Hypertensive heart disease with elevated blood pressure on admission has been difficult to control has an outpatient.  Renal duplex negative for RAS in 2014.  BP 160/149-BP is slowly coming down with IV Lasix and resuming his medications.  5. CKD creatinine averaged 2.3 now 3.78 6. Hypokalemia-replacement given 7. Hyperlipidemia on Lipitor 80 mg daily   For questions or updates, please contact [facility] Please consult [email or web address] for contact info under Cardiology/STEMI.   Signed, Jacolyn Reedy, PA-C  12/30/2017 9:29 AM   I have personally reviewed all documentation, labs, radiographic and cardiovascular studies, and independently interpreted all ECG's.  Briefly, this is a 52 year old male who is normally followed by [name] and was last seen in the cardiology office on 08/07/2017.   He has a history of coronary artery disease and cardiomyopathy with recalcitrant hypertension.  I reviewed his most recent echocardiogram dated 01/26/2015 which demonstrated severely reduced left ventricular systolic function, LVEF 15 to 41%, diffuse hypokinesis, moderate left ventricular dilatation, grade 1 diastolic dysfunction with elevated ventricular filling pressures, mild to moderate aortic and moderate tricuspid regurgitation with right ventricular dilatation and systolic dysfunction and severely elevated pulmonary pressures, 62 mmHg.  He said he has been taking his medications regularly including hydralazine 3 times daily.  About 2 nights ago he began experiencing bilateral feet and leg swelling and progressive exertional dyspnea.  He normally sleeps on the bed but slept in a chair and propped himself up with 2 pillows.  He had some paroxysmal nocturnal dyspnea that evening.  He has had some intermittent retrosternal chest pains not associated with exertion.  He was found to be in acute on chronic combined systolic and diastolic heart failure in the ED at [facility].  Point-of-care troponin was mildly elevated at 0.1 and BNP was elevated at greater than 4500.  He has at least stage IV chronic kidney disease and creatinine was 3.78 today.  He follows with nephrology in [place].  Chest x-ray consistent with CHF.  I personally reviewed the ECG which demonstrated sinus rhythm with frequent PVCs, old inferior and anterolateral infarct, and nonspecific IVCD.  He has received nitroglycerin paste and 1 dose of IV Lasix 40 mg in the ED and he said his symptoms have improved to some degree.    He has been started on IV Lasix 60 mg twice daily.  He denies chest pain at the present time and we will cycle serum troponins given his known coronary artery disease but the mild elevation may be due to demand ischemia in the context of decompensated CHF and chronic kidney disease stage IV.  A  follow-up echocardiogram has been ordered by internal medicine which I will review.  Blood pressure will need more optimal control but is gradually decreasing.  This will be monitored in the context of need for IV diuresis.  He is hypokalemic and this is being replaced. Ranexa ordered but Imdur on hold as he is on nitro paste. He is on ASA, statin, and Coreg. I would continue hydralazine.  I am unable to add ACE inhibitors, angiotensin receptor blockers, or angiotensin receptor-neprilysin inhibitors due to advanced chronic kidney disease.

## 2017-12-30 NOTE — ED Provider Notes (Signed)
[facility] EMERGENCY DEPARTMENT Provider Note   CSN: [id number] Arrival date & time: 12/30/17  1103     History   Chief Complaint Chief Complaint  Patient presents with  . Chest Pain    HPI [name] is a 52 y.o. male.  The history is provided by the patient.  Chest Pain    He has a history of mixed ischemic/nonischemic cardiomyopathy, coronary artery disease, hypertension, hyperlipidemia, stage III chronic kidney disease, combined systolic diastolic heart failure and comes into the ED with onset last night about 7 PM of chest pain and pedal edema.  He describes sharp pain in the midsternal area with some radiation to the left side of the chest.  Pain is worse with exertion, better with belching.  There is associated dyspnea, nausea, diaphoresis.  He has not treated it with anything at home.  He was brought in by ambulance where he was given nitroglycerin and aspirin without any significant relief.  He denies any dietary indiscretions.  He adamantly denies any edema being present before last night.  Past Medical History:  Diagnosis Date  . Anemia    a. 2008 in setting of profound epistaxis.  Marland Kitchen CAD (coronary artery disease)    a. NSTEMI 2008: in setting of profound epistaxis/anemia, cath showing severe diffuse RCA dz with L-R collaterals, nonobst dz in left system, managed medically. b. Type II NSTEMI 10/2008. c. Last cath 2012 - for med rx. d. 05/2013: elevated troponin ? due to ARF.  e. Ranexa added 03/2012.  . Cardiomyopathy (HCC)    a. Mixed ICM/NICM - Prior EF 25% back to 2008. b. Improved to 55% in 2012. c. Subsequent echoes: 45% in 11/2010, 15% by echo 05/2013.and 2016  . CHF (congestive heart failure) (HCC)   . Chronic kidney disease    a. Average creatine of about 2.3  . Epistaxis    a. 2008, 2009 a/w significant anemia.  Marland Kitchen History of pneumonia 08/2012, 05/2013  . Hyperlipidemia   . Hypertension    a. Difficult to control. b. Renal duplex neg for RAS 11/2012.  Marland Kitchen SVT  (supraventricular tachycardia) (HCC)    a. 05/2013 tx with 6mg  adenosine in ER.    Patient Active Problem List   Diagnosis Date Noted  . CAD (coronary artery disease) 12/15/2013  . Heart failure (HCC) 12/14/2013  . Acute on chronic systolic CHF (congestive heart failure), NYHA class 3 (HCC) 08/24/2013  . Hypertensive heart disease 07/24/2013  . Acute respiratory failure (HCC) 07/24/2013  . Hypertensive urgency 07/24/2013  . Chronic combined systolic and diastolic CHF (congestive heart failure) (HCC) 05/11/2013  . SVT (supraventricular tachycardia) (HCC) 05/11/2013  . Shortness of breath 08/28/2012  . Sinus bradycardia 03/03/2012  . CKD (chronic kidney disease), stage III (HCC) 03/03/2012  . CAP (community acquired pneumonia) 11/16/2011  . Chest pain 11/16/2011  . HYPERLIPIDEMIA 11/22/2008  . ANEMIA 11/22/2008  . HYPERTENSION 11/22/2008  . CAD 11/22/2008    Past Surgical History:  Procedure Laterality Date  . CARDIAC CATHETERIZATION  2012   total RCA occlusion with left-to-right collaterals supplying the distal RCA branches, mild diffuse nonobstructive disease of LAD and LCx. Medically managed        Home Medications    Prior to Admission medications   Medication Sig Start Date End Date Taking? Authorizing Provider  aspirin 81 MG tablet Take 1 tablet (81 mg total) by mouth daily. 11/21/16   Rollene Rotunda, MD  atorvastatin (LIPITOR) 80 MG tablet Take 80 mg by mouth daily.  12/26/16 12/26/17  [provider]  budesonide-formoterol (SYMBICORT) 160-4.5 MCG/ACT inhaler Inhale 2 puffs into the lungs 2 (two) times daily. 07/04/17   [provider]  calcitRIOL (ROCALTROL) 0.25 MCG capsule Take 0.25 mcg by mouth 3 (three) times a week. On Monday, Wednesday, and Friday. 11/24/13   [provider]  carvedilol (COREG) 25 MG tablet Take 2 tablets (50 mg total) by mouth 2 (two) times daily with a meal. 11/01/17   Rollene Rotunda, MD  cholecalciferol (VITAMIN D) 1000  UNITS tablet Take 1,000 Units by mouth daily.    [provider]  Febuxostat (ULORIC) 80 MG TABS Take 1 tablet by mouth daily. 12/17/16   [provider]  hydrALAZINE (APRESOLINE) 100 MG tablet Take 1 tablet (100 mg total) by mouth 3 (three) times daily. 11/21/16   Rollene Rotunda, MD  HYDROcodone-acetaminophen (NORCO/VICODIN) 5-325 MG per tablet Take 1 tablet by mouth every 4 (four) hours as needed for moderate pain. 11/17/13   Azalia Bilis, MD  isosorbide mononitrate (IMDUR) 120 MG 24 hr tablet TAKE 1 TABLET (120 MG TOTAL) BY MOUTH DAILY. 09/30/17   Rollene Rotunda, MD  nitroGLYCERIN (NITROSTAT) 0.4 MG SL tablet Place 1 tablet (0.4 mg total) under the tongue every 5 (five) minutes as needed for chest pain. KEEP OV. 07/11/16   Rollene Rotunda, MD  potassium chloride SA (K-DUR,KLOR-CON) 20 MEQ tablet TAKE ONE TABLET BY MOUTH 2 TIMES A DAY 11/25/17   Rollene Rotunda, MD  ranolazine (RANEXA) 500 MG 12 hr tablet TAKE ONE TABLET BY MOUTH 2 TIMES A DAY **CALL OFFICE TO MAKE APPOINTMENT** 11/01/17   Rollene Rotunda, MD  torsemide (DEMADEX) 20 MG tablet Take 20 mg by mouth 2 (two) times daily.    [provider]    Family History Family History  Problem Relation Age of Onset  . Hypertension Mother        deceased  . Diabetes Mother        deceased  . Heart disease Mother        deceased  . Heart attack Father        at age 26, deceased    Social History Social History   Tobacco Use  . Smoking status: Former Smoker    Packs/day: 1.00    Years: 10.00    Pack years: 10.00    Types: Cigarettes    Last attempt to quit: 11/16/2006    Years since quitting: 11.1  . Smokeless tobacco: Never Used  Substance Use Topics  . Alcohol use: No    Alcohol/week: 0.0 oz  . Drug use: Yes    Comment: Previous marijuana use     Allergies   Patient has no known allergies.   Review of Systems Review of Systems  Cardiovascular: Positive for chest pain.  All other systems reviewed  and are negative.    Physical Exam Updated Vital Signs BP (!) 156/111 (BP Location: Right Arm)   Pulse 76   Temp 98.1 F (36.7 C) (Oral)   Resp (!) 21   Wt 102.1 kg (225 lb)   SpO2 99%   BMI 32.28 kg/m   Physical Exam  Nursing note and vitals reviewed.  52 year old male, resting comfortably and in no acute distress. Vital signs are significant for hypertension. Oxygen saturation is 99%, which is normal. Head is normocephalic and atraumatic. PERRLA, EOMI. Oropharynx is clear. Neck is nontender and supple without adenopathy. JVD is present at 90 degrees. Back is nontender and there is no  CVA tenderness. Lungs are clear without rales, wheezes, or rhonchi. Chest is nontender. Heart has regular rate and rhythm without murmur. Abdomen is soft, flat, nontender without masses or hepatosplenomegaly and peristalsis is normoactive. Extremities have 3+ edema, full range of motion is present. Skin is warm and dry without rash. Neurologic: Mental status is normal, cranial nerves are intact, there are no motor or sensory deficits.  ED Treatments / Results  Labs (all labs ordered are listed, but only abnormal results are displayed) Labs Reviewed  COMPREHENSIVE METABOLIC PANEL - Abnormal; Notable for the following components:      Result Value   Potassium 3.2 (*)    Chloride 98 (*)    Glucose, Bld 194 (*)    BUN 54 (*)    Creatinine, Ser 3.78 (*)    Calcium 8.4 (*)    Albumin 3.2 (*)    ALT 14 (*)    GFR calc non Af Amer 17 (*)    GFR calc Af Amer 20 (*)    All other components within normal limits  CBC WITH DIFFERENTIAL/PLATELET - Abnormal; Notable for the following components:   Hemoglobin 11.1 (*)    HCT 36.6 (*)    MCH 25.6 (*)    RDW 16.6 (*)    All other components within normal limits  BRAIN NATRIURETIC PEPTIDE - Abnormal; Notable for the following components:   B Natriuretic Peptide >4,500.0 (*)    All other components within normal limits  I-STAT TROPONIN, ED -  Abnormal; Notable for the following components:   Troponin i, poc 0.10 (*)    All other components within normal limits  HEPARIN LEVEL (UNFRACTIONATED)    EKG EKG Interpretation  Date/Time:  Monday December 30 2017 06:41:28 EDT Ventricular Rate:  80 PR Interval:    QRS Duration: 143 QT Interval:  452 QTC Calculation: 522 R Axis:   -85 Text Interpretation:  Sinus rhythm Multiple premature complexes, vent & supraven Left atrial enlargement Nonspecific IVCD with LAD Inferior infarct, old Anterolateral infarct, age indeterminate Low voltage QRS When compared with ECG of 01/04/2015, No significant change was found Confirmed by Dione Booze ([id number]) on 12/30/2017 6:46:37 AM   Radiology Dg Chest 2 View  Result Date: 12/30/2017 CLINICAL DATA:  Chest pain, shortness of breath EXAM: CHEST - 2 VIEW COMPARISON:  06/13/2015 FINDINGS: Cardiomegaly with vascular congestion. Mild interstitial prominence, likely interstitial edema. No confluent opacities or effusions. No acute bony abnormality. IMPRESSION: Cardiomegaly with vascular congestion and interstitial prominence, likely edema/CHF. Electronically Signed   By: Charlett Nose M.D.   On: 12/30/2017 07:44    Procedures Procedures  CRITICAL CARE Performed by: Dione Booze Total critical care time: 90 minutes Critical care time was exclusive of separately billable procedures and treating other patients. Critical care was necessary to treat or prevent imminent or life-threatening deterioration. Critical care was time spent personally by me on the following activities: development of treatment plan with patient and/or surrogate as well as nursing, discussions with consultants, evaluation of patient's response to treatment, examination of patient, obtaining history from patient or surrogate, ordering and performing treatments and interventions, ordering and review of laboratory studies, ordering and review of radiographic studies, pulse oximetry and  re-evaluation of patient's condition.  Medications Ordered in ED Medications  heparin ADULT infusion 100 units/mL (25000 units/240mL sodium chloride 0.45%) (1,300 Units/hr Intravenous New Bag/Given 12/30/17 0819)  potassium chloride SA (K-DUR,KLOR-CON) CR tablet 60 mEq (has no administration in time range)  morphine 4 MG/ML injection 4 mg (4  mg Intravenous Given 12/30/17 0717)  furosemide (LASIX) injection 40 mg (40 mg Intravenous Given 12/30/17 0819)  nitroGLYCERIN (NITROGLYN) 2 % ointment 1 inch (1 inch Topical Given 12/30/17 0819)  heparin bolus via infusion 4,000 Units (4,000 Units Intravenous Bolus from Bag 12/30/17 0819)     Initial Impression / Assessment and Plan / ED Course  I have reviewed the triage vital signs and the nursing notes.  Pertinent labs & imaging results that were available during my care of the patient were reviewed by me and considered in my medical decision making (see chart for details).  Chest pain and dyspnea and patient with known history of coronary disease and CHF.  He has obvious physical findings of CHF.  With failure to respond to nitroglycerin, he will be given morphine.  ECG shows no acute changes.  Screening labs are obtained.  Old records are reviewed confirming history of cardiomyopathy and combined systolic and diastolic heart failure.  He has had hospitalizations for heart failure and chest pain, but not recently.  ED visit in 2016 with elevated troponin where he refused hospital admission.  He had partial relief of pain with morphine and is given additional morphine.  Chest x-ray is consistent with CHF.  Troponin has come back mildly elevated at 0.10 and will need to be trended.  BNP is markedly elevated at greater than 4500, consistent with heart failure.  Potassium is slightly low at 3.2 and he is given a dose of oral potassium.  Creatinine is 3.78 which is significantly higher than last creatinine on record (2.87 on Jan 13, 2015).  Moderate anemia is  present with hemoglobin 11.1, which is down from 12.3 on Jan 04, 2015.  On review of care everywhere, he was noted to have hemoglobin of 11.8 and creatinine 3.02 on November 07, 2017.  Creatinine is noted to have been as high as 3.9 on Feb 01, 2015.  Because of elevated troponin, he is started on heparin.  He is given furosemide intravenously, and is started on topical nitroglycerin.  Case is discussed with Dr. Laural Benes of [facility], who agrees to admit the patient, but requests cardiology input as to whether patient needs to be transferred urgently to [facility].  I have discussed the case with Dr. Purvis Sheffield of cardiology service.  He has reviewed the patient's records and feels that he can be safely managed here.  Arrangements are made for hospital admission.  Final Clinical Impressions(s) / ED Diagnoses   Final diagnoses:  Acute on chronic combined systolic and diastolic heart failure (HCC)  Acute renal failure superimposed on stage 4 chronic kidney disease, unspecified acute renal failure type (HCC)  Chest pain, unspecified type  Elevated troponin I level  Diuretic-induced hypokalemia  Anemia associated with chronic renal failure    ED Discharge Orders    None       Dione Booze, MD 12/30/17 [phone]

## 2017-12-30 NOTE — Progress Notes (Signed)
Patient had 32 beat run of vtach. Went into room and patient was alert and said it "felt like his heart stopped". Patient went back into sinus rhythm. 12 lead obtained and Dr. Sherryll Burger paged who came to see patient. New orders given. Will continue to monitor.

## 2017-12-30 NOTE — ED Notes (Addendum)
Critical result. Troponin 0.10. EDP notified. Heparin ordered.

## 2017-12-30 NOTE — Progress Notes (Signed)
ANTICOAGULATION CONSULT NOTE - Initial Consult  Pharmacy Consult for heparin Indication: ACS/STEMI  No Known Allergies  Patient Measurements: Weight: 225 lb (102.1 kg) Heparin Dosing Weight: 94.5 kg  Vital Signs: Temp: 98.1 F (36.7 C) (04/29 0642) Temp Source: Oral (04/29 0642) BP: 168/149 (04/29 0742) Pulse Rate: 71 (04/29 0742)  Labs: Recent Labs    12/30/17 0654 12/30/17 0656  HGB  --  11.1*  HCT  --  36.6*  PLT  --  262  CREATININE 3.78*  --     Estimated Creatinine Clearance: 28 mL/min (A) (by C-G formula based on SCr of 3.78 mg/dL (H)).   Medical History: Past Medical History:  Diagnosis Date  . Anemia    a. 2008 in setting of profound epistaxis.  Marland Kitchen CAD (coronary artery disease)    a. NSTEMI 2008: in setting of profound epistaxis/anemia, cath showing severe diffuse RCA dz with L-R collaterals, nonobst dz in left system, managed medically. b. Type II NSTEMI 10/2008. c. Last cath 2012 - for med rx. d. 05/2013: elevated troponin ? due to ARF.  e. Ranexa added 03/2012.  . Cardiomyopathy (HCC)    a. Mixed ICM/NICM - Prior EF 25% back to 2008. b. Improved to 55% in 2012. c. Subsequent echoes: 45% in 11/2010, 15% by echo 05/2013.and 2016  . CHF (congestive heart failure) (HCC)   . Chronic kidney disease    a. Average creatine of about 2.3  . Epistaxis    a. 2008, 2009 a/w significant anemia.  Marland Kitchen History of pneumonia 08/2012, 05/2013  . Hyperlipidemia   . Hypertension    a. Difficult to control. b. Renal duplex neg for RAS 11/2012.  Marland Kitchen SVT (supraventricular tachycardia) (HCC)    a. 05/2013 tx with 6mg  adenosine in ER.    Assessment: Pharmacy consulted to dose heparin in patient for patient with ACS/STEMI. Patient has elevated troponin of 0.10.  Goal of Therapy:  Heparin level 0.3-0.7 units/ml Monitor platelets by anticoagulation protocol: Yes   Plan:  Give 4000 units bolus x 1 Start heparin infusion at 1300 units/hr Check anti-Xa level in 6 hours and daily while  on heparin Continue to monitor H&H and platelets  Judeth Cornfield, PharmD Clinical Pharmacist 12/30/2017 7:57 AM

## 2017-12-30 NOTE — Progress Notes (Signed)
*  PRELIMINARY RESULTS* Echocardiogram 2D Echocardiogram with definity has been performed.  [name] 12/30/2017, 10:42 AM

## 2017-12-30 NOTE — Progress Notes (Signed)
Paged about patient having a 32 beat run of V. tach with sensation of heartbeat stopping per patient.  Noted severe structural heart disease with EF 15 to 20% and no AICD.  Twelve-lead EKG obtained and reviewed with comparison to prior on 4/29 which appears generally about the same.  Patient denies any current chest pain, palpitations, or dyspnea.  Will start on amiodarone bolus followed by infusion.  Cardiology following with further recommendations appreciated in a.m.

## 2017-12-30 NOTE — ED Notes (Signed)
CRITICAL VALUE ALERT  Critical Value: troponin 0.11  Date & Time Notied:  12/30/17 1035  Provider Notified: dr Laural Benes via Amion  Orders Received/Actions taken: none

## 2017-12-30 NOTE — Progress Notes (Signed)
ANTICOAGULATION CONSULT NOTE - Initial Consult  Pharmacy Consult for heparin Indication: ACS/STEMI  No Known Allergies  Patient Measurements: Height: 5\' 10"  (177.8 cm) Weight: 216 lb 0.8 oz (98 kg) IBW/kg (Calculated) : 73 Heparin Dosing Weight: 94.5 kg  Vital Signs: Temp: 98.4 F (36.9 C) (04/29 1500) Temp Source: Oral (04/29 1500) BP: 166/127 (04/29 1630) Pulse Rate: 81 (04/29 1630)  Labs: Recent Labs    12/30/17 0654 12/30/17 0656 12/30/17 0959 12/30/17 1417 12/30/17 1427  HGB  --  11.1*  --   --   --   HCT  --  36.6*  --   --   --   PLT  --  262  --   --   --   HEPARINUNFRC  --   --   --  0.47  --   CREATININE 3.78*  --   --   --   --   TROPONINI  --   --  0.11*  --  0.11*    Estimated Creatinine Clearance: 27.4 mL/min (A) (by C-G formula based on SCr of 3.78 mg/dL (H)).   Medical History: Past Medical History:  Diagnosis Date  . Anemia    a. 2008 in setting of profound epistaxis.  Marland Kitchen CAD (coronary artery disease)    a. NSTEMI 2008: in setting of profound epistaxis/anemia, cath showing severe diffuse RCA dz with L-R collaterals, nonobst dz in left system, managed medically. b. Type II NSTEMI 10/2008. c. Last cath 2012 - for med rx. d. 05/2013: elevated troponin ? due to ARF.  e. Ranexa added 03/2012.  . Cardiomyopathy (HCC)    a. Mixed ICM/NICM - Prior EF 25% back to 2008. b. Improved to 55% in 2012. c. Subsequent echoes: 45% in 11/2010, 15% by echo 05/2013.and 2016  . CHF (congestive heart failure) (HCC)   . Chronic kidney disease    a. Average creatine of about 2.3  . Epistaxis    a. 2008, 2009 a/w significant anemia.  Marland Kitchen History of pneumonia 08/2012, 05/2013  . Hyperlipidemia   . Hypertension    a. Difficult to control. b. Renal duplex neg for RAS 11/2012.  Marland Kitchen SVT (supraventricular tachycardia) (HCC)    a. 05/2013 tx with 6mg  adenosine in ER.    Assessment: Pharmacy consulted to dose heparin in patient for patient with ACS/STEMI. Patient has elevated troponin  of 0.10. Heparin level therapeutic at 0.47.  Goal of Therapy:  Heparin level 0.3-0.7 units/ml Monitor platelets by anticoagulation protocol: Yes   Plan:  Continue heparin infusion at 1300 units/hr Check anti-Xa level daily while on heparin Continue to monitor H&H and platelets   Judeth Cornfield, PharmD Clinical Pharmacist 12/30/2017 4:50 PM

## 2017-12-31 ENCOUNTER — Inpatient Hospital Stay (HOSPITAL_COMMUNITY): Payer: Medicare Other

## 2017-12-31 DIAGNOSIS — I248 Other forms of acute ischemic heart disease: Secondary | ICD-10-CM

## 2017-12-31 DIAGNOSIS — I472 Ventricular tachycardia: Secondary | ICD-10-CM

## 2017-12-31 DIAGNOSIS — R079 Chest pain, unspecified: Secondary | ICD-10-CM

## 2017-12-31 DIAGNOSIS — E785 Hyperlipidemia, unspecified: Secondary | ICD-10-CM

## 2017-12-31 LAB — CBC WITH DIFFERENTIAL/PLATELET
BASOS ABS: 0 10*3/uL (ref 0.0–0.1)
BASOS PCT: 1 %
EOS ABS: 0.2 10*3/uL (ref 0.0–0.7)
EOS PCT: 2 %
HCT: 39.4 % (ref 39.0–52.0)
Hemoglobin: 11.7 g/dL — ABNORMAL LOW (ref 13.0–17.0)
Lymphocytes Relative: 16 %
Lymphs Abs: 1.4 10*3/uL (ref 0.7–4.0)
MCH: 25.2 pg — ABNORMAL LOW (ref 26.0–34.0)
MCHC: 29.7 g/dL — ABNORMAL LOW (ref 30.0–36.0)
MCV: 84.9 fL (ref 78.0–100.0)
Monocytes Absolute: 0.6 10*3/uL (ref 0.1–1.0)
Monocytes Relative: 7 %
Neutro Abs: 6.6 10*3/uL (ref 1.7–7.7)
Neutrophils Relative %: 74 %
PLATELETS: 272 10*3/uL (ref 150–400)
RBC: 4.64 MIL/uL (ref 4.22–5.81)
RDW: 16.7 % — AB (ref 11.5–15.5)
WBC: 8.8 10*3/uL (ref 4.0–10.5)

## 2017-12-31 LAB — BASIC METABOLIC PANEL
ANION GAP: 13 (ref 5–15)
BUN: 43 mg/dL — AB (ref 6–20)
CALCIUM: 8.6 mg/dL — AB (ref 8.9–10.3)
CO2: 27 mmol/L (ref 22–32)
Chloride: 99 mmol/L — ABNORMAL LOW (ref 101–111)
Creatinine, Ser: 2.85 mg/dL — ABNORMAL HIGH (ref 0.61–1.24)
GFR calc Af Amer: 28 mL/min — ABNORMAL LOW (ref >60)
GFR, EST NON AFRICAN AMERICAN: 24 mL/min — AB (ref >60)
Glucose, Bld: 158 mg/dL — ABNORMAL HIGH (ref 65–99)
Potassium: 4.3 mmol/L (ref 3.5–5.1)
SODIUM: 139 mmol/L (ref 135–145)

## 2017-12-31 LAB — HEPARIN LEVEL (UNFRACTIONATED): HEPARIN UNFRACTIONATED: 0.24 [IU]/mL — AB (ref 0.30–0.70)

## 2017-12-31 LAB — BRAIN NATRIURETIC PEPTIDE

## 2017-12-31 LAB — MAGNESIUM: MAGNESIUM: 1.4 mg/dL — AB (ref 1.7–2.4)

## 2017-12-31 MED ORDER — MAGNESIUM SULFATE 4 GM/100ML IV SOLN
4.0000 g | Freq: Once | INTRAVENOUS | Status: AC
  Administered 2017-12-31: 4 g via INTRAVENOUS
  Filled 2017-12-31: qty 100

## 2017-12-31 MED ORDER — ISOSORBIDE MONONITRATE ER 60 MG PO TB24
120.0000 mg | ORAL_TABLET | Freq: Every day | ORAL | Status: DC
  Administered 2017-12-31 – 2018-01-03 (×4): 120 mg via ORAL
  Filled 2017-12-31 (×4): qty 2

## 2017-12-31 NOTE — Progress Notes (Addendum)
PROGRESS NOTE  [name]  ZOX:[id number]  DOB: Sep 20, 1965  DOA: 12/30/2017 PCP: Joette Catching, MD   Brief Admission Hx: [name] is a 52 y.o. male with well known and well-documented ischemic cardiomyopathy with a reported ejection fraction between 10-20% and difficult to control hypertension presented to the emergency department with progressive lower extremity edema, chest pain and chest tightness associated with shortness of breath.   MDM/Assessment & Plan:   1. Acute systolic congestive heart failure exacerbation- patient is markedly volume overloaded with diffuse edema and will require aggressive consistent diuresis.  The patient is being admitted to the stepdown unit.  Begin IV Lasix twice daily.  Monitor electrolytes.  Monitor daily weights, intake and output.  Continuous telemetry monitoring ordered.  Stat echocardiogram has been ordered to assess systolic function.  Continue beta-blocker.  Holding ARB and ACE due to worsening renal function.  Cardiology consult for further recommendations.  Pt has diuresed nearly 3 liters overnight.  2. Recurrent V tachycardia - Pt was symptomatic with a run of 32 beats, spontaneously converted to sinus.  He was given IV bolus of amiodarone, further recommendations to follow from cardiology service.  Replacing magnesium, recheck in AM.   3. Hypomagnesemia - IV replacement ordered, recheck in AM.   4. Chest pain with elevated troponin-likely some demand ischemia from his worsening congestive heart failure however he does have severe coronary artery disease and will continue to cycle troponins.  He has been started on a heparin drip.  And Nitropaste has been ordered.  Cardiology consult has been requested.  Morphine ordered for pain.  Continue oxygen and aspirin. 5. Shortness of breath-secondary to acute congestive heart failure and treating as above continue supplemental oxygen. 6. Anemia of chronic kidney disease-we will continue to monitor  closely. 7. Acute on chronic kidney disease stage III- hopefully creatinine will improve with diuresis, repeat BMP in the morning and consider nephrology consultation to assist with diuresis in the setting of worsening kidney disease. 8. Hypertensive heart disease-blood pressure poorly controlled and reported as difficult to control, IV hydralazine ordered as needed, continue Nitropaste, beta-blocker ordered, further recommendations to follow. 9. Hyperlipidemia- continue atorvastatin 80 mg daily. 10. Hypokalemia- oral replacement ordered, check magnesium, follow closely. 11. Cardiomyopathy -according to records the patient had been referred for ICD placement in the past but had declined.   DVT Prophylaxis: The patient is on a heparin infusion Code Status: Full Family Communication: No family present patient updated at bedside on plan of care Disposition Plan: The patient will require intensive medical management in the stepdown unit   Consultants:  cardiology Currently on heparin and amiodarone infusions  Subjective: Pt had symptomatic run of v tach overnight with run of 32 beats, bolused with amiodarone.  He is feeling better now.  It converted spontaneously.    Objective: Vitals:   12/31/17 0400 12/31/17 0430 12/31/17 0432 12/31/17 0500  BP:   (!) 133/98 (!) 150/97  Pulse: 61 61 66   Resp: 19 13 (!) 25 14  Temp: 98.6 F (37 C)     TempSrc: Oral     SpO2: 100% 100% 100% 100%  Weight:    97.7 kg (215 lb 6.2 oz)  Height:        Intake/Output Summary (Last 24 hours) at 12/31/2017 0619 Last data filed at 12/31/2017 0500 Gross per 24 hour  Intake 1205.2 ml  Output 4150 ml  Net -2944.8 ml   Filed Weights   12/30/17 0638 12/30/17 1500 12/31/17 0500  Weight:  102.1 kg (225 lb) 98 kg (216 lb 0.8 oz) 97.7 kg (215 lb 6.2 oz)     REVIEW OF SYSTEMS  As per history otherwise all reviewed and reported negative  Exam:  General exam: chronically ill appearing male, appears older  than stated age, no current distress.  Respiratory system: crackles present at both bases. No increased work of breathing. Cardiovascular system: normal S1 & S2 heard.  2+ pitting edema BLEs Gastrointestinal system: Abdomen is nondistended, soft and nontender. Normal bowel sounds heard. Central nervous system: Alert and oriented. No focal neurological deficits. Extremities: 2+ pitting edema BLE.  Data Reviewed: Basic Metabolic Panel: Recent Labs  Lab 12/30/17 0654 12/31/17 0433  NA 139 139  K 3.2* 4.3  CL 98* 99*  CO2 29 27  GLUCOSE 194* 158*  BUN 54* 43*  CREATININE 3.78* 2.85*  CALCIUM 8.4* 8.6*  MG  --  1.4*   Liver Function Tests: Recent Labs  Lab 12/30/17 0654  AST 21  ALT 14*  ALKPHOS 64  BILITOT 1.1  PROT 7.1  ALBUMIN 3.2*   No results for input(s): LIPASE, AMYLASE in the last 168 hours. No results for input(s): AMMONIA in the last 168 hours. CBC: Recent Labs  Lab 12/30/17 0656 12/31/17 0433  WBC 7.6 8.8  NEUTROABS 5.1 6.6  HGB 11.1* 11.7*  HCT 36.6* 39.4  MCV 84.3 84.9  PLT 262 272   Cardiac Enzymes: Recent Labs  Lab 12/30/17 0959 12/30/17 1427 12/30/17 2112  TROPONINI 0.11* 0.11* 0.08*   CBG (last 3)  No results for input(s): GLUCAP in the last 72 hours. Recent Results (from the past 240 hour(s))  MRSA PCR Screening     Status: None   Collection Time: 12/30/17  1:52 PM  Result Value Ref Range Status   MRSA by PCR NEGATIVE NEGATIVE Final    Comment:        The GeneXpert MRSA Assay (FDA approved for NASAL specimens only), is one component of a comprehensive MRSA colonization surveillance program. It is not intended to diagnose MRSA infection nor to guide or monitor treatment for MRSA infections. Performed at [facility], [address], [place], [place] [address]      Studies: Dg Chest 2 View  Result Date: 12/30/2017 CLINICAL DATA:  Chest pain, shortness of breath EXAM: CHEST - 2 VIEW COMPARISON:  06/13/2015 FINDINGS: Cardiomegaly  with vascular congestion. Mild interstitial prominence, likely interstitial edema. No confluent opacities or effusions. No acute bony abnormality. IMPRESSION: Cardiomegaly with vascular congestion and interstitial prominence, likely edema/CHF. Electronically Signed   By: Charlett Nose M.D.   On: 12/30/2017 07:44     Scheduled Meds: . aspirin EC  81 mg Oral Daily  . atorvastatin  80 mg Oral Daily  . carvedilol  25 mg Oral BID WC  . furosemide  60 mg Intravenous Q12H  . mometasone-formoterol  2 puff Inhalation BID  . potassium chloride  40 mEq Oral BID  . ranolazine  500 mg Oral BID  . sodium chloride flush  3 mL Intravenous Q12H   Continuous Infusions: . sodium chloride    . amiodarone 30 mg/hr (12/31/17 0545)  . heparin 1,300 Units/hr (12/31/17 0400)    Principal Problem:   Acute systolic (congestive) heart failure (HCC) Active Problems:   Hyperlipemia   ANEMIA   Hypertension   Coronary atherosclerosis   Chest pain   CKD (chronic kidney disease), stage III (HCC)   Shortness of breath   Chronic combined systolic and diastolic CHF (congestive  heart failure) (HCC)   SVT (supraventricular tachycardia) (HCC)   Hypertensive heart disease   Acute respiratory failure (HCC)   Hypokalemia   Malignant hypertension   Critical Care Time spent: 34 mins  Standley Dakins, MD, FAAFP [facility] Pager [phone]  If 7PM-7AM, please contact night-coverage [email or web address] Password [email or web address] 12/31/2017, 6:19 AM    LOS: 1 day

## 2017-12-31 NOTE — Progress Notes (Signed)
ANTICOAGULATION CONSULT NOTE   Pharmacy Consult for heparin Indication: ACS/STEMI  No Known Allergies  Patient Measurements: Height: 5\' 10"  (177.8 cm) Weight: 215 lb 6.2 oz (97.7 kg) IBW/kg (Calculated) : 73 Heparin Dosing Weight: 94.5 kg  Vital Signs: Temp: 98.6 F (37 C) (04/30 0400) Temp Source: Oral (04/30 0400) BP: 135/104 (04/30 0630) Pulse Rate: 79 (04/30 0630)  Labs: Recent Labs    12/30/17 0654 12/30/17 0656 12/30/17 0959 12/30/17 1417 12/30/17 1427 12/30/17 2112 12/31/17 0433  HGB  --  11.1*  --   --   --   --  11.7*  HCT  --  36.6*  --   --   --   --  39.4  PLT  --  262  --   --   --   --  272  HEPARINUNFRC  --   --   --  0.47  --   --  0.24*  CREATININE 3.78*  --   --   --   --   --  2.85*  TROPONINI  --   --  0.11*  --  0.11* 0.08*  --    Estimated Creatinine Clearance: 36.4 mL/min (A) (by C-G formula based on SCr of 2.85 mg/dL (H)).  Medical History: Past Medical History:  Diagnosis Date  . Anemia    a. 2008 in setting of profound epistaxis.  Marland Kitchen CAD (coronary artery disease)    a. NSTEMI 2008: in setting of profound epistaxis/anemia, cath showing severe diffuse RCA dz with L-R collaterals, nonobst dz in left system, managed medically. b. Type II NSTEMI 10/2008. c. Last cath 2012 - for med rx. d. 05/2013: elevated troponin ? due to ARF.  e. Ranexa added 03/2012.  . Cardiomyopathy (HCC)    a. Mixed ICM/NICM - Prior EF 25% back to 2008. b. Improved to 55% in 2012. c. Subsequent echoes: 45% in 11/2010, 15% by echo 05/2013.and 2016  . CHF (congestive heart failure) (HCC)   . Chronic kidney disease    a. Average creatine of about 2.3  . Epistaxis    a. 2008, 2009 a/w significant anemia.  Marland Kitchen History of pneumonia 08/2012, 05/2013  . Hyperlipidemia   . Hypertension    a. Difficult to control. b. Renal duplex neg for RAS 11/2012.  Marland Kitchen SVT (supraventricular tachycardia) (HCC)    a. 05/2013 tx with 6mg  adenosine in ER.   Assessment: Pharmacy consulted to dose  heparin in patient for patient with ACS/STEMI. Patient has elevated troponin of 0.10. Heparin level subtherapeutic today.   Goal of Therapy:  Heparin level 0.3-0.7 units/ml Monitor platelets by anticoagulation protocol: Yes   Plan:  Increase heparin infusion to 1500 units/hr Check anti-Xa level in 6 hrs and daily while on heparin Continue to monitor H&H and platelets  Valrie Hart, PharmD Clinical Pharmacist Pager:  [phone] 12/31/2017   12/31/2017 9:07 AM

## 2017-12-31 NOTE — Progress Notes (Addendum)
Progress Note  Patient Name: Lendon George Date of Encounter: 12/31/2017  Primary Cardiologist: Rollene Rotunda, MD   Subjective   Sleepy this morning. Breathing and leg swelling have improved. 32 beat run of ventricular tachycardia. He previously refused AICD. IV amiodarone started by internal medicine last night.  Inpatient Medications    Scheduled Meds: . aspirin EC  81 mg Oral Daily  . atorvastatin  80 mg Oral Daily  . carvedilol  25 mg Oral BID WC  . furosemide  60 mg Intravenous Q12H  . mometasone-formoterol  2 puff Inhalation BID  . potassium chloride  40 mEq Oral BID  . ranolazine  500 mg Oral BID  . sodium chloride flush  3 mL Intravenous Q12H   Continuous Infusions: . sodium chloride    . amiodarone 30 mg/hr (12/31/17 0545)  . heparin 1,500 Units/hr (12/31/17 0828)   PRN Meds: sodium chloride, acetaminophen, hydrALAZINE, labetalol, morphine injection, ondansetron (ZOFRAN) IV, sodium chloride flush   Vital Signs    Vitals:   12/31/17 0530 12/31/17 0600 12/31/17 0630 12/31/17 0915  BP: (!) 126/99 (!) 124/92 (!) 135/104 (!) 126/96  Pulse: 63 (!) 56 79 (!) 43  Resp: 16 12 (!) 24   Temp:      TempSrc:      SpO2: 100% 100% 97% 100%  Weight:      Height:        Intake/Output Summary (Last 24 hours) at 12/31/2017 0943 Last data filed at 12/31/2017 0648 Gross per 24 hour  Intake 1305.2 ml  Output 3800 ml  Net -2494.8 ml   Filed Weights   12/30/17 0638 12/30/17 1500 12/31/17 0500  Weight: 225 lb (102.1 kg) 216 lb 0.8 oz (98 kg) 215 lb 6.2 oz (97.7 kg)    Telemetry    NSR with frequent PVC's, NSVT bursts, and 32 beat run of VT - Personally Reviewed  ECG    Sinus rhythm with frequent PVC's and old inferior and anterolateral infarct - Personally Reviewed  Physical Exam   GEN: No acute distress.   Neck: No JVD Cardiac: RRR, no murmurs, rubs, or gallops.  Respiratory: Diminished throughout GI: Soft, nontender, non-distended  MS: 1-2+ pitting  bilateral lower extremity edema Neuro:  Nonfocal  Psych: Normal affect   Labs    Chemistry Recent Labs  Lab 12/30/17 0654 12/31/17 0433  NA 139 139  K 3.2* 4.3  CL 98* 99*  CO2 29 27  GLUCOSE 194* 158*  BUN 54* 43*  CREATININE 3.78* 2.85*  CALCIUM 8.4* 8.6*  PROT 7.1  --   ALBUMIN 3.2*  --   AST 21  --   ALT 14*  --   ALKPHOS 64  --   BILITOT 1.1  --   GFRNONAA 17* 24*  GFRAA 20* 28*  ANIONGAP 12 13     Hematology Recent Labs  Lab 12/30/17 0656 12/31/17 0433  WBC 7.6 8.8  RBC 4.34 4.64  HGB 11.1* 11.7*  HCT 36.6* 39.4  MCV 84.3 84.9  MCH 25.6* 25.2*  MCHC 30.3 29.7*  RDW 16.6* 16.7*  PLT 262 272    Cardiac Enzymes Recent Labs  Lab 12/30/17 0959 12/30/17 1427 12/30/17 2112  TROPONINI 0.11* 0.11* 0.08*    Recent Labs  Lab 12/30/17 0657  TROPIPOC 0.10*     BNP Recent Labs  Lab 12/30/17 0654 12/31/17 0433  BNP >4,500.0* >4,500.0*     DDimer No results for input(s): DDIMER in the last 168 hours.   Radiology  Dg Chest 2 View  Result Date: 12/30/2017 CLINICAL DATA:  Chest pain, shortness of breath EXAM: CHEST - 2 VIEW COMPARISON:  06/13/2015 FINDINGS: Cardiomegaly with vascular congestion. Mild interstitial prominence, likely interstitial edema. No confluent opacities or effusions. No acute bony abnormality. IMPRESSION: Cardiomegaly with vascular congestion and interstitial prominence, likely edema/CHF. Electronically Signed   By: Charlett Nose M.D.   On: 12/30/2017 07:44   Dg Chest Port 1 View  Result Date: 12/31/2017 CLINICAL DATA:  52 year old male with chest pain and shortness of breath. Suspected heart failure. EXAM: PORTABLE CHEST 1 VIEW COMPARISON:  12/30/2017 and earlier. FINDINGS: Portable AP upright view at 0759 hours. Stable cardiomegaly and mediastinal contours. Increased asymmetric patchy and indistinct opacity in the right lower lung since yesterday. Multilobar involvement sparing the right lung apex. No pneumothorax. Pulmonary  vascularity appears decreased elsewhere. No pleural effusion is evident. No other confluent opacity. Visualized tracheal air column is within normal limits. IMPRESSION: 1. New asymmetric, indistinct right mid and lower lung opacity since yesterday. Perhaps this is asymmetric pulmonary edema, but multilobar right lung infection is not excluded. 2. Decreased pulmonary vascularity elsewhere. Stable moderate to severe cardiomegaly. Electronically Signed   By: Odessa Fleming M.D.   On: 12/31/2017 08:33    Cardiac Studies   Echocardiogram (12/30/17):  Study Conclusions  - Procedure narrative: Transthoracic echocardiography. Image   quality was adequate. Intravenous contrast (Definity) was   administered. - Left ventricle: The cavity size was moderately dilated. Systolic   function was severely reduced. The estimated ejection fraction   was 15%. Diffuse hypokinesis. Doppler parameters are consistent   with restrictive physiology, indicative of decreased left   ventricular diastolic compliance and/or increased left atrial   pressure. Doppler parameters are consistent with high ventricular   filling pressure. Mild concentric and moderate focal basal septal   hypertrophy. - Aortic valve: There was mild regurgitation. - Left atrium: The atrium was severely dilated. - Right ventricle: The cavity size was moderately dilated. Systolic   function was severely reduced. - Right atrium: The atrium was severely dilated. - Atrial septum: No defect or patent foramen ovale was identified. - Tricuspid valve: There was moderate regurgitation. - Pulmonary arteries: Systolic pressure was severely increased. PA   peak pressure: 76 mm Hg (S). - Inferior vena cava: The vessel was dilated. The respirophasic   diameter changes were blunted (< 50%), consistent with elevated   central venous pressure. Estimated CVP 15 mmHg.  Patient Profile     52 y.o. male with cardiomyopathy, CAD, and hypertension admitted for acute on  chronic combined systolic and diastolic heart failure.  Assessment & Plan    1. Acute on chronic combined systolic and diastolic heart failure: Remains on IV Lasix 60 mg bid with nearly 3 L output in last 24 hours which I would continue. BP not optimally but better controlled. LVEF remains severely reduced by echo noted above. Continue carvedilol. I am unable to add ACE inhibitors, angiotensin receptor blockers, or angiotensin receptor-neprilysin inhibitors due to advanced chronic kidney disease.  I will resume Imdur 120 mg daily.  2. Ventricular tachycardia: On Coreg 25 mg bid and now IV amiodarone. Mg low at 1.4 which has been repleted. He previously refused AICD. We spoke again about this and I have urged him to reconsider as I warned him this is a potentially fatal arrhythmia. Continue IV amio for now.  3. CAD: Stable. Denies chest pain. Troponins peaked at 0.11, down to 0.08. Likely consistent with demand ischemia in  the context of decompensated heart failure and CKD stage IV. I will stop IV heparin. On ASA, Lipitor, Coreg, and Ranexa. I will resume Imdur 120 mg daily.  4. Hypertensive heart disease: BP better but not optimally controlled. I will resume Imdur 120 mg daily. This should improve with ongoing diuresis.  5. CKD stage IV: BUN and creatinine improving to 43 and 2.85 respectively, indicative of improving volume status.  6. Hypomagnesemia: Being repleted. Mg 1.4.   Time spent: 40 minutes   For questions or updates, please contact [facility] Please consult [email or web address] for contact info under Cardiology/STEMI.      Signed, Prentice Docker, MD  12/31/2017, 9:43 AM

## 2018-01-01 ENCOUNTER — Inpatient Hospital Stay (HOSPITAL_COMMUNITY): Payer: Medicare Other

## 2018-01-01 DIAGNOSIS — I5023 Acute on chronic systolic (congestive) heart failure: Secondary | ICD-10-CM

## 2018-01-01 DIAGNOSIS — I5021 Acute systolic (congestive) heart failure: Secondary | ICD-10-CM

## 2018-01-01 LAB — BASIC METABOLIC PANEL
ANION GAP: 12 (ref 5–15)
BUN: 47 mg/dL — ABNORMAL HIGH (ref 6–20)
CHLORIDE: 99 mmol/L — AB (ref 101–111)
CO2: 28 mmol/L (ref 22–32)
Calcium: 8.4 mg/dL — ABNORMAL LOW (ref 8.9–10.3)
Creatinine, Ser: 3 mg/dL — ABNORMAL HIGH (ref 0.61–1.24)
GFR calc Af Amer: 26 mL/min — ABNORMAL LOW (ref >60)
GFR, EST NON AFRICAN AMERICAN: 23 mL/min — AB (ref >60)
GLUCOSE: 137 mg/dL — AB (ref 65–99)
POTASSIUM: 4.2 mmol/L (ref 3.5–5.1)
Sodium: 139 mmol/L (ref 135–145)

## 2018-01-01 LAB — CBC WITH DIFFERENTIAL/PLATELET
BASOS ABS: 0 10*3/uL (ref 0.0–0.1)
Basophils Relative: 0 %
Eosinophils Absolute: 0.2 10*3/uL (ref 0.0–0.7)
Eosinophils Relative: 2 %
HEMATOCRIT: 35 % — AB (ref 39.0–52.0)
Hemoglobin: 10.8 g/dL — ABNORMAL LOW (ref 13.0–17.0)
LYMPHS PCT: 18 %
Lymphs Abs: 1.3 10*3/uL (ref 0.7–4.0)
MCH: 26.4 pg (ref 26.0–34.0)
MCHC: 30.9 g/dL (ref 30.0–36.0)
MCV: 85.6 fL (ref 78.0–100.0)
Monocytes Absolute: 0.5 10*3/uL (ref 0.1–1.0)
Monocytes Relative: 7 %
NEUTROS ABS: 5.2 10*3/uL (ref 1.7–7.7)
Neutrophils Relative %: 73 %
Platelets: 221 10*3/uL (ref 150–400)
RBC: 4.09 MIL/uL — AB (ref 4.22–5.81)
RDW: 16.5 % — ABNORMAL HIGH (ref 11.5–15.5)
WBC: 7.1 10*3/uL (ref 4.0–10.5)

## 2018-01-01 LAB — MAGNESIUM: Magnesium: 2 mg/dL (ref 1.7–2.4)

## 2018-01-01 LAB — BRAIN NATRIURETIC PEPTIDE: B Natriuretic Peptide: 1951 pg/mL — ABNORMAL HIGH (ref 0.0–100.0)

## 2018-01-01 MED ORDER — TORSEMIDE 20 MG PO TABS
20.0000 mg | ORAL_TABLET | Freq: Two times a day (BID) | ORAL | Status: DC
  Administered 2018-01-02 – 2018-01-03 (×3): 20 mg via ORAL
  Filled 2018-01-01 (×3): qty 1

## 2018-01-01 MED ORDER — HYDRALAZINE HCL 20 MG/ML IJ SOLN: 20.0000 mg | INTRAMUSCULAR | Status: DC | PRN

## 2018-01-01 MED ORDER — MAGNESIUM OXIDE 400 (241.3 MG) MG PO TABS
400.0000 mg | ORAL_TABLET | Freq: Two times a day (BID) | ORAL | Status: DC
  Administered 2018-01-01 – 2018-01-03 (×5): 400 mg via ORAL
  Filled 2018-01-01 (×5): qty 1

## 2018-01-01 NOTE — Care Management Important Message (Signed)
Important Message  Patient Details  Name: [name] MRN: [id number] Date of Birth: 11-01-1965   Medicare Important Message Given:  Yes    Renie Ora 01/01/2018, 11:09 AM

## 2018-01-01 NOTE — Care Management Note (Signed)
Case Management Note  Patient Details  Name: [name] MRN: [id number] Date of Birth: [birth year]  Subjective/Objective:  CHF. EF 10-20%. Episodes of V TACH. IV amiodarone.  Recommended for AICD previously but refused. Now agreeable to AICD. From home alone, ind with ADL's. No HH or DME pta. Has PCP, sister drives him to appointments at times, patient can drive when needed.               Action/Plan: Anticipate DC home. No CM needs know or communicated.    Expected Discharge Date:    unk              Expected Discharge Plan:  Home/Self Care  In-House Referral:     Discharge planning Services  CM Consult  Post Acute Care Choice:  NA Choice offered to:  NA  DME Arranged:    DME Agency:     HH Arranged:    HH Agency:     Status of Service:  Completed, signed off  If discussed at [employer] of Stay Meetings, dates discussed:    Additional Comments:  Elaine Middleton, Chrystine Oiler, RN 01/01/2018, 2:01 PM

## 2018-01-01 NOTE — Progress Notes (Signed)
PROGRESS NOTE  [name]  ZOX:[id number]  DOB: [birth year]  DOA: 12/30/2017 PCP: Joette Catching, MD   Brief Admission Hx: 52 year old male ischemic cardiomyopathy-prior refusal of AICD-EF 10 to 20% persistently Resistant hypertension Systolic heart failure with dry weight noted to be around 210 pounds back in 12/2013 when admitted for AECHF Chronic kidney disease probably from nephropathy-Baseline serum creatinine usually around 2 Ischemic cardiomyopathy secondary to CAD-LHC 2012 RCA occlusion  MDM/Assessment & Plan:   1. Acute systolic congestive heart failure exacerbation- patient is markedly volume overloaded with diffuse edema and will require aggressive consistent diuresis. \ Currently -4.7 L.     Continuing LasixAnd transition by cardiology to [facility] 20 twice daily-Continue Coreg 25 twice daily, Imdur 120 daily, labetalol as needed high blood pressure, see below discussion 2. Recurrent V tachycardia - Pt was symptomatic with a run of 32 beats on the evening of 4/29 and IV amiodarone started-defer to cardiology sounds like may be starting amnio oral load 5/2 a.m.-magnesium checked as below--continue replacement with PO MAg ox 400 bid 3. Hypomagnesemia - IV replacement --see above 4. Chest pain with elevated troponin- demand versus cardiogenic?  On a heparin drip.  Continuing Nitropaste for now.  Cardiology consult has been requested. .  Continue oxygen and aspirin.  Continue Ranexa for now for chronic chest pain-this has not been studied in renal disease and may need input from nephrology 5. Shortness of breath-secondary to acute congestive heart failure and treating as above continue supplemental oxygen. 6. Anemia of chronic kidney disease-we will continue to monitor closely. 7. Acute on chronic kidney disease stage III- creatinine has steadily worsened and might need to consider cardiorenal syndrome and input from nephrology prior to discharge versus outpatient  referral 8. Hypertensive heart disease-blood pressure poorly controlled and reported as difficult to control, IV hydralazine ordered as needed, continue Nitropaste, beta-blocker ordered. 9. Hyperlipidemia- continue atorvastatin 80 mg daily. 10. Hypokalemia- oral replacement ordered-continue the same for now orally potassium is within range 11. Cardiomyopathy -according to records the patient had been referred for ICD placement in the past but had declined.   DVT Prophylaxis: The patient is on a heparin infusion Code Status: Full Family Communication:   None present Disposition Plan: The patient will require intensive medical management in the stepdown unit   Consultants:  cardiology Currently on heparin and amiodarone infusions  Subjective: Awake alert pleasant but seems to have just woken up-asking me "what do you want"-explained my role in his care no overt chest pain or swelling at this time , - Fever, - chills, - cough, - sputum  Objective: Vitals:   01/01/18 0630 01/01/18 0645 01/01/18 0742 01/01/18 1119  BP: 119/78 115/87    Pulse: (!) 41 (!) 53 61 (!) 51  Resp: 18 17 18 17   Temp:   97.8 F (36.6 C) 98.2 F (36.8 C)  TempSrc:   Oral Oral  SpO2: 100% 100% 98% 100%  Weight:      Height:        Intake/Output Summary (Last 24 hours) at 01/01/2018 1128 Last data filed at 01/01/2018 1038 Gross per 24 hour  Intake 429.86 ml  Output 2375 ml  Net -1945.14 ml   Filed Weights   12/30/17 1500 12/31/17 0500 01/01/18 0500  Weight: 98 kg (216 lb 0.8 oz) 97.7 kg (215 lb 6.2 oz) 99.1 kg (218 lb 7.6 oz)     REVIEW OF SYSTEMS  As per history otherwise all reviewed and reported negative  Exam:  EOMI NCAT no  pallor no icterus no specific JVD Neck is soft supple no bruit S1-S2 seems to be in sinus bradycardia with multifocal PVCs Chest is clinically clear no other rales or rhonchi Abdomen is soft nontender no rebound no hepatosplenomegaly No lower extremity edema No  rash Euthymic and congruent  Data Reviewed: Basic Metabolic Panel: Recent Labs  Lab 12/30/17 0654 12/31/17 0433 01/01/18 0423  NA 139 139 139  K 3.2* 4.3 4.2  CL 98* 99* 99*  CO2 29 27 28   GLUCOSE 194* 158* 137*  BUN 54* 43* 47*  CREATININE 3.78* 2.85* 3.00*  CALCIUM 8.4* 8.6* 8.4*  MG  --  1.4* 2.0   Liver Function Tests: Recent Labs  Lab 12/30/17 0654  AST 21  ALT 14*  ALKPHOS 64  BILITOT 1.1  PROT 7.1  ALBUMIN 3.2*   No results for input(s): LIPASE, AMYLASE in the last 168 hours. No results for input(s): AMMONIA in the last 168 hours. CBC: Recent Labs  Lab 12/30/17 0656 12/31/17 0433 01/01/18 0423  WBC 7.6 8.8 7.1  NEUTROABS 5.1 6.6 5.2  HGB 11.1* 11.7* 10.8*  HCT 36.6* 39.4 35.0*  MCV 84.3 84.9 85.6  PLT 262 272 221   Cardiac Enzymes: Recent Labs  Lab 12/30/17 0959 12/30/17 1427 12/30/17 2112  TROPONINI 0.11* 0.11* 0.08*   CBG (last 3)  No results for input(s): GLUCAP in the last 72 hours. Recent Results (from the past 240 hour(s))  MRSA PCR Screening     Status: None   Collection Time: 12/30/17  1:52 PM  Result Value Ref Range Status   MRSA by PCR NEGATIVE NEGATIVE Final    Comment:        The GeneXpert MRSA Assay (FDA approved for NASAL specimens only), is one component of a comprehensive MRSA colonization surveillance program. It is not intended to diagnose MRSA infection nor to guide or monitor treatment for MRSA infections. Performed at [facility], [address], [place], [place] [address]      Studies: Dg Chest [facility] 1 View  Result Date: 01/01/2018 CLINICAL DATA:  52 year old male with pneumonia, chest pain and shortness of breath EXAM: PORTABLE CHEST 1 VIEW COMPARISON:  Prior chest x-ray 12/31/2017 FINDINGS: Similar appearance of the cardiopericardial silhouette with marked cardiomegaly. Mediastinal contours remain within normal limits. Persistent patchy airspace opacity in the right mid lung which is slightly progressed  compared to recent prior imaging. Mild vascular congestion but no overt edema. No pneumothorax or large effusion. Osseous structures are intact and unremarkable. IMPRESSION: 1. Similar to slightly increased patchy airspace opacity in the right mid lung consistent with the clinical history of pneumonia. 2. Stable marked cardiomegaly. 3. Pulmonary vascular congestion without overt edema. Electronically Signed   By: Malachy Moan M.D.   On: 01/01/2018 09:45   Dg Chest Port 1 View  Result Date: 12/31/2017 CLINICAL DATA:  52 year old male with chest pain and shortness of breath. Suspected heart failure. EXAM: PORTABLE CHEST 1 VIEW COMPARISON:  12/30/2017 and earlier. FINDINGS: Portable AP upright view at 0759 hours. Stable cardiomegaly and mediastinal contours. Increased asymmetric patchy and indistinct opacity in the right lower lung since yesterday. Multilobar involvement sparing the right lung apex. No pneumothorax. Pulmonary vascularity appears decreased elsewhere. No pleural effusion is evident. No other confluent opacity. Visualized tracheal air column is within normal limits. IMPRESSION: 1. New asymmetric, indistinct right mid and lower lung opacity since yesterday. Perhaps this is asymmetric pulmonary edema, but multilobar right lung infection is not excluded. 2. Decreased pulmonary vascularity  elsewhere. Stable moderate to severe cardiomegaly. Electronically Signed   By: Odessa Fleming M.D.   On: 12/31/2017 08:33     Scheduled Meds: . aspirin EC  81 mg Oral Daily  . atorvastatin  80 mg Oral Daily  . carvedilol  25 mg Oral BID WC  . isosorbide mononitrate  120 mg Oral Daily  . mometasone-formoterol  2 puff Inhalation BID  . potassium chloride  40 mEq Oral BID  . ranolazine  500 mg Oral BID  . sodium chloride flush  3 mL Intravenous Q12H  . [START ON 01/02/2018] torsemide  20 mg Oral BID   Continuous Infusions: . sodium chloride    . amiodarone 30 mg/hr (01/01/18 0323)    Principal Problem:    Acute systolic (congestive) heart failure (HCC) Active Problems:   Hyperlipemia   ANEMIA   Hypertension   Coronary atherosclerosis   Chest pain   CKD (chronic kidney disease), stage III (HCC)   Shortness of breath   Chronic combined systolic and diastolic CHF (congestive heart failure) (HCC)   SVT (supraventricular tachycardia) (HCC)   Hypertensive heart disease   Acute respiratory failure (HCC)   Hypokalemia   Malignant hypertension   Critical Care Time spent: 34 mins  Pleas Koch, MD [facility] ([phone]   If 7PM-7AM, please contact night-coverage [email or web address] Password [email or web address] 01/01/2018, 11:28 AM    LOS: 2 days

## 2018-01-01 NOTE — Progress Notes (Signed)
Progress Note  Patient Name: [name] Date of Encounter: 01/01/2018  Primary Cardiologist: Rollene Rotunda, MD   Subjective   Breathing has improved. Denies chest pain and palpitations. He has now agreed to an AICD.  Inpatient Medications    Scheduled Meds: . aspirin EC  81 mg Oral Daily  . atorvastatin  80 mg Oral Daily  . carvedilol  25 mg Oral BID WC  . furosemide  60 mg Intravenous Q12H  . isosorbide mononitrate  120 mg Oral Daily  . mometasone-formoterol  2 puff Inhalation BID  . potassium chloride  40 mEq Oral BID  . ranolazine  500 mg Oral BID  . sodium chloride flush  3 mL Intravenous Q12H   Continuous Infusions: . sodium chloride    . amiodarone 30 mg/hr (01/01/18 0323)   PRN Meds: sodium chloride, acetaminophen, hydrALAZINE, labetalol, morphine injection, sodium chloride flush   Vital Signs    Vitals:   01/01/18 0615 01/01/18 0630 01/01/18 0645 01/01/18 0742  BP: 122/87 119/78 115/87   Pulse: (!) 37 (!) 41 (!) 53 61  Resp: 20 18 17 18   Temp:    97.8 F (36.6 C)  TempSrc:    Oral  SpO2: 100% 100% 100% 98%  Weight:      Height:        Intake/Output Summary (Last 24 hours) at 01/01/2018 1017 Last data filed at 01/01/2018 0500 Gross per 24 hour  Intake 429.86 ml  Output 1775 ml  Net -1345.14 ml   Filed Weights   12/30/17 1500 12/31/17 0500 01/01/18 0500  Weight: 216 lb 0.8 oz (98 kg) 215 lb 6.2 oz (97.7 kg) 218 lb 7.6 oz (99.1 kg)    Telemetry    NSR with PVC's, 3-beat NSVT paroxysms, no sustained VT - Personally Reviewed  ECG    n/a - Personally Reviewed  Physical Exam   GEN: No acute distress.   Neck: No JVD Cardiac: RRR, no murmurs, rubs, or gallops.  Respiratory: Clear to auscultation bilaterally. GI: Soft, nontender, non-distended  MS: No edema; No deformity. Neuro:  Nonfocal  Psych: Normal affect   Labs    Chemistry Recent Labs  Lab 12/30/17 0654 12/31/17 0433 01/01/18 0423  NA 139 139 139  K 3.2* 4.3 4.2  CL 98*  99* 99*  CO2 29 27 28   GLUCOSE 194* 158* 137*  BUN 54* 43* 47*  CREATININE 3.78* 2.85* 3.00*  CALCIUM 8.4* 8.6* 8.4*  PROT 7.1  --   --   ALBUMIN 3.2*  --   --   AST 21  --   --   ALT 14*  --   --   ALKPHOS 64  --   --   BILITOT 1.1  --   --   GFRNONAA 17* 24* 23*  GFRAA 20* 28* 26*  ANIONGAP 12 13 12      Hematology Recent Labs  Lab 12/30/17 0656 12/31/17 0433 01/01/18 0423  WBC 7.6 8.8 7.1  RBC 4.34 4.64 4.09*  HGB 11.1* 11.7* 10.8*  HCT 36.6* 39.4 35.0*  MCV 84.3 84.9 85.6  MCH 25.6* 25.2* 26.4  MCHC 30.3 29.7* 30.9  RDW 16.6* 16.7* 16.5*  PLT 262 272 221    Cardiac Enzymes Recent Labs  Lab 12/30/17 0959 12/30/17 1427 12/30/17 2112  TROPONINI 0.11* 0.11* 0.08*    Recent Labs  Lab 12/30/17 0657  TROPIPOC 0.10*     BNP Recent Labs  Lab 12/30/17 0654 12/31/17 0433 01/01/18 0423  BNP >4,500.0* >4,500.0*  1,951.0*     DDimer No results for input(s): DDIMER in the last 168 hours.   Radiology    Dg Chest Port 1 View  Result Date: 01/01/2018 CLINICAL DATA:  52 year old male with pneumonia, chest pain and shortness of breath EXAM: PORTABLE CHEST 1 VIEW COMPARISON:  Prior chest x-ray 12/31/2017 FINDINGS: Similar appearance of the cardiopericardial silhouette with marked cardiomegaly. Mediastinal contours remain within normal limits. Persistent patchy airspace opacity in the right mid lung which is slightly progressed compared to recent prior imaging. Mild vascular congestion but no overt edema. No pneumothorax or large effusion. Osseous structures are intact and unremarkable. IMPRESSION: 1. Similar to slightly increased patchy airspace opacity in the right mid lung consistent with the clinical history of pneumonia. 2. Stable marked cardiomegaly. 3. Pulmonary vascular congestion without overt edema. Electronically Signed   By: Malachy Moan M.D.   On: 01/01/2018 09:45   Dg Chest Port 1 View  Result Date: 12/31/2017 CLINICAL DATA:  52 year old male with  chest pain and shortness of breath. Suspected heart failure. EXAM: PORTABLE CHEST 1 VIEW COMPARISON:  12/30/2017 and earlier. FINDINGS: Portable AP upright view at 0759 hours. Stable cardiomegaly and mediastinal contours. Increased asymmetric patchy and indistinct opacity in the right lower lung since yesterday. Multilobar involvement sparing the right lung apex. No pneumothorax. Pulmonary vascularity appears decreased elsewhere. No pleural effusion is evident. No other confluent opacity. Visualized tracheal air column is within normal limits. IMPRESSION: 1. New asymmetric, indistinct right mid and lower lung opacity since yesterday. Perhaps this is asymmetric pulmonary edema, but multilobar right lung infection is not excluded. 2. Decreased pulmonary vascularity elsewhere. Stable moderate to severe cardiomegaly. Electronically Signed   By: Odessa Fleming M.D.   On: 12/31/2017 08:33    Cardiac Studies   Echocardiogram (12/30/17):  Study Conclusions  - Procedure narrative: Transthoracic echocardiography. Image quality was adequate. Intravenous contrast (Definity) was administered. - Left ventricle: The cavity size was moderately dilated. Systolic function was severely reduced. The estimated ejection fraction was 15%. Diffuse hypokinesis. Doppler parameters are consistent with restrictive physiology, indicative of decreased left ventricular diastolic compliance and/or increased left atrial pressure. Doppler parameters are consistent with high ventricular filling pressure. Mild concentric and moderate focal basal septal hypertrophy. - Aortic valve: There was mild regurgitation. - Left atrium: The atrium was severely dilated. - Right ventricle: The cavity size was moderately dilated. Systolic function was severely reduced. - Right atrium: The atrium was severely dilated. - Atrial septum: No defect or patent foramen ovale was identified. - Tricuspid valve: There was moderate  regurgitation. - Pulmonary arteries: Systolic pressure was severely increased. PA peak pressure: 76 mm Hg (S). - Inferior vena cava: The vessel was dilated. The respirophasic diameter changes were blunted (<50%), consistent with elevated central venous pressure. Estimated CVP 15 mmHg.   Patient Profile     52 y.o. male with cardiomyopathy, CAD, and hypertension admitted for acute on chronic combined systolic and diastolic heart failure.  Assessment & Plan    1. Acute on chronic combined systolic and diastolic heart failure: Remains on IV Lasix 60 mg bid with 1.35 L output in last 24 hours. He received IV Lasix this morning. I will discontinue and start torsemide 20 mg bid starting tomorrow morning.  BP now controlled. LVEF remains severely reduced by echo noted above. Continue carvedilol and Imdur. I am unable to add ACE inhibitors, angiotensin receptor blockers, or angiotensin receptor-neprilysin inhibitors due to advanced chronic kidney disease.    2. Ventricular tachycardia: No recurrences  but still has paroxysmal VT and frequent PVC's. On Coreg 25 mg bid and IV amiodarone. Mg up to 2, previously 1.4. He previously refused AICD but now agrees to have one. Continue IV amio for now but I plan to switch to oral amiodarone 200 mg bid starting 5/2.  3. CAD: Stable. Denies chest pain. Troponins peaked at 0.11, down to 0.08. Likely consistent with demand ischemia in the context of decompensated heart failure and CKD stage IV. Continue ASA, Lipitor, Coreg, Imdur, and Ranexa.   4. Hypertensive heart disease: BP now controlled.  5. CKD stage IV: BUN and creatinine up to 47 and respectively. He received IV Lasix this morning. I will discontinue and start torsemide 20 mg bid starting tomorrow morning.   6. Hypomagnesemia: Now normal.    For questions or updates, please contact [facility] Please consult [email or web address] for contact info under Cardiology/STEMI.       Signed, Prentice Docker, MD  01/01/2018, 10:17 AM

## 2018-01-02 LAB — CBC WITH DIFFERENTIAL/PLATELET
Basophils Absolute: 0 10*3/uL (ref 0.0–0.1)
Basophils Relative: 0 %
EOS ABS: 0.2 10*3/uL (ref 0.0–0.7)
EOS PCT: 3 %
HCT: 37.7 % — ABNORMAL LOW (ref 39.0–52.0)
HEMOGLOBIN: 11.4 g/dL — AB (ref 13.0–17.0)
LYMPHS ABS: 1.2 10*3/uL (ref 0.7–4.0)
Lymphocytes Relative: 17 %
MCH: 26 pg (ref 26.0–34.0)
MCHC: 30.2 g/dL (ref 30.0–36.0)
MCV: 86.1 fL (ref 78.0–100.0)
Monocytes Absolute: 0.5 10*3/uL (ref 0.1–1.0)
Monocytes Relative: 8 %
Neutro Abs: 5.1 10*3/uL (ref 1.7–7.7)
Neutrophils Relative %: 72 %
PLATELETS: 228 10*3/uL (ref 150–400)
RBC: 4.38 MIL/uL (ref 4.22–5.81)
RDW: 16.4 % — AB (ref 11.5–15.5)
WBC: 7 10*3/uL (ref 4.0–10.5)

## 2018-01-02 LAB — HEPATIC FUNCTION PANEL
ALK PHOS: 65 U/L (ref 38–126)
ALT: 12 U/L — ABNORMAL LOW (ref 17–63)
AST: 14 U/L — ABNORMAL LOW (ref 15–41)
Albumin: 3.1 g/dL — ABNORMAL LOW (ref 3.5–5.0)
BILIRUBIN INDIRECT: 0.8 mg/dL (ref 0.3–0.9)
Bilirubin, Direct: 0.3 mg/dL (ref 0.1–0.5)
TOTAL PROTEIN: 7 g/dL (ref 6.5–8.1)
Total Bilirubin: 1.1 mg/dL (ref 0.3–1.2)

## 2018-01-02 LAB — BASIC METABOLIC PANEL
Anion gap: 12 (ref 5–15)
BUN: 49 mg/dL — AB (ref 6–20)
CHLORIDE: 97 mmol/L — AB (ref 101–111)
CO2: 26 mmol/L (ref 22–32)
CREATININE: 3.03 mg/dL — AB (ref 0.61–1.24)
Calcium: 8.5 mg/dL — ABNORMAL LOW (ref 8.9–10.3)
GFR calc Af Amer: 26 mL/min — ABNORMAL LOW (ref >60)
GFR calc non Af Amer: 22 mL/min — ABNORMAL LOW (ref >60)
GLUCOSE: 182 mg/dL — AB (ref 65–99)
Potassium: 5.1 mmol/L (ref 3.5–5.1)
Sodium: 135 mmol/L (ref 135–145)

## 2018-01-02 LAB — RENAL FUNCTION PANEL
Albumin: 3.1 g/dL — ABNORMAL LOW (ref 3.5–5.0)
Anion gap: 11 (ref 5–15)
BUN: 49 mg/dL — AB (ref 6–20)
CHLORIDE: 96 mmol/L — AB (ref 101–111)
CO2: 27 mmol/L (ref 22–32)
Calcium: 8.5 mg/dL — ABNORMAL LOW (ref 8.9–10.3)
Creatinine, Ser: 2.96 mg/dL — ABNORMAL HIGH (ref 0.61–1.24)
GFR calc Af Amer: 27 mL/min — ABNORMAL LOW (ref >60)
GFR, EST NON AFRICAN AMERICAN: 23 mL/min — AB (ref >60)
Glucose, Bld: 182 mg/dL — ABNORMAL HIGH (ref 65–99)
POTASSIUM: 5 mmol/L (ref 3.5–5.1)
Phosphorus: 3.5 mg/dL (ref 2.5–4.6)
Sodium: 134 mmol/L — ABNORMAL LOW (ref 135–145)

## 2018-01-02 LAB — TSH: TSH: 3.314 u[IU]/mL (ref 0.350–4.500)

## 2018-01-02 LAB — BRAIN NATRIURETIC PEPTIDE: B Natriuretic Peptide: 1441 pg/mL — ABNORMAL HIGH (ref 0.0–100.0)

## 2018-01-02 LAB — MAGNESIUM: MAGNESIUM: 2 mg/dL (ref 1.7–2.4)

## 2018-01-02 MED ORDER — AMIODARONE HCL 200 MG PO TABS
200.0000 mg | ORAL_TABLET | Freq: Two times a day (BID) | ORAL | Status: DC
  Administered 2018-01-02 – 2018-01-03 (×3): 200 mg via ORAL
  Filled 2018-01-02 (×3): qty 1

## 2018-01-02 MED ORDER — HYDRALAZINE HCL 25 MG PO TABS
25.0000 mg | ORAL_TABLET | Freq: Three times a day (TID) | ORAL | Status: DC
  Administered 2018-01-02 (×3): 25 mg via ORAL
  Filled 2018-01-02 (×3): qty 1

## 2018-01-02 MED ORDER — POTASSIUM CHLORIDE CRYS ER 20 MEQ PO TBCR
40.0000 meq | EXTENDED_RELEASE_TABLET | Freq: Two times a day (BID) | ORAL | Status: DC
  Administered 2018-01-03: 40 meq via ORAL
  Filled 2018-01-02: qty 2

## 2018-01-02 MED ORDER — ENOXAPARIN SODIUM 60 MG/0.6ML ~~LOC~~ SOLN
50.0000 mg | SUBCUTANEOUS | Status: DC
  Administered 2018-01-02 – 2018-01-03 (×2): 50 mg via SUBCUTANEOUS
  Filled 2018-01-02 (×2): qty 0.6

## 2018-01-02 NOTE — Progress Notes (Signed)
ANTICOAGULATION CONSULT NOTE - Initial Consult  Pharmacy Consult for LOVENOX Indication: VTE prophylaxis  No Known Allergies  Patient Measurements: Height: 5\' 10"  (177.8 cm) Weight: 222 lb 10.6 oz (101 kg) IBW/kg (Calculated) : 73  Vital Signs: Temp: 97.6 F (36.4 C) (05/02 0750) Temp Source: Oral (05/02 0750) BP: 143/113 (05/02 0645) Pulse Rate: 59 (05/02 0750)  Labs: Recent Labs    12/30/17 1417 12/30/17 1427 12/30/17 2112  12/31/17 0433 01/01/18 0423 01/02/18 0431  HGB  --   --   --    < > 11.7* 10.8* 11.4*  HCT  --   --   --   --  39.4 35.0* 37.7*  PLT  --   --   --   --  272 221 228  HEPARINUNFRC 0.47  --   --   --  0.24*  --   --   CREATININE  --   --   --   --  2.85* 3.00* 3.03*  2.96*  TROPONINI  --  0.11* 0.08*  --   --   --   --    < > = values in this interval not displayed.   Estimated Creatinine Clearance: 34.7 mL/min (A) (by C-G formula based on SCr of 3.03 mg/dL (H)).  Medical History: Past Medical History:  Diagnosis Date  . Anemia    a. 2008 in setting of profound epistaxis.  Marland Kitchen CAD (coronary artery disease)    a. NSTEMI 2008: in setting of profound epistaxis/anemia, cath showing severe diffuse RCA dz with L-R collaterals, nonobst dz in left system, managed medically. b. Type II NSTEMI 10/2008. c. Last cath 2012 - for med rx. d. 05/2013: elevated troponin ? due to ARF.  e. Ranexa added 03/2012.  . Cardiomyopathy (HCC)    a. Mixed ICM/NICM - Prior EF 25% back to 2008. b. Improved to 55% in 2012. c. Subsequent echoes: 45% in 11/2010, 15% by echo 05/2013.and 2016  . CHF (congestive heart failure) (HCC)   . Chronic kidney disease    a. Average creatine of about 2.3  . Epistaxis    a. 2008, 2009 a/w significant anemia.  Marland Kitchen History of pneumonia 08/2012, 05/2013  . Hyperlipidemia   . Hypertension    a. Difficult to control. b. Renal duplex neg for RAS 11/2012.  Marland Kitchen SVT (supraventricular tachycardia) (HCC)    a. 05/2013 tx with 6mg  adenosine in ER.    Medications:  Medications Prior to Admission  Medication Sig Dispense Refill Last Dose  . aspirin 81 MG tablet Take 1 tablet (81 mg total) by mouth daily.   12/29/2017 at Unknown time  . atorvastatin (LIPITOR) 80 MG tablet Take 80 mg by mouth daily.   12/29/2017 at Unknown time  . budesonide-formoterol (SYMBICORT) 160-4.5 MCG/ACT inhaler Inhale 2 puffs into the lungs 2 (two) times daily.   12/29/2017 at Unknown time  . calcitRIOL (ROCALTROL) 0.25 MCG capsule Take 0.25 mcg by mouth 3 (three) times a week. On Monday, Wednesday, and Friday.   Past Week at Unknown time  . carvedilol (COREG) 25 MG tablet Take 2 tablets (50 mg total) by mouth 2 (two) times daily with a meal. 120 tablet 2 12/29/2017 at 1830  . cholecalciferol (VITAMIN D) 1000 UNITS tablet Take 1,000 Units by mouth daily.   12/29/2017 at Unknown time  . Febuxostat (ULORIC) 80 MG TABS Take 1 tablet by mouth daily.   12/29/2017 at Unknown time  . hydrALAZINE (APRESOLINE) 100 MG tablet Take 1 tablet (100 mg  total) by mouth 3 (three) times daily. 90 tablet 11 12/29/2017 at Unknown time  . HYDROcodone-acetaminophen (NORCO/VICODIN) 5-325 MG per tablet Take 1 tablet by mouth every 4 (four) hours as needed for moderate pain. 15 tablet 0 12/29/2017 at Unknown time  . isosorbide mononitrate (IMDUR) 120 MG 24 hr tablet TAKE 1 TABLET (120 MG TOTAL) BY MOUTH DAILY. 30 tablet 6 12/29/2017 at Unknown time  . nitroGLYCERIN (NITROSTAT) 0.4 MG SL tablet Place 1 tablet (0.4 mg total) under the tongue every 5 (five) minutes as needed for chest pain. KEEP OV. 25 tablet 0 12/29/2017 at Unknown time  . potassium chloride SA (K-DUR,KLOR-CON) 20 MEQ tablet TAKE ONE TABLET BY MOUTH 2 TIMES A DAY 60 tablet 0 12/29/2017 at Unknown time  . ranolazine (RANEXA) 500 MG 12 hr tablet TAKE ONE TABLET BY MOUTH 2 TIMES A DAY **CALL OFFICE TO MAKE APPOINTMENT** 60 tablet 2 12/29/2017 at Unknown time  . torsemide (DEMADEX) 20 MG tablet Take 20 mg by mouth 2 (two) times daily.   12/29/2017  at Unknown time    Assessment: 52yo obese male. Pharmacy asked to add Lovenox for VTE px.  ClCr borderline for dose reduction.   Goal of Therapy:  VTE prophylaxis Monitor platelets by anticoagulation protocol: Yes   Plan:  Lovenox 50mg  SQ q24hrs (50mg  per dose) Monitor labs, CBC  [name] A 01/02/2018,11:37 AM

## 2018-01-02 NOTE — Progress Notes (Signed)
PROGRESS NOTE    [name]  RUE:[id number] DOB: 02-03-1966 DOA: 12/30/2017 PCP: Joette Catching, MD     Brief Narrative:  52 year old man admitted from home on 4/29 due to systolic heart failure.  He has an EF of 10 to 20% and has previously refused an AICD.  He now appears euvolemic and all of his medications have been transitioned to the p.o. route.  On 5/2 cardiology has decided to commence insurance approval for a LifeVest.   Assessment & Plan:   Principal Problem:   Acute systolic (congestive) heart failure (HCC) Active Problems:   Hyperlipemia   ANEMIA   Hypertension   Coronary atherosclerosis   Chest pain   CKD (chronic kidney disease), stage III (HCC)   Shortness of breath   Chronic combined systolic and diastolic CHF (congestive heart failure) (HCC)   SVT (supraventricular tachycardia) (HCC)   Hypertensive heart disease   Acute respiratory failure (HCC)   Hypokalemia   Malignant hypertension   Acute on chronic systolic CHF -Ejection fraction of 10 to 20%. -Has diuresed a total of 4.6 L since admission, and now appears euvolemic. -Has been transitioned to torsemide 20 mg twice daily for diuresis. -Unable to add ACE inhibitors, ARB use or ARNI due to advanced chronic kidney disease.  Ventricular tachycardia -No recurrences, however still has paroxysmal nonsustained VT and frequent PVCs. -Is on Coreg 25 mg twice daily and amiodarone 200 mg twice daily, electrolytes are normal. -He had previously refused an AICD however now agrees.  Per cardiology next appointment with EP as later this month. -In the meantime they have started arrangements to provide patient with a LifeVest.  Chronic kidney disease stage III- IV -Creatinine remains stable at baseline of around 2.9.  Hyperlipidemia -Continue Lipitor 80 mg daily  Hypertension with hypertensive heart disease -BP remains mildly elevated, cardiology has added hydralazine 25 mg 3 times daily on 5/2.   DVT  prophylaxis: Lovenox Code Status: Full code Family Communication: Patient only Disposition Plan: Transfer to telemetry, anticipate discharge home on 5/3 pending insurance approval for LifeVest  Consultants:   Cardiology  Procedures:   None  Antimicrobials:  Anti-infectives (From admission, onward)   None       Subjective: Feels better, no chest pain today, no shortness of breath, anxious for discharge home  Objective: Vitals:   01/02/18 0545 01/02/18 0630 01/02/18 0645 01/02/18 0750  BP: (!) 120/94 (!) 141/112 (!) 143/113   Pulse: (!) 53 (!) 59 (!) 58 (!) 59  Resp: 10 (!) 26 (!) 22 (!) 23  Temp:    97.6 F (36.4 C)  TempSrc:    Oral  SpO2: 100% 98% 98% 96%  Weight:      Height:        Intake/Output Summary (Last 24 hours) at 01/02/2018 1056 Last data filed at 01/02/2018 0630 Gross per 24 hour  Intake 680 ml  Output 700 ml  Net -20 ml   Filed Weights   12/31/17 0500 01/01/18 0500 01/02/18 0500  Weight: 97.7 kg (215 lb 6.2 oz) 99.1 kg (218 lb 7.6 oz) 101 kg (222 lb 10.6 oz)    Examination:  General exam: Alert, awake, oriented x 3 Respiratory system: Clear to auscultation. Respiratory effort normal. Cardiovascular system:RRR. No murmurs, rubs, gallops. Gastrointestinal system: Abdomen is nondistended, soft and nontender. No organomegaly or masses felt. Normal bowel sounds heard. Central nervous system: Alert and oriented. No focal neurological deficits. Extremities: No C/C/E, +pedal pulses Skin: No rashes, lesions or ulcers Psychiatry: Judgement  and insight appear normal. Mood & affect appropriate.     Data Reviewed: I have personally reviewed following labs and imaging studies  CBC: Recent Labs  Lab 12/30/17 0656 12/31/17 0433 01/01/18 0423 01/02/18 0431  WBC 7.6 8.8 7.1 7.0  NEUTROABS 5.1 6.6 5.2 5.1  HGB 11.1* 11.7* 10.8* 11.4*  HCT 36.6* 39.4 35.0* 37.7*  MCV 84.3 84.9 85.6 86.1  PLT 262 272 221 228   Basic Metabolic Panel: Recent Labs    Lab 12/30/17 0654 12/31/17 0433 01/01/18 0423 01/02/18 0431  NA 139 139 139 135  134*  K 3.2* 4.3 4.2 5.1  5.0  CL 98* 99* 99* 97*  96*  CO2 29 27 28 26  27   GLUCOSE 194* 158* 137* 182*  182*  BUN 54* 43* 47* 49*  49*  CREATININE 3.78* 2.85* 3.00* 3.03*  2.96*  CALCIUM 8.4* 8.6* 8.4* 8.5*  8.5*  MG  --  1.4* 2.0 2.0  PHOS  --   --   --  3.5   GFR: Estimated Creatinine Clearance: 34.7 mL/min (A) (by C-G formula based on SCr of 3.03 mg/dL (H)). Liver Function Tests: Recent Labs  Lab 12/30/17 0654 01/02/18 0431  AST 21 14*  ALT 14* 12*  ALKPHOS 64 65  BILITOT 1.1 1.1  PROT 7.1 7.0  ALBUMIN 3.2* 3.1*  3.1*   No results for input(s): LIPASE, AMYLASE in the last 168 hours. No results for input(s): AMMONIA in the last 168 hours. Coagulation Profile: No results for input(s): INR, PROTIME in the last 168 hours. Cardiac Enzymes: Recent Labs  Lab 12/30/17 0959 12/30/17 1427 12/30/17 2112  TROPONINI 0.11* 0.11* 0.08*   BNP (last 3 results) No results for input(s): PROBNP in the last 8760 hours. HbA1C: No results for input(s): HGBA1C in the last 72 hours. CBG: No results for input(s): GLUCAP in the last 168 hours. Lipid Profile: No results for input(s): CHOL, HDL, LDLCALC, TRIG, CHOLHDL, LDLDIRECT in the last 72 hours. Thyroid Function Tests: Recent Labs    01/02/18 0431  TSH 3.314   Anemia Panel: No results for input(s): VITAMINB12, FOLATE, FERRITIN, TIBC, IRON, RETICCTPCT in the last 72 hours. Urine analysis:    Component Value Date/Time   COLORURINE AMBER (A) 05/10/2013 0404   APPEARANCEUR CLOUDY (A) 05/10/2013 0404   LABSPEC 1.012 05/10/2013 0404   PHURINE 5.5 05/10/2013 0404   GLUCOSEU NEGATIVE 05/10/2013 0404   HGBUR TRACE (A) 05/10/2013 0404   BILIRUBINUR NEGATIVE 05/10/2013 0404   KETONESUR NEGATIVE 05/10/2013 0404   PROTEINUR 100 (A) 05/10/2013 0404   UROBILINOGEN 1.0 05/10/2013 0404   NITRITE NEGATIVE 05/10/2013 0404   LEUKOCYTESUR TRACE  (A) 05/10/2013 0404   Sepsis Labs: @LABRCNTIP (procalcitonin:4,lacticidven:4)  ) Recent Results (from the past 240 hour(s))  MRSA PCR Screening     Status: None   Collection Time: 12/30/17  1:52 PM  Result Value Ref Range Status   MRSA by PCR NEGATIVE NEGATIVE Final    Comment:        The GeneXpert MRSA Assay (FDA approved for NASAL specimens only), is one component of a comprehensive MRSA colonization surveillance program. It is not intended to diagnose MRSA infection nor to guide or monitor treatment for MRSA infections. Performed at [facility], [address], [place], [place] [address]          Radiology Studies: Dg Chest [facility] 1 View  Result Date: 01/01/2018 CLINICAL DATA:  52 year old male with pneumonia, chest pain and shortness of breath EXAM: PORTABLE CHEST 1  VIEW COMPARISON:  Prior chest x-ray 12/31/2017 FINDINGS: Similar appearance of the cardiopericardial silhouette with marked cardiomegaly. Mediastinal contours remain within normal limits. Persistent patchy airspace opacity in the right mid lung which is slightly progressed compared to recent prior imaging. Mild vascular congestion but no overt edema. No pneumothorax or large effusion. Osseous structures are intact and unremarkable. IMPRESSION: 1. Similar to slightly increased patchy airspace opacity in the right mid lung consistent with the clinical history of pneumonia. 2. Stable marked cardiomegaly. 3. Pulmonary vascular congestion without overt edema. Electronically Signed   By: Malachy Moan M.D.   On: 01/01/2018 09:45        Scheduled Meds: . amiodarone  200 mg Oral BID  . aspirin EC  81 mg Oral Daily  . atorvastatin  80 mg Oral Daily  . carvedilol  25 mg Oral BID WC  . hydrALAZINE  25 mg Oral Q8H  . isosorbide mononitrate  120 mg Oral Daily  . magnesium oxide  400 mg Oral BID  . mometasone-formoterol  2 puff Inhalation BID  . [START ON 01/03/2018] potassium chloride  40 mEq Oral BID  . ranolazine   500 mg Oral BID  . sodium chloride flush  3 mL Intravenous Q12H  . torsemide  20 mg Oral BID   Continuous Infusions: . sodium chloride       LOS: 3 days    Time spent: 35 minutes. Greater than 50% of this time was spent in direct contact with the patient, coordinating care and discussing relevant ongoing clinical issues, including importance of waiting for LifeVest approval given severely decreased ejection fraction and history of ventricular tachycardia, advised of risk of sudden death where he to elect to leave without LifeVest.  Also discussed importance of keeping appointment with EP cardiology for placement of an AICD.     Chaya Jan, MD [facility] Pager [phone]  If 7PM-7AM, please contact night-coverage [email or web address] Password [email or web address] 01/02/2018, 10:56 AM

## 2018-01-02 NOTE — Progress Notes (Signed)
**Note De-identified [name] Obfuscation** STAT EKG complete; results reported to RN 

## 2018-01-02 NOTE — Progress Notes (Signed)
    Paperwork for [employer] has been faxed (paper copies in Shadow Chart). Called Life Vest representative to make him aware of the need for vest placement with plans for anticipated discharge tomorrow.   Signed, Ellsworth Lennox, PA-C 01/02/2018, 11:12 AM Pager: [phone]

## 2018-01-02 NOTE — Progress Notes (Addendum)
Progress Note  Patient Name: [name] Date of Encounter: 01/02/2018  Primary Cardiologist: Rollene Rotunda, MD   Subjective   Had some chest pain earlier along with a headache. Resolved within 2 minutes after sitting up. Was also given morphine. I reviewed the ECG which is unchanged (no acute ischemia, old anterolateral and inferior infarcts with nonspecific IVCD). He is feeling better now. Wants to go home.  Inpatient Medications    Scheduled Meds: . aspirin EC  81 mg Oral Daily  . atorvastatin  80 mg Oral Daily  . carvedilol  25 mg Oral BID WC  . isosorbide mononitrate  120 mg Oral Daily  . magnesium oxide  400 mg Oral BID  . mometasone-formoterol  2 puff Inhalation BID  . [START ON 01/03/2018] potassium chloride  40 mEq Oral BID  . ranolazine  500 mg Oral BID  . sodium chloride flush  3 mL Intravenous Q12H  . torsemide  20 mg Oral BID   Continuous Infusions: . sodium chloride    . amiodarone 30 mg/hr (01/02/18 0456)   PRN Meds: sodium chloride, acetaminophen, hydrALAZINE, labetalol, morphine injection, sodium chloride flush   Vital Signs    Vitals:   01/02/18 0545 01/02/18 0630 01/02/18 0645 01/02/18 0750  BP: (!) 120/94 (!) 141/112 (!) 143/113   Pulse: (!) 53 (!) 59 (!) 58 (!) 59  Resp: 10 (!) 26 (!) 22 (!) 23  Temp:    97.6 F (36.4 C)  TempSrc:    Oral  SpO2: 100% 98% 98% 96%  Weight:      Height:        Intake/Output Summary (Last 24 hours) at 01/02/2018 0836 Last data filed at 01/02/2018 0630 Gross per 24 hour  Intake 680 ml  Output 1300 ml  Net -620 ml   Filed Weights   12/31/17 0500 01/01/18 0500 01/02/18 0500  Weight: 215 lb 6.2 oz (97.7 kg) 218 lb 7.6 oz (99.1 kg) 222 lb 10.6 oz (101 kg)    Telemetry   Sinus rhythm/sinus brady with PVC's, one 5-beat NSVT - Personally Reviewed  ECG    Sinus bradycardia, old anterolateral and inferior infarcts with nonspecific IVCD - Personally Reviewed  Physical Exam   GEN: No acute distress.   Neck:  No JVD Cardiac: RRR, no murmurs, rubs, or gallops.  Respiratory: Clear to auscultation bilaterally. GI: Soft, nontender, non-distended  MS: No edema; No deformity. Neuro:  Nonfocal  Psych: Normal affect   Labs    Chemistry Recent Labs  Lab 12/30/17 0654 12/31/17 0433 01/01/18 0423 01/02/18 0431  NA 139 139 139 135  134*  K 3.2* 4.3 4.2 5.1  5.0  CL 98* 99* 99* 97*  96*  CO2 29 27 28 26  27   GLUCOSE 194* 158* 137* 182*  182*  BUN 54* 43* 47* 49*  49*  CREATININE 3.78* 2.85* 3.00* 3.03*  2.96*  CALCIUM 8.4* 8.6* 8.4* 8.5*  8.5*  PROT 7.1  --   --  7.0  ALBUMIN 3.2*  --   --  3.1*  3.1*  AST 21  --   --  14*  ALT 14*  --   --  12*  ALKPHOS 64  --   --  65  BILITOT 1.1  --   --  1.1  GFRNONAA 17* 24* 23* 22*  23*  GFRAA 20* 28* 26* 26*  27*  ANIONGAP 12 13 12 12  11      Hematology Recent Labs  Lab 12/31/17  0350 01/01/18 0423 01/02/18 0431  WBC 8.8 7.1 7.0  RBC 4.64 4.09* 4.38  HGB 11.7* 10.8* 11.4*  HCT 39.4 35.0* 37.7*  MCV 84.9 85.6 86.1  MCH 25.2* 26.4 26.0  MCHC 29.7* 30.9 30.2  RDW 16.7* 16.5* 16.4*  PLT 272 221 228    Cardiac Enzymes Recent Labs  Lab 12/30/17 0959 12/30/17 1427 12/30/17 2112  TROPONINI 0.11* 0.11* 0.08*    Recent Labs  Lab 12/30/17 0657  TROPIPOC 0.10*     BNP Recent Labs  Lab 12/31/17 0433 01/01/18 0423 01/02/18 0431  BNP >4,500.0* 1,951.0* 1,441.0*     DDimer No results for input(s): DDIMER in the last 168 hours.   Radiology    Dg Chest Port 1 View  Result Date: 01/01/2018 CLINICAL DATA:  52 year old male with pneumonia, chest pain and shortness of breath EXAM: PORTABLE CHEST 1 VIEW COMPARISON:  Prior chest x-ray 12/31/2017 FINDINGS: Similar appearance of the cardiopericardial silhouette with marked cardiomegaly. Mediastinal contours remain within normal limits. Persistent patchy airspace opacity in the right mid lung which is slightly progressed compared to recent prior imaging. Mild vascular congestion  but no overt edema. No pneumothorax or large effusion. Osseous structures are intact and unremarkable. IMPRESSION: 1. Similar to slightly increased patchy airspace opacity in the right mid lung consistent with the clinical history of pneumonia. 2. Stable marked cardiomegaly. 3. Pulmonary vascular congestion without overt edema. Electronically Signed   By: Malachy Moan M.D.   On: 01/01/2018 09:45    Cardiac Studies   Echocardiogram (12/30/17):  Study Conclusions  - Procedure narrative: Transthoracic echocardiography. Image quality was adequate. Intravenous contrast (Definity) was administered. - Left ventricle: The cavity size was moderately dilated. Systolic function was severely reduced. The estimated ejection fraction was 15%. Diffuse hypokinesis. Doppler parameters are consistent with restrictive physiology, indicative of decreased left ventricular diastolic compliance and/or increased left atrial pressure. Doppler parameters are consistent with high ventricular filling pressure. Mild concentric and moderate focal basal septal hypertrophy. - Aortic valve: There was mild regurgitation. - Left atrium: The atrium was severely dilated. - Right ventricle: The cavity size was moderately dilated. Systolic function was severely reduced. - Right atrium: The atrium was severely dilated. - Atrial septum: No defect or patent foramen ovale was identified. - Tricuspid valve: There was moderate regurgitation. - Pulmonary arteries: Systolic pressure was severely increased. PA peak pressure: 76 mm Hg (S). - Inferior vena cava: The vessel was dilated. The respirophasic diameter changes were blunted (<50%), consistent with elevated central venous pressure. Estimated CVP 15 mmHg.    Patient Profile     52 y.o. male with cardiomyopathy, CAD, and hypertension admitted for acute on chronic combined systolic and diastolic heart failure.   Assessment & Plan    1.  Acute on chronic combined systolic and diastolic heart failure: Now on torsemide 20 mg bid. 420 cc output in last 24 hrs. He has reached euvolemia. BP mildly elevated this morning. LVEF remains severely reduced by echo noted above. Continue carvedilol and Imdur. I will add hydralazine 25 mg tid. I am unable to add ACE inhibitors, angiotensin receptor blockers, or angiotensin receptor-neprilysin inhibitors due to advanced chronic kidney disease.  2. Ventricular tachycardia: No recurrences but still has paroxysmal NSVT and frequent PVC's. On Coreg 25 mg bid and IV amiodarone. I will discontinue IV amiodarone and switch to oral 200 mg bid. Mg remains normal at 2, previously 1.4. He previously refused AICD but now agrees to have one.  The earliest available appointment with  EP is later this month. I will arrange for him to be provided with a LifeVest.  3. CAD: Had some chest pain earlier this morning, has since resolved. ECG unchanged (no acute ischemia). Troponins peaked at 0.11, down to 0.08. Likely consistent with demand ischemia in the context of decompensated heart failure and CKD stage IV. Continue ASA, Lipitor, Coreg, Imdur, and Ranexa.   4. Hypertensive heart disease: BP mildly elevated. I will add hydralazine 25 mg tid.  5. CKD stage IV: BUN and creatinine 49 and 2.96 respectively. Stable.  6. Hypomagnesemia: Now normal.    For questions or updates, please contact [facility] Please consult [email or web address] for contact info under Cardiology/STEMI.      Signed, Prentice Docker, MD  01/02/2018, 8:36 AM

## 2018-01-02 NOTE — Progress Notes (Signed)
Inpatient Diabetes Program Recommendations  AACE/ADA: New Consensus Statement on Inpatient Glycemic Control (2015)  Target Ranges:  Prepandial:   less than 140 mg/dL      Peak postprandial:   less than 180 mg/dL (1-2 hours)      Critically ill patients:  140 - 180 mg/dL  Results for [name], [name] (MRN [id number]) as of 01/02/2018 09:09  Ref. Range 12/30/2017 06:54 12/31/2017 04:33 01/01/2018 04:23 01/02/2018 04:31 01/02/2018 04:31  Glucose Latest Ref Range: 65 - 99 mg/dL 832 (H) 549 (H) 826 (H) 182 (H) 182 (H)   Results for [name], [name] (MRN [id number]) as of 01/02/2018 09:09  Ref. Range 05/23/2009 04:25  Hemoglobin A1C Latest Ref Range: 4.6 - 6.1 % 5.7...   Review of Glycemic Control  Diabetes history: No Outpatient Diabetes medications: NA Current orders for Inpatient glycemic control: None  Inpatient Diabetes Program Recommendations:  Correction (SSI): While inpatient, please order CBGs with Novolog correction scale ACHS. HgbA1C: Please consider ordering an A1C to evaluate glycemic control over the past 2-3 months.  NOTE: No DM history noted in chart. Lab glucose has ranged from 137-194 mg/dl since admitted.   Thanks, Orlando Penner, RN, MSN, CDE Diabetes Coordinator Inpatient Diabetes Program [phone] (Team Pager from 8am to 5pm)

## 2018-01-02 NOTE — Progress Notes (Signed)
Pt c/o CP and headache when this nurse entered the room. Pain 6/10 in center of chest. Pt states " sharp squeezing pain." Denies any radiating factors. No nausea, diaphosesis or SOB. BP 136/106 pulse 56. Pt states that the pain lasted and then went away. He states " when can I get out of here? It's probably because I didn't get any rest last night." Notified RT and she obtained EKG. Gave am dose of carvedilol and torsemide. Will make Dr. Ardyth Harps and Cardiologist aware of this occurance. Will continue to monitor.

## 2018-01-03 DIAGNOSIS — N179 Acute kidney failure, unspecified: Secondary | ICD-10-CM

## 2018-01-03 LAB — BASIC METABOLIC PANEL
Anion gap: 10 (ref 5–15)
BUN: 50 mg/dL — AB (ref 6–20)
CHLORIDE: 98 mmol/L — AB (ref 101–111)
CO2: 28 mmol/L (ref 22–32)
CREATININE: 3.01 mg/dL — AB (ref 0.61–1.24)
Calcium: 8.5 mg/dL — ABNORMAL LOW (ref 8.9–10.3)
GFR calc Af Amer: 26 mL/min — ABNORMAL LOW (ref >60)
GFR calc non Af Amer: 23 mL/min — ABNORMAL LOW (ref >60)
GLUCOSE: 160 mg/dL — AB (ref 65–99)
Potassium: 4.6 mmol/L (ref 3.5–5.1)
Sodium: 136 mmol/L (ref 135–145)

## 2018-01-03 LAB — MAGNESIUM: MAGNESIUM: 1.8 mg/dL (ref 1.7–2.4)

## 2018-01-03 LAB — CBC WITH DIFFERENTIAL/PLATELET
Basophils Absolute: 0 10*3/uL (ref 0.0–0.1)
Basophils Relative: 1 %
EOS ABS: 0.2 10*3/uL (ref 0.0–0.7)
Eosinophils Relative: 3 %
HCT: 34.7 % — ABNORMAL LOW (ref 39.0–52.0)
HEMOGLOBIN: 10.6 g/dL — AB (ref 13.0–17.0)
LYMPHS ABS: 1.4 10*3/uL (ref 0.7–4.0)
Lymphocytes Relative: 22 %
MCH: 25.7 pg — AB (ref 26.0–34.0)
MCHC: 30.5 g/dL (ref 30.0–36.0)
MCV: 84.2 fL (ref 78.0–100.0)
MONOS PCT: 9 %
Monocytes Absolute: 0.5 10*3/uL (ref 0.1–1.0)
NEUTROS PCT: 65 %
Neutro Abs: 4.1 10*3/uL (ref 1.7–7.7)
Platelets: 239 10*3/uL (ref 150–400)
RBC: 4.12 MIL/uL — ABNORMAL LOW (ref 4.22–5.81)
RDW: 16.2 % — ABNORMAL HIGH (ref 11.5–15.5)
WBC: 6.3 10*3/uL (ref 4.0–10.5)

## 2018-01-03 LAB — BRAIN NATRIURETIC PEPTIDE: B Natriuretic Peptide: 2180 pg/mL — ABNORMAL HIGH (ref 0.0–100.0)

## 2018-01-03 MED ORDER — CARVEDILOL 25 MG PO TABS: 25.0000 mg | ORAL_TABLET | Freq: Two times a day (BID) | ORAL | 2 refills | Status: DC

## 2018-01-03 MED ORDER — MAGNESIUM OXIDE 400 (241.3 MG) MG PO TABS: 400.0000 mg | ORAL_TABLET | Freq: Two times a day (BID) | ORAL | 0 refills | Status: AC

## 2018-01-03 MED ORDER — POTASSIUM CHLORIDE CRYS ER 20 MEQ PO TBCR: 40.0000 meq | EXTENDED_RELEASE_TABLET | Freq: Two times a day (BID) | ORAL | 2 refills | Status: DC

## 2018-01-03 MED ORDER — HYDRALAZINE HCL 25 MG PO TABS: 25.0000 mg | ORAL_TABLET | Freq: Three times a day (TID) | ORAL | 2 refills | Status: DC

## 2018-01-03 MED ORDER — AMIODARONE HCL 200 MG PO TABS: 200.0000 mg | ORAL_TABLET | Freq: Two times a day (BID) | ORAL | 2 refills | Status: DC

## 2018-01-03 NOTE — Progress Notes (Signed)
1352 d/c instructions, paperwork, and hard Rxs given to patient and patient's sister. Patient has Retail banker on person and in place. Denied having any questions at this time regarding his Retail banker. IV catheter removed from patient's RIGHT FA, intact, w/no s/s of infection/infiltration noted at this time. Monitor wires removed from patient and left in room to be cleaned. Patient gathered all of his belongings and confirmed he had everything he came with. Sister at bedside to transport patient home.

## 2018-01-03 NOTE — Care Management Important Message (Signed)
Important Message  Patient Details  Name: [name] MRN: [id number] Date of Birth: 02-18-1966   Medicare Important Message Given:  Yes    Renie Ora 01/03/2018, 2:21 PM

## 2018-01-03 NOTE — Discharge Summary (Signed)
Physician Discharge Summary  [name] ZOX:[id number] DOB: Aug 04, 1966 DOA: 12/30/2017  PCP: Joette Catching, MD  Admit date: 12/30/2017 Discharge date: 01/03/2018  Time spent: 45 minutes  Recommendations for Outpatient Follow-up:  -To be discharged home today. -Has appointment with cardiology in 10 days and at the end of the month with EP cardiology for AICD placement. -LifeVest will be arranged prior to discharge.  Discharge Diagnoses:  Principal Problem:   Acute systolic (congestive) heart failure (HCC) Active Problems:   Hyperlipemia   ANEMIA   Hypertension   Coronary atherosclerosis   Chest pain   CKD (chronic kidney disease), stage III (HCC)   Shortness of breath   Chronic combined systolic and diastolic CHF (congestive heart failure) (HCC)   SVT (supraventricular tachycardia) (HCC)   Hypertensive heart disease   Acute respiratory failure (HCC)   Hypokalemia   Malignant hypertension   Discharge Condition: Stable and improved  Filed Weights   01/01/18 0500 01/02/18 0500 01/03/18 0458  Weight: 99.1 kg (218 lb 7.6 oz) 101 kg (222 lb 10.6 oz) 101.1 kg (222 lb 14.2 oz)    History of present illness:   As per Dr. Laural Benes 4/29:  Kenai Fluegel is a 52 y.o. male with well known and well-documented ischemic cardiomyopathy with a reported ejection fraction between 10-20% and difficult to control hypertension presented to the emergency department with progressive lower extremity edema, chest pain and chest tightness associated with shortness of breath.  The patient has had multiple previous myocardial infarctions.  The patient has long-standing chronic kidney disease.  The patient reports that for the past several weeks he has been noticing increasing edema in the feet and legs.  He continues to take his Lasix and report he has not been missing doses.  He was last hospitalized for congestive heart failure several years ago but reports that he has been fairly stable for  the last several years.  He does see cardiology in [place].  He reports that he has a central chest pain that has been nonradiating.  He has some shortness of breath and occasional wheezing.  He has some headache as well.  The patient reports that his symptoms are exacerbated by walking.  He has pain in the legs and feet exacerbated by walking.  He denies abdominal pain at this time.  According to cardiology records he had been referred for ICD placement but the patient ultimately decided against that at the time reportedly.  ED course: The patient was seen in the emergency department and noted to be short of breath with an elevated troponin of 0.10.  He was severely volume overloaded.  He was given a dose of IV Lasix in the ED.  He was put on Nitropaste and started on IV heparin.  His blood pressure was elevated.  His chest x-ray revealed market pulmonary edema.  The patient demonstrated worsening kidney disease.  His clinical exam exhibited pitting edema bilateral lower extremities.  He was treated, placed on supplemental oxygen and admission was requested for further evaluation and management.    Hospital Course:   Acute on chronic systolic CHF -Ejection fraction of 10 to 20%. -Has diuresed a total of 7 L since admission, and now appears euvolemic. -Has been transitioned to torsemide 20 mg twice daily for diuresis. -Unable to add ACE inhibitors, ARB use or ARNI due to advanced chronic kidney disease.  Ventricular tachycardia -No recurrences, however still has paroxysmal nonsustained VT and frequent PVCs. -Is on Coreg 25 mg twice daily  and amiodarone 200 mg twice daily, electrolytes are normal. -He had previously refused an AICD however now agrees.  Per cardiology next appointment with EP is later this month. -In the meantime patient will be set up with a LifeVest.  Chronic kidney disease stage IV -Creatinine remains stable at baseline of around 2.9-3.0.  Hyperlipidemia -Continue  Lipitor 80 mg daily  Hypertension with hypertensive heart disease -Fair management on current medications.    Procedures:  As above   Consultations:  Cardiology  Discharge Instructions  Discharge Instructions    Diet - low sodium heart healthy   Complete by:  As directed    Increase activity slowly   Complete by:  As directed      Allergies as of 01/03/2018   No Known Allergies     Medication List    STOP taking these medications   HYDROcodone-acetaminophen 5-325 MG tablet Commonly known as:  NORCO/VICODIN     TAKE these medications   amiodarone 200 MG tablet Commonly known as:  PACERONE Take 1 tablet (200 mg total) by mouth 2 (two) times daily.   aspirin EC 81 MG tablet Take 1 tablet (81 mg total) by mouth daily.   atorvastatin 80 MG tablet Commonly known as:  LIPITOR Take 80 mg by mouth daily.   budesonide-formoterol 160-4.5 MCG/ACT inhaler Commonly known as:  SYMBICORT Inhale 2 puffs into the lungs 2 (two) times daily.   calcitRIOL 0.25 MCG capsule Commonly known as:  ROCALTROL Take 0.25 mcg by mouth 3 (three) times a week. On Monday, Wednesday, and Friday.   carvedilol 25 MG tablet Commonly known as:  COREG Take 1 tablet (25 mg total) by mouth 2 (two) times daily with a meal. What changed:  how much to take   cholecalciferol 1000 units tablet Commonly known as:  VITAMIN D Take 1,000 Units by mouth daily.   hydrALAZINE 25 MG tablet Commonly known as:  APRESOLINE Take 1 tablet (25 mg total) by mouth every 8 (eight) hours. What changed:    medication strength  how much to take  when to take this   isosorbide mononitrate 120 MG 24 hr tablet Commonly known as:  IMDUR TAKE 1 TABLET (120 MG TOTAL) BY MOUTH DAILY.   magnesium oxide 400 (241.3 Mg) MG tablet Commonly known as:  MAG-OX Take 1 tablet (400 mg total) by mouth 2 (two) times daily for 7 days.   nitroGLYCERIN 0.4 MG SL tablet Commonly known as:  NITROSTAT Place 1 tablet (0.4 mg  total) under the tongue every 5 (five) minutes as needed for chest pain. KEEP OV.   potassium chloride SA 20 MEQ tablet Commonly known as:  K-DUR,KLOR-CON Take 2 tablets (40 mEq total) by mouth 2 (two) times daily. What changed:  See the new instructions.   ranolazine 500 MG 12 hr tablet Commonly known as:  RANEXA TAKE ONE TABLET BY MOUTH 2 TIMES A DAY **CALL OFFICE TO MAKE APPOINTMENT**   torsemide 20 MG tablet Commonly known as:  DEMADEX Take 20 mg by mouth 2 (two) times daily.   ULORIC 80 MG Tabs Generic drug:  Febuxostat Take 1 tablet by mouth daily.      No Known Allergies Follow-up Information    Marinus Maw, MD Follow up on 01/29/2018.   Specialty:  Cardiology Why:  Follow-up with Dr. Ladona Ridgel to further discuss ICD Placement on 01/29/2018 at 2:30PM. THIS IS AT THE [address] LOCATION.  Contact information: [address] [place] [address] [phone]  Jodelle Gross, NP Follow up on 01/14/2018.   Specialties:  Nurse Practitioner, Radiology, Cardiology Why:  Hospital Follow-Up on 01/14/2018 at 10:30AM. THIS IS AT [facility].  Contact information: [address] [place] [address] [phone]            The results of significant diagnostics from this hospitalization (including imaging, microbiology, ancillary and laboratory) are listed below for reference.    Significant Diagnostic Studies: Dg Chest 2 View  Result Date: 12/30/2017 CLINICAL DATA:  Chest pain, shortness of breath EXAM: CHEST - 2 VIEW COMPARISON:  06/13/2015 FINDINGS: Cardiomegaly with vascular congestion. Mild interstitial prominence, likely interstitial edema. No confluent opacities or effusions. No acute bony abnormality. IMPRESSION: Cardiomegaly with vascular congestion and interstitial prominence, likely edema/CHF. Electronically Signed   By: Charlett Nose M.D.   On: 12/30/2017 07:44   Dg Chest Port 1 View  Result Date:  01/01/2018 CLINICAL DATA:  52 year old male with pneumonia, chest pain and shortness of breath EXAM: PORTABLE CHEST 1 VIEW COMPARISON:  Prior chest x-ray 12/31/2017 FINDINGS: Similar appearance of the cardiopericardial silhouette with marked cardiomegaly. Mediastinal contours remain within normal limits. Persistent patchy airspace opacity in the right mid lung which is slightly progressed compared to recent prior imaging. Mild vascular congestion but no overt edema. No pneumothorax or large effusion. Osseous structures are intact and unremarkable. IMPRESSION: 1. Similar to slightly increased patchy airspace opacity in the right mid lung consistent with the clinical history of pneumonia. 2. Stable marked cardiomegaly. 3. Pulmonary vascular congestion without overt edema. Electronically Signed   By: Malachy Moan M.D.   On: 01/01/2018 09:45   Dg Chest Port 1 View  Result Date: 12/31/2017 CLINICAL DATA:  52 year old male with chest pain and shortness of breath. Suspected heart failure. EXAM: PORTABLE CHEST 1 VIEW COMPARISON:  12/30/2017 and earlier. FINDINGS: Portable AP upright view at 0759 hours. Stable cardiomegaly and mediastinal contours. Increased asymmetric patchy and indistinct opacity in the right lower lung since yesterday. Multilobar involvement sparing the right lung apex. No pneumothorax. Pulmonary vascularity appears decreased elsewhere. No pleural effusion is evident. No other confluent opacity. Visualized tracheal air column is within normal limits. IMPRESSION: 1. New asymmetric, indistinct right mid and lower lung opacity since yesterday. Perhaps this is asymmetric pulmonary edema, but multilobar right lung infection is not excluded. 2. Decreased pulmonary vascularity elsewhere. Stable moderate to severe cardiomegaly. Electronically Signed   By: Odessa Fleming M.D.   On: 12/31/2017 08:33    Microbiology: Recent Results (from the past 240 hour(s))  MRSA PCR Screening     Status: None   Collection  Time: 12/30/17  1:52 PM  Result Value Ref Range Status   MRSA by PCR NEGATIVE NEGATIVE Final    Comment:        The GeneXpert MRSA Assay (FDA approved for NASAL specimens only), is one component of a comprehensive MRSA colonization surveillance program. It is not intended to diagnose MRSA infection nor to guide or monitor treatment for MRSA infections. Performed at [facility], [address], [place], [place] [address]      Labs: Basic Metabolic Panel: Recent Labs  Lab 12/30/17 0654 12/31/17 0433 01/01/18 0423 01/02/18 0431 01/03/18 0416  NA 139 139 139 135  134* 136  K 3.2* 4.3 4.2 5.1  5.0 4.6  CL 98* 99* 99* 97*  96* 98*  CO2 29 27 28 26  27 28   GLUCOSE 194* 158* 137* 182*  182* 160*  BUN 54* 43* 47*  49*  49* 50*  CREATININE 3.78* 2.85* 3.00* 3.03*  2.96* 3.01*  CALCIUM 8.4* 8.6* 8.4* 8.5*  8.5* 8.5*  MG  --  1.4* 2.0 2.0 1.8  PHOS  --   --   --  3.5  --    Liver Function Tests: Recent Labs  Lab 12/30/17 0654 01/02/18 0431  AST 21 14*  ALT 14* 12*  ALKPHOS 64 65  BILITOT 1.1 1.1  PROT 7.1 7.0  ALBUMIN 3.2* 3.1*  3.1*   No results for input(s): LIPASE, AMYLASE in the last 168 hours. No results for input(s): AMMONIA in the last 168 hours. CBC: Recent Labs  Lab 12/30/17 0656 12/31/17 0433 01/01/18 0423 01/02/18 0431 01/03/18 0416  WBC 7.6 8.8 7.1 7.0 6.3  NEUTROABS 5.1 6.6 5.2 5.1 4.1  HGB 11.1* 11.7* 10.8* 11.4* 10.6*  HCT 36.6* 39.4 35.0* 37.7* 34.7*  MCV 84.3 84.9 85.6 86.1 84.2  PLT 262 272 221 228 239   Cardiac Enzymes: Recent Labs  Lab 12/30/17 0959 12/30/17 1427 12/30/17 2112  TROPONINI 0.11* 0.11* 0.08*   BNP: BNP (last 3 results) Recent Labs    01/01/18 0423 01/02/18 0431 01/03/18 0416  BNP 1,951.0* 1,441.0* 2,180.0*    ProBNP (last 3 results) No results for input(s): PROBNP in the last 8760 hours.  CBG: No results for input(s): GLUCAP in the last 168 hours.     Signed:  Chaya Jan  [facility] Pager: [phone] 01/03/2018, 11:49 AM

## 2018-01-03 NOTE — Progress Notes (Addendum)
Progress Note  Patient Name: [name] Date of Encounter: 01/03/2018  Primary Cardiologist: Rollene Rotunda, MD   Subjective   Doing well. Ready to go home. Denies chest pain, palpitations, and shortness of breath. We spoke about the LifeVest I ordered and the rationale behind it.  Inpatient Medications    Scheduled Meds: . amiodarone  200 mg Oral BID  . aspirin EC  81 mg Oral Daily  . atorvastatin  80 mg Oral Daily  . carvedilol  25 mg Oral BID WC  . enoxaparin (LOVENOX) injection  50 mg Subcutaneous Q24H  . hydrALAZINE  25 mg Oral Q8H  . isosorbide mononitrate  120 mg Oral Daily  . magnesium oxide  400 mg Oral BID  . mometasone-formoterol  2 puff Inhalation BID  . potassium chloride  40 mEq Oral BID  . ranolazine  500 mg Oral BID  . sodium chloride flush  3 mL Intravenous Q12H  . torsemide  20 mg Oral BID   Continuous Infusions: . sodium chloride     PRN Meds: sodium chloride, acetaminophen, hydrALAZINE, labetalol, morphine injection, sodium chloride flush   Vital Signs    Vitals:   01/03/18 0600 01/03/18 0700 01/03/18 0747 01/03/18 0749  BP: (!) 129/99 134/89 (!) 131/97   Pulse: (!) 53 (!) 52 (!) 58 61  Resp:      Temp:    97.7 F (36.5 C)  TempSrc:    Oral  SpO2: 92% 98% 97% 94%  Weight:      Height:        Intake/Output Summary (Last 24 hours) at 01/03/2018 0857 Last data filed at 01/03/2018 0400 Gross per 24 hour  Intake 240 ml  Output 2300 ml  Net -2060 ml   Filed Weights   01/01/18 0500 01/02/18 0500 01/03/18 0458  Weight: 218 lb 7.6 oz (99.1 kg) 222 lb 10.6 oz (101 kg) 222 lb 14.2 oz (101.1 kg)    Telemetry    Sinus rhythm with frequent PVC's and paroxysms of NSVT - Personally Reviewed  ECG    n/a - Personally Reviewed  Physical Exam   GEN: No acute distress.   Neck: No JVD Cardiac: RRR, no murmurs, rubs, or gallops.  Respiratory: Clear to auscultation bilaterally. GI: Soft, nontender, non-distended  MS: No edema; No  deformity. Neuro:  Nonfocal  Psych: Normal affect   Labs    Chemistry Recent Labs  Lab 12/30/17 0654  01/01/18 0423 01/02/18 0431 01/03/18 0416  NA 139   < > 139 135  134* 136  K 3.2*   < > 4.2 5.1  5.0 4.6  CL 98*   < > 99* 97*  96* 98*  CO2 29   < > 28 26  27 28   GLUCOSE 194*   < > 137* 182*  182* 160*  BUN 54*   < > 47* 49*  49* 50*  CREATININE 3.78*   < > 3.00* 3.03*  2.96* 3.01*  CALCIUM 8.4*   < > 8.4* 8.5*  8.5* 8.5*  PROT 7.1  --   --  7.0  --   ALBUMIN 3.2*  --   --  3.1*  3.1*  --   AST 21  --   --  14*  --   ALT 14*  --   --  12*  --   ALKPHOS 64  --   --  65  --   BILITOT 1.1  --   --  1.1  --  GFRNONAA 17*   < > 23* 22*  23* 23*  GFRAA 20*   < > 26* 26*  27* 26*  ANIONGAP 12   < > 12 12  11 10    < > = values in this interval not displayed.     Hematology Recent Labs  Lab 01/01/18 0423 01/02/18 0431 01/03/18 0416  WBC 7.1 7.0 6.3  RBC 4.09* 4.38 4.12*  HGB 10.8* 11.4* 10.6*  HCT 35.0* 37.7* 34.7*  MCV 85.6 86.1 84.2  MCH 26.4 26.0 25.7*  MCHC 30.9 30.2 30.5  RDW 16.5* 16.4* 16.2*  PLT 221 228 239    Cardiac Enzymes Recent Labs  Lab 12/30/17 0959 12/30/17 1427 12/30/17 2112  TROPONINI 0.11* 0.11* 0.08*    Recent Labs  Lab 12/30/17 0657  TROPIPOC 0.10*     BNP Recent Labs  Lab 01/01/18 0423 01/02/18 0431 01/03/18 0416  BNP 1,951.0* 1,441.0* 2,180.0*     DDimer No results for input(s): DDIMER in the last 168 hours.   Radiology    No results found.  Cardiac Studies   Echocardiogram (12/30/17):  Study Conclusions  - Procedure narrative: Transthoracic echocardiography. Image quality was adequate. Intravenous contrast (Definity) was administered. - Left ventricle: The cavity size was moderately dilated. Systolic function was severely reduced. The estimated ejection fraction was 15%. Diffuse hypokinesis. Doppler parameters are consistent with restrictive physiology, indicative of decreased  left ventricular diastolic compliance and/or increased left atrial pressure. Doppler parameters are consistent with high ventricular filling pressure. Mild concentric and moderate focal basal septal hypertrophy. - Aortic valve: There was mild regurgitation. - Left atrium: The atrium was severely dilated. - Right ventricle: The cavity size was moderately dilated. Systolic function was severely reduced. - Right atrium: The atrium was severely dilated. - Atrial septum: No defect or patent foramen ovale was identified. - Tricuspid valve: There was moderate regurgitation. - Pulmonary arteries: Systolic pressure was severely increased. PA peak pressure: 76 mm Hg (S). - Inferior vena cava: The vessel was dilated. The respirophasic diameter changes were blunted (<50%), consistent with elevated central venous pressure. Estimated CVP 15 mmHg.  Patient Profile     52 y.o. male with cardiomyopathy, CAD, and hypertension admitted for acute on chronic combined systolic and diastolic heart failure with VT/NSVT.  Assessment & Plan    1. Acute on chronic combined systolic and diastolic heart failure: Euvolemic  on torsemide 20 mg bid. 1860 cc output in last 24 hrs.  Diastolic BPmildly elevated this morning. LVEF remains severely reduced by echo noted above. Continue carvediloland Imdur. I added hydralazine 25 mg tid which I would recommend continuing at present dose for now. This can be increased in outpatient setting. I am unable to add ACE inhibitors, angiotensin receptor blockers, or angiotensin receptor-neprilysin inhibitors due to advanced chronic kidney disease.  2. Ventricular tachycardia:No recurrences but still has paroxysmal NSVT and frequent PVC's.On Coreg 25 mg bid and amiodarone 200 mg bid. I would recommend continuing amio 200 mg bid for one week and then reducing to 200 mg daily. Mg is low normal at 1.8, previously1.4 on admission. He previously refused AICD but  now agrees to have one.  The earliest available appointment with EP is later this month. I have arranged for him to be provided with a LifeVest until he is able to get an AICD. We spoke at length about the rationale for this. Rep has been contacted and is expected to fit him with one today prior to discharge.  3. CAD: Symptomatically stable.  Troponins peaked at 0.11, eventually trended down to 0.08. Likely consistent with demand ischemia in the context of decompensated heart failure and CKD stage IV.ContinueASA, Lipitor, Coreg, Imdur,and Ranexa.   4. Hypertensive heart disease: Diastolic BPmildly elevated this morning. Continue carvediloland Imdur. I added hydralazine 25 mg tid yesterday which I would recommend continuing at the present dose for now. This can be increased in outpatient setting.  5. CKD stage IV: BUN and creatinine50and 3.01 respectively. Stable.  6. Hypomagnesemia:Low normal, 1.8.   Disposition: Discharge to home today.    For questions or updates, please contact [facility] Please consult [email or web address] for contact info under Cardiology/STEMI.      Signed, Prentice Docker, MD  01/03/2018, 8:57 AM

## 2018-01-13 NOTE — Progress Notes (Signed)
Cardiology Office Note   Date:  01/14/2018   ID:  [name], DOB 1966-08-28, MRN [id number]  PCP:  Joette Catching, MD  Cardiologist:  Dr. Antoine Poche   Chief Complaint  Patient presents with  . Hospitalization Follow-up    first of May, denies chest pains, SOB, swelling in hands/feet      History of Present Illness: [name] is a 52 y.o. 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 He is in agreement to have AICD. He is to follow up with EP in May of 2019. Other history includes long standing CKD Stage IV with baseline creatinine 2.9-3.0.   On discharge he was transitioned to torsemide 20 mg BID, after diureses of 7 liters during hospitalization. He remains on coreg 25 mg BID and amiodarone 200 mg BID in the setting of PSVT.   He is here today without complaints. He continues to wear Life Vest. He is medically compliant and is doing his best to avoid salted foods. He has not been weighing himself daily as his scale needs batteries. He denies DOE, rapid HR, dizziness or edema. He complained of weight gain and fluid retention last week and was increased on torsemide to TID for 3 days with good response, and has now returned to torsemide 20 mg BID per usual dose.   Past Medical History:  Diagnosis Date  . Anemia    a. 2008 in setting of profound epistaxis.  Marland Kitchen CAD (coronary artery disease)    a. NSTEMI 2008: in setting of profound epistaxis/anemia, cath showing severe diffuse RCA dz with L-R collaterals, nonobst dz in left system, managed medically. b. Type II NSTEMI 10/2008. c. Last cath 2012 - for med rx. d. 05/2013: elevated troponin ? due to ARF.  e. Ranexa added 03/2012.  . Cardiomyopathy (HCC)    a. Mixed ICM/NICM - Prior EF 25% back to 2008. b. Improved to 55% in 2012. c. Subsequent echoes: 45% in 11/2010, 15% by  echo 05/2013.and 2016  . CHF (congestive heart failure) (HCC)   . Chronic kidney disease    a. Average creatine of about 2.3  . Epistaxis    a. 2008, 2009 a/w significant anemia.  Marland Kitchen History of pneumonia 08/2012, 05/2013  . Hyperlipidemia   . Hypertension    a. Difficult to control. b. Renal duplex neg for RAS 11/2012.  Marland Kitchen SVT (supraventricular tachycardia) (HCC)    a. 05/2013 tx with 6mg  adenosine in ER.    Past Surgical History:  Procedure Laterality Date  . CARDIAC CATHETERIZATION  2012   total RCA occlusion with left-to-right collaterals supplying the distal RCA branches, mild diffuse nonobstructive disease of LAD and LCx. Medically managed     Current Outpatient Medications  Medication Sig Dispense Refill  . aspirin 81 MG tablet Take 1 tablet (81 mg total) by mouth daily.    . budesonide-formoterol (SYMBICORT) 160-4.5 MCG/ACT inhaler Inhale 2 puffs into the lungs 2 (two) times daily.    . calcitRIOL (ROCALTROL) 0.25 MCG capsule Take 0.25 mcg by mouth 3 (three) times a week. On Monday, Wednesday, and Friday.    . carvedilol (COREG) 25 MG tablet Take 1 tablet (25 mg total) by mouth 2 (two) times daily with a meal. 60 tablet 2  . cholecalciferol (VITAMIN D) 1000 UNITS tablet Take 1,000 Units by mouth daily.    . Febuxostat (ULORIC) 80 MG TABS  Take 1 tablet by mouth daily.    . hydrALAZINE (APRESOLINE) 25 MG tablet Take 1 tablet (25 mg total) by mouth every 8 (eight) hours. 90 tablet 2  . isosorbide mononitrate (IMDUR) 120 MG 24 hr tablet TAKE 1 TABLET (120 MG TOTAL) BY MOUTH DAILY. 30 tablet 6  . nitroGLYCERIN (NITROSTAT) 0.4 MG SL tablet Place 1 tablet (0.4 mg total) under the tongue every 5 (five) minutes as needed for chest pain. KEEP OV. 25 tablet 0  . potassium chloride SA (K-DUR,KLOR-CON) 20 MEQ tablet Take 2 tablets (40 mEq total) by mouth 2 (two) times daily. 120 tablet 2  . ranolazine (RANEXA) 500 MG 12 hr tablet TAKE ONE TABLET BY MOUTH 2 TIMES A DAY **CALL OFFICE TO MAKE  APPOINTMENT** 60 tablet 2  . torsemide (DEMADEX) 20 MG tablet Take 20 mg by mouth 2 (two) times daily.    Marland Kitchen amiodarone (PACERONE) 200 MG tablet Take 1 tablet (200 mg total) by mouth daily. 90 tablet 3  . atorvastatin (LIPITOR) 80 MG tablet Take 80 mg by mouth daily.     No current facility-administered medications for this visit.     Allergies:   Patient has no known allergies.    Social History:  The patient  reports that he quit smoking about 11 years ago. His smoking use included cigarettes. He has a 10.00 pack-year smoking history. He has never used smokeless tobacco. He reports that he has current or past drug history. He reports that he does not drink alcohol.   Family History:  The patient's family history includes Diabetes in his mother; Heart attack in his father; Heart disease in his mother; Hypertension in his mother.    ROS: All other systems are reviewed and negative. Unless otherwise mentioned in H&P    PHYSICAL EXAM: VS:  BP 102/68 (BP Location: Left Arm)   Pulse (!) 58   Ht 5\' 10"  (1.778 m)   Wt 206 lb 12.8 oz (93.8 kg)   BMI 29.67 kg/m  , BMI Body mass index is 29.67 kg/m. GEN: Well nourished, well developed, in no acute distress  HEENT: normal  Neck: no JVD, carotid bruits, or masses Cardiac: RRR, distant heart sounds; no murmurs, rubs, or gallops,non-pitting  edema  Respiratory:  Clear to auscultation bilaterally, normal work of breathing GI: soft, nontender, nondistended, + BS MS: no deformity or atrophy  Skin: warm and dry, no rash Neuro:  Strength and sensation are intact Psych: euthymic mood, full affect   EKG:  Sinus bradycardia with PVC's infero, anterolateral infarct rate of 58 bpm.  Recent Labs: 01/02/2018: ALT 12; TSH 3.314 01/03/2018: B Natriuretic Peptide 2,180.0; BUN 50; Creatinine, Ser 3.01; Hemoglobin 10.6; Magnesium 1.8; Platelets 239; Potassium 4.6; Sodium 136    Lipid Panel    Component Value Date/Time   CHOL  02/22/2010 0520    121         ATP III CLASSIFICATION:  <200     mg/dL   Desirable  782-956  mg/dL   Borderline High  >=213    mg/dL   High          TRIG 60 02/22/2010 0520   HDL 31 (L) 02/22/2010 0520   CHOLHDL 3.9 02/22/2010 0520   VLDL 12 02/22/2010 0520   LDLCALC  02/22/2010 0520    78        Total Cholesterol/HDL:CHD Risk Coronary Heart Disease Risk Table  Men   Women  1/2 Average Risk   3.4   3.3  Average Risk       5.0   4.4  2 X Average Risk   9.6   7.1  3 X Average Risk  23.4   11.0        Use the calculated Patient Ratio above and the CHD Risk Table to determine the patient's CHD Risk.        ATP III CLASSIFICATION (LDL):  <100     mg/dL   Optimal  161-096  mg/dL   Near or Above                    Optimal  130-159  mg/dL   Borderline  045-409  mg/dL   High  >811     mg/dL   Very High      Wt Readings from Last 3 Encounters:  01/14/18 206 lb 12.8 oz (93.8 kg)  01/03/18 222 lb 14.2 oz (101.1 kg)  08/07/17 210 lb (95.3 kg)      Other studies Reviewed: Echocardiogram 2018/01/22 Procedure narrative: Transthoracic echocardiography. Image   quality was adequate. Intravenous contrast (Definity) was   administered. - Left ventricle: The cavity size was moderately dilated. Systolic   function was severely reduced. The estimated ejection fraction   was 15%. Diffuse hypokinesis. Doppler parameters are consistent   with restrictive physiology, indicative of decreased left   ventricular diastolic compliance and/or increased left atrial   pressure. Doppler parameters are consistent with high ventricular   filling pressure. Mild concentric and moderate focal basal septal   hypertrophy. - Aortic valve: There was mild regurgitation. - Left atrium: The atrium was severely dilated. - Right ventricle: The cavity size was moderately dilated. Systolic   function was severely reduced. - Right atrium: The atrium was severely dilated. - Atrial septum: No defect or patent foramen  ovale was identified. - Tricuspid valve: There was moderate regurgitation. - Pulmonary arteries: Systolic pressure was severely increased. PA   peak pressure: 76 mm Hg (S). - Inferior vena cava: The vessel was dilated. The respirophasic   diameter changes were blunted (< 50%), consistent with elevated   central venous pressure. Estimated CVP 15 mmHg.  Cardiac Catheterization 01/16/2011  ASSESSMENT:  1. Severe single-vessel disease involving the right coronary artery      with diffuse disease in the proximal portion of the vessel, an      occluded side branch, and what appears to be an occluded main      portion of the right coronary artery which is supplied from left-to-      right collaterals.  2. Moderate left circumflex disease.  3. Minimal left main and left anterior descending artery disease.   ASSESSMENT AND PLAN:  1. PSVT: Currently on amiodarone 200 mg BID Will decrease this to 200 mg daily.   2. Chronic Systolic CHF: Most recent EF of 15%. Wearing Life Vest. To see Dr. Ladona Ridgel in 5.29/2019 for discussion and planning of ICD. Continue coreg 25 mg BID,   3. Hypertension: BP is low normal for his LV fx. Continue hydralazine, and isosorbide. Not a candidate for Entresto due to  CKD, last creatinine 3.01.   4. CAD: Last cardiac cath in 2012 in the setting of NSTEMI demonstrated severe single vessel disease in the proximal RCA, occluded side branch and occluded main portion of the RCA with left right collaterals. Moderate left Cx disease and minimal LM and LAD disease. Medically  managed with secondary prevention. Continue ASA and statin therapy.   5. Hypercholesterolemia: Continue atorvastatin. Will need ongoing lab surveillance.  Most recent labs were in 2011. TC 121, LDL 70 .   Current medicines are reviewed at length with the patient today.    Labs/ tests ordered today include: None  Bettey Mare. Liborio Nixon, ANP, AACC   01/14/2018 1:25 PM     [facility] [address], [place], [place] [address] Phone: [phone]; Fax: [phone]

## 2018-01-14 ENCOUNTER — Encounter: Payer: Self-pay | Admitting: Adult Health

## 2018-01-14 ENCOUNTER — Ambulatory Visit (INDEPENDENT_AMBULATORY_CARE_PROVIDER_SITE_OTHER): Payer: Medicare Other | Admitting: Adult Health

## 2018-01-14 VITALS — BP 102/68 | HR 58 | Ht 70.0 in | Wt 206.8 lb

## 2018-01-14 DIAGNOSIS — I2510 Atherosclerotic heart disease of native coronary artery without angina pectoris: Secondary | ICD-10-CM | POA: Diagnosis not present

## 2018-01-14 DIAGNOSIS — I5042 Chronic combined systolic (congestive) and diastolic (congestive) heart failure: Secondary | ICD-10-CM | POA: Diagnosis not present

## 2018-01-14 DIAGNOSIS — I471 Supraventricular tachycardia: Secondary | ICD-10-CM | POA: Diagnosis not present

## 2018-01-14 DIAGNOSIS — I10 Essential (primary) hypertension: Secondary | ICD-10-CM

## 2018-01-14 NOTE — Patient Instructions (Signed)
Weigh daily and keep a record of weight    Decrease Amiodarone to 200 mg daily    Your physician recommends that you schedule a follow-up appointment in 2 months with Dr.Hochrein at [facility] office.

## 2018-01-20 ENCOUNTER — Emergency Department (HOSPITAL_COMMUNITY)
Admission: EM | Admit: 2018-01-20 | Discharge: 2018-01-20 | Disposition: A | Discharge status: Home / Self Care | Payer: Medicare Other | Attending: Emergency Medicine | Admitting: Emergency Medicine

## 2018-01-20 ENCOUNTER — Encounter (HOSPITAL_COMMUNITY): Payer: Self-pay | Admitting: *Deleted

## 2018-01-20 ENCOUNTER — Emergency Department (HOSPITAL_COMMUNITY): Payer: Medicare Other

## 2018-01-20 ENCOUNTER — Other Ambulatory Visit: Payer: Self-pay

## 2018-01-20 DIAGNOSIS — Z79899 Other long term (current) drug therapy: Secondary | ICD-10-CM | POA: Insufficient documentation

## 2018-01-20 DIAGNOSIS — I5042 Chronic combined systolic (congestive) and diastolic (congestive) heart failure: Secondary | ICD-10-CM | POA: Diagnosis not present

## 2018-01-20 DIAGNOSIS — R0789 Other chest pain: Secondary | ICD-10-CM

## 2018-01-20 DIAGNOSIS — I130 Hypertensive heart and chronic kidney disease with heart failure and stage 1 through stage 4 chronic kidney disease, or unspecified chronic kidney disease: Secondary | ICD-10-CM | POA: Diagnosis not present

## 2018-01-20 DIAGNOSIS — I2510 Atherosclerotic heart disease of native coronary artery without angina pectoris: Secondary | ICD-10-CM | POA: Diagnosis not present

## 2018-01-20 DIAGNOSIS — Z7982 Long term (current) use of aspirin: Secondary | ICD-10-CM | POA: Insufficient documentation

## 2018-01-20 DIAGNOSIS — Z87891 Personal history of nicotine dependence: Secondary | ICD-10-CM | POA: Diagnosis not present

## 2018-01-20 DIAGNOSIS — R079 Chest pain, unspecified: Secondary | ICD-10-CM | POA: Insufficient documentation

## 2018-01-20 DIAGNOSIS — N183 Chronic kidney disease, stage 3 (moderate): Secondary | ICD-10-CM | POA: Insufficient documentation

## 2018-01-20 LAB — BASIC METABOLIC PANEL
Anion gap: 10 (ref 5–15)
BUN: 64 mg/dL — ABNORMAL HIGH (ref 6–20)
CALCIUM: 8.9 mg/dL (ref 8.9–10.3)
CHLORIDE: 96 mmol/L — AB (ref 101–111)
CO2: 29 mmol/L (ref 22–32)
Creatinine, Ser: 3.51 mg/dL — ABNORMAL HIGH (ref 0.61–1.24)
GFR calc non Af Amer: 19 mL/min — ABNORMAL LOW (ref >60)
GFR, EST AFRICAN AMERICAN: 22 mL/min — AB (ref >60)
Glucose, Bld: 142 mg/dL — ABNORMAL HIGH (ref 65–99)
Potassium: 4 mmol/L (ref 3.5–5.1)
Sodium: 135 mmol/L (ref 135–145)

## 2018-01-20 LAB — CBC WITH DIFFERENTIAL/PLATELET
BASOS ABS: 0 10*3/uL (ref 0.0–0.1)
BASOS PCT: 0 %
Eosinophils Absolute: 0.2 10*3/uL (ref 0.0–0.7)
Eosinophils Relative: 2 %
HEMATOCRIT: 40.7 % (ref 39.0–52.0)
HEMOGLOBIN: 12.4 g/dL — AB (ref 13.0–17.0)
Lymphocytes Relative: 19 %
Lymphs Abs: 1.4 10*3/uL (ref 0.7–4.0)
MCH: 25.6 pg — ABNORMAL LOW (ref 26.0–34.0)
MCHC: 30.5 g/dL (ref 30.0–36.0)
MCV: 84.1 fL (ref 78.0–100.0)
Monocytes Absolute: 0.4 10*3/uL (ref 0.1–1.0)
Monocytes Relative: 6 %
NEUTROS ABS: 5.3 10*3/uL (ref 1.7–7.7)
NEUTROS PCT: 73 %
Platelets: 303 10*3/uL (ref 150–400)
RBC: 4.84 MIL/uL (ref 4.22–5.81)
RDW: 16.1 % — ABNORMAL HIGH (ref 11.5–15.5)
WBC: 7.3 10*3/uL (ref 4.0–10.5)

## 2018-01-20 LAB — I-STAT TROPONIN, ED: TROPONIN I, POC: 0.87 ng/mL — AB (ref 0.00–0.08)

## 2018-01-20 MED ORDER — HYDRALAZINE HCL 20 MG/ML IJ SOLN
10.0000 mg | Freq: Once | INTRAMUSCULAR | Status: AC
  Administered 2018-01-20: 10 mg via INTRAVENOUS
  Filled 2018-01-20: qty 1

## 2018-01-20 NOTE — ED Notes (Signed)
Patient states he wants to leave AMA. Dr. Estell Harpin informed.

## 2018-01-20 NOTE — Discharge Instructions (Addendum)
Increase your blood pressure medicine called the hydralazine tablet so you will be taking 2 tablets 3 times a day.  Follow-up with the cardiologist as planned

## 2018-01-20 NOTE — ED Provider Notes (Signed)
[facility] EMERGENCY DEPARTMENT Provider Note   CSN: [id number] Arrival date & time: 01/20/18  1107     History   Chief Complaint Chief Complaint  Patient presents with  . Chest Pain    HPI [name] is a 52 y.o. male.  The patient states that he woke up this morning with chest discomfort and sweating.  It lasted about 2 hours.  When he was seen in the emergency department a little longer had any pain  The history is provided by the patient. No language interpreter was used.  Chest Pain   This is a new problem. The current episode started 12 to 24 hours ago. The problem occurs constantly. The problem has been resolved. The pain is associated with rest. The pain is present in the substernal region. The pain is at a severity of 8/10. The pain is moderate. The quality of the pain is described as dull. The pain does not radiate. Pertinent negatives include no abdominal pain, no back pain, no cough and no headaches.  Pertinent negatives for past medical history include no seizures.    Past Medical History:  Diagnosis Date  . Anemia    a. 2008 in setting of profound epistaxis.  Marland Kitchen CAD (coronary artery disease)    a. NSTEMI 2008: in setting of profound epistaxis/anemia, cath showing severe diffuse RCA dz with L-R collaterals, nonobst dz in left system, managed medically. b. Type II NSTEMI 10/2008. c. Last cath 2012 - for med rx. d. 05/2013: elevated troponin ? due to ARF.  e. Ranexa added 03/2012.  . Cardiomyopathy (HCC)    a. Mixed ICM/NICM - Prior EF 25% back to 2008. b. Improved to 55% in 2012. c. Subsequent echoes: 45% in 11/2010, 15% by echo 05/2013.and 2016  . CHF (congestive heart failure) (HCC)   . Chronic kidney disease    a. Average creatine of about 2.3  . Epistaxis    a. 2008, 2009 a/w significant anemia.  Marland Kitchen History of pneumonia 08/2012, 05/2013  . Hyperlipidemia   . Hypertension    a. Difficult to control. b. Renal duplex neg for RAS 11/2012.  Marland Kitchen SVT (supraventricular  tachycardia) (HCC)    a. 05/2013 tx with 6mg  adenosine in ER.    Patient Active Problem List   Diagnosis Date Noted  . Acute systolic (congestive) heart failure (HCC) 12/30/2017  . Hypokalemia 12/30/2017  . 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 Hypertensive heart disease 07/24/2013  . Acute respiratory failure (HCC) 07/24/2013  . Hypertensive urgency 07/24/2013  . Chronic combined systolic and diastolic CHF (congestive heart failure) (HCC) 05/11/2013  . SVT (supraventricular tachycardia) (HCC) 05/11/2013  . Shortness of breath 08/28/2012  . Sinus bradycardia 03/03/2012  . CKD (chronic kidney disease), stage III (HCC) 03/03/2012  . CAP (community acquired pneumonia) 11/16/2011  . Chest pain 11/16/2011  . Hyperlipemia 11/22/2008  . ANEMIA 11/22/2008  . Hypertension 11/22/2008  . Coronary atherosclerosis 11/22/2008    Past Surgical History:  Procedure Laterality Date  . CARDIAC CATHETERIZATION  2012   total RCA occlusion with left-to-right collaterals supplying the distal RCA branches, mild diffuse nonobstructive disease of LAD and LCx. Medically managed        Home Medications    Prior to Admission medications   Medication Sig Start Date End Date Taking? Authorizing Provider  amiodarone (PACERONE) 200 MG tablet Take 200 mg by mouth daily.   Yes [provider]  aspirin 81 MG tablet Take 1 tablet (81 mg total) by mouth daily. 11/21/16  Yes Rollene Rotunda, MD  atorvastatin (LIPITOR) 80 MG tablet Take 80 mg by mouth daily. 12/26/16 01/20/18 Yes [provider]  budesonide-formoterol (SYMBICORT) 160-4.5 MCG/ACT inhaler Inhale 2 puffs into the lungs 2 (two) times daily. 07/04/17  Yes [provider]  calcitRIOL (ROCALTROL) 0.25 MCG capsule Take 0.25 mcg by mouth 3 (three) times a week. On Monday, Wednesday, and Friday. 11/24/13  Yes [provider]  carvedilol (COREG) 25 MG tablet Take 1 tablet (25 mg total) by mouth 2 (two) times daily with a meal.  01/03/18  Yes Philip Aspen, Limmie Patricia, MD  cholecalciferol (VITAMIN D) 1000 UNITS tablet Take 1,000 Units by mouth daily.   Yes [provider]  Febuxostat (ULORIC) 80 MG TABS Take 1 tablet by mouth daily. 12/17/16  Yes [provider]  hydrALAZINE (APRESOLINE) 25 MG tablet Take 1 tablet (25 mg total) by mouth every 8 (eight) hours. 01/03/18  Yes Philip Aspen, Limmie Patricia, MD  isosorbide mononitrate (IMDUR) 120 MG 24 hr tablet TAKE 1 TABLET (120 MG TOTAL) BY MOUTH DAILY. 09/30/17  Yes Rollene Rotunda, MD  metolazone (ZAROXOLYN) 5 MG tablet Take 1 tablet by mouth daily. 01/09/18  Yes [provider]  potassium chloride SA (K-DUR,KLOR-CON) 20 MEQ tablet Take 2 tablets (40 mEq total) by mouth 2 (two) times daily. 01/03/18  Yes Philip Aspen, Limmie Patricia, MD  ranolazine (RANEXA) 500 MG 12 hr tablet TAKE ONE TABLET BY MOUTH 2 TIMES A DAY **CALL OFFICE TO MAKE APPOINTMENT** 11/01/17  Yes Rollene Rotunda, MD  nitroGLYCERIN (NITROSTAT) 0.4 MG SL tablet Place 1 tablet (0.4 mg total) under the tongue every 5 (five) minutes as needed for chest pain. KEEP OV. 07/11/16   Rollene Rotunda, MD    Family History Family History  Problem Relation Age of Onset  . Hypertension Mother        deceased  . Diabetes Mother        deceased  . Heart disease Mother        deceased  . Heart attack Father        at age 15, deceased    Social History Social History   Tobacco Use  . Smoking status: Former Smoker    Packs/day: 1.00    Years: 10.00    Pack years: 10.00    Types: Cigarettes    Last attempt to quit: 11/16/2006    Years since quitting: 11.1  . Smokeless tobacco: Never Used  Substance Use Topics  . Alcohol use: No    Alcohol/week: 0.0 oz  . Drug use: Yes    Comment: Previous marijuana use     Allergies   Patient has no known allergies.   Review of Systems Review of Systems  Constitutional: Negative for appetite change and fatigue.  HENT: Negative for congestion, ear  discharge and sinus pressure.   Eyes: Negative for discharge.  Respiratory: Negative for cough.   Cardiovascular: Positive for chest pain.  Gastrointestinal: Negative for abdominal pain and diarrhea.  Genitourinary: Negative for frequency and hematuria.  Musculoskeletal: Negative for back pain.  Skin: Negative for rash.  Neurological: Negative for seizures and headaches.  Psychiatric/Behavioral: Negative for hallucinations.     Physical Exam Updated Vital Signs BP (!) 152/121 (BP Location: Left Arm)   Pulse 73   Temp 97.7 F (36.5 C)   Resp 20   Ht 5\' 10"  (1.778 m)   Wt 99.8 kg (220 lb)   SpO2 99%   BMI 31.57 kg/m   Physical Exam  Constitutional: He is oriented to person, place, and time. He appears well-developed.  HENT:  Head: Normocephalic.  Eyes: Conjunctivae and EOM are normal. No scleral icterus.  Neck: Neck supple. No thyromegaly present.  Cardiovascular: Normal rate and regular rhythm. Exam reveals no gallop and no friction rub.  No murmur heard. Pulmonary/Chest: No stridor. He has no wheezes. He has no rales. He exhibits no tenderness.  Abdominal: He exhibits no distension. There is no tenderness. There is no rebound.  Musculoskeletal: Normal range of motion. He exhibits no edema.  Lymphadenopathy:    He has no cervical adenopathy.  Neurological: He is oriented to person, place, and time. He exhibits normal muscle tone. Coordination normal.  Skin: No rash noted. No erythema.  Psychiatric: He has a normal mood and affect. His behavior is normal.     ED Treatments / Results  Labs (all labs ordered are listed, but only abnormal results are displayed) Labs Reviewed  CBC WITH DIFFERENTIAL/PLATELET - Abnormal; Notable for the following components:      Result Value   Hemoglobin 12.4 (*)    MCH 25.6 (*)    RDW 16.1 (*)    All other components within normal limits  BASIC METABOLIC PANEL - Abnormal; Notable for the following components:   Chloride 96 (*)     Glucose, Bld 142 (*)    BUN 64 (*)    Creatinine, Ser 3.51 (*)    GFR calc non Af Amer 19 (*)    GFR calc Af Amer 22 (*)    All other components within normal limits  I-STAT TROPONIN, ED - Abnormal; Notable for the following components:   Troponin i, poc 0.87 (*)    All other components within normal limits    EKG EKG Interpretation  Date/Time:  Monday Jan 20 2018 11:28:43 EDT Ventricular Rate:  70 PR Interval:    QRS Duration: 140 QT Interval:  484 QTC Calculation: 523 R Axis:   -110 Text Interpretation:  Sinus rhythm Prolonged PR interval Probable left atrial enlargement Nonspecific IVCD with LAD Anterolateral infarct, old Confirmed by Bethann Berkshire [phone]) on 01/20/2018 12:20:22 PM   Radiology Dg Chest 2 View  Result Date: 01/20/2018 CLINICAL DATA:  Chest pain EXAM: CHEST - 2 VIEW COMPARISON:  Chest radiograph 01/01/2018 FINDINGS: Improved aeration of the right lung. No focal airspace consolidation. There is unchanged cardiomegaly with mild pulmonary edema. No pleural effusion or pneumothorax. IMPRESSION: Mild pulmonary edema with unchanged cardiomegaly. Electronically Signed   By: Deatra Robinson M.D.   On: 01/20/2018 14:01    Procedures Procedures (including critical care time)  Medications Ordered in ED Medications  hydrALAZINE (APRESOLINE) injection 10 mg (10 mg Intravenous Given 01/20/18 1257)     Initial Impression / Assessment and Plan / ED Course  I have reviewed the triage vital signs and the nursing notes.  Pertinent labs & imaging results that were available during my care of the patient were reviewed by me and considered in my medical decision making (see chart for details).     Patient had elevated troponin but no acute changes on EKG.  I spoke spoke with cardiology and it was decided we should treat his blood pressure and then reevaluate him in 2 hours.  His blood pressure improved with extra hydralazine.  Patient still did not have any more discomfort.   Patient stated that he was going to leave he did not want to stay longer.  I was can speak to the cardiologist about how he  did over the last 2 hours but since patient decided he was going to leave.  He started taken off of his leads and said he was can ago.  So I discharged him with instructions to take 50 mg of Apresoline 3 times a day.  This is what the cardiologist had suggested to increase his blood pressure medicine. Final Clinical Impressions(s) / ED Diagnoses   Final diagnoses:  None    ED Discharge Orders    None       Bethann Berkshire, MD 01/20/18 1527

## 2018-01-20 NOTE — ED Triage Notes (Signed)
Chest pain onset this am around 0800. Pain free on arrival

## 2018-01-29 ENCOUNTER — Encounter: Payer: Self-pay | Admitting: Internal Medicine

## 2018-01-29 ENCOUNTER — Ambulatory Visit (INDEPENDENT_AMBULATORY_CARE_PROVIDER_SITE_OTHER): Payer: Medicare Other | Admitting: Internal Medicine

## 2018-01-29 ENCOUNTER — Ambulatory Visit: Payer: Medicare Other | Admitting: Internal Medicine

## 2018-01-29 VITALS — BP 122/80 | HR 62 | Ht 70.0 in | Wt 200.2 lb

## 2018-01-29 DIAGNOSIS — I471 Supraventricular tachycardia: Secondary | ICD-10-CM

## 2018-01-29 DIAGNOSIS — I5042 Chronic combined systolic (congestive) and diastolic (congestive) heart failure: Secondary | ICD-10-CM

## 2018-01-29 NOTE — Patient Instructions (Addendum)
Medication Instructions:  Your physician recommends that you continue on your current medications as directed. Please refer to the Current Medication list given to you today.  Labwork: No blood work needed.  Testing/Procedures: Your physician has recommended that you have a pacemaker inserted. A pacemaker is a small device that is placed under the skin of your chest or abdomen to help control abnormal heart rhythms. This device uses electrical pulses to prompt the heart to beat at a normal rate. Pacemakers are used to treat heart rhythms that are too slow. Wire (leads) are attached to the pacemaker that goes into the chambers of you heart. This is done in the hospital and usually requires and overnight stay. Please see the instruction sheet given to you today for more information.  Follow-Up: You will follow up with the device clinic 10-14 days after your procedure for a wound check. You will follow up with Dr. Ladona Ridgel 91 days after your procedure.   INSTRUCTIONS for ICD placement:  Please arrive at the [facility] main entrance of [facility] hospital at:  11:30 am on 01/30/2018 Use the CHG surgical scrub the night before and morning of your procedure.  Follow the instruction sheet. Do not eat or drink after midnight prior to procedure-you can have clear liquids until 5:00 am.  You may have small sips of water after 5:00 am. On the morning of your procedure take your normal AM medications with a sip of water except for:  Metolazone or potassium. Plan for one night stay You will need someone to drive you home at discharge   If you need a refill on your cardiac medications before your next appointment, please call your pharmacy.   Cardioverter Defibrillator Implantation An implantable cardioverter defibrillator (ICD) is a small device that is placed under the skin in the chest or abdomen. An ICD consists of a battery, a small computer (pulse generator), and wires (leads) that go into the  heart. An ICD is used to detect and correct two types of dangerous irregular heartbeats (arrhythmias):  A rapid heart rhythm (tachycardia).  An arrhythmia in which the lower chambers of the heart (ventricles) contract in an uncoordinated way (fibrillation).  When an ICD detects tachycardia, it sends a low-energy shock to the heart to restore the heartbeat to normal (cardioversion). This signal is usually painless. If cardioversion does not work or if the ICD detects fibrillation, it delivers a high-energy shock to the heart (defibrillation) to restart the heart. This shock may feel like a strong jolt in the chest. Your health care provider may prescribe an ICD if:  You have had an arrhythmia that originated in the ventricles.  Your heart has been damaged by a disease or heart condition.  Sometimes, ICDs are programmed to act as a device called a pacemaker. Pacemakers can be used to treat a slow heartbeat (bradycardia) or tachycardia by taking over the heart rate with electrical impulses. Tell a health care provider about:  Any allergies you have.  All medicines you are taking, including vitamins, herbs, eye drops, creams, and over-the-counter medicines.  Any problems you or family members have had with anesthetic medicines.  Any blood disorders you have.  Any surgeries you have had.  Any medical conditions you have.  Whether you are pregnant or may be pregnant. What are the risks? Generally, this is a safe procedure. However, problems may occur, including:  Swelling, bleeding, or bruising.  Infection.  Blood clots.  Damage to other structures or organs, such as  nerves, blood vessels, or the heart.  Allergic reactions to medicines used during the procedure.  What happens before the procedure? Staying hydrated Follow instructions from your health care provider about hydration, which may include:  Up to 2 hours before the procedure - you may continue to drink clear  liquids, such as water, clear fruit juice, black coffee, and plain tea.  Eating and drinking restrictions Follow instructions from your health care provider about eating and drinking, which may include:  8 hours before the procedure - stop eating heavy meals or foods such as meat, fried foods, or fatty foods.  6 hours before the procedure - stop eating light meals or foods, such as toast or cereal.  6 hours before the procedure - stop drinking milk or drinks that contain milk.  2 hours before the procedure - stop drinking clear liquids.  Medicine Ask your health care provider about:  Changing or stopping your normal medicines. This is important if you take diabetes medicines or blood thinners.  Taking medicines such as aspirin and ibuprofen. These medicines can thin your blood. Do not take these medicines before your procedure if your doctor tells you not to.  Tests  You may have blood tests.  You may have a test to check the electrical signals in your heart (electrocardiogram, ECG).  You may have imaging tests, such as a chest X-ray. General instructions  For 24 hours before the procedure, stop using products that contain nicotine or tobacco, such as cigarettes and e-cigarettes. If you need help quitting, ask your health care provider.  Plan to have someone take you home from the hospital or clinic.  You may be asked to shower with a germ-killing soap. What happens during the procedure?  To reduce your risk of infection: ? Your health care team will wash or sanitize their hands. ? Your skin will be washed with soap. ? Hair may be removed from the surgical area.  Small monitors will be put on your body. They will be used to check your heart, blood pressure, and oxygen level.  An IV tube will be inserted into one of your veins.  You will be given one or more of the following: ? A medicine to help you relax (sedative). ? A medicine to numb the area (local  anesthetic). ? A medicine to make you fall asleep (general anesthetic).  Leads will be guided through a blood vessel into your heart and attached to your heart muscles. Depending on the ICD, the leads may go into one ventricle or they may go into both ventricles and into an upper chamber of the heart. An X-ray machine (fluoroscope) will be usedto help guide the leads.  A small incision will be made to create a deep pocket under your skin.  The pulse generator will be placed into the pocket.  The ICD will be tested.  The incision will be closed with stitches (sutures), skin glue, or staples.  A bandage (dressing) will be placed over the incision. This procedure may vary among health care providers and hospitals. What happens after the procedure?  Your blood pressure, heart rate, breathing rate, and blood oxygen level will be monitored often until the medicines you were given have worn off.  A chest X-ray will be taken to check that the ICD is in the right place.  You will need to stay in the hospital for 1-2 days so your health care provider can make sure your ICD is working.  Do not drive  for 24 hours if you received a sedative. Ask your health care provider when it is safe for you to drive.  You may be given an identification card explaining that you have an ICD. Summary  An implantable cardioverter defibrillator (ICD) is a small device that is placed under the skin in the chest or abdomen. It is used to detect and correct dangerous irregular heartbeats (arrhythmias).  An ICD consists of a battery, a small computer (pulse generator), and wires (leads) that go into the heart.  When an ICD detects rapid heart rhythm (tachycardia), it sends a low-energy shock to the heart to restore the heartbeat to normal (cardioversion). If cardioversion does not work or if the ICD detects uncoordinated heart contractions (fibrillation), it delivers a high-energy shock to the heart (defibrillation) to  restart the heart.  You will need to stay in the hospital for 1-2 days to make sure your ICD is working. This information is not intended to replace advice given to you by your health care provider. Make sure you discuss any questions you have with your health care provider. Document Released: 05/12/2002 Document Revised: 08/29/2016 Document Reviewed: 08/29/2016 Elsevier Interactive Patient Education  2017 [employer].

## 2018-01-29 NOTE — H&P (View-Only) (Signed)
HPI [name] is referred today for evaluation of CHF and consideration for ICD insertion. He is a pleasant 52 yo man with a long standing h/o CHF who has been on medical therapy. He has NSVT. He has worn a life vest. He has not had any shocks. He has not had syncope. The patient has been on maximal medical therapy. He [place] thought to have a mixed CM with occluded RCA. He denies anginal symptoms. He notes some palpitations. No Known Allergies   Current Outpatient Medications  Medication Sig Dispense Refill  . amiodarone (PACERONE) 200 MG tablet Take 200 mg by mouth daily.    Marland Kitchen aspirin 81 MG tablet Take 1 tablet (81 mg total) by mouth daily.    Marland Kitchen atorvastatin (LIPITOR) 80 MG tablet Take 80 mg by mouth daily.    . budesonide-formoterol (SYMBICORT) 160-4.5 MCG/ACT inhaler Inhale 2 puffs into the lungs 2 (two) times daily.    . calcitRIOL (ROCALTROL) 0.25 MCG capsule Take 0.25 mcg by mouth 3 (three) times a week. On Monday, Wednesday, and Friday.    . carvedilol (COREG) 25 MG tablet Take 1 tablet (25 mg total) by mouth 2 (two) times daily with a meal. 60 tablet 2  . cholecalciferol (VITAMIN D) 1000 UNITS tablet Take 1,000 Units by mouth daily.    . Febuxostat (ULORIC) 80 MG TABS Take 1 tablet by mouth daily.    . hydrALAZINE (APRESOLINE) 25 MG tablet Take 1 tablet (25 mg total) by mouth every 8 (eight) hours. 90 tablet 2  . isosorbide mononitrate (IMDUR) 120 MG 24 hr tablet TAKE 1 TABLET (120 MG TOTAL) BY MOUTH DAILY. 30 tablet 6  . metolazone (ZAROXOLYN) 5 MG tablet Take 1 tablet by mouth daily.    . nitroGLYCERIN (NITROSTAT) 0.4 MG SL tablet Place 1 tablet (0.4 mg total) under the tongue every 5 (five) minutes as needed for chest pain. KEEP OV. 25 tablet 0  . potassium chloride SA (K-DUR,KLOR-CON) 20 MEQ tablet Take 2 tablets (40 mEq total) by mouth 2 (two) times daily. 120 tablet 2  . ranolazine (RANEXA) 500 MG 12 hr tablet TAKE ONE TABLET BY MOUTH 2 TIMES A DAY **CALL OFFICE TO MAKE  APPOINTMENT** 60 tablet 2   No current facility-administered medications for this visit.      Past Medical History:  Diagnosis Date  . Anemia    a. 2008 in setting of profound epistaxis.  Marland Kitchen CAD (coronary artery disease)    a. NSTEMI 2008: in setting of profound epistaxis/anemia, cath showing severe diffuse RCA dz with L-R collaterals, nonobst dz in left system, managed medically. b. Type II NSTEMI 10/2008. c. Last cath 2012 - for med rx. d. 05/2013: elevated troponin ? due to ARF.  e. Ranexa added 03/2012.  . Cardiomyopathy (HCC)    a. Mixed ICM/NICM - Prior EF 25% back to 2008. b. Improved to 55% in 2012. c. Subsequent echoes: 45% in 11/2010, 15% by echo 05/2013.and 2016  . CHF (congestive heart failure) (HCC)   . Chronic kidney disease    a. Average creatine of about 2.3  . Epistaxis    a. 2008, 2009 a/w significant anemia.  Marland Kitchen History of pneumonia 08/2012, 05/2013  . Hyperlipidemia   . Hypertension    a. Difficult to control. b. Renal duplex neg for RAS 11/2012.  Marland Kitchen SVT (supraventricular tachycardia) (HCC)    a. 05/2013 tx with 6mg  adenosine in ER.    ROS:   All systems reviewed and  negative except as noted in the HPI.   Past Surgical History:  Procedure Laterality Date  . CARDIAC CATHETERIZATION  2012   total RCA occlusion with left-to-right collaterals supplying the distal RCA branches, mild diffuse nonobstructive disease of LAD and LCx. Medically managed     Family History  Problem Relation Age of Onset  . Hypertension Mother        deceased  . Diabetes Mother        deceased  . Heart disease Mother        deceased  . Heart attack Father        at age 6, deceased     Social History   Socioeconomic History  . Marital status: Single    Spouse name: Not on file  . Number of children: 6  . Years of education: Not on file  . Highest education level: Not on file  Occupational History  . Occupation: Unemployed  Social Needs  . Financial resource strain: Not on  file  . Food insecurity:    Worry: Not on file    Inability: Not on file  . Transportation needs:    Medical: Not on file    Non-medical: Not on file  Tobacco Use  . Smoking status: Former Smoker    Packs/day: 1.00    Years: 10.00    Pack years: 10.00    Types: Cigarettes    Last attempt to quit: 11/16/2006    Years since quitting: 11.2  . Smokeless tobacco: Never Used  Substance and Sexual Activity  . Alcohol use: No    Alcohol/week: 0.0 oz  . Drug use: Yes    Comment: Previous marijuana use  . Sexual activity: Not on file  Lifestyle  . Physical activity:    Days per week: Not on file    Minutes per session: Not on file  . Stress: Not on file  Relationships  . Social connections:    Talks on phone: Not on file    Gets together: Not on file    Attends religious service: Not on file    Active member of club or organization: Not on file    Attends meetings of clubs or organizations: Not on file    Relationship status: Not on file  . Intimate partner violence:    Fear of current or ex partner: Not on file    Emotionally abused: Not on file    Physically abused: Not on file    Forced sexual activity: Not on file  Other Topics Concern  . Not on file  Social History Narrative   The patient lives in [place] with his sister. He is legally separated and has 6 healthy children who do not live with him. He is not employed and is trying to get disability.     BP 122/80   Pulse 62   Ht 5\' 10"  (1.778 m)   Wt 200 lb 3.2 oz (90.8 kg)   SpO2 94%   BMI 28.73 kg/m   Physical Exam:  Well appearing middle aged man, NAD HEENT: Unremarkable Neck:  6 cm JVD, no thyromegally Lymphatics:  No adenopathy Back:  No CVA tenderness Lungs:  Clear with no wheezes HEART:  Regular rate rhythm, no murmurs, no rubs, no clicks Abd:  soft, positive bowel sounds, no organomegally, no rebound, no guarding Ext:  2 plus pulses, no edema, no cyanosis, no clubbing Skin:  No rashes no  nodules Neuro:  CN II through XII intact, motor grossly  intact  EKG - NSR with IVCD   Assess/Plan: 1. Chronic systolic heart failure - he has an EF of 15% despite maximal medical therapy. I have discussed the indications/risks/benefits/goals/expectations of ICD insertion with the patient and he wishes to proceed. 2. CAD - the patient denies anginal symptoms. Will continue his current meds.  Leonia Reeves.D.

## 2018-01-29 NOTE — Progress Notes (Signed)
HPI [name] is referred today for evaluation of CHF and consideration for ICD insertion. He is a pleasant 52 yo man with a long standing h/o CHF who has been on medical therapy. He has NSVT. He has worn a life vest. He has not had any shocks. He has not had syncope. The patient has been on maximal medical therapy. He [place] thought to have a mixed CM with occluded RCA. He denies anginal symptoms. He notes some palpitations. No Known Allergies   Current Outpatient Medications  Medication Sig Dispense Refill  . amiodarone (PACERONE) 200 MG tablet Take 200 mg by mouth daily.    Marland Kitchen aspirin 81 MG tablet Take 1 tablet (81 mg total) by mouth daily.    Marland Kitchen atorvastatin (LIPITOR) 80 MG tablet Take 80 mg by mouth daily.    . budesonide-formoterol (SYMBICORT) 160-4.5 MCG/ACT inhaler Inhale 2 puffs into the lungs 2 (two) times daily.    . calcitRIOL (ROCALTROL) 0.25 MCG capsule Take 0.25 mcg by mouth 3 (three) times a week. On Monday, Wednesday, and Friday.    . carvedilol (COREG) 25 MG tablet Take 1 tablet (25 mg total) by mouth 2 (two) times daily with a meal. 60 tablet 2  . cholecalciferol (VITAMIN D) 1000 UNITS tablet Take 1,000 Units by mouth daily.    . Febuxostat (ULORIC) 80 MG TABS Take 1 tablet by mouth daily.    . hydrALAZINE (APRESOLINE) 25 MG tablet Take 1 tablet (25 mg total) by mouth every 8 (eight) hours. 90 tablet 2  . isosorbide mononitrate (IMDUR) 120 MG 24 hr tablet TAKE 1 TABLET (120 MG TOTAL) BY MOUTH DAILY. 30 tablet 6  . metolazone (ZAROXOLYN) 5 MG tablet Take 1 tablet by mouth daily.    . nitroGLYCERIN (NITROSTAT) 0.4 MG SL tablet Place 1 tablet (0.4 mg total) under the tongue every 5 (five) minutes as needed for chest pain. KEEP OV. 25 tablet 0  . potassium chloride SA (K-DUR,KLOR-CON) 20 MEQ tablet Take 2 tablets (40 mEq total) by mouth 2 (two) times daily. 120 tablet 2  . ranolazine (RANEXA) 500 MG 12 hr tablet TAKE ONE TABLET BY MOUTH 2 TIMES A DAY **CALL OFFICE TO MAKE  APPOINTMENT** 60 tablet 2   No current facility-administered medications for this visit.      Past Medical History:  Diagnosis Date  . Anemia    a. 2008 in setting of profound epistaxis.  Marland Kitchen CAD (coronary artery disease)    a. NSTEMI 2008: in setting of profound epistaxis/anemia, cath showing severe diffuse RCA dz with L-R collaterals, nonobst dz in left system, managed medically. b. Type II NSTEMI 10/2008. c. Last cath 2012 - for med rx. d. 05/2013: elevated troponin ? due to ARF.  e. Ranexa added 03/2012.  . Cardiomyopathy (HCC)    a. Mixed ICM/NICM - Prior EF 25% back to 2008. b. Improved to 55% in 2012. c. Subsequent echoes: 45% in 11/2010, 15% by echo 05/2013.and 2016  . CHF (congestive heart failure) (HCC)   . Chronic kidney disease    a. Average creatine of about 2.3  . Epistaxis    a. 2008, 2009 a/w significant anemia.  Marland Kitchen History of pneumonia 08/2012, 05/2013  . Hyperlipidemia   . Hypertension    a. Difficult to control. b. Renal duplex neg for RAS 11/2012.  Marland Kitchen SVT (supraventricular tachycardia) (HCC)    a. 05/2013 tx with 6mg  adenosine in ER.    ROS:   All systems reviewed and  negative except as noted in the HPI.   Past Surgical History:  Procedure Laterality Date  . CARDIAC CATHETERIZATION  2012   total RCA occlusion with left-to-right collaterals supplying the distal RCA branches, mild diffuse nonobstructive disease of LAD and LCx. Medically managed     Family History  Problem Relation Age of Onset  . Hypertension Mother        deceased  . Diabetes Mother        deceased  . Heart disease Mother        deceased  . Heart attack Father        at age 6, deceased     Social History   Socioeconomic History  . Marital status: Single    Spouse name: Not on file  . Number of children: 6  . Years of education: Not on file  . Highest education level: Not on file  Occupational History  . Occupation: Unemployed  Social Needs  . Financial resource strain: Not on  file  . Food insecurity:    Worry: Not on file    Inability: Not on file  . Transportation needs:    Medical: Not on file    Non-medical: Not on file  Tobacco Use  . Smoking status: Former Smoker    Packs/day: 1.00    Years: 10.00    Pack years: 10.00    Types: Cigarettes    Last attempt to quit: 11/16/2006    Years since quitting: 11.2  . Smokeless tobacco: Never Used  Substance and Sexual Activity  . Alcohol use: No    Alcohol/week: 0.0 oz  . Drug use: Yes    Comment: Previous marijuana use  . Sexual activity: Not on file  Lifestyle  . Physical activity:    Days per week: Not on file    Minutes per session: Not on file  . Stress: Not on file  Relationships  . Social connections:    Talks on phone: Not on file    Gets together: Not on file    Attends religious service: Not on file    Active member of club or organization: Not on file    Attends meetings of clubs or organizations: Not on file    Relationship status: Not on file  . Intimate partner violence:    Fear of current or ex partner: Not on file    Emotionally abused: Not on file    Physically abused: Not on file    Forced sexual activity: Not on file  Other Topics Concern  . Not on file  Social History Narrative   The patient lives in [place] with his sister. He is legally separated and has 6 healthy children who do not live with him. He is not employed and is trying to get disability.     BP 122/80   Pulse 62   Ht 5\' 10"  (1.778 m)   Wt 200 lb 3.2 oz (90.8 kg)   SpO2 94%   BMI 28.73 kg/m   Physical Exam:  Well appearing middle aged man, NAD HEENT: Unremarkable Neck:  6 cm JVD, no thyromegally Lymphatics:  No adenopathy Back:  No CVA tenderness Lungs:  Clear with no wheezes HEART:  Regular rate rhythm, no murmurs, no rubs, no clicks Abd:  soft, positive bowel sounds, no organomegally, no rebound, no guarding Ext:  2 plus pulses, no edema, no cyanosis, no clubbing Skin:  No rashes no  nodules Neuro:  CN II through XII intact, motor grossly  intact  EKG - NSR with IVCD   Assess/Plan: 1. Chronic systolic heart failure - he has an EF of 15% despite maximal medical therapy. I have discussed the indications/risks/benefits/goals/expectations of ICD insertion with the patient and he wishes to proceed. 2. CAD - the patient denies anginal symptoms. Will continue his current meds.  Leonia Reeves.D.

## 2018-01-30 ENCOUNTER — Encounter (HOSPITAL_COMMUNITY): Payer: Self-pay | Admitting: Emergency Medicine

## 2018-01-30 ENCOUNTER — Other Ambulatory Visit: Payer: Self-pay

## 2018-01-30 ENCOUNTER — Encounter (HOSPITAL_COMMUNITY)
Admission: RE | Disposition: A | Discharge status: Home / Self Care | Payer: Self-pay | Source: Ambulatory Visit | Attending: Internal Medicine

## 2018-01-30 ENCOUNTER — Ambulatory Visit (HOSPITAL_COMMUNITY)
Admission: RE | Admit: 2018-01-30 | Discharge: 2018-01-31 | Disposition: A | Discharge status: Home / Self Care | Payer: Medicare Other | Source: Ambulatory Visit | Attending: Internal Medicine | Admitting: Internal Medicine

## 2018-01-30 DIAGNOSIS — Z7951 Long term (current) use of inhaled steroids: Secondary | ICD-10-CM | POA: Insufficient documentation

## 2018-01-30 DIAGNOSIS — I472 Ventricular tachycardia: Secondary | ICD-10-CM | POA: Insufficient documentation

## 2018-01-30 DIAGNOSIS — I480 Paroxysmal atrial fibrillation: Secondary | ICD-10-CM | POA: Diagnosis not present

## 2018-01-30 DIAGNOSIS — Z7982 Long term (current) use of aspirin: Secondary | ICD-10-CM | POA: Diagnosis not present

## 2018-01-30 DIAGNOSIS — I2510 Atherosclerotic heart disease of native coronary artery without angina pectoris: Secondary | ICD-10-CM | POA: Diagnosis not present

## 2018-01-30 DIAGNOSIS — I471 Supraventricular tachycardia: Secondary | ICD-10-CM | POA: Diagnosis not present

## 2018-01-30 DIAGNOSIS — I428 Other cardiomyopathies: Secondary | ICD-10-CM | POA: Insufficient documentation

## 2018-01-30 DIAGNOSIS — I5042 Chronic combined systolic (congestive) and diastolic (congestive) heart failure: Secondary | ICD-10-CM | POA: Diagnosis present

## 2018-01-30 DIAGNOSIS — I252 Old myocardial infarction: Secondary | ICD-10-CM | POA: Insufficient documentation

## 2018-01-30 DIAGNOSIS — I255 Ischemic cardiomyopathy: Secondary | ICD-10-CM | POA: Insufficient documentation

## 2018-01-30 DIAGNOSIS — I130 Hypertensive heart and chronic kidney disease with heart failure and stage 1 through stage 4 chronic kidney disease, or unspecified chronic kidney disease: Secondary | ICD-10-CM | POA: Diagnosis not present

## 2018-01-30 DIAGNOSIS — I5022 Chronic systolic (congestive) heart failure: Secondary | ICD-10-CM | POA: Diagnosis present

## 2018-01-30 DIAGNOSIS — N189 Chronic kidney disease, unspecified: Secondary | ICD-10-CM | POA: Diagnosis not present

## 2018-01-30 DIAGNOSIS — Z95810 Presence of automatic (implantable) cardiac defibrillator: Secondary | ICD-10-CM

## 2018-01-30 DIAGNOSIS — E785 Hyperlipidemia, unspecified: Secondary | ICD-10-CM | POA: Diagnosis not present

## 2018-01-30 DIAGNOSIS — Z87891 Personal history of nicotine dependence: Secondary | ICD-10-CM | POA: Diagnosis not present

## 2018-01-30 HISTORY — PX: ICD IMPLANT: EP1208

## 2018-01-30 LAB — SURGICAL PCR SCREEN
MRSA, PCR: NEGATIVE
STAPHYLOCOCCUS AUREUS: NEGATIVE

## 2018-01-30 LAB — GLUCOSE, CAPILLARY: Glucose-Capillary: 96 mg/dL (ref 65–99)

## 2018-01-30 SURGERY — ICD IMPLANT

## 2018-01-30 MED ORDER — TORSEMIDE 20 MG PO TABS
20.0000 mg | ORAL_TABLET | Freq: Every day | ORAL | Status: DC
  Administered 2018-01-30 – 2018-01-31 (×2): 20 mg via ORAL
  Filled 2018-01-30 (×2): qty 1

## 2018-01-30 MED ORDER — CEFAZOLIN SODIUM-DEXTROSE 1-4 GM/50ML-% IV SOLN
1.0000 g | Freq: Four times a day (QID) | INTRAVENOUS | Status: AC
  Administered 2018-01-30 – 2018-01-31 (×3): 1 g via INTRAVENOUS
  Filled 2018-01-30 (×3): qty 50

## 2018-01-30 MED ORDER — FEBUXOSTAT 80 MG PO TABS: 1.0000 | ORAL_TABLET | Freq: Every day | ORAL | Status: DC

## 2018-01-30 MED ORDER — HEPARIN (PORCINE) IN NACL 1000-0.9 UT/500ML-% IV SOLN
INTRAVENOUS | Status: AC
  Filled 2018-01-30: qty 500

## 2018-01-30 MED ORDER — SODIUM CHLORIDE 0.9 % IV SOLN
80.0000 mg | INTRAVENOUS | Status: AC
  Administered 2018-01-30: 80 mg

## 2018-01-30 MED ORDER — METOLAZONE 5 MG PO TABS
5.0000 mg | ORAL_TABLET | Freq: Every day | ORAL | Status: DC
  Administered 2018-01-30 – 2018-01-31 (×2): 5 mg via ORAL
  Filled 2018-01-30 (×2): qty 1

## 2018-01-30 MED ORDER — HYDRALAZINE HCL 25 MG PO TABS
25.0000 mg | ORAL_TABLET | Freq: Three times a day (TID) | ORAL | Status: DC
  Administered 2018-01-30 – 2018-01-31 (×3): 25 mg via ORAL
  Filled 2018-01-30 (×3): qty 1

## 2018-01-30 MED ORDER — FEBUXOSTAT 40 MG PO TABS
80.0000 mg | ORAL_TABLET | Freq: Every day | ORAL | Status: DC
  Administered 2018-01-30 – 2018-01-31 (×2): 80 mg via ORAL
  Filled 2018-01-30 (×2): qty 2

## 2018-01-30 MED ORDER — MIDAZOLAM HCL 5 MG/5ML IJ SOLN
INTRAMUSCULAR | Status: DC | PRN
  Administered 2018-01-30 (×3): 1 mg via INTRAVENOUS

## 2018-01-30 MED ORDER — LIDOCAINE HCL (PF) 1 % IJ SOLN
INTRAMUSCULAR | Status: AC
  Filled 2018-01-30: qty 60

## 2018-01-30 MED ORDER — NITROGLYCERIN 0.4 MG SL SUBL
0.4000 mg | SUBLINGUAL_TABLET | SUBLINGUAL | Status: DC | PRN
Start: 2018-01-30 — End: 2018-01-31

## 2018-01-30 MED ORDER — SODIUM CHLORIDE 0.9 % IV SOLN
INTRAVENOUS | Status: AC
  Filled 2018-01-30: qty 2

## 2018-01-30 MED ORDER — FENTANYL CITRATE (PF) 100 MCG/2ML IJ SOLN
INTRAMUSCULAR | Status: AC
  Filled 2018-01-30: qty 2

## 2018-01-30 MED ORDER — CHLORHEXIDINE GLUCONATE 4 % EX LIQD: 60.0000 mL | Freq: Once | CUTANEOUS | Status: DC

## 2018-01-30 MED ORDER — CARVEDILOL 25 MG PO TABS
25.0000 mg | ORAL_TABLET | Freq: Two times a day (BID) | ORAL | Status: DC
  Administered 2018-01-30 – 2018-01-31 (×2): 25 mg via ORAL
  Filled 2018-01-30 (×2): qty 1

## 2018-01-30 MED ORDER — LIDOCAINE HCL (PF) 1 % IJ SOLN
INTRAMUSCULAR | Status: DC | PRN
  Administered 2018-01-30: 30 mL

## 2018-01-30 MED ORDER — ACETAMINOPHEN 325 MG PO TABS
325.0000 mg | ORAL_TABLET | ORAL | Status: DC | PRN
  Administered 2018-01-31 (×2): 650 mg via ORAL
  Filled 2018-01-30 (×2): qty 2

## 2018-01-30 MED ORDER — CEFAZOLIN SODIUM-DEXTROSE 2-4 GM/100ML-% IV SOLN
INTRAVENOUS | Status: AC
  Filled 2018-01-30: qty 100

## 2018-01-30 MED ORDER — AMIODARONE HCL 200 MG PO TABS
200.0000 mg | ORAL_TABLET | Freq: Every day | ORAL | Status: DC
  Administered 2018-01-31: 200 mg via ORAL
  Filled 2018-01-30: qty 1

## 2018-01-30 MED ORDER — ATORVASTATIN CALCIUM 80 MG PO TABS
80.0000 mg | ORAL_TABLET | Freq: Every day | ORAL | Status: DC
  Administered 2018-01-31: 80 mg via ORAL
  Filled 2018-01-30: qty 1

## 2018-01-30 MED ORDER — CEFAZOLIN SODIUM-DEXTROSE 2-4 GM/100ML-% IV SOLN
2.0000 g | INTRAVENOUS | Status: AC
  Administered 2018-01-30: 2 g via INTRAVENOUS

## 2018-01-30 MED ORDER — POTASSIUM CHLORIDE CRYS ER 20 MEQ PO TBCR
40.0000 meq | EXTENDED_RELEASE_TABLET | Freq: Two times a day (BID) | ORAL | Status: DC
  Administered 2018-01-30 – 2018-01-31 (×2): 40 meq via ORAL
  Filled 2018-01-30 (×2): qty 2

## 2018-01-30 MED ORDER — FENTANYL CITRATE (PF) 100 MCG/2ML IJ SOLN
INTRAMUSCULAR | Status: DC | PRN
  Administered 2018-01-30: 12.5 ug via INTRAVENOUS
  Administered 2018-01-30: 25 ug via INTRAVENOUS

## 2018-01-30 MED ORDER — RANOLAZINE ER 500 MG PO TB12
500.0000 mg | ORAL_TABLET | Freq: Two times a day (BID) | ORAL | Status: DC
  Administered 2018-01-30 – 2018-01-31 (×2): 500 mg via ORAL
  Filled 2018-01-30 (×2): qty 1

## 2018-01-30 MED ORDER — MIDAZOLAM HCL 5 MG/5ML IJ SOLN
INTRAMUSCULAR | Status: AC
  Filled 2018-01-30: qty 5

## 2018-01-30 MED ORDER — HEPARIN (PORCINE) IN NACL 2-0.9 UNITS/ML
INTRAMUSCULAR | Status: AC | PRN
  Administered 2018-01-30: 500 mL

## 2018-01-30 MED ORDER — MUPIROCIN 2 % EX OINT
TOPICAL_OINTMENT | CUTANEOUS | Status: AC
  Administered 2018-01-30: 12:00:00
  Filled 2018-01-30: qty 22

## 2018-01-30 MED ORDER — SODIUM CHLORIDE 0.9 % IV SOLN
INTRAVENOUS | Status: DC
  Administered 2018-01-30: 12:00:00 via INTRAVENOUS

## 2018-01-30 MED ORDER — ISOSORBIDE MONONITRATE ER 60 MG PO TB24
120.0000 mg | ORAL_TABLET | Freq: Every day | ORAL | Status: DC
  Administered 2018-01-30 – 2018-01-31 (×2): 120 mg via ORAL
  Filled 2018-01-30 (×2): qty 2

## 2018-01-30 MED ORDER — VITAMIN D 1000 UNITS PO TABS
1000.0000 [IU] | ORAL_TABLET | Freq: Every day | ORAL | Status: DC
  Administered 2018-01-30 – 2018-01-31 (×2): 1000 [IU] via ORAL
  Filled 2018-01-30 (×2): qty 1

## 2018-01-30 MED ORDER — ONDANSETRON HCL 4 MG/2ML IJ SOLN: 4.0000 mg | Freq: Four times a day (QID) | INTRAMUSCULAR | Status: DC | PRN

## 2018-01-30 MED ORDER — ASPIRIN EC 81 MG PO TBEC
81.0000 mg | DELAYED_RELEASE_TABLET | Freq: Every day | ORAL | Status: DC
  Administered 2018-01-31: 81 mg via ORAL
  Filled 2018-01-30: qty 1

## 2018-01-30 MED ORDER — CALCITRIOL 0.25 MCG PO CAPS
0.2500 ug | ORAL_CAPSULE | ORAL | Status: DC
  Administered 2018-01-31: 0.25 ug via ORAL
  Filled 2018-01-30: qty 1

## 2018-01-30 SURGICAL SUPPLY — 9 items
CABLE SURGICAL S-101-97-12 (CABLE) ×2 IMPLANT
ICD VIGILANT DR D233 (Pacemaker) ×2 IMPLANT
INGEVITY MRI 7741-52CM (Lead) ×3 IMPLANT
LEAD PACING INGEVITY MRI 52CM (Lead) IMPLANT
LEAD RELIANCE G DF4 0293 (Lead) ×2 IMPLANT
PAD DEFIB LIFELINK (PAD) ×2 IMPLANT
SHEATH CLASSIC 7F (SHEATH) ×2 IMPLANT
SHEATH CLASSIC 9F (SHEATH) ×2 IMPLANT
TRAY PACEMAKER INSERTION (PACKS) ×2 IMPLANT

## 2018-01-30 NOTE — Discharge Instructions (Signed)
° ° °  Supplemental Discharge Instructions for  °Pacemaker/Defibrillator Patients ° °Activity °No heavy lifting or vigorous activity with your left/right arm for 6 to 8 weeks.  Do not raise your left/right arm above your head for one week.  Gradually raise your affected arm as drawn below. ° °        °    02/03/18                      02/04/18                      02/05/18                     02/06/18 °__ ° °NO DRIVING for  1 week  ; you may begin driving on  02/06/18  . ° °WOUND CARE °- Keep the wound area clean and dry.  Do not get this area wet for one week. No showers for one week; you may shower on  02/06/18   . °- The tape/steri-strips on your wound will fall off; do not pull them off.  No bandage is needed on the site.  DO  NOT apply any creams, oils, or ointments to the wound area. °- If you notice any drainage or discharge from the wound, any swelling or bruising at the site, or you develop a fever > 101? F after you are discharged home, call the office at once. ° °Special Instructions °- You are still able to use cellular telephones; use the ear opposite the side where you have your pacemaker/defibrillator.  Avoid carrying your cellular phone near your device. °- When traveling through airports, show security personnel your identification card to avoid being screened in the metal detectors.  Ask the security personnel to use the hand wand. °- Avoid arc welding equipment, MRI testing (magnetic resonance imaging), TENS units (transcutaneous nerve stimulators).  Call the office for questions about other devices. °- Avoid electrical appliances that are in poor condition or are not properly grounded. °- Microwave ovens are safe to be near or to operate. ° °Additional information for defibrillator patients should your device go off: °- If your device goes off ONCE and you feel fine afterward, notify the device clinic nurses. °- If your device goes off ONCE and you do not feel well afterward, call 911. °- If your device goes  off TWICE, call 911. °- If your device goes off THREE times in one day, call 911. ° °DO NOT DRIVE YOURSELF OR A FAMILY MEMBER °WITH A DEFIBRILLATOR TO THE HOSPITAL--CALL 911. ° °

## 2018-01-30 NOTE — Interval H&P Note (Signed)
History and Physical Interval Note:  01/30/2018 12:00 PM  [name]  has presented today for surgery, with the diagnosis of chronic heart failure- systolic and diastolic  The various methods of treatment have been discussed with the patient and family. After consideration of risks, benefits and other options for treatment, the patient has consented to  Procedure(s): ICD IMPLANT (N/A) as a surgical intervention .  The patient's history has been reviewed, patient examined, no change in status, stable for surgery.  I have reviewed the patient's chart and labs.  Questions were answered to the patient's satisfaction.     Lewayne Bunting

## 2018-01-30 NOTE — Progress Notes (Signed)
Pt with life vest.  Marjean Donna, RN will remove and give to pt sister Hilda Lias.

## 2018-01-31 ENCOUNTER — Ambulatory Visit (HOSPITAL_COMMUNITY): Payer: Medicare Other

## 2018-01-31 DIAGNOSIS — I5022 Chronic systolic (congestive) heart failure: Secondary | ICD-10-CM | POA: Diagnosis not present

## 2018-01-31 DIAGNOSIS — I130 Hypertensive heart and chronic kidney disease with heart failure and stage 1 through stage 4 chronic kidney disease, or unspecified chronic kidney disease: Secondary | ICD-10-CM | POA: Diagnosis not present

## 2018-01-31 DIAGNOSIS — I428 Other cardiomyopathies: Secondary | ICD-10-CM | POA: Diagnosis not present

## 2018-01-31 DIAGNOSIS — I255 Ischemic cardiomyopathy: Secondary | ICD-10-CM | POA: Diagnosis not present

## 2018-01-31 NOTE — Discharge Summary (Addendum)
ELECTROPHYSIOLOGY PROCEDURE DISCHARGE SUMMARY    Patient ID: [name],  MRN: [id number], DOB/AGE: 01/23/1966 52 y.o.  Admit date: 01/30/2018 Discharge date: 01/31/2018  Primary Care Physician: Joette Catching, MD  Primary Cardiologist: Dr. Antoine Poche Electrophysiologist: Dr. Ladona Ridgel  Primary Discharge Diagnosis:  1. CM (mixed)  Secondary Discharge Diagnosis:  1. CAD 2. HTN 3. Chronic CHF (systolic) 4. CRI  No Known Allergies   Procedures This Admission:  1.  Implantation of a BSCi dual chamber ICD on 01/30/18 by Dr Ladona Ridgel.  The patient received a [employer] (serial Number [id number]) ICD,[employer] (serial # [id number] ) right atrial lead and a [employer] (serial number [id number]) right ventricular defibrillator lead   DFT's were deferred at time of implant.  There were no immediate post procedure complications. 2.  CXR on 01/31/18 demonstrated no pneumothorax status post device implantation.   Brief HPI: [name] is a 52 y.o. male was referred to electrophysiology in the outpatient setting for consideration of ICD implantation.  Past medical history includes above.  The patient has persistent LV dysfunction despite guideline directed therapy.  Risks, benefits, and alternatives to ICD implantation were reviewed with the patient who wished to proceed.   Hospital Course:  The patient was admitted and underwent implantation of an ICD with details as outlined above. He was monitored on telemetry overnight which demonstrated SR.  Left chest moderate sized hematoma, pressure was held for 25 minutes, reducing size to small, patient tolerated well.  The device was interrogated and found to be functioning normally.  CXR was obtained and demonstrated no pneumothorax status post device implantation.  Wound care, arm mobility, and restrictions were reviewed with the patient.  The patient feels well, with minimal discomfort post pressure, no CP or SOB, he was examined by Dr. Ladona Ridgel and  considered stable for discharge to home.   The patient's discharge medications include an no ACE/ARB given renal disease and beta blocker (carvediol).   Physical Exam: Vitals:   01/31/18 0207 01/31/18 0403 01/31/18 0500 01/31/18 0742  BP: 133/88 (!) 130/101  (!) 134/93  Pulse: 62 68  69  Resp: 12 14  20   Temp: 97.9 F (36.6 C) 98.2 F (36.8 C)  98.3 F (36.8 C)  TempSrc: Oral Oral  Oral  SpO2: 96% 96%  97%  Weight:   198 lb (89.8 kg)   Height:        GEN- The patient is well appearing, alert and oriented x 3 today.   HEENT: normocephalic, atraumatic; sclera clear, conjunctiva pink; hearing intact; oropharynx clear Lungs- CTA b/l, normal work of breathing.  No wheezes, rales, rhonchi Heart- RRR, no murmurs, rubs or gallops, PMI not laterally displaced GI- soft, non-tender, non-distended Extremities- no clubbing, cyanosis, or edema MS- no significant deformity or atrophy Skin- warm and dry, no rash or lesion, left chest with hematoma (reduced in size after pressure held, small ammount remains Psych- euthymic mood, full affect Neuro- no gross defecits  Labs:   Lab Results  Component Value Date   WBC 7.3 01/20/2018   HGB 12.4 (L) 01/20/2018   HCT 40.7 01/20/2018   MCV 84.1 01/20/2018   PLT 303 01/20/2018   No results for input(s): NA, K, CL, CO2, BUN, CREATININE, CALCIUM, PROT, BILITOT, ALKPHOS, ALT, AST, GLUCOSE in the last 168 hours.  Invalid input(s): LABALBU  Discharge Medications:  Allergies as of 01/31/2018   No Known Allergies     Medication List    TAKE these medications  amiodarone 200 MG tablet Commonly known as:  PACERONE Take 200 mg by mouth daily.   aspirin EC 81 MG tablet Take 1 tablet (81 mg total) by mouth daily.   atorvastatin 80 MG tablet Commonly known as:  LIPITOR Take 80 mg by mouth daily.   budesonide-formoterol 160-4.5 MCG/ACT inhaler Commonly known as:  SYMBICORT Inhale 2 puffs into the lungs 2 (two) times daily.   calcitRIOL 0.25  MCG capsule Commonly known as:  ROCALTROL Take 0.25 mcg by mouth 3 (three) times a week. On Monday, Wednesday, and Friday.   carvedilol 25 MG tablet Commonly known as:  COREG Take 1 tablet (25 mg total) by mouth 2 (two) times daily with a meal.   cholecalciferol 1000 units tablet Commonly known as:  VITAMIN D Take 1,000 Units by mouth daily.   hydrALAZINE 25 MG tablet Commonly known as:  APRESOLINE Take 1 tablet (25 mg total) by mouth every 8 (eight) hours.   isosorbide mononitrate 120 MG 24 hr tablet Commonly known as:  IMDUR TAKE 1 TABLET (120 MG TOTAL) BY MOUTH DAILY.   metolazone 5 MG tablet Commonly known as:  ZAROXOLYN Take 1 tablet by mouth daily.   nitroGLYCERIN 0.4 MG SL tablet Commonly known as:  NITROSTAT Place 1 tablet (0.4 mg total) under the tongue every 5 (five) minutes as needed for chest pain. KEEP OV.   potassium chloride SA 20 MEQ tablet Commonly known as:  K-DUR,KLOR-CON Take 2 tablets (40 mEq total) by mouth 2 (two) times daily.   ranolazine 500 MG 12 hr tablet Commonly known as:  RANEXA TAKE ONE TABLET BY MOUTH 2 TIMES A DAY **CALL OFFICE TO MAKE APPOINTMENT**   torsemide 20 MG tablet Commonly known as:  DEMADEX Take 20 mg by mouth daily.   ULORIC 80 MG Tabs Generic drug:  Febuxostat Take 1 tablet by mouth daily.       Disposition:  Home  Discharge Instructions    Diet - low sodium heart healthy   Complete by:  As directed    Increase activity slowly   Complete by:  As directed      Follow-up Information    [facility] Follow up on 02/03/2018.   Specialty:  Cardiology Why:  10:30AM, wound check visit Contact information: [address], [address] [place] [address] [phone]       Marinus Maw, MD Follow up on 05/07/2018.   Specialty:  Cardiology Why:  4:00PM Contact information: [address] [place] [address] [phone]        Rollene Rotunda, MD Follow  up on 03/19/2018.   Specialty:  Cardiology Why:  1:20PM Contact information: [address] [place] [address] [phone]           Duration of Discharge Encounter: Greater than 30 minutes including physician time.  Norma Fredrickson, PA-C 01/31/2018 10:04 AM   EP attending  Patient seen and examined.  Agree with the findings as noted above.  He is stable after his ICD insertion.  He has a postoperative hematoma which is small to moderate.  We have applied pressure to this.  He will be discharged home.  Usual follow-up.  Chest x-ray demonstrates good lead position.  No pneumothorax.  Lewayne Bunting, MD

## 2018-02-03 ENCOUNTER — Ambulatory Visit (INDEPENDENT_AMBULATORY_CARE_PROVIDER_SITE_OTHER): Payer: Self-pay | Admitting: *Deleted

## 2018-02-03 DIAGNOSIS — I420 Dilated cardiomyopathy: Secondary | ICD-10-CM

## 2018-02-03 DIAGNOSIS — I5042 Chronic combined systolic (congestive) and diastolic (congestive) heart failure: Secondary | ICD-10-CM

## 2018-02-03 DIAGNOSIS — Z95810 Presence of automatic (implantable) cardiac defibrillator: Secondary | ICD-10-CM

## 2018-02-03 NOTE — Progress Notes (Signed)
Hematoma check in clinic, s/p ICD implant on 01/30/18. Device not interrogated. Pressure dressing removed. Steri-strips remain in place until wound check. Small hematoma remains over left chest device site, now soft to palpation. Patient has held ASA since implant. Dr. Johney Frame assessed site, recommended continuing to hold ASA until hematoma resolves. Patient instructed to leave Steri-strips in place and to monitor for signs of swelling, drainage, increased pain, fever, or chills. Patient agrees to call if any of these symptoms are noted. Patient educated about shock plan and Latitude monitor. Full wound check on 02/11/18 while Dr. Ladona Ridgel is in the office.

## 2018-02-03 NOTE — Patient Instructions (Addendum)
HOLD (do not take) your aspirin until your wound check appointment on 02/11/18.  Ask if it is safe to resume taking aspirin at this appointment.

## 2018-02-11 ENCOUNTER — Ambulatory Visit (INDEPENDENT_AMBULATORY_CARE_PROVIDER_SITE_OTHER): Payer: Medicare Other | Admitting: *Deleted

## 2018-02-11 DIAGNOSIS — I5022 Chronic systolic (congestive) heart failure: Secondary | ICD-10-CM

## 2018-02-11 LAB — CUP PACEART INCLINIC DEVICE CHECK
Date Time Interrogation Session: 20190611040000
HighPow Impedance: 70 Ohm
Implantable Lead Implant Date: 20190530
Implantable Lead Location: 753860
Implantable Lead Serial Number: 440889
Implantable Lead Serial Number: 913152
Implantable Pulse Generator Implant Date: 20190530
Lead Channel Pacing Threshold Amplitude: 1.4 V
Lead Channel Pacing Threshold Pulse Width: 0.4 ms
Lead Channel Sensing Intrinsic Amplitude: 17 mV
Lead Channel Sensing Intrinsic Amplitude: 25 mV
Lead Channel Setting Pacing Amplitude: 3.5 V
Lead Channel Setting Pacing Pulse Width: 0.4 ms
Lead Channel Setting Sensing Sensitivity: 0.5 mV
MDC IDC LEAD IMPLANT DT: 20190530
MDC IDC LEAD LOCATION: 753859
MDC IDC MSMT LEADCHNL RA IMPEDANCE VALUE: 855 Ohm
MDC IDC MSMT LEADCHNL RV IMPEDANCE VALUE: 551 Ohm
MDC IDC MSMT LEADCHNL RV PACING THRESHOLD AMPLITUDE: 1.1 V
MDC IDC MSMT LEADCHNL RV PACING THRESHOLD PULSEWIDTH: 0.4 ms
MDC IDC PG SERIAL: 547981
MDC IDC SET LEADCHNL RV PACING AMPLITUDE: 3.5 V

## 2018-02-11 NOTE — Progress Notes (Signed)
Wound check appointment. Steri-strips removed. Wound without redness. Incision edges approximated. Soft hematoma noted, assessed by GT recommended pt hold ASA 81 until 03/03/2018 and no driving until July 1st or hematoma subsides. Pt and wife educated to call device clinic if hematoma does not start to subside by 03/03/2018. Normal device function. RA threshold noted to be at 1.4V GT aware, no recommendations Ap <1% sensing and impedance WNL. RV sensing, and impedances consistent with implant measurements. Device programmed at 3.5V for extra safety margin until 3 month visit. Histogram distribution appropriate for patient and level of activity. No mode switches or ventricular arrhythmias noted. Patient educated about wound care, arm mobility, lifting restrictions, shock plan. ROV 05/15/2018 with GT
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
## 2018-02-13 ENCOUNTER — Ambulatory Visit: Payer: Medicare Other

## 2018-03-17 NOTE — Progress Notes (Signed)
Cardiology Office Note   Date:  03/19/2018   ID:  [name], DOB [birth year], MRN [id number]  PCP:  Joette Catching, MD  Cardiologist:   Rollene Rotunda, MD    Chief Complaint  Patient presents with  . Cardiomyopathy      History of Present Illness: [name] is a 52 y.o. male who presents who presents for followup coronary disease and cardiomyopathy. He's had very difficult to control hypertension. Most recent echocardiogram demonstrate his EF is remaining low at 20%.  Since I last saw him had an ICD placed.    He says that he is doing well.  The patient denies any new symptoms such as chest discomfort, neck or arm discomfort. There has been no new shortness of breath, PND or orthopnea. There have been no reported palpitations, presyncope or syncope.   Past Medical History:  Diagnosis Date  . Anemia    a. 2008 in setting of profound epistaxis.  Marland Kitchen CAD (coronary artery disease)    a. NSTEMI 2008: in setting of profound epistaxis/anemia, cath showing severe diffuse RCA dz with L-R collaterals, nonobst dz in left system, managed medically. b. Type II NSTEMI 10/2008. c. Last cath 2012 - for med rx. d. 05/2013: elevated troponin ? due to ARF.  e. Ranexa added 03/2012.  . Cardiomyopathy (HCC)    a. Mixed ICM/NICM - Prior EF 25% back to 2008. b. Improved to 55% in 2012. c. Subsequent echoes: 45% in 11/2010, 15% by echo 05/2013.and 2016  . CHF (congestive heart failure) (HCC)   . Chronic kidney disease    a. Average creatine of about 2.3  . Epistaxis    a. 2008, 2009 a/w significant anemia.  Marland Kitchen History of pneumonia 08/2012, 05/2013  . Hyperlipidemia   . Hypertension    a. Difficult to control. b. Renal duplex neg for RAS 11/2012.  Marland Kitchen SVT (supraventricular tachycardia) (HCC)    a. 05/2013 tx with 6mg  adenosine in ER.    Past Surgical History:  Procedure Laterality Date  . CARDIAC CATHETERIZATION  2012   total RCA occlusion with left-to-right collaterals supplying the distal  RCA branches, mild diffuse nonobstructive disease of LAD and LCx. Medically managed  . ICD IMPLANT N/A 01/30/2018   Procedure: ICD IMPLANT;  Surgeon: Marinus Maw, MD;  Location: [facility] INVASIVE CV LAB;  Service: Cardiovascular;  Laterality: N/A;     Current Outpatient Prescriptions  Medication Sig Dispense Refill  . aspirin 325 MG EC tablet Take 325 mg by mouth daily.    Marland Kitchen atorvastatin (LIPITOR) 40 MG tablet TAKE 1 TABLET BY MOUTH DAILY AT 6PM. PLEASE SCHEDULE FOLLOW UP APPOINTMENT 30 tablet 0  . calcitRIOL (ROCALTROL) 0.25 MCG capsule Take 0.25 mcg by mouth 3 (three) times a week. On Monday, Wednesday, and Friday.    . carvedilol (COREG) 25 MG tablet TAKE 2 TABLETS BY MOUTH TWO TIMES DAILY WITH A MEAL 120 tablet 4  . cholecalciferol (VITAMIN D) 1000 UNITS tablet Take 1,000 Units by mouth daily.    . Febuxostat (ULORIC) 80 MG TABS Take 80 mg by mouth daily.    . hydrALAZINE (APRESOLINE) 100 MG tablet Take 1 tablet (100 mg total) by mouth daily. 90 tablet 1  . HYDROcodone-acetaminophen (NORCO/VICODIN) 5-325 MG per tablet Take 1 tablet by mouth every 4 (four) hours as needed for moderate pain. 15 tablet 0  . isosorbide mononitrate (IMDUR) 60 MG 24 hr tablet TAKE ONE TABLET BY MOUTH EVERY DAY PATIENT NEEDS OFFICE VISIT* 30 tablet 0  .  nitroGLYCERIN (NITROSTAT) 0.4 MG SL tablet Place 1 tablet (0.4 mg total) under the tongue every 5 (five) minutes as needed for chest pain. KEEP OV. 25 tablet 0  . potassium chloride SA (K-DUR,KLOR-CON) 20 MEQ tablet TAKE 1 TABLET BY MOUTH TWO TIMES DAILY 60 tablet 4  . RANEXA 500 MG 12 hr tablet TAKE 1 TABLET BY MOUTH TWO TIMES DAILY 60 tablet 4  . torsemide (DEMADEX) 20 MG tablet Take 20 mg by mouth 2 (two) times daily.     No current facility-administered medications for this visit.     Allergies:   Patient has no known allergies.    ROS:  Please see the history of present illness.   Otherwise, review of systems are positive for none.   All other systems are  reviewed and negative.    PHYSICAL EXAM: VS:  BP 110/82   Pulse 60   Ht 5\' 10"  (1.778 m)   Wt 209 lb (94.8 kg)   BMI 29.99 kg/m  , BMI Body mass index is 29.99 kg/m.  GENERAL:  Well appearing NECK:  No jugular venous distention, waveform within normal limits, carotid upstroke brisk and symmetric, no bruits, no thyromegaly LUNGS:  Clear to auscultation bilaterally CHEST:  His ICD had a small retained stitch which I removed.  HEART:  PMI not displaced or sustained,S1 and S2 within normal limits, no S3, no S4, no clicks, no rubs, no murmurs ABD:  Flat, positive bowel sounds normal in frequency in pitch, no bruits, no rebound, no guarding, no midline pulsatile mass, no hepatomegaly, no splenomegaly EXT:  2 plus pulses throughout, no edema, no cyanosis no clubbing     EKG:  EKG is not ordered today.    Recent Labs: 01/02/2018: ALT 12; TSH 3.314 01/03/2018: B Natriuretic Peptide 2,180.0; Magnesium 1.8 01/20/2018: BUN 64; Creatinine, Ser 3.51; Hemoglobin 12.4; Platelets 303; Potassium 4.0; Sodium 135    Lipid Panel    Wt Readings from Last 3 Encounters:  03/19/18 209 lb (94.8 kg)  01/31/18 198 lb (89.8 kg)  01/29/18 200 lb 3.2 oz (90.8 kg)      Other studies Reviewed: Additional studies/ records that were reviewed today include: None Review of the above records demonstrates:   ASSESSMENT AND PLAN:  CAD -  The patient has no new sypmtoms.  No further cardiovascular testing is indicated.  We will continue with aggressive risk reduction and meds as listed.  CHRONIC SYSTOLIC HEART FAILURE -  I have been trying to get him to take his hydralazine more frequently and I reiterated again today that they should be twice daily at least.  CHRONIC KIDNEY DISEASE UNSPECIFIED - His last creatinine was 3.51.  He has an appointment with nephrology next week.  HYPERTENSION - The blood pressure is at target. No change in medications is indicated. We will continue with therapeutic lifestyle  changes (TLC).  This is being managed in the context of treating his CHF  Current medicines are reviewed at length with the patient today.  The patient does not have concerns regarding medicines.  The following changes have been made:  As above  Labs/ tests ordered today include:  None  No orders of the defined types were placed in this encounter.    Disposition:   FU with me in 6 months.     Signed, Rollene Rotunda, MD  03/19/2018 1:28 PM    [facility]

## 2018-03-19 ENCOUNTER — Ambulatory Visit (INDEPENDENT_AMBULATORY_CARE_PROVIDER_SITE_OTHER): Payer: Medicare Other | Admitting: Cardiology

## 2018-03-19 ENCOUNTER — Encounter: Payer: Self-pay | Admitting: Cardiology

## 2018-03-19 VITALS — BP 110/82 | HR 60 | Ht 70.0 in | Wt 209.0 lb

## 2018-03-19 DIAGNOSIS — N184 Chronic kidney disease, stage 4 (severe): Secondary | ICD-10-CM | POA: Diagnosis not present

## 2018-03-19 DIAGNOSIS — I5022 Chronic systolic (congestive) heart failure: Secondary | ICD-10-CM

## 2018-03-19 DIAGNOSIS — I10 Essential (primary) hypertension: Secondary | ICD-10-CM | POA: Diagnosis not present

## 2018-03-19 DIAGNOSIS — I2510 Atherosclerotic heart disease of native coronary artery without angina pectoris: Secondary | ICD-10-CM | POA: Diagnosis not present

## 2018-03-19 MED ORDER — HYDRALAZINE HCL 25 MG PO TABS: 25.0000 mg | ORAL_TABLET | Freq: Two times a day (BID) | ORAL | 2 refills | Status: DC

## 2018-03-19 NOTE — Patient Instructions (Signed)
Medication Instructions:  Please have Hydralazine twice a day. Continue all other medications as listed.  Follow-Up: Follow up in 6 months with Dr. Antoine Poche.  You will receive a letter in the mail 2 months before you are due.  Please call us when you receive this letter to schedule your follow up appointment.  If you need a refill on your cardiac medications before your next appointment, please call your pharmacy.  Thank you for choosing [facility]!!

## 2018-03-20 ENCOUNTER — Telehealth: Payer: Self-pay | Admitting: Cardiology

## 2018-03-20 NOTE — Telephone Encounter (Signed)
New Message    Pharmacy is calling on behalf of patient to confirm what medication patient should be taking. She states that he has not picked up any medication in awhile. Please call to discuss.

## 2018-03-20 NOTE — Telephone Encounter (Signed)
Spoke with [facility] pharmacy to very medication pt is currently taking. Informed updated Hydralazine was sent to [facility] in [place]. Phone number provided to rep.

## 2018-04-09 ENCOUNTER — Other Ambulatory Visit: Payer: Self-pay | Admitting: Cardiology

## 2018-05-05 ENCOUNTER — Other Ambulatory Visit: Payer: Self-pay | Admitting: Cardiology

## 2018-05-07 ENCOUNTER — Encounter: Payer: Medicare Other | Admitting: Internal Medicine

## 2018-05-15 ENCOUNTER — Ambulatory Visit (INDEPENDENT_AMBULATORY_CARE_PROVIDER_SITE_OTHER): Payer: Medicare Other | Admitting: Internal Medicine

## 2018-05-15 ENCOUNTER — Other Ambulatory Visit: Payer: Self-pay | Admitting: *Deleted

## 2018-05-15 ENCOUNTER — Encounter: Payer: Self-pay | Admitting: Internal Medicine

## 2018-05-15 VITALS — BP 150/88 | HR 70 | Ht 70.0 in | Wt 203.0 lb

## 2018-05-15 DIAGNOSIS — I2510 Atherosclerotic heart disease of native coronary artery without angina pectoris: Secondary | ICD-10-CM | POA: Diagnosis not present

## 2018-05-15 DIAGNOSIS — I5022 Chronic systolic (congestive) heart failure: Secondary | ICD-10-CM | POA: Diagnosis not present

## 2018-05-15 MED ORDER — CARVEDILOL 25 MG PO TABS: 25.0000 mg | ORAL_TABLET | Freq: Two times a day (BID) | ORAL | 3 refills | Status: AC

## 2018-05-15 NOTE — Progress Notes (Signed)
HPI [name] returns today for followup of his ICD, 3 months after implant. He is a pleasant 52 yo man with chronic systolic heart failure, CAD, s/p MI, VT and SVT. He is s/p ICD insertion about 3 months ago. In the interim, he has had minimal soreness at his ICD insertion site.  No Known Allergies   Current Outpatient Medications  Medication Sig Dispense Refill  . amiodarone (PACERONE) 200 MG tablet Take 200 mg by mouth daily.    Marland Kitchen aspirin 81 MG tablet Take 1 tablet (81 mg total) by mouth daily.    . budesonide-formoterol (SYMBICORT) 160-4.5 MCG/ACT inhaler Inhale 2 puffs into the lungs 2 (two) times daily.    . calcitRIOL (ROCALTROL) 0.25 MCG capsule Take 0.25 mcg by mouth 3 (three) times a week. On Monday, Wednesday, and Friday.    . carvedilol (COREG) 25 MG tablet Take 1 tablet (25 mg total) by mouth 2 (two) times daily with a meal. 60 tablet 2  . cholecalciferol (VITAMIN D) 1000 UNITS tablet Take 1,000 Units by mouth daily.    . Febuxostat (ULORIC) 80 MG TABS Take 1 tablet by mouth daily.    . hydrALAZINE (APRESOLINE) 25 MG tablet Take 1 tablet (25 mg total) by mouth 2 (two) times daily. 180 tablet 2  . isosorbide mononitrate (IMDUR) 60 MG 24 hr tablet Take 1 tablet (60 mg total) by mouth daily. 90 tablet 1  . metolazone (ZAROXOLYN) 5 MG tablet Take 1 tablet by mouth daily.    . nitroGLYCERIN (NITROSTAT) 0.4 MG SL tablet Place 1 tablet (0.4 mg total) under the tongue every 5 (five) minutes as needed for chest pain. KEEP OV. 25 tablet 0  . potassium chloride SA (K-DUR,KLOR-CON) 20 MEQ tablet TAKE ONE TABLET BY MOUTH 2 TIMES A DAY 60 tablet 5  . ranolazine (RANEXA) 500 MG 12 hr tablet TAKE ONE TABLET BY MOUTH 2 TIMES A DAY **CALL OFFICE TO MAKE APPOINTMENT** 60 tablet 2  . torsemide (DEMADEX) 20 MG tablet Take 20 mg by mouth daily.    Marland Kitchen atorvastatin (LIPITOR) 80 MG tablet Take 80 mg by mouth daily.     No current facility-administered medications for this visit.      Past  Medical History:  Diagnosis Date  . Anemia    a. 2008 in setting of profound epistaxis.  Marland Kitchen CAD (coronary artery disease)    a. NSTEMI 2008: in setting of profound epistaxis/anemia, cath showing severe diffuse RCA dz with L-R collaterals, nonobst dz in left system, managed medically. b. Type II NSTEMI 10/2008. c. Last cath 2012 - for med rx. d. 05/2013: elevated troponin ? due to ARF.  e. Ranexa added 03/2012.  . Cardiomyopathy (HCC)    a. Mixed ICM/NICM - Prior EF 25% back to 2008. b. Improved to 55% in 2012. c. Subsequent echoes: 45% in 11/2010, 15% by echo 05/2013.and 2016  . CHF (congestive heart failure) (HCC)   . Chronic kidney disease    a. Average creatine of about 2.3  . Epistaxis    a. 2008, 2009 a/w significant anemia.  Marland Kitchen History of pneumonia 08/2012, 05/2013  . Hyperlipidemia   . Hypertension    a. Difficult to control. b. Renal duplex neg for RAS 11/2012.  Marland Kitchen SVT (supraventricular tachycardia) (HCC)    a. 05/2013 tx with 6mg  adenosine in ER.    ROS:   All systems reviewed and negative except as noted in the HPI.   Past Surgical History:  Procedure  Laterality Date  . CARDIAC CATHETERIZATION  2012   total RCA occlusion with left-to-right collaterals supplying the distal RCA branches, mild diffuse nonobstructive disease of LAD and LCx. Medically managed  . ICD IMPLANT N/A 01/30/2018   Procedure: ICD IMPLANT;  Surgeon: Marinus Maw, MD;  Location: [facility] INVASIVE CV LAB;  Service: Cardiovascular;  Laterality: N/A;     Family History  Problem Relation Age of Onset  . Hypertension Mother        deceased  . Diabetes Mother        deceased  . Heart disease Mother        deceased  . Heart attack Father        at age 53, deceased     Social History   Socioeconomic History  . Marital status: Single    Spouse name: Not on file  . Number of children: 6  . Years of education: Not on file  . Highest education level: Not on file  Occupational History  . Occupation:  Unemployed  Social Needs  . Financial resource strain: Not on file  . Food insecurity:    Worry: Not on file    Inability: Not on file  . Transportation needs:    Medical: Not on file    Non-medical: Not on file  Tobacco Use  . Smoking status: Former Smoker    Packs/day: 1.00    Years: 10.00    Pack years: 10.00    Types: Cigarettes    Last attempt to quit: 11/16/2006    Years since quitting: 11.5  . Smokeless tobacco: Never Used  Substance and Sexual Activity  . Alcohol use: No    Alcohol/week: 0.0 standard drinks  . Drug use: Yes    Comment: Previous marijuana use  . Sexual activity: Not on file  Lifestyle  . Physical activity:    Days per week: Not on file    Minutes per session: Not on file  . Stress: Not on file  Relationships  . Social connections:    Talks on phone: Not on file    Gets together: Not on file    Attends religious service: Not on file    Active member of club or organization: Not on file    Attends meetings of clubs or organizations: Not on file    Relationship status: Not on file  . Intimate partner violence:    Fear of current or ex partner: Not on file    Emotionally abused: Not on file    Physically abused: Not on file    Forced sexual activity: Not on file  Other Topics Concern  . Not on file  Social History Narrative   The patient lives in [place] with his sister. He is legally separated and has 6 healthy children who do not live with him. He is not employed and is trying to get disability.     BP (!) 150/88 (BP Location: Left Arm)   Pulse 70   Ht 5\' 10"  (1.778 m)   Wt 203 lb (92.1 kg)   SpO2 95%   BMI 29.13 kg/m   Physical Exam:  Well appearing 52 yo man, NAD HEENT: Unremarkable Neck:  6 cm JVD, no thyromegally Lymphatics:  No adenopathy Back:  No CVA tenderness Lungs:  Clear with no wheezes HEART:  Regular rate rhythm, no murmurs, no rubs, no clicks Abd:  soft, positive bowel sounds, no organomegally, no rebound, no  guarding Ext:  2 plus pulses, no edema,  no cyanosis, no clubbing Skin:  No rashes no nodules Neuro:  CN II through XII intact, motor grossly intact  DEVICE  Normal device function.  See PaceArt for details.   Assess/Plan: 1. ICD - his [employer] Sci DDD ICD is working normally. 2. VT - he has had only a single episode of NSVT. He will continue amio 200 mg daily for now. I would anticipate weaning the amio down when I see him back. 3. CAD - he denies anginal symptoms. We will follow. 4. Chronic systolic heart failure - his symptoms are class 2. He is encouraged to continue his meds, and maintain a low sodium diet.  Leonia Reeves.D.

## 2018-05-15 NOTE — Patient Instructions (Signed)
Medication Instructions:  Your physician recommends that you continue on your current medications as directed. Please refer to the Current Medication list given to you today.   Labwork: NONE  Testing/Procedures: NONE  Follow-Up: Your physician wants you to follow-up in: 9 MONTHS.  You will receive a reminder letter in the mail two months in advance. If you don't receive a letter, please call our office to schedule the follow-up appointment.   Any Other Special Instructions Will Be Listed Below (If Applicable).     If you need a refill on your cardiac medications before your next appointment, please call your pharmacy.   

## 2018-07-25 ENCOUNTER — Telehealth: Payer: Self-pay | Admitting: *Deleted

## 2018-07-25 NOTE — Telephone Encounter (Signed)
[email or web address]  AFL noted on remote. Needs appt with AF clinic.

## 2018-07-29 NOTE — Telephone Encounter (Signed)
[email or web address]//sss 

## 2018-08-03 ENCOUNTER — Inpatient Hospital Stay (HOSPITAL_COMMUNITY)
Admission: EM | Admit: 2018-08-03 | Discharge: 2018-08-09 | DRG: 286 | Disposition: A | Discharge status: Home / Self Care | Payer: Medicare Other | Attending: Internal Medicine | Admitting: Internal Medicine

## 2018-08-03 ENCOUNTER — Emergency Department (HOSPITAL_COMMUNITY): Payer: Medicare Other

## 2018-08-03 ENCOUNTER — Encounter (HOSPITAL_COMMUNITY): Payer: Self-pay | Admitting: Emergency Medicine

## 2018-08-03 DIAGNOSIS — Z79899 Other long term (current) drug therapy: Secondary | ICD-10-CM | POA: Diagnosis not present

## 2018-08-03 DIAGNOSIS — I5043 Acute on chronic combined systolic (congestive) and diastolic (congestive) heart failure: Secondary | ICD-10-CM | POA: Diagnosis not present

## 2018-08-03 DIAGNOSIS — I130 Hypertensive heart and chronic kidney disease with heart failure and stage 1 through stage 4 chronic kidney disease, or unspecified chronic kidney disease: Principal | ICD-10-CM | POA: Diagnosis present

## 2018-08-03 DIAGNOSIS — Z8249 Family history of ischemic heart disease and other diseases of the circulatory system: Secondary | ICD-10-CM

## 2018-08-03 DIAGNOSIS — R7989 Other specified abnormal findings of blood chemistry: Secondary | ICD-10-CM

## 2018-08-03 DIAGNOSIS — I5042 Chronic combined systolic (congestive) and diastolic (congestive) heart failure: Secondary | ICD-10-CM | POA: Diagnosis present

## 2018-08-03 DIAGNOSIS — I272 Pulmonary hypertension, unspecified: Secondary | ICD-10-CM | POA: Diagnosis present

## 2018-08-03 DIAGNOSIS — N289 Disorder of kidney and ureter, unspecified: Secondary | ICD-10-CM

## 2018-08-03 DIAGNOSIS — I252 Old myocardial infarction: Secondary | ICD-10-CM | POA: Diagnosis not present

## 2018-08-03 DIAGNOSIS — N179 Acute kidney failure, unspecified: Secondary | ICD-10-CM | POA: Diagnosis present

## 2018-08-03 DIAGNOSIS — Z7982 Long term (current) use of aspirin: Secondary | ICD-10-CM | POA: Diagnosis not present

## 2018-08-03 DIAGNOSIS — Z95810 Presence of automatic (implantable) cardiac defibrillator: Secondary | ICD-10-CM

## 2018-08-03 DIAGNOSIS — E1122 Type 2 diabetes mellitus with diabetic chronic kidney disease: Secondary | ICD-10-CM | POA: Diagnosis present

## 2018-08-03 DIAGNOSIS — N189 Chronic kidney disease, unspecified: Secondary | ICD-10-CM | POA: Diagnosis not present

## 2018-08-03 DIAGNOSIS — E785 Hyperlipidemia, unspecified: Secondary | ICD-10-CM | POA: Diagnosis present

## 2018-08-03 DIAGNOSIS — I509 Heart failure, unspecified: Secondary | ICD-10-CM

## 2018-08-03 DIAGNOSIS — Z833 Family history of diabetes mellitus: Secondary | ICD-10-CM

## 2018-08-03 DIAGNOSIS — N183 Chronic kidney disease, stage 3 unspecified: Secondary | ICD-10-CM | POA: Diagnosis present

## 2018-08-03 DIAGNOSIS — I5023 Acute on chronic systolic (congestive) heart failure: Secondary | ICD-10-CM

## 2018-08-03 DIAGNOSIS — I472 Ventricular tachycardia: Secondary | ICD-10-CM | POA: Diagnosis present

## 2018-08-03 DIAGNOSIS — Z87891 Personal history of nicotine dependence: Secondary | ICD-10-CM | POA: Diagnosis not present

## 2018-08-03 DIAGNOSIS — R778 Other specified abnormalities of plasma proteins: Secondary | ICD-10-CM

## 2018-08-03 DIAGNOSIS — I255 Ischemic cardiomyopathy: Secondary | ICD-10-CM | POA: Diagnosis present

## 2018-08-03 DIAGNOSIS — I248 Other forms of acute ischemic heart disease: Secondary | ICD-10-CM | POA: Diagnosis present

## 2018-08-03 DIAGNOSIS — Z7951 Long term (current) use of inhaled steroids: Secondary | ICD-10-CM

## 2018-08-03 DIAGNOSIS — I2510 Atherosclerotic heart disease of native coronary artery without angina pectoris: Secondary | ICD-10-CM | POA: Diagnosis present

## 2018-08-03 DIAGNOSIS — I10 Essential (primary) hypertension: Secondary | ICD-10-CM | POA: Diagnosis present

## 2018-08-03 DIAGNOSIS — Z560 Unemployment, unspecified: Secondary | ICD-10-CM

## 2018-08-03 DIAGNOSIS — R079 Chest pain, unspecified: Secondary | ICD-10-CM | POA: Diagnosis not present

## 2018-08-03 DIAGNOSIS — N184 Chronic kidney disease, stage 4 (severe): Secondary | ICD-10-CM | POA: Diagnosis present

## 2018-08-03 DIAGNOSIS — D631 Anemia in chronic kidney disease: Secondary | ICD-10-CM

## 2018-08-03 DIAGNOSIS — I428 Other cardiomyopathies: Secondary | ICD-10-CM | POA: Diagnosis present

## 2018-08-03 DIAGNOSIS — I4892 Unspecified atrial flutter: Secondary | ICD-10-CM | POA: Diagnosis not present

## 2018-08-03 DIAGNOSIS — R0600 Dyspnea, unspecified: Secondary | ICD-10-CM | POA: Diagnosis present

## 2018-08-03 DIAGNOSIS — I340 Nonrheumatic mitral (valve) insufficiency: Secondary | ICD-10-CM | POA: Diagnosis not present

## 2018-08-03 DIAGNOSIS — I25119 Atherosclerotic heart disease of native coronary artery with unspecified angina pectoris: Secondary | ICD-10-CM | POA: Diagnosis not present

## 2018-08-03 HISTORY — DX: Chronic kidney disease, stage 4 (severe): N18.4

## 2018-08-03 HISTORY — DX: Presence of automatic (implantable) cardiac defibrillator: Z95.810

## 2018-08-03 HISTORY — DX: Other ventricular tachycardia: I47.29

## 2018-08-03 HISTORY — DX: Unspecified systolic (congestive) heart failure: I50.20

## 2018-08-03 HISTORY — DX: Ventricular tachycardia: I47.2

## 2018-08-03 LAB — BASIC METABOLIC PANEL
ANION GAP: 12 (ref 5–15)
BUN: 53 mg/dL — AB (ref 6–20)
CHLORIDE: 99 mmol/L (ref 98–111)
CO2: 27 mmol/L (ref 22–32)
Calcium: 8.3 mg/dL — ABNORMAL LOW (ref 8.9–10.3)
Creatinine, Ser: 3.8 mg/dL — ABNORMAL HIGH (ref 0.61–1.24)
GFR, EST AFRICAN AMERICAN: 20 mL/min — AB (ref >60)
GFR, EST NON AFRICAN AMERICAN: 17 mL/min — AB (ref >60)
Glucose, Bld: 164 mg/dL — ABNORMAL HIGH (ref 70–99)
Potassium: 3.8 mmol/L (ref 3.5–5.1)
SODIUM: 138 mmol/L (ref 135–145)

## 2018-08-03 LAB — CBC WITH DIFFERENTIAL/PLATELET
ABS IMMATURE GRANULOCYTES: 0.03 10*3/uL (ref 0.00–0.07)
Basophils Absolute: 0 10*3/uL (ref 0.0–0.1)
Basophils Relative: 1 %
Eosinophils Absolute: 0.2 10*3/uL (ref 0.0–0.5)
Eosinophils Relative: 3 %
HCT: 36.2 % — ABNORMAL LOW (ref 39.0–52.0)
Hemoglobin: 10.7 g/dL — ABNORMAL LOW (ref 13.0–17.0)
Immature Granulocytes: 0 %
Lymphocytes Relative: 18 %
Lymphs Abs: 1.2 10*3/uL (ref 0.7–4.0)
MCH: 27.4 pg (ref 26.0–34.0)
MCHC: 29.6 g/dL — ABNORMAL LOW (ref 30.0–36.0)
MCV: 92.8 fL (ref 80.0–100.0)
MONO ABS: 0.5 10*3/uL (ref 0.1–1.0)
Monocytes Relative: 7 %
Neutro Abs: 4.8 10*3/uL (ref 1.7–7.7)
Neutrophils Relative %: 71 %
Platelets: 204 10*3/uL (ref 150–400)
RBC: 3.9 MIL/uL — ABNORMAL LOW (ref 4.22–5.81)
RDW: 14.4 % (ref 11.5–15.5)
WBC: 6.8 10*3/uL (ref 4.0–10.5)
nRBC: 0 % (ref 0.0–0.2)

## 2018-08-03 LAB — I-STAT TROPONIN, ED
TROPONIN I, POC: 0.21 ng/mL — AB (ref 0.00–0.08)
Troponin i, poc: 0.26 ng/mL (ref 0.00–0.08)

## 2018-08-03 LAB — PROTIME-INR
INR: 1.5
Prothrombin Time: 17.9 seconds — ABNORMAL HIGH (ref 11.4–15.2)

## 2018-08-03 LAB — BRAIN NATRIURETIC PEPTIDE: B Natriuretic Peptide: 3686.6 pg/mL — ABNORMAL HIGH (ref 0.0–100.0)

## 2018-08-03 LAB — TROPONIN I
Troponin I: 0.14 ng/mL (ref <0.03)
Troponin I: 0.15 ng/mL (ref <0.03)

## 2018-08-03 MED ORDER — FEBUXOSTAT 40 MG PO TABS
80.0000 mg | ORAL_TABLET | Freq: Every day | ORAL | Status: DC
  Administered 2018-08-03 – 2018-08-09 (×7): 80 mg via ORAL
  Filled 2018-08-03 (×8): qty 2

## 2018-08-03 MED ORDER — NITROGLYCERIN 0.4 MG SL SUBL
SUBLINGUAL_TABLET | SUBLINGUAL | Status: AC
  Administered 2018-08-03: 0.4 mg
  Filled 2018-08-03: qty 1

## 2018-08-03 MED ORDER — ACETAMINOPHEN 650 MG RE SUPP: 650.0000 mg | Freq: Four times a day (QID) | RECTAL | Status: DC | PRN

## 2018-08-03 MED ORDER — VITAMIN D 25 MCG (1000 UNIT) PO TABS
1000.0000 [IU] | ORAL_TABLET | Freq: Every day | ORAL | Status: DC
  Administered 2018-08-03 – 2018-08-09 (×7): 1000 [IU] via ORAL
  Filled 2018-08-03 (×7): qty 1

## 2018-08-03 MED ORDER — CARVEDILOL 25 MG PO TABS
25.0000 mg | ORAL_TABLET | Freq: Two times a day (BID) | ORAL | Status: DC
  Administered 2018-08-03 – 2018-08-07 (×9): 25 mg via ORAL
  Filled 2018-08-03 (×10): qty 1

## 2018-08-03 MED ORDER — POTASSIUM CHLORIDE CRYS ER 20 MEQ PO TBCR
20.0000 meq | EXTENDED_RELEASE_TABLET | Freq: Two times a day (BID) | ORAL | Status: DC
  Administered 2018-08-03 – 2018-08-04 (×3): 20 meq via ORAL
  Filled 2018-08-03 (×3): qty 1

## 2018-08-03 MED ORDER — NITROGLYCERIN 0.4 MG SL SUBL
0.4000 mg | SUBLINGUAL_TABLET | SUBLINGUAL | Status: DC | PRN
  Administered 2018-08-03: 0.4 mg via SUBLINGUAL
  Filled 2018-08-03: qty 1

## 2018-08-03 MED ORDER — HYDRALAZINE HCL 25 MG PO TABS
25.0000 mg | ORAL_TABLET | Freq: Three times a day (TID) | ORAL | Status: DC
  Administered 2018-08-03 – 2018-08-06 (×9): 25 mg via ORAL
  Filled 2018-08-03 (×9): qty 1

## 2018-08-03 MED ORDER — RANOLAZINE ER 500 MG PO TB12
500.0000 mg | ORAL_TABLET | Freq: Two times a day (BID) | ORAL | Status: DC
  Administered 2018-08-03 – 2018-08-09 (×13): 500 mg via ORAL
  Filled 2018-08-03 (×13): qty 1

## 2018-08-03 MED ORDER — AMIODARONE HCL 200 MG PO TABS
200.0000 mg | ORAL_TABLET | Freq: Every day | ORAL | Status: DC
  Administered 2018-08-03 – 2018-08-09 (×7): 200 mg via ORAL
  Filled 2018-08-03 (×7): qty 1

## 2018-08-03 MED ORDER — MOMETASONE FURO-FORMOTEROL FUM 200-5 MCG/ACT IN AERO
2.0000 | INHALATION_SPRAY | Freq: Two times a day (BID) | RESPIRATORY_TRACT | Status: DC
  Administered 2018-08-03 – 2018-08-09 (×11): 2 via RESPIRATORY_TRACT
  Filled 2018-08-03: qty 8.8

## 2018-08-03 MED ORDER — ASPIRIN EC 81 MG PO TBEC
81.0000 mg | DELAYED_RELEASE_TABLET | Freq: Every day | ORAL | Status: DC
  Administered 2018-08-03 – 2018-08-06 (×4): 81 mg via ORAL
  Filled 2018-08-03 (×4): qty 1

## 2018-08-03 MED ORDER — FUROSEMIDE 10 MG/ML IJ SOLN
40.0000 mg | Freq: Once | INTRAMUSCULAR | Status: AC
  Administered 2018-08-03: 40 mg via INTRAVENOUS
  Filled 2018-08-03: qty 4

## 2018-08-03 MED ORDER — IPRATROPIUM-ALBUTEROL 0.5-2.5 (3) MG/3ML IN SOLN
3.0000 mL | Freq: Once | RESPIRATORY_TRACT | Status: AC
  Administered 2018-08-03: 3 mL via RESPIRATORY_TRACT
  Filled 2018-08-03: qty 3

## 2018-08-03 MED ORDER — ONDANSETRON HCL 4 MG/2ML IJ SOLN: 4.0000 mg | Freq: Four times a day (QID) | INTRAMUSCULAR | Status: DC | PRN

## 2018-08-03 MED ORDER — CALCITRIOL 0.25 MCG PO CAPS
0.2500 ug | ORAL_CAPSULE | ORAL | Status: DC
  Administered 2018-08-04 – 2018-08-08 (×3): 0.25 ug via ORAL
  Filled 2018-08-03 (×4): qty 1

## 2018-08-03 MED ORDER — ISOSORBIDE MONONITRATE ER 60 MG PO TB24
60.0000 mg | ORAL_TABLET | Freq: Every day | ORAL | Status: DC
  Administered 2018-08-03 – 2018-08-07 (×5): 60 mg via ORAL
  Filled 2018-08-03 (×5): qty 1

## 2018-08-03 MED ORDER — HYDRALAZINE HCL 25 MG PO TABS
25.0000 mg | ORAL_TABLET | Freq: Two times a day (BID) | ORAL | Status: DC
  Administered 2018-08-03: 25 mg via ORAL
  Filled 2018-08-03: qty 1

## 2018-08-03 MED ORDER — ONDANSETRON HCL 4 MG PO TABS: 4.0000 mg | ORAL_TABLET | Freq: Four times a day (QID) | ORAL | Status: DC | PRN

## 2018-08-03 MED ORDER — ALUM & MAG HYDROXIDE-SIMETH 200-200-20 MG/5ML PO SUSP
30.0000 mL | ORAL | Status: DC | PRN
  Administered 2018-08-03 – 2018-08-06 (×2): 30 mL via ORAL
  Filled 2018-08-03 (×2): qty 30

## 2018-08-03 MED ORDER — HEPARIN SODIUM (PORCINE) 5000 UNIT/ML IJ SOLN
5000.0000 [IU] | Freq: Three times a day (TID) | INTRAMUSCULAR | Status: DC
  Administered 2018-08-03 – 2018-08-06 (×7): 5000 [IU] via SUBCUTANEOUS
  Filled 2018-08-03 (×8): qty 1

## 2018-08-03 MED ORDER — ATORVASTATIN CALCIUM 80 MG PO TABS
80.0000 mg | ORAL_TABLET | Freq: Every day | ORAL | Status: DC
  Administered 2018-08-03 – 2018-08-09 (×7): 80 mg via ORAL
  Filled 2018-08-03 (×7): qty 1

## 2018-08-03 MED ORDER — ACETAMINOPHEN 325 MG PO TABS: 650.0000 mg | ORAL_TABLET | Freq: Four times a day (QID) | ORAL | Status: DC | PRN

## 2018-08-03 MED ORDER — NITROGLYCERIN 0.4 MG SL SUBL
0.4000 mg | SUBLINGUAL_TABLET | SUBLINGUAL | Status: DC | PRN
  Administered 2018-08-03: 0.4 mg via SUBLINGUAL

## 2018-08-03 MED ORDER — FUROSEMIDE 10 MG/ML IJ SOLN
80.0000 mg | Freq: Two times a day (BID) | INTRAMUSCULAR | Status: DC
  Administered 2018-08-03 – 2018-08-04 (×3): 80 mg via INTRAVENOUS
  Filled 2018-08-03 (×3): qty 8

## 2018-08-03 MED ORDER — MORPHINE SULFATE (PF) 2 MG/ML IV SOLN
2.0000 mg | INTRAVENOUS | Status: DC | PRN
  Administered 2018-08-03: 2 mg via INTRAVENOUS
  Filled 2018-08-03: qty 1

## 2018-08-03 NOTE — Progress Notes (Signed)
Patient calls to desk to c/o chest pain. Pt refuses to let staff take VS, pt will not answer staff questions to further describe/identify the chest pain.  Pt demands pain medication but without supplementing his CP complaint we cannot assume it is one type or another.   Unable to safely admin nitro due to pt refusal of VS. Pt curses at staff insisting the issue is this floor, pt advised staff will not argue about protocol and processes/pt states he isn't arguing as he proceeds to demand but not communicate with staff about his complaint.   Pt is otherwise sitting on edge of bed, mid-meal, not diaphoretic, refusing to identify even a left/right/center location of the pain.  CN aware.

## 2018-08-03 NOTE — ED Notes (Addendum)
Dr. Preston Fleeting ( EDP) notified on pt.'s elevated Troponin result.

## 2018-08-03 NOTE — ED Provider Notes (Signed)
[facility] EMERGENCY DEPARTMENT Provider Note   CSN: [id number] Arrival date & time: 08/03/18  0254     History   Chief Complaint Chief Complaint  Patient presents with  . Chest Pain    HPI [name] is a 52 y.o. male.  The history is provided by the patient.  He has history of hypertension, hyperlipidemia, chronic kidney disease, congestive heart failure with cardiomyopathy, coronary artery disease and comes in because of sharp chest pain which started at 1:30 AM.  Pain is in the upper sternal area with radiation to both shoulders.  It is worse when he coughs.  He states he has had a harsh cough for the last 3 days.  Cough is productive of a large amount of clear to white sputum.  There has been associated dyspnea.  He has had subjective fevers as well as chills and sweats.  He denies nausea or vomiting.  He took 650 mg of aspirin at home, and EMS gave him an additional 162 mg of aspirin.  He currently rates his pain at 8/10.  Past Medical History:  Diagnosis Date  . Anemia    a. 2008 in setting of profound epistaxis.  Marland Kitchen CAD (coronary artery disease)    a. NSTEMI 2008: in setting of profound epistaxis/anemia, cath showing severe diffuse RCA dz with L-R collaterals, nonobst dz in left system, managed medically. b. Type II NSTEMI 10/2008. c. Last cath 2012 - for med rx. d. 05/2013: elevated troponin ? due to ARF.  e. Ranexa added 03/2012.  . Cardiomyopathy (HCC)    a. Mixed ICM/NICM - Prior EF 25% back to 2008. b. Improved to 55% in 2012. c. Subsequent echoes: 45% in 11/2010, 15% by echo 05/2013.and 2016  . CHF (congestive heart failure) (HCC)   . Chronic kidney disease    a. Average creatine of about 2.3  . Epistaxis    a. 2008, 2009 a/w significant anemia.  Marland Kitchen History of pneumonia 08/2012, 05/2013  . Hyperlipidemia   . Hypertension    a. Difficult to control. b. Renal duplex neg for RAS 11/2012.  Marland Kitchen SVT (supraventricular tachycardia) (HCC)    a. 05/2013 tx with  6mg  adenosine in ER.    Patient Active Problem List   Diagnosis Date Noted  . Coronary artery disease involving native coronary artery of native heart without angina pectoris 03/19/2018  . Chronic systolic (congestive) heart failure (HCC) 01/30/2018  . Acute systolic (congestive) heart failure (HCC) 12/30/2017  . Hypokalemia 12/30/2017  . 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 Hypertensive heart disease 07/24/2013  . Acute respiratory failure (HCC) 07/24/2013  . Hypertensive urgency 07/24/2013  . Chronic combined systolic and diastolic CHF (congestive heart failure) (HCC) 05/11/2013  . SVT (supraventricular tachycardia) (HCC) 05/11/2013  . Shortness of breath 08/28/2012  . Sinus bradycardia 03/03/2012  . CKD (chronic kidney disease), stage III (HCC) 03/03/2012  . CAP (community acquired pneumonia) 11/16/2011  . Chest pain 11/16/2011  . Hyperlipemia 11/22/2008  . ANEMIA 11/22/2008  . Hypertension 11/22/2008  . Coronary atherosclerosis 11/22/2008    Past Surgical History:  Procedure Laterality Date  . CARDIAC CATHETERIZATION  2012   total RCA occlusion with left-to-right collaterals supplying the distal RCA branches, mild diffuse nonobstructive disease of LAD and LCx. Medically managed  . ICD IMPLANT N/A 01/30/2018   Procedure: ICD IMPLANT;  Surgeon: Marinus Maw, MD;  Location: [facility] INVASIVE CV LAB;  Service: Cardiovascular;  Laterality: N/A;        Home Medications  Prior to Admission medications   Medication Sig Start Date End Date Taking? Authorizing Provider  amiodarone (PACERONE) 200 MG tablet Take 200 mg by mouth daily.    [provider]  aspirin 81 MG tablet Take 1 tablet (81 mg total) by mouth daily. 11/21/16   Rollene Rotunda, MD  atorvastatin (LIPITOR) 80 MG tablet Take 80 mg by mouth daily. 12/26/16 03/19/18  [provider]  budesonide-formoterol (SYMBICORT) 160-4.5 MCG/ACT inhaler Inhale 2 puffs into the lungs 2 (two) times daily. 07/04/17    [provider]  calcitRIOL (ROCALTROL) 0.25 MCG capsule Take 0.25 mcg by mouth 3 (three) times a week. On Monday, Wednesday, and Friday. 11/24/13   [provider]  carvedilol (COREG) 25 MG tablet Take 1 tablet (25 mg total) by mouth 2 (two) times daily with a meal. 05/15/18   Rollene Rotunda, MD  cholecalciferol (VITAMIN D) 1000 UNITS tablet Take 1,000 Units by mouth daily.    [provider]  Febuxostat (ULORIC) 80 MG TABS Take 1 tablet by mouth daily. 12/17/16   [provider]  hydrALAZINE (APRESOLINE) 25 MG tablet Take 1 tablet (25 mg total) by mouth 2 (two) times daily. 03/19/18   Rollene Rotunda, MD  isosorbide mononitrate (IMDUR) 60 MG 24 hr tablet Take 1 tablet (60 mg total) by mouth daily. 05/07/18   Rollene Rotunda, MD  metolazone (ZAROXOLYN) 5 MG tablet Take 1 tablet by mouth daily. 01/09/18   [provider]  nitroGLYCERIN (NITROSTAT) 0.4 MG SL tablet Place 1 tablet (0.4 mg total) under the tongue every 5 (five) minutes as needed for chest pain. KEEP OV. 07/11/16   Rollene Rotunda, MD  potassium chloride SA (K-DUR,KLOR-CON) 20 MEQ tablet TAKE ONE TABLET BY MOUTH 2 TIMES A DAY 04/10/18   Rollene Rotunda, MD  ranolazine (RANEXA) 500 MG 12 hr tablet TAKE ONE TABLET BY MOUTH 2 TIMES A DAY **CALL OFFICE TO MAKE APPOINTMENT** 11/01/17   Rollene Rotunda, MD  torsemide (DEMADEX) 20 MG tablet Take 20 mg by mouth daily.    [provider]    Family History Family History  Problem Relation Age of Onset  . Hypertension Mother        deceased  . Diabetes Mother        deceased  . Heart disease Mother        deceased  . Heart attack Father        at age 31, deceased    Social History Social History   Tobacco Use  . Smoking status: Former Smoker    Packs/day: 1.00    Years: 10.00    Pack years: 10.00    Types: Cigarettes    Last attempt to quit: 11/16/2006    Years since quitting: 11.7  . Smokeless tobacco: Never Used  Substance Use Topics   . Alcohol use: No    Alcohol/week: 0.0 standard drinks  . Drug use: Yes    Comment: Previous marijuana use     Allergies   Patient has no known allergies.   Review of Systems Review of Systems  All other systems reviewed and are negative.    Physical Exam Updated Vital Signs BP (!) 141/95   Pulse 62   Temp 97.8 F (36.6 C) (Oral)   Resp 20   SpO2 94%   Physical Exam  Nursing note and vitals reviewed.  52 year old male, resting comfortably and in no acute distress. Vital signs are significant for elevated blood pressure. Oxygen saturation is 94%,  which is normal. Head is normocephalic and atraumatic. PERRLA, EOMI. Oropharynx is clear. Neck is nontender and supple without adenopathy or JVD. Back is nontender and there is no CVA tenderness. Lungs have rales at the right base, diffuse expiratory wheezes.  There are no rhonchi. Chest is nontender. Heart has regular rate and rhythm without murmur. Abdomen is soft, flat, nontender without masses or hepatosplenomegaly and peristalsis is normoactive. Extremities have no cyanosis or edema, full range of motion is present. Skin is warm and dry without rash. Neurologic: Mental status is normal, cranial nerves are intact, there are no motor or sensory deficits.  ED Treatments / Results  Labs (all labs ordered are listed, but only abnormal results are displayed) Labs Reviewed  BASIC METABOLIC PANEL - Abnormal; Notable for the following components:      Result Value   Glucose, Bld 164 (*)    BUN 53 (*)    Creatinine, Ser 3.80 (*)    Calcium 8.3 (*)    GFR calc non Af Amer 17 (*)    GFR calc Af Amer 20 (*)    All other components within normal limits  PROTIME-INR - Abnormal; Notable for the following components:   Prothrombin Time 17.9 (*)    All other components within normal limits  CBC WITH DIFFERENTIAL/PLATELET - Abnormal; Notable for the following components:   RBC 3.90 (*)    Hemoglobin 10.7 (*)    HCT 36.2 (*)     MCHC 29.6 (*)    All other components within normal limits  BRAIN NATRIURETIC PEPTIDE - Abnormal; Notable for the following components:   B Natriuretic Peptide 3,686.6 (*)    All other components within normal limits  I-STAT TROPONIN, ED - Abnormal; Notable for the following components:   Troponin i, poc 0.21 (*)    All other components within normal limits  I-STAT TROPONIN, ED    EKG EKG Interpretation  Date/Time:  Sunday August 03 2018 02:56:40 EST Ventricular Rate:  63 PR Interval:    QRS Duration: 159 QT Interval:  515 QTC Calculation: 528 R Axis:   0 Text Interpretation:  Sinus rhythm Probable left atrial enlargement Nonspecific intraventricular conduction delay Inferior infarct, age indeterminate Anterior infarct, old When compared with ECG of 01/31/2018, Sinus rhythm has replaced AV PACED RHYTHM Confirmed by Dione Booze ([id number]) on 08/03/2018 3:02:37 AM   Radiology Dg Chest 2 View  Result Date: 08/03/2018 CLINICAL DATA:  Initial evaluation for acute chest pain. EXAM: CHEST - 2 VIEW COMPARISON:  Prior radiograph from 01/31/2018 FINDINGS: Left-sided pacemaker/AICD in place. Moderate to severe cardiomegaly, unchanged. Mediastinal silhouette normal. Lungs hypoinflated. Diffuse pulmonary vascular congestion with interstitial prominence, compatible with pulmonary interstitial edema. No appreciable pleural effusion. No consolidative airspace disease. No pneumothorax. No acute osseous abnormality. IMPRESSION: Cardiomegaly with mild to moderate diffuse pulmonary interstitial edema. Electronically Signed   By: Rise Mu M.D.   On: 08/03/2018 04:36    Procedures Procedures   Medications Ordered in ED Medications  morphine 2 MG/ML injection 2 mg (has no administration in time range)  ipratropium-albuterol (DUONEB) 0.5-2.5 (3) MG/3ML nebulizer solution 3 mL (3 mLs Nebulization Given 08/03/18 0325)  furosemide (LASIX) injection 40 mg (40 mg Intravenous Given 08/03/18 0509)      Initial Impression / Assessment and Plan / ED Course  I have reviewed the triage vital signs and the nursing notes.  Pertinent labs & imaging results that were available during my care of the patient were reviewed by me and considered in  my medical decision making (see chart for details).  Cough with physical findings suggestive of pneumonia.  He will be sent for chest x-ray.  With wheezing, will give nebulizer treatment with albuterol and ipratropium.  Old records are reviewed confirming history of cardiomyopathy with recent implantation of AICD.  Chest x-ray is more consistent with CHF exacerbation with worsening pulmonary edema than with pneumonia.  Troponin has come back mildly elevated.  Old records are reviewed, and troponin was actually 0.87 on Jan 20, 2018.  At that time, he left the emergency department AGAINST MEDICAL ADVICE.  BNP has come back over 3000, also consistent with heart failure.  He is given a dose of furosemide intravenously.  Troponins will need to be trended.  Case is discussed with Dr. Clyde Lundborg of [facility], who agrees to admit the patient.  Final Clinical Impressions(s) / ED Diagnoses   Final diagnoses:  Acute on chronic heart failure, unspecified heart failure type (HCC)  Elevated troponin I level  Renal insufficiency  Anemia associated with chronic renal failure    ED Discharge Orders    None       Dione Booze, MD 08/03/18 [phone]

## 2018-08-03 NOTE — Care Management (Signed)
This is a no charge note  Pending admission per Dr. Preston Fleeting  52 year old male with a past medical history of hypertension, hyperlipidemia, CAD, CHF with EF of 15%, CKD, SVT, who presents with shortness of breath and chest pain.  Chest pain has since subsided.  Patient was found to have BNP 3686, troponin 0.21.  Chest x-ray showed pulmonary edema.  Clinically consistent with CHF exacerbation.  40 mg Lasix was given by ED physician.  Patient is admitted to telemetry bed as inpatient.  Lorretta Harp, MD  [facility] Pager [phone]  If 7PM-7AM, please contact night-coverage [email or web address] Password [facility] 08/03/2018, 6:17 AM

## 2018-08-03 NOTE — Consult Note (Addendum)
Cardiology Consult    Patient ID: [name] MRN: [id number], DOB/AGE: 1966/03/12   Admit date: 08/03/2018 Date of Consult: 08/03/2018  Primary Physician: Joette Catching, MD Primary Cardiologist: Rollene Rotunda, MD Requesting Provider: Mitchel Honour, MD  Patient Profile    [name] is a 52 y.o. male with a history of CAD, mixed ICM/NICM, HFrEF, NSVT s/p ICD, CKD IV, HTN, and HL, who is being seen today for the evaluation of chest pain and CHF at the request of Dr. Jomarie Longs.  Past Medical History   Past Medical History:  Diagnosis Date  . AICD (automatic cardioverter/defibrillator) present    a. 01/2018 BSX Vigilant EL dual chamber AICD. Ser# [id number].  Marland Kitchen Anemia    a. 2008 in setting of profound epistaxis.  Marland Kitchen CAD (coronary artery disease)    a. NSTEMI 2008: in setting of profound epistaxis/anemia, cath showing severe diffuse RCA dz with L-R collaterals, nonobst dz in left system, managed medically; b. Type II NSTEMI 10/2008; c. Cath 2012 - for med rx; d. 05/2013: elevated troponin ? due to ARF;  e. Ranexa added 03/2012.  Marland Kitchen CKD (chronic kidney disease), stage IV (HCC)    a. Average creatine of about 2.3  . Epistaxis    a. 2008, 2009 a/w significant anemia.  . HFrEF (heart failure with reduced ejection fraction) (HCC)   . History of pneumonia 08/2012, 05/2013  . Hyperlipidemia   . Hypertension    a. Difficult to control. b. Renal duplex neg for RAS 11/2012.  . Mixed ischemic and nonischemic cardiomyopathy (HCC)    a. Mixed ICM/NICM - Prior EF 25% back to 2008; b. Improved to 55% in 2012; c. Subsequent echoes: 45% in 11/2010, 15% by echo 05/2013 & 2016;  d. 12/2017 Echo: EF 15%, restrictive phys, mild conc and mod focal basal septal LVH. Mild AI. Sev BAE. Sev red RV fxn. Mod tR. PASP .  Marland Kitchen NSVT (nonsustained ventricular tachycardia) (HCC)    a. s/p ICD.  On amio.  Marland Kitchen SVT (supraventricular tachycardia) (HCC)    a. 05/2013 tx with 6mg  adenosine in ER.    Past Surgical History:    Procedure Laterality Date  . CARDIAC CATHETERIZATION  2012   total RCA occlusion with left-to-right collaterals supplying the distal RCA branches, mild diffuse nonobstructive disease of LAD and LCx. Medically managed  . ICD IMPLANT N/A 01/30/2018   Procedure: ICD IMPLANT;  Surgeon: Marinus Maw, MD;  Location: [facility] INVASIVE CV LAB;  Service: Cardiovascular;  Laterality: N/A;     Allergies  No Known Allergies  History of Present Illness    52 year old male with the above complex past medical history including coronary artery disease status post non-STEMI in the setting of profound epistaxis and anemia and 2008.  Catheterization at that time showed severe diffuse right coronary artery disease with left-to-right collaterals and otherwise nonobstructive disease.  He was medically managed.  Other history includes mixed ischemic and nonischemic cardiomyopathy and HFrEF with severe LV dysfunction dating back to at least 2008.  Most recent echo in April 2019 showed an EF of 15% with restrictive physiology.  He also has a history of nonsustained VT and is status post ICD placement and is on chronic amiodarone, hypertension, hyperlipidemia, and stage IV chronic kidney disease followed by nephrology in [place].  He was last seen in general cardiology clinic in July and subsequently electrophysiology clinic in September.  On both occasions, he was doing well.  Patient lives in [place] and says he typically walks frequently  without any symptoms or limitations.  He does not weigh himself at home because he does not have batteries for his scale.  He was in his usual state of health until approximately 3 days ago, when he began to note a nagging cough with pleuritic chest pain.  He also noted increasing dyspnea on exertion and increasing abdominal girth.  This morning, he awoke at approximately 1 AM with more significant chest pain that was worse with coughing and also dyspnea/orthopnea.  He called EMS and was  treated with aspirin and sublingual nitroglycerin.  He was taken to the [facility] ED where ECG showed no acute changes.  His creatinine was slightly elevated above baseline at 3.8.  BNP was elevated at 3686.  Point-of-care troponins were 0.21  0.26, with lab base troponin returning at 0.14.  His chest x-ray shows cardiomegaly with mild to moderate diffuse pulmonary interstitial edema.  He was admitted to the medicine service and placed on intravenous furosemide.  He is currently resting comfortably in bed and only complains of abdominal fullness at this time.  Inpatient Medications    . amiodarone  200 mg Oral Daily  . aspirin EC  81 mg Oral Daily  . atorvastatin  80 mg Oral Daily  . [START ON 08/04/2018] calcitRIOL  0.25 mcg Oral Q M,W,F  . carvedilol  25 mg Oral BID WC  . cholecalciferol  1,000 Units Oral Daily  . febuxostat  80 mg Oral Daily  . furosemide  80 mg Intravenous Q12H  . heparin  5,000 Units Subcutaneous Q8H  . hydrALAZINE  25 mg Oral BID  . isosorbide mononitrate  60 mg Oral Daily  . mometasone-formoterol  2 puff Inhalation BID  . potassium chloride SA  20 mEq Oral BID  . ranolazine  500 mg Oral BID    Family History    Family History  Problem Relation Age of Onset  . Hypertension Mother        deceased  . Diabetes Mother        deceased  . Heart disease Mother        deceased  . Heart attack Father        at age 55, deceased   He indicated that his mother is deceased. He indicated that his father is deceased. He indicated that his maternal grandmother is deceased. He indicated that his maternal grandfather is deceased. He indicated that his paternal grandmother is deceased. He indicated that his paternal grandfather is deceased.   Social History    Social History   Socioeconomic History  . Marital status: Single    Spouse name: Not on file  . Number of children: 6  . Years of education: Not on file  . Highest education level: Not on file  Occupational History    . Occupation: Unemployed  Social Needs  . Financial resource strain: Not on file  . Food insecurity:    Worry: Not on file    Inability: Not on file  . Transportation needs:    Medical: Not on file    Non-medical: Not on file  Tobacco Use  . Smoking status: Former Smoker    Packs/day: 1.00    Years: 10.00    Pack years: 10.00    Types: Cigarettes    Last attempt to quit: 11/16/2006    Years since quitting: 11.7  . Smokeless tobacco: Never Used  Substance and Sexual Activity  . Alcohol use: No    Alcohol/week: 0.0 standard drinks  .  Drug use: Yes    Comment: Previous marijuana use  . Sexual activity: Not on file  Lifestyle  . Physical activity:    Days per week: Not on file    Minutes per session: Not on file  . Stress: Not on file  Relationships  . Social connections:    Talks on phone: Not on file    Gets together: Not on file    Attends religious service: Not on file    Active member of club or organization: Not on file    Attends meetings of clubs or organizations: Not on file    Relationship status: Not on file  . Intimate partner violence:    Fear of current or ex partner: Not on file    Emotionally abused: Not on file    Physically abused: Not on file    Forced sexual activity: Not on file  Other Topics Concern  . Not on file  Social History Narrative   The patient lives in [place] with his sister. He is legally separated and has 6 healthy children who do not live with him. He is not employed and is trying to get disability.     Review of Systems    General:  No chills, fever, night sweats or weight changes.  Cardiovascular:  +++ chest pain - worse with coughing, +++ dyspnea on exertion, +++ abd/lower ext edema, +++ orthopnea, no palpitations, +++ paroxysmal nocturnal dyspnea. Dermatological: No rash, lesions/masses Respiratory: +++ cough, +++ dyspnea Urologic: No hematuria, dysuria Abdominal:   +++ inc abd girth. No nausea, vomiting, diarrhea, bright red  blood per rectum, melena, or hematemesis Neurologic:  No visual changes, wkns, changes in mental status. All other systems reviewed and are otherwise negative except as noted above.  Physical Exam    Blood pressure (!) 143/111, pulse (!) 55, temperature 97.8 F (36.6 C), temperature source Oral, resp. rate 19, SpO2 100 %.  General: Pleasant, NAD Psych: Normal affect. Neuro: Alert and oriented X 3. Moves all extremities spontaneously. HEENT: Normal  Neck: Supple without bruits.  JVD to jaw. Lungs:  Resp regular and unlabored, bibasilar crackles. Heart: RRR no s3, + s4, or murmurs. Abdomen: semi-firm, protuberant, nontender, BS + x 4.  Extremities: No clubbing, cyanosis or trace bilat pretibial edema. DP/PT/Radials 2+ and equal bilaterally.  Labs    Troponin [facility] of Care Test) Recent Labs    08/03/18 0643  TROPIPOC 0.26*   Recent Labs    08/03/18 0834  TROPONINI 0.14*   Lab Results  Component Value Date   WBC 6.8 08/03/2018   HGB 10.7 (L) 08/03/2018   HCT 36.2 (L) 08/03/2018   MCV 92.8 08/03/2018   PLT 204 08/03/2018    Recent Labs  Lab 08/03/18 0312  NA 138  K 3.8  CL 99  CO2 27  BUN 53*  CREATININE 3.80*  CALCIUM 8.3*  GLUCOSE 164*     Lab Results  Component Value Date   INR 1.50 08/03/2018   INR 1.58 (H) 05/10/2013   INR 1.18 03/06/2012    Radiology Studies    Dg Chest 2 View  Result Date: 08/03/2018 CLINICAL DATA:  Initial evaluation for acute chest pain. EXAM: CHEST - 2 VIEW COMPARISON:  Prior radiograph from 01/31/2018 FINDINGS: Left-sided pacemaker/AICD in place. Moderate to severe cardiomegaly, unchanged. Mediastinal silhouette normal. Lungs hypoinflated. Diffuse pulmonary vascular congestion with interstitial prominence, compatible with pulmonary interstitial edema. No appreciable pleural effusion. No consolidative airspace disease. No pneumothorax. No acute osseous abnormality. IMPRESSION:  Cardiomegaly with mild to moderate diffuse pulmonary  interstitial edema. Electronically Signed   By: Rise Mu M.D.   On: 08/03/2018 04:36    ECG & Cardiac Imaging    Sinus brady, 54, IVCD, inf, antlat infarcts. No acute changes - personally reviewed.  Assessment & Plan    1.  Acute on chronic systolic CHF/Mixed ICM/NICM:  Pt w/ long h/o cardiomyopathy with most recent EF of 15% in April.  He reports compliance with medications but does not weigh himself at home.  Over the past 3 days, he has been having a progressive cough, dyspnea, orthopnea, and increasing abdominal girth.  He has also had pleuritic chest pain.  He awoke this morning with worsening dyspnea and chest pain, prompting call to EMS.  In the ED, his creatinine was slightly elevated above baseline at 3.8.  BNP was elevated at 3686.  Point-of-care troponins were 0.21  0.26, with lab base troponin returning at 0.14.  INR 1.5.  His chest x-ray shows cardiomegaly with mild to moderate diffuse pulmonary interstitial edema.  He has evidence of volume overload on exam with crackles, JVD, increased abdominal girth, and pretibial edema.  Agree with intravenous Lasix and in the setting of stage IV chronic kidney disease, nephrology consultation.  Continue beta-blocker and afterload reduction with nitrate/hydralazine.  If he does not respond to intravenous Lasix, we would have a low threshold to contact our heart failure service as he would likely require a right heart catheterization and potentially inotropic therapy.  He is not a candidate for ACE inhibitor/ARB/Entresto/Spironolactone/digoxin in the setting of renal failure.  2.  Chest pain/coronary artery disease/elevated troponin: Patient has been having pleuritic chest pain in the setting of cough over the past few days.  This worsened overnight.  He is currently chest pain-free.  His troponin is mildly elevated at 0.14.  This is likely secondary to demand ischemia in the setting of worsening heart failure.  His ECG is nonacute.  Continue  to trend troponin.  He is not a candidate for diagnostic catheterization in the setting of acute on chronic renal failure.  Continue aspirin, statin, beta-blocker, nitrate, and Ranexa therapy.  If troponins continue to rise, would plan to anticoagulate.  3.  Essential hypertension: Blood pressure currently elevated.  We will titrate his hydralazine to 3 times daily.  Follow with diuresis.  Continue beta-blocker and nitrate.  4.  Hyperlipidemia: Continue statin therapy.  He needs follow-up lipids.  Last LDL was in 2011.  5.  Stage IV chronic kidney disease: Followed by nephrology in [place].  In the setting of heart failure and probable cardiorenal syndrome, recommend nephrology consultation.  6.  Normocytic anemia: Stable.  Signed, Nicolasa Ducking, NP 08/03/2018, 11:48 AM  For questions or updates, please contact   Please consult [email or web address] for contact info under Cardiology/STEMI.   Attending note:  Patient seen and examined.  I reviewed his records and discussed the case with Mr. Brion Aliment NP.  Mr. Mccanless presents with recent worsening shortness of breath, orthopnea and increasing abdominal girth as well as leg swelling.  He denies chest pain but has had some intermittent coughing.  Denies any recent change in his medications, fluid or sodium intake.  He has not been weighing regularly.  On examination he is in no distress.  Heart rate is in the 50s to 60s in sinus rhythm by telemetry which I personally reviewed.  Systolic blood pressure 130s to 140s over diastolics in the 90-100 range.  He has elevated JVP, decreased  breath sounds at the bases.  Cardiac exam reveals RRR with indistinct PMI.  Abdomen protuberant, mild lower leg edema.  Lab work shows potassium 3.8, BUN 53, creatinine 3.8, BNP 3606, troponin I levels 0.21, 0.26, and 0.14. Hemoglobin is 10.1.  Chest x-ray shows cardiomegaly with interstitial edema.  Acute on chronic combined heart failure, LVEF was approximately 15%  back in April at which time he also had severe RV dysfunction and severe pulmonary hypertension with PASP 76 mmHg.  He has evidence of fluid overload on examination. Complicating the picture is CKD stage IV, creatinine 3.8.  Currently placed on Lasix at 80 mg IV twice daily and continuing on Coreg, hydralazine, and Imdur.  INR is 1.5, would check LFTs as he may have passive congestion and potentially low output picture.  Not a good candidate for PICC line for co-oxmetry in light of renal dysfunction.  He has diuresed about 1000 cc so far, would follow urine output and renal function closely.  Follow-up echocardiogram should also be obtained.  Would have a low threshold to involve the Heart Failure team in his management.  Right heart catheterization would be useful to further guide treatment options.  Jonelle Sidle, M.D., F.A.C.C.

## 2018-08-03 NOTE — Progress Notes (Signed)
Pt states chest pain is now 5/10  Vital signs documented  Per general admission orders- ordered maalox  Pt states pain came suddenly after eating  Pt sitting at edge of bed now  Paged NP cardiology to inform  RN at bedside

## 2018-08-03 NOTE — ED Triage Notes (Signed)
Patient arrived with EMS from home reports mid chest pain with SOB onset this morning , he received ASA 324 mg and 5 NTG sl prior to arrival , history of heart disease , his cardiologist is Dr. Antoine Poche .

## 2018-08-03 NOTE — Progress Notes (Signed)
Pt vital signs documented  Pt reports 9/10 chest pain, mid with sudden onset  Pt educated on nitro SL  1 nitro given at 1415 Primary RN aware

## 2018-08-03 NOTE — H&P (Addendum)
History and Physical    [name] XYB:[id number] DOB: [birth year] DOA: 08/03/2018  Referring MD/NP/PA: EDP PCP:  Patient coming from: Home  Chief Complaint: chest pain and dyspnea  HPI: [name] is a 52 y.o. 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 -Patient reports compliance with medications and low-salt diet. -He describes his chest pain as sharp at times and pressure-like on other occasions, midsternal, nonradiating, partly relieved by nitroglycerin given in the emergency room.  ED Course: The emergency room he was found to have worsening kidney disease with creatinine of 3.8, troponin only mildly elevated at 0.2, elevated BNP and chest x-ray concerning for fluid overload -EKG with Q waves in inferior and anterior leads, prolonged QTC  Review of Systems: As per HPI otherwise 14 point review of systems negative.   Past Medical History:  Diagnosis Date  . Anemia    a. 2008 in setting of profound epistaxis.  Marland Kitchen CAD (coronary artery disease)    a. NSTEMI 2008: in setting of profound epistaxis/anemia, cath showing severe diffuse RCA dz with L-R collaterals, nonobst dz in left system, managed medically. b. Type II NSTEMI 10/2008. c. Last cath 2012 - for med rx. d. 05/2013: elevated troponin ? due to ARF.  e. Ranexa added 03/2012.  . Cardiomyopathy (HCC)    a. Mixed ICM/NICM - Prior EF 25% back to 2008. b. Improved to 55% in 2012. c. Subsequent echoes: 45% in 11/2010, 15% by echo 05/2013.and  2016  . CHF (congestive heart failure) (HCC)   . Chronic kidney disease    a. Average creatine of about 2.3  . Epistaxis    a. 2008, 2009 a/w significant anemia.  Marland Kitchen History of pneumonia 08/2012, 05/2013  . Hyperlipidemia   . Hypertension    a. Difficult to control. b. Renal duplex neg for RAS 11/2012.  Marland Kitchen SVT (supraventricular tachycardia) (HCC)    a. 05/2013 tx with 6mg  adenosine in ER.    Past Surgical History:  Procedure Laterality Date  . CARDIAC CATHETERIZATION  2012   total RCA occlusion with left-to-right collaterals supplying the distal RCA branches, mild diffuse nonobstructive disease of LAD and LCx. Medically managed  . ICD IMPLANT N/A 01/30/2018   Procedure: ICD IMPLANT;  Surgeon: Marinus Maw, MD;  Location: [facility] INVASIVE CV LAB;  Service: Cardiovascular;  Laterality: N/A;     reports that he quit smoking about 11 years ago. His smoking use included cigarettes. He has a 10.00 pack-year smoking history. He has never used smokeless tobacco. He reports that he has current or past drug history. He reports that he does not drink alcohol.  No Known Allergies  Family History  Problem Relation Age of Onset  . Hypertension Mother        deceased  . Diabetes Mother        deceased  . Heart disease Mother        deceased  . Heart attack Father        at age 51, deceased     Prior to Admission medications  Medication Sig Start Date End Date Taking? Authorizing Provider  amiodarone (PACERONE) 200 MG tablet Take 200 mg by mouth daily.   Yes [provider]  aspirin 81 MG tablet Take 1 tablet (81 mg total) by mouth daily. 11/21/16  Yes Rollene Rotunda, MD  atorvastatin (LIPITOR) 80 MG tablet Take 80 mg by mouth daily. 12/26/16 08/03/26 Yes [provider]  budesonide-formoterol (SYMBICORT) 160-4.5 MCG/ACT inhaler Inhale 2 puffs into the lungs 2 (two) times daily. 07/04/17  Yes [provider]  calcitRIOL (ROCALTROL) 0.25 MCG capsule Take 0.25 mcg by mouth  every Monday, Wednesday, and Friday.  11/24/13  Yes [provider]  carvedilol (COREG) 25 MG tablet Take 1 tablet (25 mg total) by mouth 2 (two) times daily with a meal. 05/15/18  Yes Rollene Rotunda, MD  cholecalciferol (VITAMIN D) 1000 UNITS tablet Take 1,000 Units by mouth daily.   Yes [provider]  Febuxostat (ULORIC) 80 MG TABS Take 80 mg by mouth daily.  12/17/16  Yes [provider]  hydrALAZINE (APRESOLINE) 25 MG tablet Take 1 tablet (25 mg total) by mouth 2 (two) times daily. 03/19/18  Yes Rollene Rotunda, MD  isosorbide mononitrate (IMDUR) 60 MG 24 hr tablet Take 1 tablet (60 mg total) by mouth daily. 05/07/18  Yes Rollene Rotunda, MD  metolazone (ZAROXOLYN) 5 MG tablet Take 5 mg by mouth daily.  01/09/18  Yes [provider]  nitroGLYCERIN (NITROSTAT) 0.4 MG SL tablet Place 1 tablet (0.4 mg total) under the tongue every 5 (five) minutes as needed for chest pain. KEEP OV. 07/11/16  Yes Hochrein, Fayrene Fearing, MD  potassium chloride SA (K-DUR,KLOR-CON) 20 MEQ tablet TAKE ONE TABLET BY MOUTH 2 TIMES A DAY Patient taking differently: Take 20 mEq by mouth 2 (two) times daily.  04/10/18  Yes Rollene Rotunda, MD  ranolazine (RANEXA) 500 MG 12 hr tablet TAKE ONE TABLET BY MOUTH 2 TIMES A DAY **CALL OFFICE TO MAKE APPOINTMENT** Patient taking differently: Take 500 mg by mouth 2 (two) times daily.  11/01/17  Yes Rollene Rotunda, MD  torsemide (DEMADEX) 20 MG tablet Take 20 mg by mouth daily.   Yes [provider]    Physical Exam: Vitals:   08/03/18 0715 08/03/18 0730 08/03/18 0828 08/03/18 0841  BP: (!) 140/105 (!) 137/100 (!) 148/112 (!) 143/111  Pulse: (!) 48 (!) 50 (!) 54 (!) 55  Resp: 17 17 19    Temp:   97.8 F (36.6 C)   TempSrc:   Oral   SpO2: 99% 99% 100%       Constitutional: NAD, calm, comfortable, and up in bed, no distress Vitals:   08/03/18 0715 08/03/18 0730 08/03/18 0828 08/03/18 0841  BP: (!) 140/105 (!) 137/100 (!) 148/112 (!) 143/111    Pulse: (!) 48 (!) 50 (!) 54 (!) 55  Resp: 17 17 19    Temp:   97.8 F (36.6 C)   TempSrc:   Oral   SpO2: 99% 99% 100%    Eyes: PERRL, lids and conjunctivae normal ENMT: Mucous membranes are moist.  Neck: normal, supple Respiratory: Fine basilar crackles noted Cardiovascular: 1 S2/regular rate rhythm Abdomen: soft, non tender, Bowel sounds positive.  Musculoskeletal: No joint deformity upper and lower extremities. Ext: Trace edema Skin: no rashes, lesions, ulcers.  Neurologic: CN 2-12 grossly intact. Sensation intact, DTR normal. Strength 5/5 in all 4.  Psychiatric: Normal judgment and insight. Alert and oriented x 3. Normal mood.   Labs on Admission: I have personally reviewed following labs and  imaging studies  CBC: Recent Labs  Lab 08/03/18 0312  WBC 6.8  NEUTROABS 4.8  HGB 10.7*  HCT 36.2*  MCV 92.8  PLT 204   Basic Metabolic Panel: Recent Labs  Lab 08/03/18 0312  NA 138  K 3.8  CL 99  CO2 27  GLUCOSE 164*  BUN 53*  CREATININE 3.80*  CALCIUM 8.3*   GFR: CrCl cannot be calculated (Unknown ideal weight.). Liver Function Tests: No results for input(s): AST, ALT, ALKPHOS, BILITOT, PROT, ALBUMIN in the last 168 hours. No results for input(s): LIPASE, AMYLASE in the last 168 hours. No results for input(s): AMMONIA in the last 168 hours. Coagulation Profile: Recent Labs  Lab 08/03/18 0312  INR 1.50   Cardiac Enzymes: Recent Labs  Lab 08/03/18 0834  TROPONINI 0.14*   BNP (last 3 results) No results for input(s): PROBNP in the last 8760 hours. HbA1C: No results for input(s): HGBA1C in the last 72 hours. CBG: No results for input(s): GLUCAP in the last 168 hours. Lipid Profile: No results for input(s): CHOL, HDL, LDLCALC, TRIG, CHOLHDL, LDLDIRECT in the last 72 hours. Thyroid Function Tests: No results for input(s): TSH, T4TOTAL, FREET4, T3FREE, THYROIDAB in the last 72 hours. Anemia Panel: No results for input(s): VITAMINB12, FOLATE, FERRITIN,  TIBC, IRON, RETICCTPCT in the last 72 hours. Urine analysis:    Component Value Date/Time   COLORURINE AMBER (A) 05/10/2013 0404   APPEARANCEUR CLOUDY (A) 05/10/2013 0404   LABSPEC 1.012 05/10/2013 0404   PHURINE 5.5 05/10/2013 0404   GLUCOSEU NEGATIVE 05/10/2013 0404   HGBUR TRACE (A) 05/10/2013 0404   BILIRUBINUR NEGATIVE 05/10/2013 0404   KETONESUR NEGATIVE 05/10/2013 0404   PROTEINUR 100 (A) 05/10/2013 0404   UROBILINOGEN 1.0 05/10/2013 0404   NITRITE NEGATIVE 05/10/2013 0404   LEUKOCYTESUR TRACE (A) 05/10/2013 0404   Sepsis Labs: @LABRCNTIP (procalcitonin:4,lacticidven:4) )No results found for this or any previous visit (from the past 240 hour(s)).   Radiological Exams on Admission: Dg Chest 2 View  Result Date: 08/03/2018 CLINICAL DATA:  Initial evaluation for acute chest pain. EXAM: CHEST - 2 VIEW COMPARISON:  Prior radiograph from 01/31/2018 FINDINGS: Left-sided pacemaker/AICD in place. Moderate to severe cardiomegaly, unchanged. Mediastinal silhouette normal. Lungs hypoinflated. Diffuse pulmonary vascular congestion with interstitial prominence, compatible with pulmonary interstitial edema. No appreciable pleural effusion. No consolidative airspace disease. No pneumothorax. No acute osseous abnormality. IMPRESSION: Cardiomegaly with mild to moderate diffuse pulmonary interstitial edema. Electronically Signed   By: Rise Mu M.D.   On: 08/03/2018 04:36    EKG: Independently reviewed.  Sinus rhythm, prolonged QTC, Q waves in inferior and anterior leads  Assessment/Plan Active Problems:  Acute on chronic systolic CHF -Mixed cardiomyopathy ICM and NICM, EF 15% -Also has advanced chronic kidney disease stage IV -Diuresis with IV Lasix 80 mg every 12, may need higher doses -We will consult cardiology  Chest pain/elevated troponin -Likely demand ischemia due to CHF -Last cath 2012 with occluded RCA with collaterals and nonobstructive disease in LAD and  circumflex -Continue aspirin, Coreg, Lipitor, Imdur, hydralazine -Not a candidate for left heart catheterization given advanced kidney disease -Continue medical management -Repeat echocardiogram  Chronic kidney disease stage IV -baseline creatinine around 3 -Creatinine higher at 3.8 but no significant difference in GFR -Worsening could be cardiorenal, followed by Dr. Kristian Covey in [place] -Does not have a dialysis access yet, urine output is relatively fair so far -will consult nephrology if kidney function continues to deteriorate  Chronic hypertension -Stable, continue Coreg Imdur hydralazine -Monitor with diuresis  Anemia of chronic disease -stable, monitor  DVT prophylaxis: Hep SQ Code Status: Full Code  Family Communication: no family at bedside Disposition Plan: Home pending work-up and management  consults called: Cardiology Admission status: Inpatient  Zannie Cove MD [facility] Pager [phone]  If 7PM-7AM, please contact night-coverage [email or web address] Password [facility]  08/03/2018, 10:44 AM

## 2018-08-03 NOTE — Progress Notes (Signed)
NP cardiology aware of pt status, stated to complete second EKG  Educated pt on new medication, maalox  EKG completed and placed in chart  Primary RN aware

## 2018-08-04 ENCOUNTER — Encounter (HOSPITAL_COMMUNITY): Payer: Self-pay

## 2018-08-04 ENCOUNTER — Inpatient Hospital Stay (HOSPITAL_COMMUNITY): Payer: Medicare Other

## 2018-08-04 ENCOUNTER — Other Ambulatory Visit: Payer: Self-pay

## 2018-08-04 DIAGNOSIS — I5042 Chronic combined systolic (congestive) and diastolic (congestive) heart failure: Secondary | ICD-10-CM

## 2018-08-04 DIAGNOSIS — R079 Chest pain, unspecified: Secondary | ICD-10-CM

## 2018-08-04 DIAGNOSIS — I25119 Atherosclerotic heart disease of native coronary artery with unspecified angina pectoris: Secondary | ICD-10-CM

## 2018-08-04 DIAGNOSIS — I340 Nonrheumatic mitral (valve) insufficiency: Secondary | ICD-10-CM

## 2018-08-04 DIAGNOSIS — N183 Chronic kidney disease, stage 3 (moderate): Secondary | ICD-10-CM

## 2018-08-04 LAB — BASIC METABOLIC PANEL
ANION GAP: 10 (ref 5–15)
BUN: 49 mg/dL — ABNORMAL HIGH (ref 6–20)
CO2: 31 mmol/L (ref 22–32)
Calcium: 8.2 mg/dL — ABNORMAL LOW (ref 8.9–10.3)
Chloride: 99 mmol/L (ref 98–111)
Creatinine, Ser: 3.54 mg/dL — ABNORMAL HIGH (ref 0.61–1.24)
GFR calc Af Amer: 22 mL/min — ABNORMAL LOW (ref >60)
GFR calc non Af Amer: 19 mL/min — ABNORMAL LOW (ref >60)
Glucose, Bld: 111 mg/dL — ABNORMAL HIGH (ref 70–99)
Potassium: 4 mmol/L (ref 3.5–5.1)
Sodium: 140 mmol/L (ref 135–145)

## 2018-08-04 LAB — RESPIRATORY PANEL BY PCR
Adenovirus: NOT DETECTED
Bordetella pertussis: NOT DETECTED
CHLAMYDOPHILA PNEUMONIAE-RVPPCR: NOT DETECTED
Coronavirus 229E: NOT DETECTED
Coronavirus HKU1: NOT DETECTED
Coronavirus NL63: NOT DETECTED
Coronavirus OC43: NOT DETECTED
Influenza A: NOT DETECTED
Influenza B: NOT DETECTED
Metapneumovirus: NOT DETECTED
Mycoplasma pneumoniae: NOT DETECTED
PARAINFLUENZA VIRUS 3-RVPPCR: NOT DETECTED
Parainfluenza Virus 1: NOT DETECTED
Parainfluenza Virus 2: NOT DETECTED
Parainfluenza Virus 4: NOT DETECTED
Respiratory Syncytial Virus: NOT DETECTED
Rhinovirus / Enterovirus: DETECTED — AB

## 2018-08-04 LAB — CBC WITH DIFFERENTIAL/PLATELET
Abs Immature Granulocytes: 0.02 10*3/uL (ref 0.00–0.07)
Basophils Absolute: 0.1 10*3/uL (ref 0.0–0.1)
Basophils Relative: 1 %
Eosinophils Absolute: 0.2 10*3/uL (ref 0.0–0.5)
Eosinophils Relative: 3 %
HEMATOCRIT: 34.2 % — AB (ref 39.0–52.0)
Hemoglobin: 10.7 g/dL — ABNORMAL LOW (ref 13.0–17.0)
Immature Granulocytes: 0 %
Lymphocytes Relative: 19 %
Lymphs Abs: 1.2 10*3/uL (ref 0.7–4.0)
MCH: 28.5 pg (ref 26.0–34.0)
MCHC: 31.3 g/dL (ref 30.0–36.0)
MCV: 91 fL (ref 80.0–100.0)
Monocytes Absolute: 0.4 10*3/uL (ref 0.1–1.0)
Monocytes Relative: 6 %
NEUTROS PCT: 71 %
Neutro Abs: 4.6 10*3/uL (ref 1.7–7.7)
Platelets: 220 10*3/uL (ref 150–400)
RBC: 3.76 MIL/uL — ABNORMAL LOW (ref 4.22–5.81)
RDW: 14.5 % (ref 11.5–15.5)
WBC: 6.5 10*3/uL (ref 4.0–10.5)
nRBC: 0 % (ref 0.0–0.2)

## 2018-08-04 LAB — ECHOCARDIOGRAM COMPLETE
Height: 70 in
Weight: 3155.2 oz

## 2018-08-04 LAB — HIV ANTIBODY (ROUTINE TESTING W REFLEX): HIV Screen 4th Generation wRfx: NONREACTIVE

## 2018-08-04 MED ORDER — ASPIRIN 81 MG PO CHEW: 81.0000 mg | CHEWABLE_TABLET | ORAL | Status: DC

## 2018-08-04 MED ORDER — FUROSEMIDE 10 MG/ML IJ SOLN
80.0000 mg | Freq: Two times a day (BID) | INTRAMUSCULAR | Status: AC
  Administered 2018-08-04: 80 mg via INTRAVENOUS
  Filled 2018-08-04: qty 8

## 2018-08-04 MED ORDER — SODIUM CHLORIDE 0.9 % IV SOLN: 250.0000 mL | INTRAVENOUS | Status: DC | PRN

## 2018-08-04 MED ORDER — SALINE SPRAY 0.65 % NA SOLN
1.0000 | NASAL | Status: DC | PRN
  Administered 2018-08-04 – 2018-08-09 (×4): 1 via NASAL
  Filled 2018-08-04: qty 44

## 2018-08-04 MED ORDER — SODIUM CHLORIDE 0.9% FLUSH: 3.0000 mL | INTRAVENOUS | Status: DC | PRN

## 2018-08-04 MED ORDER — SODIUM CHLORIDE 0.9% FLUSH
3.0000 mL | Freq: Two times a day (BID) | INTRAVENOUS | Status: DC
  Administered 2018-08-04: 3 mL via INTRAVENOUS

## 2018-08-04 MED ORDER — PERFLUTREN LIPID MICROSPHERE
1.0000 mL | INTRAVENOUS | Status: AC | PRN
  Administered 2018-08-04: 2 mL via INTRAVENOUS
  Filled 2018-08-04: qty 10

## 2018-08-04 MED ORDER — FLUTICASONE PROPIONATE 50 MCG/ACT NA SUSP
1.0000 | Freq: Every day | NASAL | Status: DC
  Administered 2018-08-04 – 2018-08-09 (×6): 1 via NASAL
  Filled 2018-08-04: qty 16

## 2018-08-04 MED ORDER — SODIUM CHLORIDE 0.9 % IV SOLN
INTRAVENOUS | Status: DC
  Administered 2018-08-05: 07:00:00 via INTRAVENOUS

## 2018-08-04 NOTE — Progress Notes (Signed)
  Echocardiogram 2D Echocardiogram has been performed.  [name] 08/04/2018, 11:40 AM

## 2018-08-04 NOTE — Plan of Care (Signed)
  Problem: Education: Goal: Knowledge of General Education information will improve Description Including pain rating scale, medication(s)/side effects and non-pharmacologic comfort measures Outcome: Progressing   Problem: Health Behavior/Discharge Planning: Goal: Ability to manage health-related needs will improve Outcome: Progressing   Problem: Coping: Goal: Level of anxiety will decrease Outcome: Progressing   Problem: Coping: Goal: Level of anxiety will decrease Outcome: Progressing   Problem: Pain Managment: Goal: General experience of comfort will improve Outcome: Progressing   Problem: Safety: Goal: Ability to remain free from injury will improve Outcome: Progressing   Problem: Activity: Goal: Capacity to carry out activities will improve Outcome: Progressing

## 2018-08-04 NOTE — H&P (View-Only) (Signed)
Progress Note  Patient Name: [name] Date of Encounter: 08/04/2018  Primary Cardiologist: Rollene Rotunda, MD   Subjective   Breathing is better but not at baseline   Has some CP with coughing   Feels like he could cough something up    Inpatient Medications    Scheduled Meds: . amiodarone  200 mg Oral Daily  . aspirin EC  81 mg Oral Daily  . atorvastatin  80 mg Oral Daily  . calcitRIOL  0.25 mcg Oral Q M,W,F  . carvedilol  25 mg Oral BID WC  . cholecalciferol  1,000 Units Oral Daily  . febuxostat  80 mg Oral Daily  . furosemide  80 mg Intravenous Q12H  . heparin  5,000 Units Subcutaneous Q8H  . hydrALAZINE  25 mg Oral TID  . isosorbide mononitrate  60 mg Oral Daily  . mometasone-formoterol  2 puff Inhalation BID  . potassium chloride SA  20 mEq Oral BID  . ranolazine  500 mg Oral BID   Continuous Infusions:  PRN Meds: acetaminophen **OR** acetaminophen, alum & mag hydroxide-simeth, morphine injection, nitroGLYCERIN, ondansetron **OR** ondansetron (ZOFRAN) IV   Vital Signs    Vitals:   08/03/18 2122 08/03/18 2135 08/04/18 0015 08/04/18 0438  BP:  115/83 128/89 (!) 130/93  Pulse:  (!) 50 (!) 58 (!) 51  Resp:    20  Temp:   97.7 F (36.5 C) 98.1 F (36.7 C)  TempSrc:   Oral Oral  SpO2: 97%  100% 100%  Weight:    89.4 kg  Height:    5\' 10"  (1.778 m)    Intake/Output Summary (Last 24 hours) at 08/04/2018 0815 Last data filed at 08/04/2018 0800 Gross per 24 hour  Intake 1120 ml  Output 5400 ml  Net -4280 ml   Filed Weights   08/04/18 0438  Weight: 89.4 kg    Telemetry    SR - Personally Reviewed  ECG      Physical Exam   GEN: No acute distress.   Neck:  JVP is increased   Cardiac: RRR, no murmurs, rubs, or gallops.  Respiratory: Mild rhonchi   Minimal basilar rales   GI: Soft, nontender, non-distended  MS: No edema; No deformity. Neuro:  Nonfocal  Psych: Normal affect   Labs    Chemistry Recent Labs  Lab 08/03/18 0312  NA 138    K 3.8  CL 99  CO2 27  GLUCOSE 164*  BUN 53*  CREATININE 3.80*  CALCIUM 8.3*  GFRNONAA 17*  GFRAA 20*  ANIONGAP 12     Hematology Recent Labs  Lab 08/03/18 0312  WBC 6.8  RBC 3.90*  HGB 10.7*  HCT 36.2*  MCV 92.8  MCH 27.4  MCHC 29.6*  RDW 14.4  PLT 204    Cardiac Enzymes Recent Labs  Lab 08/03/18 0834 08/03/18 1359  TROPONINI 0.14* 0.15*    Recent Labs  Lab 08/03/18 0325 08/03/18 0643  TROPIPOC 0.21* 0.26*     BNP Recent Labs  Lab 08/03/18 0312  BNP 3,686.6*     DDimer No results for input(s): DDIMER in the last 168 hours.   Radiology    Dg Chest 2 View  Result Date: 08/03/2018 CLINICAL DATA:  Initial evaluation for acute chest pain. EXAM: CHEST - 2 VIEW COMPARISON:  Prior radiograph from 01/31/2018 FINDINGS: Left-sided pacemaker/AICD in place. Moderate to severe cardiomegaly, unchanged. Mediastinal silhouette normal. Lungs hypoinflated. Diffuse pulmonary vascular congestion with interstitial prominence, compatible with pulmonary interstitial edema. No appreciable  pleural effusion. No consolidative airspace disease. No pneumothorax. No acute osseous abnormality. IMPRESSION: Cardiomegaly with mild to moderate diffuse pulmonary interstitial edema. Electronically Signed   By: Rise Mu M.D.   On: 08/03/2018 04:36    Cardiac Studies     Patient Profile      52 y.o. male with a history of CAD, mixed ICM/NICM, HFrEF, NSVT s/p ICD, CKD IV, HTN, and HL, who is being seen today for the evaluation of chest pain and CHF at the request of Dr. Jomarie Longs.  Assessment & Plan    1   Acute on chronic systolic CHF.  Hx NICM   LVEF 15%  Diuresing but volume still appears to be up    Would recomm R Heart cath to make sure he is at near dry wt (given renal dysfunction)   Plan for tomorrow   Continue diuresis today    2  CP   / CAD  / elevated troponin  Trivial bump in troponin  I am not coninvinced of active angina.    3   HTN  BP improving with diuresis     4   HL  COntinue statin rx   5  CKD   Stage IV Follows with nephrology in [place]    For questions or updates, please contact [facility] Please consult [email or web address] for contact info under        Signed, Dietrich Pates, MD  08/04/2018, 8:15 AM

## 2018-08-04 NOTE — Progress Notes (Signed)
 Progress Note  Patient Name: [name] Date of Encounter: 08/04/2018  Primary Cardiologist: James Hochrein, MD   Subjective   Breathing is better but not at baseline   Has some CP with coughing   Feels like he could cough something up    Inpatient Medications    Scheduled Meds: . amiodarone  200 mg Oral Daily  . aspirin EC  81 mg Oral Daily  . atorvastatin  80 mg Oral Daily  . calcitRIOL  0.25 mcg Oral Q M,W,F  . carvedilol  25 mg Oral BID WC  . cholecalciferol  1,000 Units Oral Daily  . febuxostat  80 mg Oral Daily  . furosemide  80 mg Intravenous Q12H  . heparin  5,000 Units Subcutaneous Q8H  . hydrALAZINE  25 mg Oral TID  . isosorbide mononitrate  60 mg Oral Daily  . mometasone-formoterol  2 puff Inhalation BID  . potassium chloride SA  20 mEq Oral BID  . ranolazine  500 mg Oral BID   Continuous Infusions:  PRN Meds: acetaminophen **OR** acetaminophen, alum & mag hydroxide-simeth, morphine injection, nitroGLYCERIN, ondansetron **OR** ondansetron (ZOFRAN) IV   Vital Signs    Vitals:   08/03/18 2122 08/03/18 2135 08/04/18 0015 08/04/18 0438  BP:  115/83 128/89 (!) 130/93  Pulse:  (!) 50 (!) 58 (!) 51  Resp:    20  Temp:   97.7 F (36.5 C) 98.1 F (36.7 C)  TempSrc:   Oral Oral  SpO2: 97%  100% 100%  Weight:    89.4 kg  Height:    5' 10" (1.778 m)    Intake/Output Summary (Last 24 hours) at 08/04/2018 0815 Last data filed at 08/04/2018 0800 Gross per 24 hour  Intake 1120 ml  Output 5400 ml  Net -4280 ml   Filed Weights   08/04/18 0438  Weight: 89.4 kg    Telemetry    SR - Personally Reviewed  ECG      Physical Exam   GEN: No acute distress.   Neck:  JVP is increased   Cardiac: RRR, no murmurs, rubs, or gallops.  Respiratory: Mild rhonchi   Minimal basilar rales   GI: Soft, nontender, non-distended  MS: No edema; No deformity. Neuro:  Nonfocal  Psych: Normal affect   Labs    Chemistry Recent Labs  Lab 08/03/18 0312  NA 138    K 3.8  CL 99  CO2 27  GLUCOSE 164*  BUN 53*  CREATININE 3.80*  CALCIUM 8.3*  GFRNONAA 17*  GFRAA 20*  ANIONGAP 12     Hematology Recent Labs  Lab 08/03/18 0312  WBC 6.8  RBC 3.90*  HGB 10.7*  HCT 36.2*  MCV 92.8  MCH 27.4  MCHC 29.6*  RDW 14.4  PLT 204    Cardiac Enzymes Recent Labs  Lab 08/03/18 0834 08/03/18 1359  TROPONINI 0.14* 0.15*    Recent Labs  Lab 08/03/18 0325 08/03/18 0643  TROPIPOC 0.21* 0.26*     BNP Recent Labs  Lab 08/03/18 0312  BNP 3,686.6*     DDimer No results for input(s): DDIMER in the last 168 hours.   Radiology    Dg Chest 2 View  Result Date: 08/03/2018 CLINICAL DATA:  Initial evaluation for acute chest pain. EXAM: CHEST - 2 VIEW COMPARISON:  Prior radiograph from 01/31/2018 FINDINGS: Left-sided pacemaker/AICD in place. Moderate to severe cardiomegaly, unchanged. Mediastinal silhouette normal. Lungs hypoinflated. Diffuse pulmonary vascular congestion with interstitial prominence, compatible with pulmonary interstitial edema. No appreciable   pleural effusion. No consolidative airspace disease. No pneumothorax. No acute osseous abnormality. IMPRESSION: Cardiomegaly with mild to moderate diffuse pulmonary interstitial edema. Electronically Signed   By: Benjamin  McClintock M.D.   On: 08/03/2018 04:36    Cardiac Studies     Patient Profile      52 y.o. male with a history of CAD, mixed ICM/NICM, HFrEF, NSVT s/p ICD, CKD IV, HTN, and HL, who is being seen today for the evaluation of chest pain and CHF at the request of Dr. Joseph.  Assessment & Plan    1   Acute on chronic systolic CHF.  Hx NICM   LVEF 15%  Diuresing but volume still appears to be up    Would recomm R Heart cath to make sure he is at near dry wt (given renal dysfunction)   Plan for tomorrow   Continue diuresis today    2  CP   / CAD  / elevated troponin  Trivial bump in troponin  I am not coninvinced of active angina.    3   HTN  BP improving with diuresis     4   HL  COntinue statin rx   5  CKD   Stage IV Follows with nephrology in [place]    For questions or updates, please contact [facility] Please consult [email or web address] for contact info under        Signed, Laasya Peyton, MD  08/04/2018, 8:15 AM    

## 2018-08-04 NOTE — Progress Notes (Signed)
PROGRESS NOTE  [name] ZOX:[id number] DOB: [birth year] DOA: 08/03/2018 PCP: Joette Catching, MD  HPI/Recap of past 24 hours: HPI from Dr Lenon Oms is a 52 y.o. 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 Patient reports compliance with medications and low-salt diet. Pt describes his chest pain as sharp at times and pressure-like on other occasions, midsternal, nonradiating, partly relieved by nitroglycerin given in the emergency room. In the ED, pt was found to have worsening kidney disease with creatinine of 3.8, troponin only mildly elevated at 0.2, elevated BNP and chest x-ray concerning for fluid overload. EKG with Q waves in inferior and anterior leads, prolonged QTC. Cardiology consulted. Pt admitted for further management.   Today, patient reported feeling the same, still short of breath, congestion/cough associated with chest pain. Denies any abdominal pain, fever/chills.  Assessment/Plan: Active Problems:   Coronary atherosclerosis   Chest pain   CKD (chronic kidney disease), stage III (HCC)   Chronic combined systolic and diastolic CHF (congestive heart failure) (HCC)   Malignant hypertension   Coronary artery disease involving native coronary artery of native heart without angina pectoris   Dyspnea  Acute on chronic systolic CHF BNP 8119 Mixed cardiomyopathy ICM and NICM, EF 15% on 12/2017 Chest x-ray with cardiomegaly with mild to moderate diffuse pulmonary  interstitial edema Repeat echo pending Continue diuresis with IV Lasix Cardiology consulted: Recommending right heart cath, continue diuresis, kept n.p.o. overnight  Chest pain/elevated troponin Chest pain associated with coughing Likely demand ischemia due to CHF Chest x-ray as above Last cath 2012 with occluded RCA with collaterals and nonobstructive disease in LAD and circumflex Continue aspirin, Coreg, Lipitor, Imdur, hydralazine Not a candidate for left heart catheterization given advanced kidney disease Repeat echocardiogram pending  AKI on chronic kidney disease stage IV Baseline creatinine around 3 ?? cardiorenal, followed by Dr. Kristian Covey in [employer] -5.1 urine output since admission Does not have a dialysis access as of today We will consult nephrology if creatinine worsens  Anemia of chronic kidney disease Hemoglobin stable at baseline Daily CBC  Hypertension Continue Coreg, Imdur, hydralazine        Malnutrition Type:      Malnutrition Characteristics:      Nutrition Interventions:       Estimated body mass index is 28.3 kg/m as calculated from the following:   Height as of this encounter: 5\' 10"  (1.778 m).   Weight as of this encounter: 89.4 kg.     Code Status: Full  Family Communication: None at bedside  Disposition Plan: To be determined, still requires IV diuresis   Consultants:  Cardiology  Procedures:  None  Antimicrobials:  None  DVT prophylaxis: Heparin   Objective: Vitals:   08/04/18 0438 08/04/18 0820 08/04/18 0948 08/04/18 1244  BP: (!) 130/93  135/90 (!) 137/99  Pulse: (!) 51  (!) 53 (!) 54  Resp: 20   19  Temp: 98.1 F (36.7 C)   98 F (36.7 C)  TempSrc: Oral   Oral  SpO2: 100% 99% 97% 99%  Weight: 89.4 kg     Height: 5\' 10"  (1.778 m)       Intake/Output Summary (Last 24 hours) at 08/04/2018 1303 Last data filed at 08/04/2018 1246 Gross per 24 hour  Intake 1160 ml  Output 5225 ml  Net -4065 ml     Filed Weights   08/04/18 0438  Weight: 89.4 kg    Exam:   General: NAD  Cardiovascular: S1, S2 present  Respiratory: Bibasilar crackles noted  Abdomen: Soft, nontender, nondistended, bowel sounds present  Musculoskeletal: Trace bilateral pedal edema  Skin: Normal  Psychiatry: Normal mood   Data Reviewed: CBC: Recent Labs  Lab 08/03/18 0312 08/04/18 0818  WBC 6.8 6.5  NEUTROABS 4.8 4.6  HGB 10.7* 10.7*  HCT 36.2* 34.2*  MCV 92.8 91.0  PLT 204 220   Basic Metabolic Panel: Recent Labs  Lab 08/03/18 0312 08/04/18 0818  NA 138 140  K 3.8 4.0  CL 99 99  CO2 27 31  GLUCOSE 164* 111*  BUN 53* 49*  CREATININE 3.80* 3.54*  CALCIUM 8.3* 8.2*   GFR: Estimated Creatinine Clearance: 27.8 mL/min (A) (by C-G formula based on SCr of 3.54 mg/dL (H)). Liver Function Tests: No results for input(s): AST, ALT, ALKPHOS, BILITOT, PROT, ALBUMIN in the last 168 hours. No results for input(s): LIPASE, AMYLASE in the last 168 hours. No results for input(s): AMMONIA in the last 168 hours. Coagulation Profile: Recent Labs  Lab 08/03/18 0312  INR 1.50   Cardiac Enzymes: Recent Labs  Lab 08/03/18 0834 08/03/18 1359  TROPONINI 0.14* 0.15*   BNP (last 3 results) No results for input(s): PROBNP in the last 8760 hours. HbA1C: No results for input(s): HGBA1C in the last 72 hours. CBG: No results for input(s): GLUCAP in the last 168 hours. Lipid Profile: No results for input(s): CHOL, HDL, LDLCALC, TRIG, CHOLHDL, LDLDIRECT in the last 72 hours. Thyroid Function Tests: No results for input(s): TSH, T4TOTAL, FREET4, T3FREE, THYROIDAB in the last 72 hours. Anemia Panel: No results for input(s): VITAMINB12, FOLATE, FERRITIN, TIBC, IRON, RETICCTPCT in the last 72 hours. Urine analysis:    Component Value Date/Time   COLORURINE AMBER (A) 05/10/2013 0404   APPEARANCEUR CLOUDY (A) 05/10/2013 0404   LABSPEC 1.012 05/10/2013 0404   PHURINE 5.5 05/10/2013 0404   GLUCOSEU  NEGATIVE 05/10/2013 0404   HGBUR TRACE (A) 05/10/2013 0404   BILIRUBINUR NEGATIVE 05/10/2013 0404   KETONESUR NEGATIVE 05/10/2013 0404   PROTEINUR 100 (A) 05/10/2013 0404   UROBILINOGEN 1.0 05/10/2013 0404   NITRITE NEGATIVE 05/10/2013 0404   LEUKOCYTESUR TRACE (A) 05/10/2013 0404   Sepsis Labs: @LABRCNTIP (procalcitonin:4,lacticidven:4)  )No results found for this or any previous visit (from the past 240 hour(s)).    Studies: No results found.  Scheduled Meds: . amiodarone  200 mg Oral Daily  . aspirin EC  81 mg Oral Daily  . atorvastatin  80 mg Oral Daily  . calcitRIOL  0.25 mcg Oral Q M,W,F  . carvedilol  25 mg Oral BID WC  . cholecalciferol  1,000 Units Oral Daily  . febuxostat  80 mg Oral Daily  . fluticasone  1 spray Each Nare Daily  . furosemide  80 mg Intravenous Q12H  . heparin  5,000 Units Subcutaneous Q8H  . hydrALAZINE  25 mg Oral TID  . isosorbide mononitrate  60 mg Oral Daily  . mometasone-formoterol  2 puff Inhalation BID  . potassium chloride SA  20 mEq Oral BID  . ranolazine  500 mg Oral BID  Continuous Infusions:   LOS: 1 day     Briant Cedar, MD [facility]  If 7PM-7AM, please contact night-coverage [email or web address] 08/04/2018, 1:03 PM

## 2018-08-05 ENCOUNTER — Encounter (HOSPITAL_COMMUNITY)
Admission: EM | Disposition: A | Discharge status: Home / Self Care | Payer: Self-pay | Source: Home / Self Care | Attending: Internal Medicine

## 2018-08-05 ENCOUNTER — Encounter (HOSPITAL_COMMUNITY): Payer: Self-pay | Admitting: Cardiovascular Disease

## 2018-08-05 DIAGNOSIS — I10 Essential (primary) hypertension: Secondary | ICD-10-CM

## 2018-08-05 DIAGNOSIS — I509 Heart failure, unspecified: Secondary | ICD-10-CM

## 2018-08-05 DIAGNOSIS — I5023 Acute on chronic systolic (congestive) heart failure: Secondary | ICD-10-CM

## 2018-08-05 DIAGNOSIS — N189 Chronic kidney disease, unspecified: Secondary | ICD-10-CM

## 2018-08-05 DIAGNOSIS — I4892 Unspecified atrial flutter: Secondary | ICD-10-CM

## 2018-08-05 DIAGNOSIS — D631 Anemia in chronic kidney disease: Secondary | ICD-10-CM

## 2018-08-05 HISTORY — PX: RIGHT HEART CATH: CATH118263

## 2018-08-05 LAB — BASIC METABOLIC PANEL
Anion gap: 10 (ref 5–15)
BUN: 47 mg/dL — AB (ref 6–20)
CO2: 32 mmol/L (ref 22–32)
Calcium: 8.5 mg/dL — ABNORMAL LOW (ref 8.9–10.3)
Chloride: 96 mmol/L — ABNORMAL LOW (ref 98–111)
Creatinine, Ser: 3.68 mg/dL — ABNORMAL HIGH (ref 0.61–1.24)
GFR calc Af Amer: 21 mL/min — ABNORMAL LOW (ref >60)
GFR calc non Af Amer: 18 mL/min — ABNORMAL LOW (ref >60)
Glucose, Bld: 100 mg/dL — ABNORMAL HIGH (ref 70–99)
Potassium: 3.6 mmol/L (ref 3.5–5.1)
Sodium: 138 mmol/L (ref 135–145)

## 2018-08-05 LAB — POCT I-STAT 3, VENOUS BLOOD GAS (G3P V)
Acid-Base Excess: 7 mmol/L — ABNORMAL HIGH (ref 0.0–2.0)
Bicarbonate: 33.5 mmol/L — ABNORMAL HIGH (ref 20.0–28.0)
O2 Saturation: 52 %
TCO2: 35 mmol/L — ABNORMAL HIGH (ref 22–32)
pCO2, Ven: 53.8 mmHg (ref 44.0–60.0)
pH, Ven: 7.403 (ref 7.250–7.430)
pO2, Ven: 28 mmHg — CL (ref 32.0–45.0)

## 2018-08-05 LAB — CBC WITH DIFFERENTIAL/PLATELET
Abs Immature Granulocytes: 0.02 10*3/uL (ref 0.00–0.07)
Basophils Absolute: 0.1 10*3/uL (ref 0.0–0.1)
Basophils Relative: 1 %
Eosinophils Absolute: 0.3 10*3/uL (ref 0.0–0.5)
Eosinophils Relative: 4 %
HCT: 37 % — ABNORMAL LOW (ref 39.0–52.0)
Hemoglobin: 11.2 g/dL — ABNORMAL LOW (ref 13.0–17.0)
Immature Granulocytes: 0 %
Lymphocytes Relative: 22 %
Lymphs Abs: 1.6 10*3/uL (ref 0.7–4.0)
MCH: 27.2 pg (ref 26.0–34.0)
MCHC: 30.3 g/dL (ref 30.0–36.0)
MCV: 89.8 fL (ref 80.0–100.0)
MONO ABS: 0.5 10*3/uL (ref 0.1–1.0)
Monocytes Relative: 6 %
Neutro Abs: 4.8 10*3/uL (ref 1.7–7.7)
Neutrophils Relative %: 67 %
Platelets: 269 10*3/uL (ref 150–400)
RBC: 4.12 MIL/uL — ABNORMAL LOW (ref 4.22–5.81)
RDW: 14.5 % (ref 11.5–15.5)
WBC: 7.2 10*3/uL (ref 4.0–10.5)
nRBC: 0 % (ref 0.0–0.2)

## 2018-08-05 LAB — POCT ACTIVATED CLOTTING TIME: Activated Clotting Time: 147 seconds

## 2018-08-05 SURGERY — RIGHT HEART CATH

## 2018-08-05 MED ORDER — HEPARIN (PORCINE) IN NACL 1000-0.9 UT/500ML-% IV SOLN
INTRAVENOUS | Status: DC | PRN
  Administered 2018-08-05: 500 mL

## 2018-08-05 MED ORDER — LIDOCAINE HCL (PF) 1 % IJ SOLN
INTRAMUSCULAR | Status: DC | PRN
  Administered 2018-08-05: 2 mL via INTRADERMAL

## 2018-08-05 MED ORDER — FENTANYL CITRATE (PF) 100 MCG/2ML IJ SOLN
INTRAMUSCULAR | Status: AC
  Filled 2018-08-05: qty 2

## 2018-08-05 MED ORDER — HEPARIN (PORCINE) IN NACL 1000-0.9 UT/500ML-% IV SOLN
INTRAVENOUS | Status: AC
  Filled 2018-08-05: qty 500

## 2018-08-05 MED ORDER — SODIUM CHLORIDE 0.9 % IV SOLN: 250.0000 mL | INTRAVENOUS | Status: DC | PRN

## 2018-08-05 MED ORDER — SODIUM CHLORIDE 0.9% FLUSH
3.0000 mL | Freq: Two times a day (BID) | INTRAVENOUS | Status: DC
  Administered 2018-08-05 – 2018-08-09 (×9): 3 mL via INTRAVENOUS

## 2018-08-05 MED ORDER — MIDAZOLAM HCL 2 MG/2ML IJ SOLN
INTRAMUSCULAR | Status: AC
  Filled 2018-08-05: qty 2

## 2018-08-05 MED ORDER — FENTANYL CITRATE (PF) 100 MCG/2ML IJ SOLN
INTRAMUSCULAR | Status: DC | PRN
  Administered 2018-08-05: 25 ug via INTRAVENOUS

## 2018-08-05 MED ORDER — SODIUM CHLORIDE 0.9% FLUSH: 3.0000 mL | INTRAVENOUS | Status: DC | PRN

## 2018-08-05 MED ORDER — LIDOCAINE HCL (PF) 1 % IJ SOLN
INTRAMUSCULAR | Status: AC
  Filled 2018-08-05: qty 30

## 2018-08-05 MED ORDER — MIDAZOLAM HCL 2 MG/2ML IJ SOLN
INTRAMUSCULAR | Status: DC | PRN
  Administered 2018-08-05: 1 mg via INTRAVENOUS

## 2018-08-05 SURGICAL SUPPLY — 6 items
CATH BALLN WEDGE 5F 110CM (CATHETERS) ×1 IMPLANT
GUIDEWIRE .025 260CM (WIRE) ×1 IMPLANT
KIT HEART LEFT (KITS) ×1 IMPLANT
PACK CARDIAC CATHETERIZATION (CUSTOM PROCEDURE TRAY) ×1 IMPLANT
SHEATH GLIDE SLENDER 4/5FR (SHEATH) ×1 IMPLANT
TRANSDUCER W/STOPCOCK (MISCELLANEOUS) ×1 IMPLANT

## 2018-08-05 NOTE — Telephone Encounter (Signed)
Spoke with PR who is rounding on Pt in the hospital.  Advised of new Atrial flutter found on remote check.  Aflutter found on remote started on July 24, 2018.  No end of atrial flutter identified at this point.  Per Dr. Ladona Ridgel- advised Pt f/u with afib clinic and him to discuss ablation (per device nurse).  Will send information to PR.

## 2018-08-05 NOTE — Progress Notes (Signed)
Progress Note  Patient Name: [name] Date of Encounter: 08/05/2018  Primary Cardiologist: Rollene Rotunda, MD   Subjective   Breathing is not at baseline   No CP    Inpatient Medications    Scheduled Meds: . amiodarone  200 mg Oral Daily  . aspirin EC  81 mg Oral Daily  . atorvastatin  80 mg Oral Daily  . calcitRIOL  0.25 mcg Oral Q M,W,F  . carvedilol  25 mg Oral BID WC  . cholecalciferol  1,000 Units Oral Daily  . febuxostat  80 mg Oral Daily  . fluticasone  1 spray Each Nare Daily  . heparin  5,000 Units Subcutaneous Q8H  . hydrALAZINE  25 mg Oral TID  . isosorbide mononitrate  60 mg Oral Daily  . mometasone-formoterol  2 puff Inhalation BID  . ranolazine  500 mg Oral BID  . sodium chloride flush  3 mL Intravenous Q12H   Continuous Infusions: . sodium chloride     PRN Meds: sodium chloride, acetaminophen **OR** acetaminophen, alum & mag hydroxide-simeth, morphine injection, nitroGLYCERIN, ondansetron **OR** ondansetron (ZOFRAN) IV, sodium chloride, sodium chloride flush   Vital Signs    Vitals:   08/05/18 0830 08/05/18 0832 08/05/18 0846 08/05/18 0902  BP: (!) 162/107 (!) 157/119 (!) 156/100 (!) 148/106  Pulse: 60 (!) 59 (!) 57 (!) 58  Resp:      Temp:      TempSrc:      SpO2: 99% 100% 100% 97%  Weight:      Height:        Intake/Output Summary (Last 24 hours) at 08/05/2018 1054 Last data filed at 08/05/2018 0900 Gross per 24 hour  Intake 1860 ml  Output 1750 ml  Net 110 ml   Filed Weights   08/04/18 0438 08/05/18 0558  Weight: 89.4 kg 87.7 kg    Telemetry    SR - Personally Reviewed  ECG      Physical Exam   GEN: No acute distress.   Neck:  JVP is increased   Cardiac: RRR, no murmurs, rubs, or gallops.  Respiratory: Mild rhonchi  GI: Soft, nontender, non-distended  MS: No edema; No deformity. Neuro:  Nonfocal  Psych: Normal affect   Labs    Chemistry Recent Labs  Lab 08/03/18 0312 08/04/18 0818 08/05/18 0505  NA 138  140 138  K 3.8 4.0 3.6  CL 99 99 96*  CO2 27 31 32  GLUCOSE 164* 111* 100*  BUN 53* 49* 47*  CREATININE 3.80* 3.54* 3.68*  CALCIUM 8.3* 8.2* 8.5*  GFRNONAA 17* 19* 18*  GFRAA 20* 22* 21*  ANIONGAP 12 10 10      Hematology Recent Labs  Lab 08/03/18 0312 08/04/18 0818 08/05/18 0505  WBC 6.8 6.5 7.2  RBC 3.90* 3.76* 4.12*  HGB 10.7* 10.7* 11.2*  HCT 36.2* 34.2* 37.0*  MCV 92.8 91.0 89.8  MCH 27.4 28.5 27.2  MCHC 29.6* 31.3 30.3  RDW 14.4 14.5 14.5  PLT 204 220 269    Cardiac Enzymes Recent Labs  Lab 08/03/18 0834 08/03/18 1359  TROPONINI 0.14* 0.15*    Recent Labs  Lab 08/03/18 0325 08/03/18 0643  TROPIPOC 0.21* 0.26*     BNP Recent Labs  Lab 08/03/18 0312  BNP 3,686.6*     DDimer No results for input(s): DDIMER in the last 168 hours.   Radiology    No results found.  Cardiac Studies     Patient Profile      52 y.o.  male with a history of CAD, mixed ICM/NICM, HFrEF, NSVT s/p ICD, CKD IV, HTN, and HL, who is being seen today for the evaluation of chest pain and CHF at the request of Dr. Jomarie Longs.  Assessment & Plan    1   Acute on chronic systolic CHF.  Hx NICM   LVEF 15%  R heart cath  Today as noted above   Continue diuresis   2  Atrial flutter   Remoted check of device reported to sho atrial flutter    This was last week   IN SR  Now   I am not sure of duration   WIll have EP review   Pt will need to be on anticoagulation   Also, this may have been trigger for CHF exacerbation    ? If ablation something to consider.    2  CP   / CAD  / elevated troponin  Trivial bump in troponin  prob from CHF exacerbation    3   HTN  BP is up today   Continue diuresis and follow     4   HL  COntinue statin rx   5  CKD   Stage IV Follows with nephrology in [place]    Will need to follow closeley  For questions or updates, please contact [facility] Please consult [email or web address] for contact info under        Signed, Dietrich Pates, MD  08/05/2018, 10:54  AM

## 2018-08-05 NOTE — Plan of Care (Signed)
  Problem: Education: Goal: Ability to verbalize understanding of medication therapies will improve Outcome: Progressing   Problem: Education: Goal: Ability to demonstrate management of disease process will improve Outcome: Progressing   Problem: Cardiac: Goal: Ability to achieve and maintain adequate cardiovascular perfusion will improve Outcome: Progressing

## 2018-08-05 NOTE — Progress Notes (Signed)
[facility]                                                                              Patient Demographics  Maximiano Lott, is a 52 y.o. male, DOB - [birth year], ZOX:[id number]  Admit date - 08/03/2018   Admitting Physician Lorretta Harp, MD  Outpatient Primary MD for the patient is Joette Catching, MD  Outpatient specialists:   LOS - 2  days   Medical records reviewed and are as summarized below:    Chief Complaint  Patient presents with  . 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 Patient reports compliance with medications and low-salt diet. Pt describes his chest pain as sharp at times and pressure-like on other occasions,midsternal,nonradiating,partly relieved by nitroglycerin given in the emergency room. In the ED, pt was found to have worsening kidney disease with creatinine of 3.8, troponin only mildly elevated at 0.2, elevated BNP and chest x-ray concerning for fluid overload. EKG with Q waves in inferior and anterior leads, prolonged QTC. Cardiology consulted. Pt admitted for further management.   Assessment & Plan    Acute on chronic systolic CHF with history of an ICM, EF 15% -Presented with BNP of 3686, chest x-ray with pulmonary edema -Continue IV diuresis, cardiology consulted, recommended cardiac cath -Cardiac cath today with findings consistent with the severe pulmonary  hypertension   History of CAD with chest pain, elevated troponin Cardiology following, possibly demand ischemia from #1, cardiac cath pending Last cath 2012 with occluded RCA with collaterals and nonobstructive disease in LAD and circumflex -Continue aspirin, Coreg, Lipitor, Imdur, hydralazine -2D echo 08/04/2018 showed EF of 15 to 20% with grade 2 diastolic dysfunction, pulmonary hypertension, PA pressure 53   Acute kidney injury on CKD stage IV -Baseline creatinine around 3, possibly cardiorenal syndrome, follows Dr. Kristian Covey in [place] Presented with creatinine of 3.8, creatinine improving, 3.6, follow closely, may need to consult nephrology, if worsens.   Anemia of chronic disease H&H currently stable  Essential hypertension Stable, continue Coreg, Imdur, hydralazine   Code Status: Full CODE STATUS DVT Prophylaxis: Heparin subcu Family Communication: Discussed in detail with the patient, all imaging results, lab results explained to the patient    Disposition Plan:   Time Spent in minutes     Procedures:  Cardiac cath 12/3  Consultants:   Cardiology  Antimicrobials:      Medications  Scheduled Meds: . amiodarone  200 mg Oral Daily  . aspirin EC  81 mg Oral Daily  . atorvastatin  80 mg Oral Daily  . calcitRIOL  0.25 mcg Oral Q M,W,F  . carvedilol  25 mg Oral BID WC  .  cholecalciferol  1,000 Units Oral Daily  . febuxostat  80 mg Oral Daily  . fluticasone  1 spray Each Nare Daily  . heparin  5,000 Units Subcutaneous Q8H  . hydrALAZINE  25 mg Oral TID  . isosorbide mononitrate  60 mg Oral Daily  . mometasone-formoterol  2 puff Inhalation BID  . ranolazine  500 mg Oral BID  . sodium chloride flush  3 mL Intravenous Q12H   Continuous Infusions: . sodium chloride     PRN Meds:.sodium chloride, acetaminophen **OR** acetaminophen, alum & mag hydroxide-simeth, morphine injection, nitroGLYCERIN, ondansetron **OR** ondansetron (ZOFRAN) IV, sodium chloride,  sodium chloride flush   Antibiotics   Anti-infectives (From admission, onward)   None        Subjective:   Townsend Flocco was seen and examined today.  Seen after cardiac cath, denies dizziness, chest pain, shortness of breath, abdominal pain, N/V/D/C, new weakness, numbess, tingling. No acute events overnight.    Objective:   Vitals:   08/05/18 0830 08/05/18 0832 08/05/18 0846 08/05/18 0902  BP: (!) 162/107 (!) 157/119 (!) 156/100 (!) 148/106  Pulse: 60 (!) 59 (!) 57 (!) 58  Resp:      Temp:      TempSrc:      SpO2: 99% 100% 100% 97%  Weight:      Height:        Intake/Output Summary (Last 24 hours) at 08/05/2018 1358 Last data filed at 08/05/2018 1301 Gross per 24 hour  Intake 2160 ml  Output 1600 ml  Net 560 ml     Wt Readings from Last 3 Encounters:  08/05/18 87.7 kg  05/15/18 92.1 kg  03/19/18 94.8 kg     Exam  General: Alert and oriented x 3, NAD  Eyes: PERRLA, EOMI, Anicteric Sclera,  HEENT:    Cardiovascular: S1 S2 auscultated,  Regular rate and rhythm.  Respiratory: Decreased breath sound at the bases, bibasilar Rales  Gastrointestinal: Soft, nontender, nondistended, + bowel sounds  Ext: no pedal edema bilaterally  Neuro: No new deficit  Musculoskeletal: No digital cyanosis, clubbing  Skin: No rashes  Psych: Normal affect and demeanor, alert and oriented x3    Data Reviewed:  I have personally reviewed following labs and imaging studies  Micro Results Recent Results (from the past 240 hour(s))  Respiratory Panel by PCR     Status: Abnormal   Collection Time: 08/04/18  2:06 PM  Result Value Ref Range Status   Adenovirus NOT DETECTED NOT DETECTED Final   Coronavirus 229E NOT DETECTED NOT DETECTED Final   Coronavirus HKU1 NOT DETECTED NOT DETECTED Final   Coronavirus NL63 NOT DETECTED NOT DETECTED Final   Coronavirus OC43 NOT DETECTED NOT DETECTED Final   Metapneumovirus NOT DETECTED NOT DETECTED Final   Rhinovirus / Enterovirus  DETECTED (A) NOT DETECTED Final   Influenza A NOT DETECTED NOT DETECTED Final   Influenza B NOT DETECTED NOT DETECTED Final   Parainfluenza Virus 1 NOT DETECTED NOT DETECTED Final   Parainfluenza Virus 2 NOT DETECTED NOT DETECTED Final   Parainfluenza Virus 3 NOT DETECTED NOT DETECTED Final   Parainfluenza Virus 4 NOT DETECTED NOT DETECTED Final   Respiratory Syncytial Virus NOT DETECTED NOT DETECTED Final   Bordetella pertussis NOT DETECTED NOT DETECTED Final   Chlamydophila pneumoniae NOT DETECTED NOT DETECTED Final   Mycoplasma pneumoniae NOT DETECTED NOT DETECTED Final    Comment: Performed at [facility], [address], [place], [place] [address]    Radiology Reports  Dg Chest 2 View  Result Date: 08/03/2018 CLINICAL DATA:  Initial evaluation for acute chest pain. EXAM: CHEST - 2 VIEW COMPARISON:  Prior radiograph from 01/31/2018 FINDINGS: Left-sided pacemaker/AICD in place. Moderate to severe cardiomegaly, unchanged. Mediastinal silhouette normal. Lungs hypoinflated. Diffuse pulmonary vascular congestion with interstitial prominence, compatible with pulmonary interstitial edema. No appreciable pleural effusion. No consolidative airspace disease. No pneumothorax. No acute osseous abnormality. IMPRESSION: Cardiomegaly with mild to moderate diffuse pulmonary interstitial edema. Electronically Signed   By: Rise Mu M.D.   On: 08/03/2018 04:36    Lab Data:  CBC: Recent Labs  Lab 08/03/18 0312 08/04/18 0818 08/05/18 0505  WBC 6.8 6.5 7.2  NEUTROABS 4.8 4.6 4.8  HGB 10.7* 10.7* 11.2*  HCT 36.2* 34.2* 37.0*  MCV 92.8 91.0 89.8  PLT 204 220 269   Basic Metabolic Panel: Recent Labs  Lab 08/03/18 0312 08/04/18 0818 08/05/18 0505  NA 138 140 138  K 3.8 4.0 3.6  CL 99 99 96*  CO2 27 31 32  GLUCOSE 164* 111* 100*  BUN 53* 49* 47*  CREATININE 3.80* 3.54* 3.68*  CALCIUM 8.3* 8.2* 8.5*   GFR: Estimated Creatinine Clearance: 26.5 mL/min (A) (by C-G formula  based on SCr of 3.68 mg/dL (H)). Liver Function Tests: No results for input(s): AST, ALT, ALKPHOS, BILITOT, PROT, ALBUMIN in the last 168 hours. No results for input(s): LIPASE, AMYLASE in the last 168 hours. No results for input(s): AMMONIA in the last 168 hours. Coagulation Profile: Recent Labs  Lab 08/03/18 0312  INR 1.50   Cardiac Enzymes: Recent Labs  Lab 08/03/18 0834 08/03/18 1359  TROPONINI 0.14* 0.15*   BNP (last 3 results) No results for input(s): PROBNP in the last 8760 hours. HbA1C: No results for input(s): HGBA1C in the last 72 hours. CBG: No results for input(s): GLUCAP in the last 168 hours. Lipid Profile: No results for input(s): CHOL, HDL, LDLCALC, TRIG, CHOLHDL, LDLDIRECT in the last 72 hours. Thyroid Function Tests: No results for input(s): TSH, T4TOTAL, FREET4, T3FREE, THYROIDAB in the last 72 hours. Anemia Panel: No results for input(s): VITAMINB12, FOLATE, FERRITIN, TIBC, IRON, RETICCTPCT in the last 72 hours. Urine analysis:    Component Value Date/Time   COLORURINE AMBER (A) 05/10/2013 0404   APPEARANCEUR CLOUDY (A) 05/10/2013 0404   LABSPEC 1.012 05/10/2013 0404   PHURINE 5.5 05/10/2013 0404   GLUCOSEU NEGATIVE 05/10/2013 0404   HGBUR TRACE (A) 05/10/2013 0404   BILIRUBINUR NEGATIVE 05/10/2013 0404   KETONESUR NEGATIVE 05/10/2013 0404   PROTEINUR 100 (A) 05/10/2013 0404   UROBILINOGEN 1.0 05/10/2013 0404   NITRITE NEGATIVE 05/10/2013 0404   LEUKOCYTESUR TRACE (A) 05/10/2013 0404     Loyde Orth M.D. [facility] 08/05/2018, 1:58 PM  Pager: [phone] Between 7am to 7pm - call Pager - [phone]  After 7pm go to [email or web address] - password [email or web address]  Call night coverage person covering after 7pm

## 2018-08-05 NOTE — Interval H&P Note (Signed)
History and Physical Interval Note:  08/05/2018 7:46 AM  [name]  has presented today for right heart cath with the diagnosis of CHF. The various methods of treatment have been discussed with the patient and family. After consideration of risks, benefits and other options for treatment, the patient has consented to  Procedure(s): RIGHT HEART CATH (N/A) as a surgical intervention .  The patient's history has been reviewed, patient examined, no change in status, stable for surgery.  I have reviewed the patient's chart and labs.  Questions were answered to the patient's satisfaction.     Verne Carrow

## 2018-08-06 LAB — CBC WITH DIFFERENTIAL/PLATELET
Abs Immature Granulocytes: 0.03 10*3/uL (ref 0.00–0.07)
Basophils Absolute: 0.1 10*3/uL (ref 0.0–0.1)
Basophils Relative: 1 %
Eosinophils Absolute: 0.2 10*3/uL (ref 0.0–0.5)
Eosinophils Relative: 3 %
HCT: 39.5 % (ref 39.0–52.0)
Hemoglobin: 12.2 g/dL — ABNORMAL LOW (ref 13.0–17.0)
IMMATURE GRANULOCYTES: 0 %
LYMPHS PCT: 22 %
Lymphs Abs: 1.7 10*3/uL (ref 0.7–4.0)
MCH: 27.5 pg (ref 26.0–34.0)
MCHC: 30.9 g/dL (ref 30.0–36.0)
MCV: 89.2 fL (ref 80.0–100.0)
Monocytes Absolute: 0.5 10*3/uL (ref 0.1–1.0)
Monocytes Relative: 6 %
NEUTROS PCT: 68 %
Neutro Abs: 5.2 10*3/uL (ref 1.7–7.7)
Platelets: 303 10*3/uL (ref 150–400)
RBC: 4.43 MIL/uL (ref 4.22–5.81)
RDW: 14.7 % (ref 11.5–15.5)
WBC: 7.7 10*3/uL (ref 4.0–10.5)
nRBC: 0 % (ref 0.0–0.2)

## 2018-08-06 LAB — BASIC METABOLIC PANEL
ANION GAP: 14 (ref 5–15)
BUN: 50 mg/dL — ABNORMAL HIGH (ref 6–20)
CO2: 29 mmol/L (ref 22–32)
Calcium: 9 mg/dL (ref 8.9–10.3)
Chloride: 97 mmol/L — ABNORMAL LOW (ref 98–111)
Creatinine, Ser: 3.62 mg/dL — ABNORMAL HIGH (ref 0.61–1.24)
GFR calc Af Amer: 21 mL/min — ABNORMAL LOW (ref >60)
GFR calc non Af Amer: 18 mL/min — ABNORMAL LOW (ref >60)
Glucose, Bld: 118 mg/dL — ABNORMAL HIGH (ref 70–99)
POTASSIUM: 3.8 mmol/L (ref 3.5–5.1)
Sodium: 140 mmol/L (ref 135–145)

## 2018-08-06 MED ORDER — RIVAROXABAN 15 MG PO TABS
15.0000 mg | ORAL_TABLET | Freq: Every day | ORAL | Status: DC
  Administered 2018-08-06 – 2018-08-08 (×3): 15 mg via ORAL
  Filled 2018-08-06 (×3): qty 1

## 2018-08-06 MED ORDER — FUROSEMIDE 10 MG/ML IJ SOLN
80.0000 mg | Freq: Two times a day (BID) | INTRAMUSCULAR | Status: DC
  Administered 2018-08-06 – 2018-08-08 (×5): 80 mg via INTRAVENOUS
  Filled 2018-08-06 (×5): qty 8

## 2018-08-06 MED ORDER — HYDRALAZINE HCL 50 MG PO TABS
50.0000 mg | ORAL_TABLET | Freq: Three times a day (TID) | ORAL | Status: DC
  Administered 2018-08-06 – 2018-08-07 (×3): 50 mg via ORAL
  Filled 2018-08-06 (×3): qty 1

## 2018-08-06 NOTE — Progress Notes (Signed)
SATURATION QUALIFICATIONS: (This note is used to comply with regulatory documentation for home oxygen)  Patient Saturations on Room Air at Rest =97%  Patient Saturations on Room Air while Ambulating = 92%   Pt dropped down for not even a minute before going back up to 95% while ambulating

## 2018-08-06 NOTE — Consult Note (Signed)
[facility]  HISTORY AND PHYSICAL  [name] is an 52 y.o. male.    Chief Complaint: DOE  HPI: Pt is a 43M with a PMH significant for cardiomyopathy with EF 15%, HTN, DM, CAD, CKD Stage IV with baseline cr of ~3-3.5, h/o VT and SVT who is now seen in consultation at the request of Dr. Isidoro Donning for evaluation and recommendations surrounding diuresis in the setting of CKD.    Pt is followed by Dr. Kristian Covey but hasn't been seen in > 1 year.  Last Cr in our system was 3.5 01/2018.  He presented to [facility] for 3-day history of SOB and increased LE edema.  Was found to have acute on chronic CHF exacerbation, CXR with pulmonary edema.  Was diuresed with IV Lasix with good effect.  RHC yesterday with severe pTHN and elevated PCWP at 29. Cr this AM is 3.6.  In this setting we are asked to see.    Pt reports that the swelling in his legs and abd is better.  Weight down from 197--> 193 lbs this AM.  Off O2 on my eval.    PMH: Past Medical History:  Diagnosis Date  . AICD (automatic cardioverter/defibrillator) present    a. 01/2018 BSX Vigilant EL dual chamber AICD. Ser# [id number].  Marland Kitchen Anemia    a. 2008 in setting of profound epistaxis.  Marland Kitchen CAD (coronary artery disease)    a. NSTEMI 2008: in setting of profound epistaxis/anemia, cath showing severe diffuse RCA dz with L-R collaterals, nonobst dz in left system, managed medically; b. Type II NSTEMI 10/2008; c. Cath 2012 - for med rx; d. 05/2013: elevated troponin ? due to ARF;  e. Ranexa added 03/2012.  Marland Kitchen CKD (chronic kidney disease), stage IV (HCC)    a. Average creatine of about 2.3  . Epistaxis    a. 2008, 2009 a/w significant anemia.  . HFrEF (heart failure with reduced ejection fraction) (HCC)   . History of pneumonia 08/2012, 05/2013  . Hyperlipidemia   . Hypertension    a. Difficult to control. b. Renal duplex neg for RAS 11/2012.  . Mixed ischemic and nonischemic cardiomyopathy (HCC)    a. Mixed ICM/NICM - Prior EF 25% back to 2008; b.  Improved to 55% in 2012; c. Subsequent echoes: 45% in 11/2010, 15% by echo 05/2013 & 2016;  d. 12/2017 Echo: EF 15%, restrictive phys, mild conc and mod focal basal septal LVH. Mild AI. Sev BAE. Sev red RV fxn. Mod tR. PASP .  Marland Kitchen NSVT (nonsustained ventricular tachycardia) (HCC)    a. s/p ICD.  On amio.  Marland Kitchen SVT (supraventricular tachycardia) (HCC)    a. 05/2013 tx with 6mg  adenosine in ER.   PSH: Past Surgical History:  Procedure Laterality Date  . CARDIAC CATHETERIZATION  2012   total RCA occlusion with left-to-right collaterals supplying the distal RCA branches, mild diffuse nonobstructive disease of LAD and LCx. Medically managed  . ICD IMPLANT N/A 01/30/2018   Procedure: ICD IMPLANT;  Surgeon: Marinus Maw, MD;  Location: [facility] INVASIVE CV LAB;  Service: Cardiovascular;  Laterality: N/A;  . RIGHT HEART CATH N/A 08/05/2018   Procedure: RIGHT HEART CATH;  Surgeon: Kathleene Hazel, MD;  Location: [facility] INVASIVE CV LAB;  Service: Cardiovascular;  Laterality: N/A;     Past Medical History:  Diagnosis Date  . AICD (automatic cardioverter/defibrillator) present    a. 01/2018 BSX Vigilant EL dual chamber AICD. Ser# [id number].  Marland Kitchen Anemia    a. 2008 in setting  of profound epistaxis.  Marland Kitchen CAD (coronary artery disease)    a. NSTEMI 2008: in setting of profound epistaxis/anemia, cath showing severe diffuse RCA dz with L-R collaterals, nonobst dz in left system, managed medically; b. Type II NSTEMI 10/2008; c. Cath 2012 - for med rx; d. 05/2013: elevated troponin ? due to ARF;  e. Ranexa added 03/2012.  Marland Kitchen CKD (chronic kidney disease), stage IV (HCC)    a. Average creatine of about 2.3  . Epistaxis    a. 2008, 2009 a/w significant anemia.  . HFrEF (heart failure with reduced ejection fraction) (HCC)   . History of pneumonia 08/2012, 05/2013  . Hyperlipidemia   . Hypertension    a. Difficult to control. b. Renal duplex neg for RAS 11/2012.  . Mixed ischemic and nonischemic cardiomyopathy (HCC)    a.  Mixed ICM/NICM - Prior EF 25% back to 2008; b. Improved to 55% in 2012; c. Subsequent echoes: 45% in 11/2010, 15% by echo 05/2013 & 2016;  d. 12/2017 Echo: EF 15%, restrictive phys, mild conc and mod focal basal septal LVH. Mild AI. Sev BAE. Sev red RV fxn. Mod tR. PASP .  Marland Kitchen NSVT (nonsustained ventricular tachycardia) (HCC)    a. s/p ICD.  On amio.  Marland Kitchen SVT (supraventricular tachycardia) (HCC)    a. 05/2013 tx with 6mg  adenosine in ER.    Medications:   Scheduled: . amiodarone  200 mg Oral Daily  . atorvastatin  80 mg Oral Daily  . calcitRIOL  0.25 mcg Oral Q M,W,F  . carvedilol  25 mg Oral BID WC  . cholecalciferol  1,000 Units Oral Daily  . febuxostat  80 mg Oral Daily  . fluticasone  1 spray Each Nare Daily  . furosemide  80 mg Intravenous BID  . hydrALAZINE  50 mg Oral TID  . isosorbide mononitrate  60 mg Oral Daily  . mometasone-formoterol  2 puff Inhalation BID  . ranolazine  500 mg Oral BID  . rivaroxaban  15 mg Oral Q supper  . sodium chloride flush  3 mL Intravenous Q12H    Medications Prior to Admission  Medication Sig Dispense Refill  . amiodarone (PACERONE) 200 MG tablet Take 200 mg by mouth daily.    Marland Kitchen aspirin 81 MG tablet Take 1 tablet (81 mg total) by mouth daily.    Marland Kitchen atorvastatin (LIPITOR) 80 MG tablet Take 80 mg by mouth daily.    . budesonide-formoterol (SYMBICORT) 160-4.5 MCG/ACT inhaler Inhale 2 puffs into the lungs 2 (two) times daily.    . calcitRIOL (ROCALTROL) 0.25 MCG capsule Take 0.25 mcg by mouth every Monday, Wednesday, and Friday.     . carvedilol (COREG) 25 MG tablet Take 1 tablet (25 mg total) by mouth 2 (two) times daily with a meal. 180 tablet 3  . cholecalciferol (VITAMIN D) 1000 UNITS tablet Take 1,000 Units by mouth daily.    . Febuxostat (ULORIC) 80 MG TABS Take 80 mg by mouth daily.     . hydrALAZINE (APRESOLINE) 25 MG tablet Take 1 tablet (25 mg total) by mouth 2 (two) times daily. 180 tablet 2  . isosorbide mononitrate (IMDUR) 60 MG 24 hr  tablet Take 1 tablet (60 mg total) by mouth daily. 90 tablet 1  . metolazone (ZAROXOLYN) 5 MG tablet Take 5 mg by mouth daily.     . nitroGLYCERIN (NITROSTAT) 0.4 MG SL tablet Place 1 tablet (0.4 mg total) under the tongue every 5 (five) minutes as needed for chest pain. KEEP OV.  25 tablet 0  . potassium chloride SA (K-DUR,KLOR-CON) 20 MEQ tablet TAKE ONE TABLET BY MOUTH 2 TIMES A DAY (Patient taking differently: Take 20 mEq by mouth 2 (two) times daily. ) 60 tablet 5  . ranolazine (RANEXA) 500 MG 12 hr tablet TAKE ONE TABLET BY MOUTH 2 TIMES A DAY **CALL OFFICE TO MAKE APPOINTMENT** (Patient taking differently: Take 500 mg by mouth 2 (two) times daily. ) 60 tablet 2  . torsemide (DEMADEX) 20 MG tablet Take 20 mg by mouth daily.      ALLERGIES:  No Known Allergies  FAM HX: Family History  Problem Relation Age of Onset  . Hypertension Mother        deceased  . Diabetes Mother        deceased  . Heart disease Mother        deceased  . Heart attack Father        at age 22, deceased    Social History:   reports that he quit smoking about 11 years ago. His smoking use included cigarettes. He has a 10.00 pack-year smoking history. He has never used smokeless tobacco. He reports that he has current or past drug history. He reports that he does not drink alcohol.  ROS: ROS: all other systems reviewed and are negative except as per HPI  Blood pressure 120/71, pulse (!) 57, temperature 98 F (36.7 C), temperature source Oral, resp. rate 18, height 5\' 10"  (1.778 m), weight 87.7 kg, SpO2 94 %. PHYSICAL EXAM: Physical Exam  GEN NAD, lying flat in bed HEENT EOMI PERRL NECK no obvious JVD PULM faint bilateral crackles , R > L CV RRR, no m/r/g ABD soft, slightly distended EXT trace LE edema NEURO AAO x 3 nonfocal SKIN warm and dry, no rashes   Results for orders placed or performed during the hospital encounter of 08/03/18 (from the past 48 hour(s))  CBC with Differential/Platelet      Status: Abnormal   Collection Time: 08/05/18  5:05 AM  Result Value Ref Range   WBC 7.2 4.0 - 10.5 K/uL   RBC 4.12 (L) 4.22 - 5.81 MIL/uL   Hemoglobin 11.2 (L) 13.0 - 17.0 g/dL   HCT 78.2 (L) 95.6 - 21.3 %   MCV 89.8 80.0 - 100.0 fL   MCH 27.2 26.0 - 34.0 pg   MCHC 30.3 30.0 - 36.0 g/dL   RDW 08.6 57.8 - 46.9 %   Platelets 269 150 - 400 K/uL   nRBC 0.0 0.0 - 0.2 %   Neutrophils Relative % 67 %   Neutro Abs 4.8 1.7 - 7.7 K/uL   Lymphocytes Relative 22 %   Lymphs Abs 1.6 0.7 - 4.0 K/uL   Monocytes Relative 6 %   Monocytes Absolute 0.5 0.1 - 1.0 K/uL   Eosinophils Relative 4 %   Eosinophils Absolute 0.3 0.0 - 0.5 K/uL   Basophils Relative 1 %   Basophils Absolute 0.1 0.0 - 0.1 K/uL   Immature Granulocytes 0 %   Abs Immature Granulocytes 0.02 0.00 - 0.07 K/uL    Comment: Performed at [facility], [address], [place], [place] [address]  Basic metabolic panel     Status: Abnormal   Collection Time: 08/05/18  5:05 AM  Result Value Ref Range   Sodium 138 135 - 145 mmol/L   Potassium 3.6 3.5 - 5.1 mmol/L   Chloride 96 (L) 98 - 111 mmol/L   CO2 32 22 - 32 mmol/L  Glucose, Bld 100 (H) 70 - 99 mg/dL   BUN 47 (H) 6 - 20 mg/dL   Creatinine, Ser 2.95 (H) 0.61 - 1.24 mg/dL   Calcium 8.5 (L) 8.9 - 10.3 mg/dL   GFR calc non Af Amer 18 (L) >60 mL/min   GFR calc Af Amer 21 (L) >60 mL/min   Anion gap 10 5 - 15    Comment: Performed at [facility], [address] [place], [place] [address]  POCT Activated clotting time     Status: None   Collection Time: 08/05/18  8:03 AM  Result Value Ref Range   Activated Clotting Time 147 seconds  I-STAT 3, venous blood gas (G3P V)     Status: Abnormal   Collection Time: 08/05/18  8:05 AM  Result Value Ref Range   pH, Ven 7.403 7.250 - 7.430   pCO2, Ven 53.8 44.0 - 60.0 mmHg   pO2, Ven 28.0 (LL) 32.0 - 45.0 mmHg   Bicarbonate 33.5 (H) 20.0 - 28.0 mmol/L   TCO2 35 (H) 22 - 32 mmol/L   O2 Saturation 52.0 %   Acid-Base Excess 7.0  (H) 0.0 - 2.0 mmol/L   Patient temperature HIDE    Sample type VENOUS    Comment NOTIFIED PHYSICIAN   CBC with Differential/Platelet     Status: Abnormal   Collection Time: 08/06/18  3:50 AM  Result Value Ref Range   WBC 7.7 4.0 - 10.5 K/uL   RBC 4.43 4.22 - 5.81 MIL/uL   Hemoglobin 12.2 (L) 13.0 - 17.0 g/dL   HCT 86.5 78.4 - 69.6 %   MCV 89.2 80.0 - 100.0 fL   MCH 27.5 26.0 - 34.0 pg   MCHC 30.9 30.0 - 36.0 g/dL   RDW 29.5 28.4 - 13.2 %   Platelets 303 150 - 400 K/uL   nRBC 0.0 0.0 - 0.2 %   Neutrophils Relative % 68 %   Neutro Abs 5.2 1.7 - 7.7 K/uL   Lymphocytes Relative 22 %   Lymphs Abs 1.7 0.7 - 4.0 K/uL   Monocytes Relative 6 %   Monocytes Absolute 0.5 0.1 - 1.0 K/uL   Eosinophils Relative 3 %   Eosinophils Absolute 0.2 0.0 - 0.5 K/uL   Basophils Relative 1 %   Basophils Absolute 0.1 0.0 - 0.1 K/uL   Immature Granulocytes 0 %   Abs Immature Granulocytes 0.03 0.00 - 0.07 K/uL    Comment: Performed at [facility], [address] [place], [place] [address]  Basic metabolic panel     Status: Abnormal   Collection Time: 08/06/18  3:50 AM  Result Value Ref Range   Sodium 140 135 - 145 mmol/L   Potassium 3.8 3.5 - 5.1 mmol/L   Chloride 97 (L) 98 - 111 mmol/L   CO2 29 22 - 32 mmol/L   Glucose, Bld 118 (H) 70 - 99 mg/dL   BUN 50 (H) 6 - 20 mg/dL   Creatinine, Ser 2.72 (H) 0.61 - 1.24 mg/dL   Calcium 9.0 8.9 - 53.6 mg/dL   GFR calc non Af Amer 18 (L) >60 mL/min   GFR calc Af Amer 21 (L) >60 mL/min   Anion gap 14 5 - 15    Comment: Performed at [facility], [address], [place], [place] [address]    No results found.  Assessment/Plan  1.  Chronic kidney disease, stage IV: Followed by Dr. Kristian Covey but hasn't been seen in > 1 year  per pt.  He is near his baseline creatinine.  I expect some fluctuation in creatinine with diuresis, especially in the setting of his likely underlying cardiorenal syndrome.  I agree with Lasix 80 IV BID as he has been having a  good diuresis so far.  If it stagnates, could in fact increase the dose to 120 IV BID as long as creatinine not too far off baseline.  No ACEi/ARB for now  2.  NICM with EF 15%- per cardiology.  Getting AC for possible apical thrombus  3.  PHTN: on RHC from 12/3.  Severely elevated PA pressures.  Continuing diuresis as above  4.  DM: per primary  5.  Dispo: pending further diuresis  Maksym Pfiffner 08/06/2018, 3:01 PM

## 2018-08-06 NOTE — Progress Notes (Signed)
ANTICOAGULATION CONSULT NOTE - Initial Consult  Pharmacy Consult for Xarelto Indication: A-flutter  No Known Allergies  Patient Measurements: Height: 5\' 10"  (177.8 cm) Weight: 193 lb 6.4 oz (87.7 kg) IBW/kg (Calculated) : 73  Vital Signs: Temp: 97.7 F (36.5 C) (12/04 0431) Temp Source: Oral (12/04 0431) BP: 159/104 (12/04 0828) Pulse Rate: 63 (12/04 0828)  Labs: Recent Labs    08/03/18 1359  08/04/18 0818 08/05/18 0505 08/06/18 0350  HGB  --    < > 10.7* 11.2* 12.2*  HCT  --   --  34.2* 37.0* 39.5  PLT  --   --  220 269 303  CREATININE  --   --  3.54* 3.68* 3.62*  TROPONINI 0.15*  --   --   --   --    < > = values in this interval not displayed.    Estimated Creatinine Clearance: 26.9 mL/min (A) (by C-G formula based on SCr of 3.62 mg/dL (H)).   Medical History: Past Medical History:  Diagnosis Date  . AICD (automatic cardioverter/defibrillator) present    a. 01/2018 BSX Vigilant EL dual chamber AICD. Ser# [id number].  Marland Kitchen Anemia    a. 2008 in setting of profound epistaxis.  Marland Kitchen CAD (coronary artery disease)    a. NSTEMI 2008: in setting of profound epistaxis/anemia, cath showing severe diffuse RCA dz with L-R collaterals, nonobst dz in left system, managed medically; b. Type II NSTEMI 10/2008; c. Cath 2012 - for med rx; d. 05/2013: elevated troponin ? due to ARF;  e. Ranexa added 03/2012.  Marland Kitchen CKD (chronic kidney disease), stage IV (HCC)    a. Average creatine of about 2.3  . Epistaxis    a. 2008, 2009 a/w significant anemia.  . HFrEF (heart failure with reduced ejection fraction) (HCC)   . History of pneumonia 08/2012, 05/2013  . Hyperlipidemia   . Hypertension    a. Difficult to control. b. Renal duplex neg for RAS 11/2012.  . Mixed ischemic and nonischemic cardiomyopathy (HCC)    a. Mixed ICM/NICM - Prior EF 25% back to 2008; b. Improved to 55% in 2012; c. Subsequent echoes: 45% in 11/2010, 15% by echo 05/2013 & 2016;  d. 12/2017 Echo: EF 15%, restrictive phys, mild conc and  mod focal basal septal LVH. Mild AI. Sev BAE. Sev red RV fxn. Mod tR. PASP .  Marland Kitchen NSVT (nonsustained ventricular tachycardia) (HCC)    a. s/p ICD.  On amio.  Marland Kitchen SVT (supraventricular tachycardia) (HCC)    a. 05/2013 tx with 6mg  adenosine in ER.    Medications:  Scheduled:  . amiodarone  200 mg Oral Daily  . atorvastatin  80 mg Oral Daily  . calcitRIOL  0.25 mcg Oral Q M,W,F  . carvedilol  25 mg Oral BID WC  . cholecalciferol  1,000 Units Oral Daily  . febuxostat  80 mg Oral Daily  . fluticasone  1 spray Each Nare Daily  . furosemide  80 mg Intravenous BID  . hydrALAZINE  50 mg Oral TID  . isosorbide mononitrate  60 mg Oral Daily  . mometasone-formoterol  2 puff Inhalation BID  . ranolazine  500 mg Oral BID  . sodium chloride flush  3 mL Intravenous Q12H    Assessment: 52 YOM with atrial flutter and pharmacy consulted to start [facility] with Xarelto.  No AC PTA, also on amiodarone 200mg  daily (P-gp inhibitor and moderate CYP3A4 inhibitor), SCr 3.62, CrCl (TBW) ~ 30 mL/min.    Goal of Therapy:  Monitor platelets  by anticoagulation protocol: Yes   Plan:  Xarelto 15mg  PO each evening with a meal Monitor renal function, s/s bleeding  Daylene Posey 08/06/2018,11:24 AM

## 2018-08-06 NOTE — Progress Notes (Signed)
Progress Note  Patient Name: [name] Date of Encounter: 08/06/2018  Primary Cardiologist: Rollene Rotunda, MD   Subjective   No CP   Still congested with breathing   Inpatient Medications    Scheduled Meds: . amiodarone  200 mg Oral Daily  . aspirin EC  81 mg Oral Daily  . atorvastatin  80 mg Oral Daily  . calcitRIOL  0.25 mcg Oral Q M,W,F  . carvedilol  25 mg Oral BID WC  . cholecalciferol  1,000 Units Oral Daily  . febuxostat  80 mg Oral Daily  . fluticasone  1 spray Each Nare Daily  . heparin  5,000 Units Subcutaneous Q8H  . hydrALAZINE  25 mg Oral TID  . isosorbide mononitrate  60 mg Oral Daily  . mometasone-formoterol  2 puff Inhalation BID  . ranolazine  500 mg Oral BID  . sodium chloride flush  3 mL Intravenous Q12H   Continuous Infusions: . sodium chloride     PRN Meds: sodium chloride, acetaminophen **OR** acetaminophen, alum & mag hydroxide-simeth, morphine injection, nitroGLYCERIN, ondansetron **OR** ondansetron (ZOFRAN) IV, sodium chloride, sodium chloride flush   Vital Signs    Vitals:   08/05/18 1822 08/05/18 1938 08/05/18 2014 08/06/18 0431  BP: (!) 147/114 (!) 138/94  (!) 159/103  Pulse:  (!) 56  68  Resp:  20  20  Temp:  (!) 97.5 F (36.4 C)  97.7 F (36.5 C)  TempSrc:  Oral  Oral  SpO2:  94% 95% 93%  Weight:    87.7 kg  Height:        Intake/Output Summary (Last 24 hours) at 08/06/2018 0812 Last data filed at 08/06/2018 0300 Gross per 24 hour  Intake 940 ml  Output 800 ml  Net 140 ml    Neg 4.3 L so far   [employer]   08/04/18 0438 08/05/18 0558 08/06/18 0431  Weight: 89.4 kg 87.7 kg 87.7 kg    Telemetry    SR - Personally Reviewed  ECG      Physical Exam   GEN: No acute distress.   Neck:  JVP is increased   Cardiac: RRR, no murmurs, rubs, or gallops.  Respiratory: Mild rale   Rhonchi   GI: Soft, nontender, non-distended  MS: Tr edema; No deformity. Neuro:  Nonfocal  Psych: Normal affect   Labs      Chemistry Recent Labs  Lab 08/04/18 0818 08/05/18 0505 08/06/18 0350  NA 140 138 140  K 4.0 3.6 3.8  CL 99 96* 97*  CO2 31 32 29  GLUCOSE 111* 100* 118*  BUN 49* 47* 50*  CREATININE 3.54* 3.68* 3.62*  CALCIUM 8.2* 8.5* 9.0  GFRNONAA 19* 18* 18*  GFRAA 22* 21* 21*  ANIONGAP 10 10 14      Hematology Recent Labs  Lab 08/04/18 0818 08/05/18 0505 08/06/18 0350  WBC 6.5 7.2 7.7  RBC 3.76* 4.12* 4.43  HGB 10.7* 11.2* 12.2*  HCT 34.2* 37.0* 39.5  MCV 91.0 89.8 89.2  MCH 28.5 27.2 27.5  MCHC 31.3 30.3 30.9  RDW 14.5 14.5 14.7  PLT 220 269 303    Cardiac Enzymes Recent Labs  Lab 08/03/18 0834 08/03/18 1359  TROPONINI 0.14* 0.15*    Recent Labs  Lab 08/03/18 0325 08/03/18 0643  TROPIPOC 0.21* 0.26*     BNP Recent Labs  Lab 08/03/18 0312  BNP 3,686.6*     DDimer No results for input(s): DDIMER in the last 168 hours.   Radiology  No results found.  Cardiac Studies   Echo   12.2.19    ------------------------------------------------------------------- Study Conclusions  - Left ventricle: The cavity size was severely dilated. Wall   thickness was normal. Systolic function was severely reduced. The   estimated ejection fraction was in the range of 15% to 20%.   Severe hypokinesis of the entireanterolateral, inferolateral, and   apical myocardium; consistent with ischemia or infarction in the   distribution of multiple vessels. There was a reduced   contribution of atrial contraction to ventricular filling, due to   increased ventricular diastolic pressure or atrial contractile   dysfunction. Features are consistent with a pseudonormal left   ventricular filling pattern, with concomitant abnormal relaxation   and increased filling pressure (grade 2 diastolic dysfunction).   There was spontaneous echo contrast, indicative of stasis. Cannot   exclude apical thrombus. No evidence of thrombus. - Ventricular septum: Septal motion showed abnormal  function,   dyssynergy, and paradox. - Aortic valve: There was trivial regurgitation. - Mitral valve: There was mild regurgitation. - Left atrium: The atrium was severely dilated. - Right ventricle: The cavity size was moderately dilated. Systolic   function was moderately reduced. - Right atrium: The atrium was moderately dilated. - Pulmonary arteries: Systolic pressure was moderately increased.   PA peak pressure: 53 mm Hg (S).  ------------------------------------------------------------------- Labs, prior tests, procedures, and surgery: Currently implanted device: implantable cardioverter defibrillator.  ------------------------------------------------------------------- Study data:  Comparison was made to the study of 12/30/2017.  Study status:  Routine.  Procedure:  The patient reported no pain pre or post test. Transthoracic echocardiography. Image quality was adequate. Intravenous contrast (Definity) was administered.  Study completion:  There were no complications.          Transthoracic echocardiography.  M-mode, complete 2D, spectral Doppler, and color Doppler.  Birthdate:  Patient birthdate: July 21, 1966.  Age:  Patient is 52 yr old.  Sex:  Gender: male.    BMI: 28.3 kg/m^2.  Blood pressure:     130/90  Patient status:  Inpatient.  Study date: Study date: 08/04/2018. Study time: 10:39 AM.  Location:  Echo laboratory.  -------------------------------------------------------------------  ------------------------------------------------------------------- Left ventricle:  The cavity size was severely dilated. Wall thickness was normal. Systolic function was severely reduced. The estimated ejection fraction was in the range of 15% to 20%. There was spontaneous echo contrast, indicative of stasis.  Cannot exclude apical thrombus.  No evidence of thrombus.  Regional wall motion abnormalities:   Severe hypokinesis of the entireanterolateral, inferolateral, and apical  myocardium; consistent with ischemia or infarction in the distribution of multiple vessels. There was a reduced contribution of atrial contraction to ventricular filling, due to increased ventricular diastolic pressure or atrial contractile dysfunction. Features are consistent with a pseudonormal left ventricular filling pattern, with concomitant abnormal relaxation and increased filling pressure (grade 2 diastolic dysfunction).  ------------------------------------------------------------------- Aortic valve:   Structurally normal valve. Trileaflet. Cusp separation was normal.  Doppler:  Transvalvular velocity was within the normal range. There was no stenosis. There was trivial regurgitation.  ------------------------------------------------------------------- Aorta:  Aortic root: The aortic root was normal in size. Ascending aorta: The ascending aorta was normal in size.  ------------------------------------------------------------------- Mitral valve:   Structurally normal valve.   Leaflet separation was normal.  Doppler:  Transvalvular velocity was within the normal range. There was no evidence for stenosis. There was mild regurgitation.    Valve area by pressure half-time: 3.86 cm^2. Indexed valve area by pressure half-time: 1.82 cm^2/m^2. Indexed valve area by continuity  equation (using LVOT flow): 0.97 cm^2/m^2.    Mean gradient (D): 2 mm Hg. Peak gradient (D): 10 mm Hg.  ------------------------------------------------------------------- Left atrium:  The atrium was severely dilated.  ------------------------------------------------------------------- Right ventricle:  The cavity size was moderately dilated. Pacer wire or catheter noted in right ventricle. Systolic function was moderately reduced.  ------------------------------------------------------------------- Ventricular septum:   Septal motion showed abnormal function, dyssynergy, and  paradox.  ------------------------------------------------------------------- Pulmonic valve:    Structurally normal valve.   Cusp separation was normal.  Doppler:  Transvalvular velocity was within the normal range. There was no regurgitation.  ------------------------------------------------------------------- Tricuspid valve:   Structurally normal valve.   Leaflet separation was normal.  Doppler:  Transvalvular velocity was within the normal range. There was mild regurgitation.  ------------------------------------------------------------------- Pulmonary artery:   Systolic pressure was moderately increased.  ------------------------------------------------------------------- Right atrium:  The atrium was moderately dilated.  ------------------------------------------------------------------- Pericardium:  There was no pericardial effusion.  ------------------------------------------------------------------- Systemic veins: Inferior vena cava: The vessel was dilated. The respirophasic diameter changes were blunted (< 50%), consistent with elevated central venous pressure.  ------------------------------------------------------------------- Measurements   Left ventricle                          Value           Reference  LV ID, ED, PLAX chordal         (H)     68.3   mm       43 - 52  LV ID, ES, PLAX chordal         (H)     55     mm       23 - 38  LV fx shortening, PLAX chordal  (L)     19     %        >=29  LV PW thickness, ED                     10.3   mm       ----------  IVS/LV PW ratio, ED                     0.9             <=1.3  Stroke volume, 2D                       82     ml       ----------  Stroke volume/bsa, 2D                   39     ml/m^2   ----------  LV e&', lateral                          3.97   cm/s     ----------  LV E/e&', lateral                        39.55           ----------  LV e&', medial                           2.84   cm/s      ----------  LV E/e&', medial  55.28           ----------  LV e&', average                          3.41   cm/s     ----------  LV E/e&', average                        46.11           ----------    Ventricular septum                      Value           Reference  IVS thickness, ED                       9.29   mm       ----------    LVOT                                    Value           Reference  LVOT ID, S                              22     mm       ----------  LVOT area                               3.8    cm^2     ----------  LVOT peak velocity, S                   121    cm/s     ----------  LVOT mean velocity, S                   73     cm/s     ----------  LVOT VTI, S                             21.7   cm       ----------  LVOT peak gradient, S                   6      mm Hg    ----------    Aortic valve                            Value           Reference  Aortic regurg pressure                  746    ms       ----------  half-time    Aorta                                   Value           Reference  Aortic root ID, ED  34     mm       ----------    Left atrium                             Value           Reference  LA ID, A-P, ES                          60     mm       ----------  LA ID/bsa, A-P                  (H)     2.83   cm/m^2   <=2.2  LA volume, S                            159    ml       ----------  LA volume/bsa, S                        75     ml/m^2   ----------  LA volume, ES, 1-p A4C                  168    ml       ----------  LA volume/bsa, ES, 1-p A4C              79.2   ml/m^2   ----------  LA volume, ES, 1-p A2C                  143    ml       ----------  LA volume/bsa, ES, 1-p A2C              67.4   ml/m^2   ----------    Mitral valve                            Value           Reference  Mitral E-wave peak velocity             157    cm/s     ----------  Mitral A-wave peak velocity             46.8   cm/s      ----------  Mitral mean velocity, D                 62.4   cm/s     ----------  Mitral deceleration slope               798.52 cm/s^2   ----------  Mitral deceleration time                197    ms       150 - 230  Mitral pressure half-time               57     ms       ----------  Mitral mean gradient, D                 2      mm Hg    ----------  Mitral peak gradient, D  10     mm Hg    ----------  Mitral E/A ratio, peak                  3.35            ----------  Mitral valve area, PHT, DP              3.86   cm^2     ----------  Mitral valve area/bsa, PHT, DP          1.82   cm^2/m^2 ----------  Mitral valve area/bsa, LVOT             0.97   cm^2/m^2 ----------  continuity  Mitral annulus VTI, D                   40.1   cm       ----------    Pulmonary arteries                      Value           Reference  PA pressure, S, DP              (H)     53     mm Hg    <=30    Tricuspid valve                         Value           Reference  Tricuspid regurg peak velocity          307    cm/s     ----------  Tricuspid peak RV-RA gradient           38     mm Hg    ----------    Right atrium                            Value           Reference  RA ID, S-I, ES, A4C             (H)     76.9   mm       34 - 49  RA area, ES, A4C                (H)     35.6   cm^2     8.3 - 19.5  RA volume, ES, A/L                      136    ml       ----------  RA volume/bsa, ES, A/L                  64.1   ml/m^2   ----------    Systemic veins                          Value           Reference  Estimated CVP                           15     mm Hg    ----------    Right ventricle  Value           Reference  RV ID, minor axis, ED, A4C      (H)     51.38  mm       26 - 43  TAPSE                                   14.6   mm       ----------  RV pressure, S, DP              (H)     53     mm Hg    <=30  RV s&', lateral, S                       7.7    cm/s      ----------  Legend: (L)  and  (H)  mark values outside specified reference range.  ------------------------------------------------------------------- Prepared and Electronically Authenticated by  Thurmon Fair, MD 2019-12-02T13:27:14 --------------------------------------------------------------------------------------   R heart cath 08/05/18    Most Recent Value  Fick Cardiac Output 4.28 L/min  Fick Cardiac Output Index 2.08 (L/min)/BSA  RA A Wave 16 mmHg  RA V Wave 14 mmHg  RA Mean 12 mmHg  RV Systolic Pressure 75 mmHg  RV Diastolic Pressure 13 mmHg  RV EDP 16 mmHg  PA Systolic Pressure 72 mmHg  PA Diastolic Pressure 37 mmHg  PA Mean 48 mmHg  PW A Wave 29 mmHg  PW V Wave 32 mmHg  PW Mean 29 mmHg  QP/QS 1  TPVR Index 23.11 HRUI     Patient Profile      52 y.o. male with a history of CAD, mixed ICM/NICM, HFrEF, NSVT s/p ICD, CKD IV, HTN, and HL, who is being seen today for the evaluation of chest pain and CHF at the request of Dr. Jomarie Longs.  Assessment & Plan    1   Acute on chronic systolic CHF.  Hx NICM   LVEF 15%  R heart cath  Has diuresed 4.3 L   Wt down   PCWP yesterday as noted above 29   He did not put out that much in 24 hours    Resume diuresis    I would recom contacting renal to assist    2  Atrial flutter   Transient episode in October   48 hours    Will need anticoagulatoin    2  CP   / CAD  / elevated troponin  Trivial bump in troponin prob from CHF  No CP    3   HTN  BP is up today   Continue diuresis Increase hydralazine     4   HL  COntinue statin rx   5  NSVT   On amiodarone 200  6  CKD   Stage IV Follows with nephrology in [place]  Would resume lasix 80 IV bid  Ask renal to see   For questions or updates, please contact [facility] Please consult [email or web address] for contact info under        Signed, Dietrich Pates, MD  08/06/2018, 8:12 AM

## 2018-08-06 NOTE — Progress Notes (Signed)
[facility]                                                                              Patient Demographics  [name], is a 52 y.o. male, DOB - December 06, 1965, [id number]  Admit date - 08/03/2018   Admitting Physician Lorretta Harp, MD  Outpatient Primary MD for the patient is Joette Catching, MD  Outpatient specialists:   LOS - 3  days   Medical records reviewed and are as summarized below:    Chief Complaint  Patient presents with  . 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 Patient reports compliance with medications and low-salt diet. Pt describes his chest pain as sharp at times and pressure-like on other occasions,midsternal,nonradiating,partly relieved by nitroglycerin given in the emergency room. In the ED, pt was found to have worsening kidney disease with creatinine of 3.8, troponin only mildly elevated at 0.2, elevated BNP and chest x-ray concerning for fluid overload. EKG with Q waves in inferior and anterior leads, prolonged QTC. Cardiology consulted. Pt admitted for further management.   Assessment & Plan    Acute on chronic systolic CHF with history of an ICM, EF 15% -Presented with BNP of 3686, chest x-ray with pulmonary edema -Patient was placed on IV Lasix for diuresis, negative balance of 4.3 L -Still somewhat short of breath, weight down from 197 on admission-> 193 -Cardiac cath  today with findings consistent with the severe pulmonary hypertension -Creatinine trended up to 3.6, has history of CKD stage IV, requested nephrology assistance -Will need home O2 evaluation prior to discharge.   History of CAD with chest pain, elevated troponin - Cardiology following, possibly demand ischemia from #1 - Last cath 2012 with occluded RCA with collaterals and nonobstructive disease in LAD and circumflex -Continue aspirin, Coreg, Lipitor, Imdur, hydralazine -2D echo 08/04/2018 showed EF of 15 to 20% with grade 2 diastolic dysfunction, pulmonary hypertension, PA pressure 53   Acute kidney injury on CKD stage IV -Baseline creatinine around 3, possibly cardiorenal syndrome, follows Dr. Kristian Covey in [place] -On diuresis, difficult given creatinine trending up, nephrology consulted  Anemia of chronic disease H&H stable  Essential hypertension Stable, continue Coreg, Imdur, hydralazine   Code Status: Full CODE STATUS DVT Prophylaxis: Heparin subcu Family Communication: Discussed in detail with the patient, all imaging results, lab results explained to the patient    Disposition Plan:   Time Spent in minutes     Procedures:  Cardiac cath 12/3  Consultants:   Cardiology Nephrology  Antimicrobials:      Medications  Scheduled Meds: . amiodarone  200 mg Oral Daily  . atorvastatin  80 mg  Oral Daily  . calcitRIOL  0.25 mcg Oral Q M,W,F  . carvedilol  25 mg Oral BID WC  . cholecalciferol  1,000 Units Oral Daily  . febuxostat  80 mg Oral Daily  . fluticasone  1 spray Each Nare Daily  . furosemide  80 mg Intravenous BID  . hydrALAZINE  50 mg Oral TID  . isosorbide mononitrate  60 mg Oral Daily  . mometasone-formoterol  2 puff Inhalation BID  . ranolazine  500 mg Oral BID  . sodium chloride flush  3 mL Intravenous Q12H   Continuous Infusions: . sodium chloride     PRN Meds:.sodium chloride, acetaminophen **OR** acetaminophen, alum & mag  hydroxide-simeth, morphine injection, nitroGLYCERIN, ondansetron **OR** ondansetron (ZOFRAN) IV, sodium chloride, sodium chloride flush   Antibiotics   Anti-infectives (From admission, onward)   None        Subjective:   [name] was seen and examined today.  Sitting up in the chair, still has shortness of breath.  No chest pain or dizziness.  Denies abdominal pain, N/V/D/C, new weakness, numbess, tingling. No acute events overnight.    Objective:   Vitals:   08/06/18 0431 08/06/18 0828 08/06/18 1041 08/06/18 1216  BP: (!) 159/103 (!) 159/104  120/71  Pulse: 68 63  (!) 57  Resp: 20   18  Temp: 97.7 F (36.5 C)   98 F (36.7 C)  TempSrc: Oral   Oral  SpO2: 93% 96% 96% 94%  Weight: 87.7 kg     Height:        Intake/Output Summary (Last 24 hours) at 08/06/2018 1259 Last data filed at 08/06/2018 2951 Gross per 24 hour  Intake 1020 ml  Output 800 ml  Net 220 ml     Wt Readings from Last 3 Encounters:  08/06/18 87.7 kg  05/15/18 92.1 kg  03/19/18 94.8 kg   Physical Exam  General: Alert and oriented x 3, NAD  Eyes:   HEENT:  jvd+  Cardiovascular: S1 S2 auscultated, RR  Respiratory: Bibasilar Rales  Gastrointestinal: Soft, nontender, nondistended, + bowel sounds  Ext: Trace pedal edema bilaterally  Neuro: No new deficits  Musculoskeletal: No digital cyanosis, clubbing  Skin: No rashes  Psych: Normal affect and demeanor, alert and oriented x3     Data Reviewed:  I have personally reviewed following labs and imaging studies  Micro Results Recent Results (from the past 240 hour(s))  Respiratory Panel by PCR     Status: Abnormal   Collection Time: 08/04/18  2:06 PM  Result Value Ref Range Status   Adenovirus NOT DETECTED NOT DETECTED Final   Coronavirus 229E NOT DETECTED NOT DETECTED Final   Coronavirus HKU1 NOT DETECTED NOT DETECTED Final   Coronavirus NL63 NOT DETECTED NOT DETECTED Final   Coronavirus OC43 NOT DETECTED NOT DETECTED Final     Metapneumovirus NOT DETECTED NOT DETECTED Final   Rhinovirus / Enterovirus DETECTED (A) NOT DETECTED Final   Influenza A NOT DETECTED NOT DETECTED Final   Influenza B NOT DETECTED NOT DETECTED Final   Parainfluenza Virus 1 NOT DETECTED NOT DETECTED Final   Parainfluenza Virus 2 NOT DETECTED NOT DETECTED Final   Parainfluenza Virus 3 NOT DETECTED NOT DETECTED Final   Parainfluenza Virus 4 NOT DETECTED NOT DETECTED Final   Respiratory Syncytial Virus NOT DETECTED NOT DETECTED Final   Bordetella pertussis NOT DETECTED NOT DETECTED Final   Chlamydophila pneumoniae NOT DETECTED NOT DETECTED Final   Mycoplasma pneumoniae NOT DETECTED NOT DETECTED Final  Comment: Performed at [facility], [address], [place], [place] [address]    Radiology Reports Dg Chest 2 View  Result Date: 08/03/2018 CLINICAL DATA:  Initial evaluation for acute chest pain. EXAM: CHEST - 2 VIEW COMPARISON:  Prior radiograph from 01/31/2018 FINDINGS: Left-sided pacemaker/AICD in place. Moderate to severe cardiomegaly, unchanged. Mediastinal silhouette normal. Lungs hypoinflated. Diffuse pulmonary vascular congestion with interstitial prominence, compatible with pulmonary interstitial edema. No appreciable pleural effusion. No consolidative airspace disease. No pneumothorax. No acute osseous abnormality. IMPRESSION: Cardiomegaly with mild to moderate diffuse pulmonary interstitial edema. Electronically Signed   By: Rise Mu M.D.   On: 08/03/2018 04:36    Lab Data:  CBC: Recent Labs  Lab 08/03/18 0312 08/04/18 0818 08/05/18 0505 08/06/18 0350  WBC 6.8 6.5 7.2 7.7  NEUTROABS 4.8 4.6 4.8 5.2  HGB 10.7* 10.7* 11.2* 12.2*  HCT 36.2* 34.2* 37.0* 39.5  MCV 92.8 91.0 89.8 89.2  PLT 204 220 269 303   Basic Metabolic Panel: Recent Labs  Lab 08/03/18 0312 08/04/18 0818 08/05/18 0505 08/06/18 0350  NA 138 140 138 140  K 3.8 4.0 3.6 3.8  CL 99 99 96* 97*  CO2 27 31 32 29  GLUCOSE 164* 111*  100* 118*  BUN 53* 49* 47* 50*  CREATININE 3.80* 3.54* 3.68* 3.62*  CALCIUM 8.3* 8.2* 8.5* 9.0   GFR: Estimated Creatinine Clearance: 26.9 mL/min (A) (by C-G formula based on SCr of 3.62 mg/dL (H)). Liver Function Tests: No results for input(s): AST, ALT, ALKPHOS, BILITOT, PROT, ALBUMIN in the last 168 hours. No results for input(s): LIPASE, AMYLASE in the last 168 hours. No results for input(s): AMMONIA in the last 168 hours. Coagulation Profile: Recent Labs  Lab 08/03/18 0312  INR 1.50   Cardiac Enzymes: Recent Labs  Lab 08/03/18 0834 08/03/18 1359  TROPONINI 0.14* 0.15*   BNP (last 3 results) No results for input(s): PROBNP in the last 8760 hours. HbA1C: No results for input(s): HGBA1C in the last 72 hours. CBG: No results for input(s): GLUCAP in the last 168 hours. Lipid Profile: No results for input(s): CHOL, HDL, LDLCALC, TRIG, CHOLHDL, LDLDIRECT in the last 72 hours. Thyroid Function Tests: No results for input(s): TSH, T4TOTAL, FREET4, T3FREE, THYROIDAB in the last 72 hours. Anemia Panel: No results for input(s): VITAMINB12, FOLATE, FERRITIN, TIBC, IRON, RETICCTPCT in the last 72 hours. Urine analysis:    Component Value Date/Time   COLORURINE AMBER (A) 05/10/2013 0404   APPEARANCEUR CLOUDY (A) 05/10/2013 0404   LABSPEC 1.012 05/10/2013 0404   PHURINE 5.5 05/10/2013 0404   GLUCOSEU NEGATIVE 05/10/2013 0404   HGBUR TRACE (A) 05/10/2013 0404   BILIRUBINUR NEGATIVE 05/10/2013 0404   KETONESUR NEGATIVE 05/10/2013 0404   PROTEINUR 100 (A) 05/10/2013 0404   UROBILINOGEN 1.0 05/10/2013 0404   NITRITE NEGATIVE 05/10/2013 0404   LEUKOCYTESUR TRACE (A) 05/10/2013 0404     Seaborn Nakama M.D. [facility] 08/06/2018, 12:59 PM  Pager: [phone] Between 7am to 7pm - call Pager - [phone]  After 7pm go to [email or web address] - password [email or web address]  Call night coverage person covering after 7pm

## 2018-08-07 LAB — BASIC METABOLIC PANEL
Anion gap: 13 (ref 5–15)
BUN: 46 mg/dL — ABNORMAL HIGH (ref 6–20)
CO2: 29 mmol/L (ref 22–32)
CREATININE: 3.46 mg/dL — AB (ref 0.61–1.24)
Calcium: 9 mg/dL (ref 8.9–10.3)
Chloride: 97 mmol/L — ABNORMAL LOW (ref 98–111)
GFR calc Af Amer: 22 mL/min — ABNORMAL LOW (ref >60)
GFR calc non Af Amer: 19 mL/min — ABNORMAL LOW (ref >60)
Glucose, Bld: 121 mg/dL — ABNORMAL HIGH (ref 70–99)
Potassium: 3.5 mmol/L (ref 3.5–5.1)
Sodium: 139 mmol/L (ref 135–145)

## 2018-08-07 LAB — CBC WITH DIFFERENTIAL/PLATELET
Abs Immature Granulocytes: 0.03 10*3/uL (ref 0.00–0.07)
BASOS ABS: 0 10*3/uL (ref 0.0–0.1)
Basophils Relative: 1 %
EOS PCT: 3 %
Eosinophils Absolute: 0.2 10*3/uL (ref 0.0–0.5)
HCT: 41.4 % (ref 39.0–52.0)
Hemoglobin: 12.7 g/dL — ABNORMAL LOW (ref 13.0–17.0)
Immature Granulocytes: 0 %
Lymphocytes Relative: 17 %
Lymphs Abs: 1.3 10*3/uL (ref 0.7–4.0)
MCH: 27.5 pg (ref 26.0–34.0)
MCHC: 30.7 g/dL (ref 30.0–36.0)
MCV: 89.6 fL (ref 80.0–100.0)
Monocytes Absolute: 0.5 10*3/uL (ref 0.1–1.0)
Monocytes Relative: 7 %
Neutro Abs: 5.4 10*3/uL (ref 1.7–7.7)
Neutrophils Relative %: 72 %
Platelets: 312 10*3/uL (ref 150–400)
RBC: 4.62 MIL/uL (ref 4.22–5.81)
RDW: 14.8 % (ref 11.5–15.5)
WBC: 7.5 10*3/uL (ref 4.0–10.5)
nRBC: 0 % (ref 0.0–0.2)

## 2018-08-07 MED ORDER — CARVEDILOL 25 MG PO TABS
25.0000 mg | ORAL_TABLET | Freq: Two times a day (BID) | ORAL | Status: DC
  Administered 2018-08-08 – 2018-08-09 (×3): 25 mg via ORAL
  Filled 2018-08-07 (×3): qty 1

## 2018-08-07 MED ORDER — HYDRALAZINE HCL 50 MG PO TABS
75.0000 mg | ORAL_TABLET | Freq: Three times a day (TID) | ORAL | Status: DC
  Administered 2018-08-07 – 2018-08-09 (×6): 75 mg via ORAL
  Filled 2018-08-07 (×6): qty 1

## 2018-08-07 MED ORDER — ISOSORBIDE MONONITRATE ER 60 MG PO TB24
90.0000 mg | ORAL_TABLET | Freq: Every day | ORAL | Status: DC
  Administered 2018-08-08 – 2018-08-09 (×2): 90 mg via ORAL
  Filled 2018-08-07 (×2): qty 1

## 2018-08-07 NOTE — Plan of Care (Signed)
  Problem: Education: Goal: Knowledge of General Education information will improve Description: Including pain rating scale, medication(s)/side effects and non-pharmacologic comfort measures Outcome: Progressing   Problem: Health Behavior/Discharge Planning: Goal: Ability to manage health-related needs will improve Outcome: Progressing   Problem: Clinical Measurements: Goal: Ability to maintain clinical measurements within normal limits will improve Outcome: Progressing Goal: Will remain free from infection Outcome: Progressing Goal: Diagnostic test results will improve Outcome: Progressing Goal: Respiratory complications will improve Outcome: Progressing Goal: Cardiovascular complication will be avoided Outcome: Progressing   Problem: Activity: Goal: Risk for activity intolerance will decrease Outcome: Progressing   Problem: Nutrition: Goal: Adequate nutrition will be maintained Outcome: Progressing   Problem: Coping: Goal: Level of anxiety will decrease Outcome: Progressing   Problem: Elimination: Goal: Will not experience complications related to bowel motility Outcome: Progressing Goal: Will not experience complications related to urinary retention Outcome: Progressing   Problem: Pain Managment: Goal: General experience of comfort will improve Outcome: Progressing   Problem: Safety: Goal: Ability to remain free from injury will improve Outcome: Progressing   Problem: Skin Integrity: Goal: Risk for impaired skin integrity will decrease Outcome: Progressing   Problem: Education: Goal: Understanding of cardiac disease, CV risk reduction, and recovery process will improve Outcome: Progressing Goal: Individualized Educational Video(s) Outcome: Progressing   Problem: Activity: Goal: Ability to tolerate increased activity will improve Outcome: Progressing   Problem: Cardiac: Goal: Ability to achieve and maintain adequate cardiovascular perfusion will  improve Outcome: Progressing   Problem: Health Behavior/Discharge Planning: Goal: Ability to safely manage health-related needs after discharge will improve Outcome: Progressing   Problem: Education: Goal: Ability to demonstrate management of disease process will improve Outcome: Progressing Goal: Ability to verbalize understanding of medication therapies will improve Outcome: Progressing Goal: Individualized Educational Video(s) Outcome: Progressing   Problem: Activity: Goal: Capacity to carry out activities will improve Outcome: Progressing   Problem: Cardiac: Goal: Ability to achieve and maintain adequate cardiopulmonary perfusion will improve Outcome: Progressing   

## 2018-08-07 NOTE — Progress Notes (Signed)
[facility]                                                                              Patient Demographics  [name], is a 52 y.o. male, DOB - April 21, 1966, [id number]  Admit date - 08/03/2018   Admitting Physician Lorretta Harp, MD  Outpatient Primary MD for the patient is Joette Catching, MD  Outpatient specialists:   LOS - 4  days   Medical records reviewed and are as summarized below:    Chief Complaint  Patient presents with  . 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 Patient reports compliance with medications and low-salt diet. Pt describes his chest pain as sharp at times and pressure-like on other occasions,midsternal,nonradiating,partly relieved by nitroglycerin given in the emergency room. In the ED, pt was found to have worsening kidney disease with creatinine of 3.8, troponin only mildly elevated at 0.2, elevated BNP and chest x-ray concerning for fluid overload. EKG with Q waves in inferior and anterior leads, prolonged QTC. Cardiology consulted. Pt admitted for further management.   Assessment & Plan    Acute on chronic systolic CHF with history of an ICM, EF 15% -Presented with BNP of 3686, chest x-ray with pulmonary edema -Patient was placed on IV Lasix for diuresis, negative balance of 7 L after increasing IV Lasix yesterday -Weight down from 197 at the time of admission to 189  today  -Cardiac cath consistent with the severe pulmonary hypertension -Will need home O2 evaluation prior to DC -Appreciate nephrology and cardiology recommendations, continue IV Lasix 80 mg twice daily   History of CAD with chest pain, elevated troponin - Cardiology following, possibly demand ischemia from #1 - Last cath 2012 with occluded RCA with collaterals and nonobstructive disease in LAD and circumflex -Continue aspirin, Coreg, Lipitor, Imdur, hydralazine -2D echo 08/04/2018 showed EF of 15 to 20% with grade 2 diastolic dysfunction, pulmonary hypertension, PA pressure 53   Acute kidney injury on CKD stage IV -Baseline creatinine around 3, possibly cardiorenal syndrome, follows Dr. Kristian Covey in [place] -Appreciate nephrology recommendations, Lasix increased to 80 mg IV twice daily  Anemia of chronic disease H&H stable  Essential hypertension Stable, continue Coreg, Imdur, hydralazine   Code Status: Full CODE STATUS DVT Prophylaxis: Heparin subcu Family Communication: Discussed in detail with the patient, all imaging results, lab results explained to the patient    Disposition Plan: Currently on IV Lasix, will stay inpatient  Time Spent in minutes     Procedures:  Cardiac cath 12/3  Consultants:   Cardiology Nephrology  Antimicrobials:      Medications  Scheduled Meds: . amiodarone  200  mg Oral Daily  . atorvastatin  80 mg Oral Daily  . calcitRIOL  0.25 mcg Oral Q M,W,F  . carvedilol  25 mg Oral BID WC  . cholecalciferol  1,000 Units Oral Daily  . febuxostat  80 mg Oral Daily  . fluticasone  1 spray Each Nare Daily  . furosemide  80 mg Intravenous BID  . hydrALAZINE  75 mg Oral TID  . [START ON 08/08/2018] isosorbide mononitrate  90 mg Oral Daily  . mometasone-formoterol  2 puff Inhalation BID  . ranolazine  500 mg Oral BID  . rivaroxaban  15 mg Oral Q supper  . sodium chloride flush  3 mL Intravenous Q12H   Continuous Infusions: . sodium  chloride     PRN Meds:.sodium chloride, acetaminophen **OR** acetaminophen, alum & mag hydroxide-simeth, morphine injection, nitroGLYCERIN, ondansetron **OR** ondansetron (ZOFRAN) IV, sodium chloride, sodium chloride flush   Antibiotics   Anti-infectives (From admission, onward)   None        Subjective:   [name] was seen and examined today.  Slightly better today still has shortness of breath on ambulation, no chest pain.  No dizziness. Denies abdominal pain, N/V/D/C, new weakness, numbess, tingling. No acute events overnight.    Objective:   Vitals:   08/07/18 0546 08/07/18 0756 08/07/18 0829 08/07/18 0914  BP: (!) 160/105   (!) 148/93  Pulse: 64   (!) 59  Resp:    16  Temp:    98 F (36.7 C)  TempSrc:    Oral  SpO2:   98% 96%  Weight:  86.1 kg    Height:        Intake/Output Summary (Last 24 hours) at 08/07/2018 1144 Last data filed at 08/07/2018 1000 Gross per 24 hour  Intake 960 ml  Output 3700 ml  Net -2740 ml     Wt Readings from Last 3 Encounters:  08/07/18 86.1 kg  05/15/18 92.1 kg  03/19/18 94.8 kg   Physical Exam  General: Alert and oriented x 3, NAD  Eyes:   HEENT:  JVD +  Cardiovascular: S1 S2 auscultated, RRR  Respiratory: Decreased breath sound at the bases  Gastrointestinal: Soft, nontender, nondistended, + bowel sounds  Ext: no pedal edema bilaterally  Neuro: No new deficit  Musculoskeletal: No digital cyanosis, clubbing  Skin: No rashes  Psych: Normal affect and demeanor, alert and oriented x3     Data Reviewed:  I have personally reviewed following labs and imaging studies  Micro Results Recent Results (from the past 240 hour(s))  Respiratory Panel by PCR     Status: Abnormal   Collection Time: 08/04/18  2:06 PM  Result Value Ref Range Status   Adenovirus NOT DETECTED NOT DETECTED Final   Coronavirus 229E NOT DETECTED NOT DETECTED Final   Coronavirus HKU1 NOT DETECTED NOT DETECTED Final   Coronavirus NL63 NOT  DETECTED NOT DETECTED Final   Coronavirus OC43 NOT DETECTED NOT DETECTED Final   Metapneumovirus NOT DETECTED NOT DETECTED Final   Rhinovirus / Enterovirus DETECTED (A) NOT DETECTED Final   Influenza A NOT DETECTED NOT DETECTED Final   Influenza B NOT DETECTED NOT DETECTED Final   Parainfluenza Virus 1 NOT DETECTED NOT DETECTED Final   Parainfluenza Virus 2 NOT DETECTED NOT DETECTED Final   Parainfluenza Virus 3 NOT DETECTED NOT DETECTED Final   Parainfluenza Virus 4 NOT DETECTED NOT DETECTED Final   Respiratory Syncytial Virus NOT DETECTED NOT DETECTED Final   Bordetella pertussis NOT DETECTED NOT DETECTED  Final   Chlamydophila pneumoniae NOT DETECTED NOT DETECTED Final   Mycoplasma pneumoniae NOT DETECTED NOT DETECTED Final    Comment: Performed at [facility], [address] [place], [place] [address]    Radiology Reports Dg Chest 2 View  Result Date: 08/03/2018 CLINICAL DATA:  Initial evaluation for acute chest pain. EXAM: CHEST - 2 VIEW COMPARISON:  Prior radiograph from 01/31/2018 FINDINGS: Left-sided pacemaker/AICD in place. Moderate to severe cardiomegaly, unchanged. Mediastinal silhouette normal. Lungs hypoinflated. Diffuse pulmonary vascular congestion with interstitial prominence, compatible with pulmonary interstitial edema. No appreciable pleural effusion. No consolidative airspace disease. No pneumothorax. No acute osseous abnormality. IMPRESSION: Cardiomegaly with mild to moderate diffuse pulmonary interstitial edema. Electronically Signed   By: Rise Mu M.D.   On: 08/03/2018 04:36    Lab Data:  CBC: Recent Labs  Lab 08/03/18 0312 08/04/18 0818 08/05/18 0505 08/06/18 0350 08/07/18 0430  WBC 6.8 6.5 7.2 7.7 7.5  NEUTROABS 4.8 4.6 4.8 5.2 5.4  HGB 10.7* 10.7* 11.2* 12.2* 12.7*  HCT 36.2* 34.2* 37.0* 39.5 41.4  MCV 92.8 91.0 89.8 89.2 89.6  PLT 204 220 269 303 312   Basic Metabolic Panel: Recent Labs  Lab 08/03/18 0312 08/04/18 0818  08/05/18 0505 08/06/18 0350 08/07/18 0430  NA 138 140 138 140 139  K 3.8 4.0 3.6 3.8 3.5  CL 99 99 96* 97* 97*  CO2 27 31 32 29 29  GLUCOSE 164* 111* 100* 118* 121*  BUN 53* 49* 47* 50* 46*  CREATININE 3.80* 3.54* 3.68* 3.62* 3.46*  CALCIUM 8.3* 8.2* 8.5* 9.0 9.0   GFR: Estimated Creatinine Clearance: 26.1 mL/min (A) (by C-G formula based on SCr of 3.46 mg/dL (H)). Liver Function Tests: No results for input(s): AST, ALT, ALKPHOS, BILITOT, PROT, ALBUMIN in the last 168 hours. No results for input(s): LIPASE, AMYLASE in the last 168 hours. No results for input(s): AMMONIA in the last 168 hours. Coagulation Profile: Recent Labs  Lab 08/03/18 0312  INR 1.50   Cardiac Enzymes: Recent Labs  Lab 08/03/18 0834 08/03/18 1359  TROPONINI 0.14* 0.15*   BNP (last 3 results) No results for input(s): PROBNP in the last 8760 hours. HbA1C: No results for input(s): HGBA1C in the last 72 hours. CBG: No results for input(s): GLUCAP in the last 168 hours. Lipid Profile: No results for input(s): CHOL, HDL, LDLCALC, TRIG, CHOLHDL, LDLDIRECT in the last 72 hours. Thyroid Function Tests: No results for input(s): TSH, T4TOTAL, FREET4, T3FREE, THYROIDAB in the last 72 hours. Anemia Panel: No results for input(s): VITAMINB12, FOLATE, FERRITIN, TIBC, IRON, RETICCTPCT in the last 72 hours. Urine analysis:    Component Value Date/Time   COLORURINE AMBER (A) 05/10/2013 0404   APPEARANCEUR CLOUDY (A) 05/10/2013 0404   LABSPEC 1.012 05/10/2013 0404   PHURINE 5.5 05/10/2013 0404   GLUCOSEU NEGATIVE 05/10/2013 0404   HGBUR TRACE (A) 05/10/2013 0404   BILIRUBINUR NEGATIVE 05/10/2013 0404   KETONESUR NEGATIVE 05/10/2013 0404   PROTEINUR 100 (A) 05/10/2013 0404   UROBILINOGEN 1.0 05/10/2013 0404   NITRITE NEGATIVE 05/10/2013 0404   LEUKOCYTESUR TRACE (A) 05/10/2013 0404     Ripudeep Rai M.D. [facility] 08/07/2018, 11:44 AM  Pager: [phone] Between 7am to 7pm - call Pager -  [phone]  After 7pm go to [email or web address] - password [email or web address]  Call night coverage person covering after 7pm

## 2018-08-07 NOTE — Progress Notes (Signed)
Progress Note  Patient Name: [name] Date of Encounter: 08/07/2018  Primary Cardiologist: Rollene Rotunda, MD   Subjective   Breathing is better   No CP    Inpatient Medications    Scheduled Meds: . amiodarone  200 mg Oral Daily  . atorvastatin  80 mg Oral Daily  . calcitRIOL  0.25 mcg Oral Q M,W,F  . carvedilol  25 mg Oral BID WC  . cholecalciferol  1,000 Units Oral Daily  . febuxostat  80 mg Oral Daily  . fluticasone  1 spray Each Nare Daily  . furosemide  80 mg Intravenous BID  . hydrALAZINE  50 mg Oral TID  . isosorbide mononitrate  60 mg Oral Daily  . mometasone-formoterol  2 puff Inhalation BID  . ranolazine  500 mg Oral BID  . rivaroxaban  15 mg Oral Q supper  . sodium chloride flush  3 mL Intravenous Q12H   Continuous Infusions: . sodium chloride     PRN Meds: sodium chloride, acetaminophen **OR** acetaminophen, alum & mag hydroxide-simeth, morphine injection, nitroGLYCERIN, ondansetron **OR** ondansetron (ZOFRAN) IV, sodium chloride, sodium chloride flush   Vital Signs    Vitals:   08/07/18 0525 08/07/18 0546 08/07/18 0756 08/07/18 0829  BP:  (!) 160/105    Pulse: 64 64    Resp:      Temp:      TempSrc:      SpO2:    98%  Weight:   86.1 kg   Height:        Intake/Output Summary (Last 24 hours) at 08/07/2018 0832 Last data filed at 08/07/2018 0530 Gross per 24 hour  Intake 1200 ml  Output 3700 ml  Net -2500 ml   Filed Weights   08/05/18 0558 08/06/18 0431 08/07/18 0756  Weight: 87.7 kg 87.7 kg 86.1 kg    Telemetry    SR - Personally Reviewed  ECG      Physical Exam   GEN: No acute distress.   Neck:  JVP is minimally elevated    Cardiac: RRR, no murmurs, rubs, or gallops.  Respiratory: CTA   GI: Soft, nontender, non-distended  MS: No edema; No deformity. Neuro:  Nonfocal  Psych: Normal affect   Labs    Chemistry Recent Labs  Lab 08/05/18 0505 08/06/18 0350 08/07/18 0430  NA 138 140 139  K 3.6 3.8 3.5  CL 96* 97*  97*  CO2 32 29 29  GLUCOSE 100* 118* 121*  BUN 47* 50* 46*  CREATININE 3.68* 3.62* 3.46*  CALCIUM 8.5* 9.0 9.0  GFRNONAA 18* 18* 19*  GFRAA 21* 21* 22*  ANIONGAP 10 14 13      Hematology Recent Labs  Lab 08/05/18 0505 08/06/18 0350 08/07/18 0430  WBC 7.2 7.7 7.5  RBC 4.12* 4.43 4.62  HGB 11.2* 12.2* 12.7*  HCT 37.0* 39.5 41.4  MCV 89.8 89.2 89.6  MCH 27.2 27.5 27.5  MCHC 30.3 30.9 30.7  RDW 14.5 14.7 14.8  PLT 269 303 312    Cardiac Enzymes Recent Labs  Lab 08/03/18 0834 08/03/18 1359  TROPONINI 0.14* 0.15*    Recent Labs  Lab 08/03/18 0325 08/03/18 0643  TROPIPOC 0.21* 0.26*     BNP Recent Labs  Lab 08/03/18 0312  BNP 3,686.6*     DDimer No results for input(s): DDIMER in the last 168 hours.   Radiology    No results found.  Cardiac Studies     Patient Profile      52 y.o. male  with a history of CAD, mixed ICM/NICM, HFrEF, NSVT s/p ICD, CKD IV, HTN, and HL, who is being seen today for the evaluation of chest pain and CHF at the request of Dr. Jomarie Longs.  Assessment & Plan    1   Acute on chronic systolic CHF.  Hx NICM   LVEF 15%  R heart cath PCWP was 29    Volume is improving   Renal now following and diuretics have been increased    2  Atrial flutter   Remote check of device reported  atrial flutter    This was last week   IN SR   More complete interrogation showed 48 hours of atrial flutter   Not enough I think to cause CHF exacerbation but needs anticoagulation    2  CP   / CAD  / elevated troponin  Trivial bump in troponin  prob from CHF exacerbation   No symptoms to sugg angina    3   HTN  BP is up today   Continue diuresis and follow   INcerase hydralazine and imdur   4   HL  COntinue statin rx   5  CKD  REnal service is following   Cr a little lower 3.46   Had not seen Dr Kristian Covey in over 1 yeaf    I encouraged him with recomm to f/u after d/c  For questions or updates, please contact [facility] Please consult [email or web address] for  contact info under        Signed, Dietrich Pates, MD  08/07/2018, 8:32 AM

## 2018-08-07 NOTE — Discharge Instructions (Signed)

## 2018-08-07 NOTE — Progress Notes (Signed)
  [facility] Progress Note    Assessment/ Plan:   1.Chronic kidney disease, stage IV: Followed by Dr. Kristian Covey but hasn't been seen in > 1 year per pt.  He is near his baseline creatinine.  I expect some fluctuation in creatinine with diuresis, especially in the setting of his likely underlying cardiorenal syndrome.  I agree with Lasix 80 IV BID as he has been having a good diuresis so far.  If it stagnates, could in fact increase the dose to 120 IV BID as long as creatinine not too far off baseline.  Doing well for now so no change recommended today. No ACEi/ARB for now.  2.  NICM with EF 15%- per cardiology.  Getting AC for possible apical thrombus  3.  PHTN: on RHC from 12/3.  Severely elevated PA pressures.  Continuing diuresis as above  4.  DM: per primary  5.  Dispo: pending further diuresis  Subjective:    Feeling OK.  IV diuresis going well.     Objective:   BP (!) 122/91 (BP Location: Left Arm)   Pulse 63   Temp 98.6 F (37 C) (Oral)   Resp 18   Ht 5\' 10"  (1.778 m)   Wt 86.1 kg   SpO2 100%   BMI 27.23 kg/m   Intake/Output Summary (Last 24 hours) at 08/07/2018 1319 Last data filed at 08/07/2018 1200 Gross per 24 hour  Intake 960 ml  Output 4400 ml  Net -3440 ml   Weight change:   Physical Exam: GEN NAD, lying flat in bed HEENT EOMI PERRL NECK no obvious JVD PULM faint bilateral crackles , R > L CV RRR, no m/r/g ABD soft, slightly distended EXT trace LE edema NEURO AAO x 3 nonfocal SKIN warm and dry, no rashes  Imaging: No results found.  Labs: BMET Recent Labs  Lab 08/03/18 0312 08/04/18 0818 08/05/18 0505 08/06/18 0350 08/07/18 0430  NA 138 140 138 140 139  K 3.8 4.0 3.6 3.8 3.5  CL 99 99 96* 97* 97*  CO2 27 31 32 29 29  GLUCOSE 164* 111* 100* 118* 121*  BUN 53* 49* 47* 50* 46*  CREATININE 3.80* 3.54* 3.68* 3.62* 3.46*  CALCIUM 8.3* 8.2* 8.5* 9.0 9.0   CBC Recent Labs  Lab 08/04/18 0818 08/05/18 0505 08/06/18 0350  08/07/18 0430  WBC 6.5 7.2 7.7 7.5  NEUTROABS 4.6 4.8 5.2 5.4  HGB 10.7* 11.2* 12.2* 12.7*  HCT 34.2* 37.0* 39.5 41.4  MCV 91.0 89.8 89.2 89.6  PLT 220 269 303 312    Medications:    . amiodarone  200 mg Oral Daily  . atorvastatin  80 mg Oral Daily  . calcitRIOL  0.25 mcg Oral Q M,W,F  . carvedilol  25 mg Oral BID WC  . cholecalciferol  1,000 Units Oral Daily  . febuxostat  80 mg Oral Daily  . fluticasone  1 spray Each Nare Daily  . furosemide  80 mg Intravenous BID  . hydrALAZINE  75 mg Oral TID  . [START ON 08/08/2018] isosorbide mononitrate  90 mg Oral Daily  . mometasone-formoterol  2 puff Inhalation BID  . ranolazine  500 mg Oral BID  . rivaroxaban  15 mg Oral Q supper  . sodium chloride flush  3 mL Intravenous Q12H      Bufford Buttner MD 08/07/2018, 1:19 PM

## 2018-08-08 DIAGNOSIS — I2510 Atherosclerotic heart disease of native coronary artery without angina pectoris: Secondary | ICD-10-CM

## 2018-08-08 LAB — BASIC METABOLIC PANEL
Anion gap: 13 (ref 5–15)
BUN: 48 mg/dL — ABNORMAL HIGH (ref 6–20)
CO2: 31 mmol/L (ref 22–32)
CREATININE: 3.83 mg/dL — AB (ref 0.61–1.24)
Calcium: 9.4 mg/dL (ref 8.9–10.3)
Chloride: 97 mmol/L — ABNORMAL LOW (ref 98–111)
GFR calc Af Amer: 20 mL/min — ABNORMAL LOW (ref >60)
GFR calc non Af Amer: 17 mL/min — ABNORMAL LOW (ref >60)
Glucose, Bld: 104 mg/dL — ABNORMAL HIGH (ref 70–99)
Potassium: 3.6 mmol/L (ref 3.5–5.1)
Sodium: 141 mmol/L (ref 135–145)

## 2018-08-08 LAB — CBC WITH DIFFERENTIAL/PLATELET
Abs Immature Granulocytes: 0.03 10*3/uL (ref 0.00–0.07)
Basophils Absolute: 0.1 10*3/uL (ref 0.0–0.1)
Basophils Relative: 1 %
Eosinophils Absolute: 0.2 10*3/uL (ref 0.0–0.5)
Eosinophils Relative: 3 %
HCT: 43.9 % (ref 39.0–52.0)
Hemoglobin: 13.7 g/dL (ref 13.0–17.0)
Immature Granulocytes: 0 %
LYMPHS ABS: 1.4 10*3/uL (ref 0.7–4.0)
LYMPHS PCT: 18 %
MCH: 27.8 pg (ref 26.0–34.0)
MCHC: 31.2 g/dL (ref 30.0–36.0)
MCV: 89 fL (ref 80.0–100.0)
Monocytes Absolute: 0.8 10*3/uL (ref 0.1–1.0)
Monocytes Relative: 10 %
Neutro Abs: 5.5 10*3/uL (ref 1.7–7.7)
Neutrophils Relative %: 68 %
Platelets: 329 10*3/uL (ref 150–400)
RBC: 4.93 MIL/uL (ref 4.22–5.81)
RDW: 15.1 % (ref 11.5–15.5)
WBC: 7.9 10*3/uL (ref 4.0–10.5)
nRBC: 0 % (ref 0.0–0.2)

## 2018-08-08 MED ORDER — TORSEMIDE 20 MG PO TABS
80.0000 mg | ORAL_TABLET | Freq: Every day | ORAL | Status: DC
  Filled 2018-08-08: qty 4

## 2018-08-08 NOTE — Progress Notes (Signed)
MD at bedside, aware of pt blood pressure fluctuating  MD stated to start PO diuretic tomorrow  Will continue to monitor

## 2018-08-08 NOTE — Progress Notes (Signed)
Progress Note  Patient Name: [name] Date of Encounter: 08/08/2018  Primary Cardiologist: Rollene Rotunda, MD   Subjective   Breathing comfortably flat in bed  No CP   Inpatient Medications    Scheduled Meds: . amiodarone  200 mg Oral Daily  . atorvastatin  80 mg Oral Daily  . calcitRIOL  0.25 mcg Oral Q M,W,F  . carvedilol  25 mg Oral BID WC  . cholecalciferol  1,000 Units Oral Daily  . febuxostat  80 mg Oral Daily  . fluticasone  1 spray Each Nare Daily  . furosemide  80 mg Intravenous BID  . hydrALAZINE  75 mg Oral TID  . isosorbide mononitrate  90 mg Oral Daily  . mometasone-formoterol  2 puff Inhalation BID  . ranolazine  500 mg Oral BID  . rivaroxaban  15 mg Oral Q supper  . sodium chloride flush  3 mL Intravenous Q12H   Continuous Infusions: . sodium chloride     PRN Meds: sodium chloride, acetaminophen **OR** acetaminophen, alum & mag hydroxide-simeth, morphine injection, nitroGLYCERIN, ondansetron **OR** ondansetron (ZOFRAN) IV, sodium chloride, sodium chloride flush   Vital Signs    Vitals:   08/07/18 1938 08/07/18 2151 08/08/18 0043 08/08/18 0507  BP: 132/89   (!) 156/95  Pulse: (!) 53   64  Resp: 20   18  Temp: (!) 97.4 F (36.3 C)   98.3 F (36.8 C)  TempSrc: Oral   Oral  SpO2: 99% 97%  93%  Weight:   85.2 kg   Height:        Intake/Output Summary (Last 24 hours) at 08/08/2018 0735 Last data filed at 08/08/2018 0600 Gross per 24 hour  Intake 600 ml  Output 3205 ml  Net -2605 ml   Filed Weights   08/06/18 0431 08/07/18 0756 08/08/18 0043  Weight: 87.7 kg 86.1 kg 85.2 kg    Telemetry     -SR   Personally Reviewed  ECG      Physical Exam   GEN: No acute distress.    Cardiac: RRR, no murmurs, rubs, or gallops.  Respiratory: CTA   GI: Soft, nontender, non-distended  MS: No edema; No deformity. Neuro:  Nonfocal  Psych: Normal affect   Labs    Chemistry Recent Labs  Lab 08/06/18 0350 08/07/18 0430 08/08/18 0501  NA  140 139 141  K 3.8 3.5 3.6  CL 97* 97* 97*  CO2 29 29 31   GLUCOSE 118* 121* 104*  BUN 50* 46* 48*  CREATININE 3.62* 3.46* 3.83*  CALCIUM 9.0 9.0 9.4  GFRNONAA 18* 19* 17*  GFRAA 21* 22* 20*  ANIONGAP 14 13 13      Hematology Recent Labs  Lab 08/06/18 0350 08/07/18 0430 08/08/18 0501  WBC 7.7 7.5 7.9  RBC 4.43 4.62 4.93  HGB 12.2* 12.7* 13.7  HCT 39.5 41.4 43.9  MCV 89.2 89.6 89.0  MCH 27.5 27.5 27.8  MCHC 30.9 30.7 31.2  RDW 14.7 14.8 15.1  PLT 303 312 329    Cardiac Enzymes Recent Labs  Lab 08/03/18 0834 08/03/18 1359  TROPONINI 0.14* 0.15*    Recent Labs  Lab 08/03/18 0325 08/03/18 0643  TROPIPOC 0.21* 0.26*     BNP Recent Labs  Lab 08/03/18 0312  BNP 3,686.6*     DDimer No results for input(s): DDIMER in the last 168 hours.   Radiology    No results found.  Cardiac Studies     Patient Profile      52  y.o. male with a history of CAD, mixed ICM/NICM, HFrEF, NSVT s/p ICD, CKD IV, HTN, and HL, who is being seen today for the evaluation of chest pain and CHF at the request of Dr. Jomarie Longs.  Assessment & Plan    1   Acute on chronic systolic CHF.  Hx NICM   LVEF 15%  R heart cath PCWP was 29     Pt has put out several more liters since 12/4  Appears comfortable  Renal following   Will be important to decide on oral dosing of diureitcs    Close to d/c   I will make sure he has outpt f/u with J Hochrein    2  Atrial flutter  Device check pt had 48 hours of atrial flutter       Not enough I think to cause CHF exacerbation but needs anticoagulation  Needs to be on anticoagulation   2  CP   / CAD  / elevated troponin    No symptoms to sugg angina    3   HTN BP is labile    4   HL  COntinue statin rx   5  CKD Cr 3.83   Renal following to adjust diuretics     For questions or updates, please contact [facility] Please consult [email or web address] for contact info under        Signed, Dietrich Pates, MD  08/08/2018, 7:35 AM

## 2018-08-08 NOTE — Progress Notes (Signed)
  [facility] Progress Note    Assessment/ Plan:   1.Chronic kidney disease, stage IV: Followed by Dr. Kristian Covey but hasn't been seen in > 1 year per pt.  He is near his baseline creatinine.  I expect some fluctuation in creatinine with diuresis, especially in the setting of his likely underlying cardiorenal syndrome.  Good diuresis on IV Lasix, will switch to oral torsemide tomorrow (80 daily) and monitor response- if weights stable and Cr relatively good then could d/c potentially over the weekend. No ACEi/ARB for now.  2.  NICM with EF 15%- per cardiology.  Getting AC for possible apical thrombus  3.  PHTN: on RHC from 12/3.  Severely elevated PA pressures.  Continuing diuresis as above  4.  DM: per primary  5.  Dispo: pending further diuresis  Subjective:    3.2L urine output, weights down.     Objective:   BP 105/68 (BP Location: Left Arm)   Pulse 62   Temp 98 F (36.7 C) (Oral)   Resp 20   Ht 5\' 10"  (1.778 m)   Wt 85.2 kg   SpO2 93%   BMI 26.95 kg/m   Intake/Output Summary (Last 24 hours) at 08/08/2018 1418 Last data filed at 08/08/2018 1211 Gross per 24 hour  Intake 600 ml  Output 2980 ml  Net -2380 ml   Weight change:   Physical Exam: GEN NAD, lying flat in bed HEENT EOMI PERRL NECK no obvious JVD PULM clear now CV RRR, no m/r/g ABD soft, no more distention EXT trace LE edema NEURO AAO x 3 nonfocal SKIN warm and dry, no rashes  Imaging: No results found.  Labs: BMET Recent Labs  Lab 08/03/18 0312 08/04/18 0818 08/05/18 0505 08/06/18 0350 08/07/18 0430 08/08/18 0501  NA 138 140 138 140 139 141  K 3.8 4.0 3.6 3.8 3.5 3.6  CL 99 99 96* 97* 97* 97*  CO2 27 31 32 29 29 31   GLUCOSE 164* 111* 100* 118* 121* 104*  BUN 53* 49* 47* 50* 46* 48*  CREATININE 3.80* 3.54* 3.68* 3.62* 3.46* 3.83*  CALCIUM 8.3* 8.2* 8.5* 9.0 9.0 9.4   CBC Recent Labs  Lab 08/05/18 0505 08/06/18 0350 08/07/18 0430 08/08/18 0501  WBC 7.2 7.7 7.5 7.9   NEUTROABS 4.8 5.2 5.4 5.5  HGB 11.2* 12.2* 12.7* 13.7  HCT 37.0* 39.5 41.4 43.9  MCV 89.8 89.2 89.6 89.0  PLT 269 303 312 329    Medications:    . amiodarone  200 mg Oral Daily  . atorvastatin  80 mg Oral Daily  . calcitRIOL  0.25 mcg Oral Q M,W,F  . carvedilol  25 mg Oral BID WC  . cholecalciferol  1,000 Units Oral Daily  . febuxostat  80 mg Oral Daily  . fluticasone  1 spray Each Nare Daily  . hydrALAZINE  75 mg Oral TID  . isosorbide mononitrate  90 mg Oral Daily  . mometasone-formoterol  2 puff Inhalation BID  . ranolazine  500 mg Oral BID  . rivaroxaban  15 mg Oral Q supper  . sodium chloride flush  3 mL Intravenous Q12H  . [START ON 08/09/2018] torsemide  80 mg Oral Daily      Bufford Buttner MD 08/08/2018, 2:18 PM

## 2018-08-08 NOTE — Progress Notes (Signed)
[facility]                                                                              Patient Demographics  [name], is a 52 y.o. male, DOB - [birth year], YBO:[id number]  Admit date - 08/03/2018   Admitting Physician Lorretta Harp, MD  Outpatient Primary MD for the patient is Joette Catching, MD  Outpatient specialists:   LOS - 5  days   Medical records reviewed and are as summarized below:    Chief Complaint  Patient presents with  . 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 Patient reports compliance with medications and low-salt diet. Pt describes his chest pain as sharp at times and pressure-like on other occasions,midsternal,nonradiating,partly relieved by nitroglycerin given in the emergency room. In the ED, pt was found to have worsening kidney disease with creatinine of 3.8, troponin only mildly elevated at 0.2, elevated BNP and chest x-ray concerning for fluid overload. EKG with Q waves in inferior and anterior leads, prolonged QTC. Cardiology consulted. Pt admitted for further management.   Assessment & Plan    Acute on chronic systolic CHF with history of an ICM, EF 15% -Presented with BNP of 3686, chest x-ray with pulmonary edema -Continue IV Lasix for diuresis, 10.6 L, Lasix was increased after nephrology recommendations to 80mg  12hrs IV.   -Weight down from 197 at the time of admission  to 187 today -Cardiac cath consistent with the severe pulmonary hypertension -Will need home O2 evaluation prior to DC BP soft today, Lasix placed on hold, had received 8 AM dose today   History of CAD with chest pain, elevated troponin - Cardiology following, possibly demand ischemia from #1 - Last cath 2012 with occluded RCA with collaterals and nonobstructive disease in LAD and circumflex -Continue aspirin, Coreg, Lipitor, Imdur, hydralazine -2D echo 08/04/2018 showed EF of 15 to 20% with grade 2 diastolic dysfunction, pulmonary hypertension, PA pressure 53   Acute kidney injury on CKD stage IV -Baseline creatinine around 3, possibly cardiorenal syndrome, follows Dr. Kristian Covey in [place] -Creatinine trending up today 3.8 with borderline BP, Lasix placed on hold, did receive 8 AM dose today.    Anemia of chronic disease H&H stable  Essential hypertension BP soft, IV Lasix on hold  Continue Coreg Imdur and hydralazine    Code Status: Full CODE STATUS DVT Prophylaxis: Heparin subcu Family Communication: Discussed in detail with the patient, all imaging results, lab results explained to the patient    Disposition Plan: Creatinine trended up today, borderline BP, change in management, will stay inpatient today  Time Spent in minutes   35 minutes  Procedures:  Cardiac cath 12/3  Consultants:   Cardiology Nephrology  Antimicrobials:      Medications  Scheduled Meds: . amiodarone  200 mg Oral Daily  . atorvastatin  80 mg Oral Daily  . calcitRIOL  0.25 mcg Oral Q M,W,F  . carvedilol  25 mg Oral BID WC  . cholecalciferol  1,000 Units Oral Daily  . febuxostat  80 mg Oral Daily  . fluticasone  1 spray Each Nare Daily  . hydrALAZINE  75 mg Oral TID  . isosorbide mononitrate  90 mg Oral Daily  . mometasone-formoterol  2 puff Inhalation BID  . ranolazine  500 mg Oral BID  . rivaroxaban  15 mg Oral Q supper  . sodium chloride flush  3 mL Intravenous Q12H  . [START  ON 08/09/2018] torsemide  80 mg Oral Daily   Continuous Infusions: . sodium chloride     PRN Meds:.sodium chloride, acetaminophen **OR** acetaminophen, alum & mag hydroxide-simeth, morphine injection, nitroGLYCERIN, ondansetron **OR** ondansetron (ZOFRAN) IV, sodium chloride, sodium chloride flush   Antibiotics   Anti-infectives (From admission, onward)   None        Subjective:   [name] was seen and examined today.  Getting tired of being in the hospital, hoping to be discharged soon.  States feels better with the shortness of breath since admission.  No chest pain.  No dizziness. Denies abdominal pain, N/V/D/C, new weakness, numbess, tingling. No acute events overnight.    Objective:   Vitals:   08/08/18 0507 08/08/18 0811 08/08/18 0818 08/08/18 0932  BP: (!) 156/95  (!) 158/103 105/68  Pulse: 64  70 62  Resp: 18   20  Temp: 98.3 F (36.8 C)   98 F (36.7 C)  TempSrc: Oral   Oral  SpO2: 93% 97%  93%  Weight:      Height:        Intake/Output Summary (Last 24 hours) at 08/08/2018 1242 Last data filed at 08/08/2018 1211 Gross per 24 hour  Intake 600 ml  Output 2980 ml  Net -2380 ml     Wt Readings from Last 3 Encounters:  08/08/18 85.2 kg  05/15/18 92.1 kg  03/19/18 94.8 kg    Physical Exam  General: Alert and oriented x 3, NAD  Eyes:   HEENT:    Cardiovascular: S1 S2 auscultated, RRR No pedal edema b/l  Respiratory: CTA B  Gastrointestinal: Soft, nontender, nondistended, + bowel sounds  Ext: no pedal edema bilaterally  Neuro:  Musculoskeletal: No digital cyanosis, clubbing  Skin: No rashes  Psych: Normal affect and demeanor, alert and oriented x3     Data Reviewed:  I have personally reviewed following labs and imaging studies  Micro Results Recent Results (from the past 240 hour(s))  Respiratory Panel by PCR     Status: Abnormal   Collection Time: 08/04/18  2:06 PM  Result Value Ref Range Status   Adenovirus NOT DETECTED NOT  DETECTED Final   Coronavirus 229E NOT DETECTED NOT DETECTED Final   Coronavirus HKU1 NOT DETECTED NOT DETECTED Final   Coronavirus NL63 NOT DETECTED NOT DETECTED Final   Coronavirus OC43 NOT DETECTED NOT DETECTED Final   Metapneumovirus NOT DETECTED NOT DETECTED Final   Rhinovirus / Enterovirus DETECTED (A) NOT DETECTED Final   Influenza A NOT DETECTED NOT DETECTED Final   Influenza B NOT DETECTED NOT DETECTED Final   Parainfluenza Virus 1 NOT DETECTED NOT DETECTED Final   Parainfluenza Virus 2 NOT DETECTED NOT DETECTED Final  Parainfluenza Virus 3 NOT DETECTED NOT DETECTED Final   Parainfluenza Virus 4 NOT DETECTED NOT DETECTED Final   Respiratory Syncytial Virus NOT DETECTED NOT DETECTED Final   Bordetella pertussis NOT DETECTED NOT DETECTED Final   Chlamydophila pneumoniae NOT DETECTED NOT DETECTED Final   Mycoplasma pneumoniae NOT DETECTED NOT DETECTED Final    Comment: Performed at [facility], [address] [place], [place] [address]    Radiology Reports Dg Chest 2 View  Result Date: 08/03/2018 CLINICAL DATA:  Initial evaluation for acute chest pain. EXAM: CHEST - 2 VIEW COMPARISON:  Prior radiograph from 01/31/2018 FINDINGS: Left-sided pacemaker/AICD in place. Moderate to severe cardiomegaly, unchanged. Mediastinal silhouette normal. Lungs hypoinflated. Diffuse pulmonary vascular congestion with interstitial prominence, compatible with pulmonary interstitial edema. No appreciable pleural effusion. No consolidative airspace disease. No pneumothorax. No acute osseous abnormality. IMPRESSION: Cardiomegaly with mild to moderate diffuse pulmonary interstitial edema. Electronically Signed   By: Rise Mu M.D.   On: 08/03/2018 04:36    Lab Data:  CBC: Recent Labs  Lab 08/04/18 0818 08/05/18 0505 08/06/18 0350 08/07/18 0430 08/08/18 0501  WBC 6.5 7.2 7.7 7.5 7.9  NEUTROABS 4.6 4.8 5.2 5.4 5.5  HGB 10.7* 11.2* 12.2* 12.7* 13.7  HCT 34.2* 37.0* 39.5 41.4 43.9    MCV 91.0 89.8 89.2 89.6 89.0  PLT 220 269 303 312 329   Basic Metabolic Panel: Recent Labs  Lab 08/04/18 0818 08/05/18 0505 08/06/18 0350 08/07/18 0430 08/08/18 0501  NA 140 138 140 139 141  K 4.0 3.6 3.8 3.5 3.6  CL 99 96* 97* 97* 97*  CO2 31 32 29 29 31   GLUCOSE 111* 100* 118* 121* 104*  BUN 49* 47* 50* 46* 48*  CREATININE 3.54* 3.68* 3.62* 3.46* 3.83*  CALCIUM 8.2* 8.5* 9.0 9.0 9.4   GFR: Estimated Creatinine Clearance: 23.6 mL/min (A) (by C-G formula based on SCr of 3.83 mg/dL (H)). Liver Function Tests: No results for input(s): AST, ALT, ALKPHOS, BILITOT, PROT, ALBUMIN in the last 168 hours. No results for input(s): LIPASE, AMYLASE in the last 168 hours. No results for input(s): AMMONIA in the last 168 hours. Coagulation Profile: Recent Labs  Lab 08/03/18 0312  INR 1.50   Cardiac Enzymes: Recent Labs  Lab 08/03/18 0834 08/03/18 1359  TROPONINI 0.14* 0.15*   BNP (last 3 results) No results for input(s): PROBNP in the last 8760 hours. HbA1C: No results for input(s): HGBA1C in the last 72 hours. CBG: No results for input(s): GLUCAP in the last 168 hours. Lipid Profile: No results for input(s): CHOL, HDL, LDLCALC, TRIG, CHOLHDL, LDLDIRECT in the last 72 hours. Thyroid Function Tests: No results for input(s): TSH, T4TOTAL, FREET4, T3FREE, THYROIDAB in the last 72 hours. Anemia Panel: No results for input(s): VITAMINB12, FOLATE, FERRITIN, TIBC, IRON, RETICCTPCT in the last 72 hours. Urine analysis:    Component Value Date/Time   COLORURINE AMBER (A) 05/10/2013 0404   APPEARANCEUR CLOUDY (A) 05/10/2013 0404   LABSPEC 1.012 05/10/2013 0404   PHURINE 5.5 05/10/2013 0404   GLUCOSEU NEGATIVE 05/10/2013 0404   HGBUR TRACE (A) 05/10/2013 0404   BILIRUBINUR NEGATIVE 05/10/2013 0404   KETONESUR NEGATIVE 05/10/2013 0404   PROTEINUR 100 (A) 05/10/2013 0404   UROBILINOGEN 1.0 05/10/2013 0404   NITRITE NEGATIVE 05/10/2013 0404   LEUKOCYTESUR TRACE (A) 05/10/2013  0404     Ramonica Grigg M.D. [facility] 08/08/2018, 12:42 PM  Pager: [phone] Between 7am to 7pm - call Pager - [phone]  After 7pm go to [email or web address] -  password [email or web address]  Call night coverage person covering after 7pm

## 2018-08-09 LAB — BASIC METABOLIC PANEL
Anion gap: 13 (ref 5–15)
BUN: 56 mg/dL — ABNORMAL HIGH (ref 6–20)
CO2: 28 mmol/L (ref 22–32)
Calcium: 9.3 mg/dL (ref 8.9–10.3)
Chloride: 97 mmol/L — ABNORMAL LOW (ref 98–111)
Creatinine, Ser: 4.35 mg/dL — ABNORMAL HIGH (ref 0.61–1.24)
GFR calc Af Amer: 17 mL/min — ABNORMAL LOW (ref >60)
GFR calc non Af Amer: 15 mL/min — ABNORMAL LOW (ref >60)
GLUCOSE: 145 mg/dL — AB (ref 70–99)
Potassium: 3.6 mmol/L (ref 3.5–5.1)
Sodium: 138 mmol/L (ref 135–145)

## 2018-08-09 LAB — CBC WITH DIFFERENTIAL/PLATELET
Abs Immature Granulocytes: 0.03 10*3/uL (ref 0.00–0.07)
Basophils Absolute: 0.1 10*3/uL (ref 0.0–0.1)
Basophils Relative: 1 %
Eosinophils Absolute: 0.2 10*3/uL (ref 0.0–0.5)
Eosinophils Relative: 2 %
HCT: 42.9 % (ref 39.0–52.0)
Hemoglobin: 13.6 g/dL (ref 13.0–17.0)
Immature Granulocytes: 0 %
Lymphocytes Relative: 19 %
Lymphs Abs: 1.6 10*3/uL (ref 0.7–4.0)
MCH: 27.9 pg (ref 26.0–34.0)
MCHC: 31.7 g/dL (ref 30.0–36.0)
MCV: 88.1 fL (ref 80.0–100.0)
Monocytes Absolute: 0.8 10*3/uL (ref 0.1–1.0)
Monocytes Relative: 9 %
Neutro Abs: 5.8 10*3/uL (ref 1.7–7.7)
Neutrophils Relative %: 69 %
Platelets: 304 10*3/uL (ref 150–400)
RBC: 4.87 MIL/uL (ref 4.22–5.81)
RDW: 14.8 % (ref 11.5–15.5)
WBC: 8.4 10*3/uL (ref 4.0–10.5)
nRBC: 0 % (ref 0.0–0.2)

## 2018-08-09 MED ORDER — RIVAROXABAN 15 MG PO TABS: 15.0000 mg | ORAL_TABLET | Freq: Every day | ORAL | 4 refills | Status: AC

## 2018-08-09 MED ORDER — TORSEMIDE 20 MG PO TABS: 40.0000 mg | ORAL_TABLET | Freq: Every day | ORAL | 3 refills | Status: AC

## 2018-08-09 MED ORDER — TORSEMIDE 20 MG PO TABS
40.0000 mg | ORAL_TABLET | Freq: Every day | ORAL | Status: DC
  Administered 2018-08-09: 40 mg via ORAL

## 2018-08-09 MED ORDER — ISOSORBIDE MONONITRATE ER 30 MG PO TB24: 90.0000 mg | ORAL_TABLET | Freq: Every day | ORAL | 3 refills | Status: AC

## 2018-08-09 MED ORDER — HYDRALAZINE HCL 25 MG PO TABS: 75.0000 mg | ORAL_TABLET | Freq: Three times a day (TID) | ORAL | 2 refills | Status: AC

## 2018-08-09 NOTE — Discharge Summary (Signed)
Physician Discharge Summary   Patient ID: [name] MRN: [id number] DOB/AGE: 51-Jun-1967 51 y.o.  Admit date: 08/03/2018 Discharge date: 08/09/2018  Primary Care Physician:  Joette Catching, MD   Recommendations for Outpatient Follow-up:  1. Follow up with PCP in 1-2 weeks 2. Strongly recommended patient to follow-up with PCP next week and obtain renal function 3. Also recommended to follow-up with his nephrologist, Dr. Kristian Covey  Home Health: None or ** Equipment/Devices:   Discharge Condition: stable  CODE STATUS: FULL  Diet recommendation: Heart healthy diet   Discharge Diagnoses:    Acute on chronic combined systolic and diastolic CHF, history of ICM, EF 15% . History of CAD with chest pain, elevated troponin from demand ischemia . Acute kidney injury on CKD (chronic kidney disease), stage 4 (HCC) . Anemia of chronic disease (HCC) . Coronary artery disease involving native coronary artery of native heart without angina pectoris . Essential hypertension . Severe pulmonary hypertension   Consults: Cardiology Nephrology    Allergies:  No Known Allergies   DISCHARGE MEDICATIONS: Allergies as of 08/09/2018   No Known Allergies     Medication List    STOP taking these medications   aspirin EC 81 MG tablet   metolazone 5 MG tablet Commonly known as:  ZAROXOLYN   potassium chloride SA 20 MEQ tablet Commonly known as:  K-DUR,KLOR-CON     TAKE these medications   amiodarone 200 MG tablet Commonly known as:  PACERONE Take 200 mg by mouth daily.   atorvastatin 80 MG tablet Commonly known as:  LIPITOR Take 80 mg by mouth daily.   budesonide-formoterol 160-4.5 MCG/ACT inhaler Commonly known as:  SYMBICORT Inhale 2 puffs into the lungs 2 (two) times daily.   calcitRIOL 0.25 MCG capsule Commonly known as:  ROCALTROL Take 0.25 mcg by mouth every Monday, Wednesday, and Friday.   carvedilol 25 MG tablet Commonly known as:  COREG Take 1 tablet (25 mg  total) by mouth 2 (two) times daily with a meal.   cholecalciferol 1000 units tablet Commonly known as:  VITAMIN D Take 1,000 Units by mouth daily.   hydrALAZINE 25 MG tablet Commonly known as:  APRESOLINE Take 3 tablets (75 mg total) by mouth 3 (three) times daily. What changed:    how much to take  when to take this   isosorbide mononitrate 30 MG 24 hr tablet Commonly known as:  IMDUR Take 3 tablets (90 mg total) by mouth daily. Start taking on:  08/10/2018 What changed:    medication strength  how much to take   nitroGLYCERIN 0.4 MG SL tablet Commonly known as:  NITROSTAT Place 1 tablet (0.4 mg total) under the tongue every 5 (five) minutes as needed for chest pain. KEEP OV.   ranolazine 500 MG 12 hr tablet Commonly known as:  RANEXA TAKE ONE TABLET BY MOUTH 2 TIMES A DAY **CALL OFFICE TO MAKE APPOINTMENT** What changed:    how much to take  how to take this  when to take this  additional instructions   Rivaroxaban 15 MG Tabs tablet Commonly known as:  XARELTO Take 1 tablet (15 mg total) by mouth daily with supper.   torsemide 20 MG tablet Commonly known as:  DEMADEX Take 2 tablets (40 mg total) by mouth daily. What changed:  how much to take   ULORIC 80 MG Tabs Generic drug:  Febuxostat Take 80 mg by mouth daily.        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 Patient reports compliance with medications and low-salt diet. Ptdescribes his chest pain as sharp at times and pressure-like on other occasions,midsternal,nonradiating,partly relieved by nitroglycerin given in the emergency room. In the ED, ptwas found to have worsening kidney disease with creatinine of 3.8, troponin only mildly elevated at 0.2, elevated BNP and chest x-ray concerning for fluid overload.EKG with Q waves in inferior and anterior leads, prolonged QTC. Cardiology consulted. Pt admitted for further management  Hospital Course:   Acute on chronic systolic and diastolic CHF with history of an ICM, EF 15% -Presented with BNP of 3686, chest x-ray with pulmonary edema -2D echo on 12/2 showed EF of 15 to 20% with diffuse hypokinesis, grade 2 diastolic dysfunction Cardiology was consulted, patient was placed on IV Lasix for diuresis, negative balance of 11 L at the time of discharge, weight down from 197 at the time of admission to 186 lbs at discharge -Patient underwent cardiac cath consistent with the severe pulmonary hypertension -Nephrology was consulted due to concern for cardiorenal syndrome, creatinine trending up, has been noncompliant with outpatient follow-up with his nephrologist.  He was placed on higher dose of Lasix with improvement in his symptoms however creatinine has been trending up, 4.3 at the time of discharge.  -Nephrology, Dr. Signe Colt recommended torsemide 40 mg daily and recheck labs early next week, this was explained to the patient.  States he has a follow-up appointment with his PCP on Tuesday.   -Strongly recommended to the patient to follow-up with his nephrologist Dr. Kristian Covey.   History of CAD with chest pain, elevated troponin, severe pulmonary hypertension - Cardiology following, possibly demand ischemia from #1 - Last cath 2012 with occluded RCA with collaterals and nonobstructive disease in LAD and circumflex -Continue aspirin, Coreg,  Lipitor, Imdur, hydralazine -2D echo 08/04/2018 showed EF of 15 to 20% with grade 2 diastolic dysfunction, pulmonary hypertension, PA pressure 53.  Patient underwent cardiac cath which showed severe pulmonary hypertension -O2 sats currently 97% on room air   Acute kidney injury on CKD stage IV -Baseline creatinine around 3, possibly cardiorenal syndrome, follows Dr. Kristian Covey in [place] -Creatinine trending up to 4.3 likely due to diuresis, adamant on going home today, changed to torsemide, recommended to follow-up labs with his PCP and Dr. Kristian Covey.  Anemia of chronic disease H&H stable  Essential hypertension Continue Coreg Imdur and hydralazine     Day of Discharge S: Ambulating in the room, getting dressed up, wants to be discharged   BP (!) 133/103   Pulse 63   Temp 98.3 F (36.8 C) (Oral)   Resp 18   Ht 5\' 10"  (1.778 m)   Wt 84.7 kg   SpO2 97%   BMI 26.80 kg/m   Physical Exam: General: Alert and awake oriented x3 not in any acute distress. HEENT: anicteric sclera, pupils reactive to light and accommodation CVS: S1-S2 clear no murmur rubs or gallops Chest: clear to auscultation bilaterally, no wheezing rales or rhonchi Abdomen: soft  nontender, nondistended, normal bowel sounds Extremities: no cyanosis, clubbing or edema noted bilaterally Neuro: Cranial nerves II-XII intact, no focal neurological deficits   The results of significant diagnostics from this hospitalization (including imaging, microbiology, ancillary and laboratory) are listed below for reference.      Procedures/Studies:  Dg Chest 2 View  Result Date: 08/03/2018 CLINICAL DATA:  Initial evaluation for acute chest pain. EXAM: CHEST - 2 VIEW COMPARISON:  Prior radiograph from 01/31/2018 FINDINGS: Left-sided pacemaker/AICD in place. Moderate to severe cardiomegaly, unchanged. Mediastinal silhouette normal. Lungs hypoinflated. Diffuse pulmonary vascular congestion with interstitial prominence,  compatible with pulmonary interstitial edema. No appreciable pleural effusion. No consolidative airspace disease. No pneumothorax. No acute osseous abnormality. IMPRESSION: Cardiomegaly with mild to moderate diffuse pulmonary interstitial edema. Electronically Signed   By: Rise Mu M.D.   On: 08/03/2018 04:36       LAB RESULTS: Basic Metabolic Panel: Recent Labs  Lab 08/08/18 0501 08/09/18 0702  NA 141 138  K 3.6 3.6  CL 97* 97*  CO2 31 28  GLUCOSE 104* 145*  BUN 48* 56*  CREATININE 3.83* 4.35*  CALCIUM 9.4 9.3   Liver Function Tests: No results for input(s): AST, ALT, ALKPHOS, BILITOT, PROT, ALBUMIN in the last 168 hours. No results for input(s): LIPASE, AMYLASE in the last 168 hours. No results for input(s): AMMONIA in the last 168 hours. CBC: Recent Labs  Lab 08/08/18 0501 08/09/18 0513  WBC 7.9 8.4  NEUTROABS 5.5 5.8  HGB 13.7 13.6  HCT 43.9 42.9  MCV 89.0 88.1  PLT 329 304   Cardiac Enzymes: Recent Labs  Lab 08/03/18 0834 08/03/18 1359  TROPONINI 0.14* 0.15*   BNP: Invalid input(s): POCBNP CBG: No results for input(s): GLUCAP in the last 168 hours.    Disposition and Follow-up: Discharge Instructions    (HEART FAILURE PATIENTS) Call MD:  Anytime you have any of the following symptoms: 1) 3 pound weight gain in 24 hours or 5 pounds in 1 week 2) shortness of breath, with or without a dry hacking cough 3) swelling in the hands, feet or stomach 4) if you have to sleep on extra pillows at night in order to breathe.   Complete by:  As directed    Diet - low sodium heart healthy   Complete by:  As directed    Increase activity slowly   Complete by:  As directed        DISPOSITION: Home   DISCHARGE FOLLOW-UP Follow-up Information    Joette Catching, MD. Schedule an appointment as soon as possible for a visit in 2 day(s).   Specialty:  Family Medicine Why:  need labs check for renal function  Contact information: [address] [place] [address] [phone]        Rollene Rotunda, MD. Schedule an appointment as soon as possible for a visit in 2 week(s).   Specialty:  Cardiology Contact information: [address] [place] [address] [phone]        Salomon Mast, MD. Schedule an appointment as soon as possible for a visit in 2 week(s).   Specialty:  Nephrology Contact information: [address] [place] [address] [phone]            Time coordinating discharge:  35 minutes  Signed:   Thad Ranger M.D. [facility] 08/09/2018, 11:59 AM Pager: [phone]

## 2018-08-09 NOTE — Progress Notes (Signed)
Call placed to [facility] to notify of telemetry monitoring d/c.   

## 2018-08-09 NOTE — Progress Notes (Signed)
Patient provided with 30 day Xaralto card

## 2018-08-09 NOTE — Progress Notes (Signed)
Brief cardiology note:  Chart reviewed, planned for discharge today. Per Dr. Charlott Rakes note yesterday, diuretic dosing per nephrology.   [facility] will sign off in anticipation of discharge.   Medication Recommendations: continue amiodarone, atorvastatin, carvedilol, hydralazine, imdur, ranolazine, rivaroxaban (monitor given renal functio). Diuretics per nephrology Other recommendations (labs, testing, etc):  Renal function f/u per nephrology Follow up as an outpatient:  Dr. Antoine Poche, has TOC on 08/14/18  Jodelle Red, MD, PhD [facility]  [address], [address] [place], [place] [address] [phone]

## 2018-08-09 NOTE — Progress Notes (Signed)
Patient sleeping during shift report.      

## 2018-08-09 NOTE — Plan of Care (Signed)
  Problem: Education: Goal: Knowledge of General Education information will improve Description Including pain rating scale, medication(s)/side effects and non-pharmacologic comfort measures Outcome: Progressing   Problem: Health Behavior/Discharge Planning: Goal: Ability to manage health-related needs will improve Outcome: Progressing   Problem: Clinical Measurements: Goal: Ability to maintain clinical measurements within normal limits will improve Outcome: Progressing Goal: Will remain free from infection Outcome: Progressing Goal: Diagnostic test results will improve Outcome: Progressing Goal: Respiratory complications will improve Outcome: Progressing Goal: Cardiovascular complication will be avoided Outcome: Progressing   Problem: Activity: Goal: Risk for activity intolerance will decrease Outcome: Progressing   Problem: Coping: Goal: Level of anxiety will decrease Outcome: Progressing   Problem: Elimination: Goal: Will not experience complications related to bowel motility Outcome: Progressing Goal: Will not experience complications related to urinary retention Outcome: Progressing   Problem: Pain Managment: Goal: General experience of comfort will improve Outcome: Progressing   Problem: Safety: Goal: Ability to remain free from injury will improve Outcome: Progressing   Problem: Skin Integrity: Goal: Risk for impaired skin integrity will decrease Outcome: Progressing   Problem: Education: Goal: Understanding of cardiac disease, CV risk reduction, and recovery process will improve Outcome: Progressing Goal: Individualized Educational Video(s) Outcome: Progressing   Problem: Activity: Goal: Ability to tolerate increased activity will improve Outcome: Progressing   Problem: Cardiac: Goal: Ability to achieve and maintain adequate cardiovascular perfusion will improve Outcome: Progressing   Problem: Health Behavior/Discharge Planning: Goal: Ability to  safely manage health-related needs after discharge will improve Outcome: Progressing   Problem: Education: Goal: Ability to demonstrate management of disease process will improve Outcome: Progressing Goal: Ability to verbalize understanding of medication therapies will improve Outcome: Progressing Goal: Individualized Educational Video(s) Outcome: Progressing   Problem: Activity: Goal: Capacity to carry out activities will improve Outcome: Progressing   Problem: Cardiac: Goal: Ability to achieve and maintain adequate cardiopulmonary perfusion will improve Outcome: Progressing

## 2018-08-09 NOTE — Progress Notes (Signed)
  [facility] Progress Note    Assessment/ Plan:   1.Chronic kidney disease, stage IV: Followed by Dr. Kristian Covey but hasn't been seen in > 1 year per pt.  He is near his baseline creatinine.  I expect some fluctuation in creatinine with diuresis, especially in the setting of his likely underlying cardiorenal syndrome.  Good diuresis on IV Lasix, torsemide 40 mg daily started today (initially I thought 80 mg daily but reduced to 40 based on Cr and weights)  He will get bloodwork with PCP Monday or Tuesday- he confirms this with me. Will need to get back in with Dr. Kristian Covey as well in the next 1-2 weeks which I told pt too.  No ACEi/ARB for now.  2.  NICM with EF 15%- per cardiology.  Getting AC for possible apical thrombus  3.  PHTN: on RHC from 12/3.  Severely elevated PA pressures.    4.  DM: per primary  5.  Dispo: OK to d/c today as long as pt gets close followup  Subjective:    Cr up a little, pt very eager to d/c today.     Objective:   BP (!) 133/103   Pulse 63   Temp 98.3 F (36.8 C) (Oral)   Resp 18   Ht 5\' 10"  (1.778 m)   Wt 84.7 kg   SpO2 97%   BMI 26.80 kg/m   Intake/Output Summary (Last 24 hours) at 08/09/2018 1253 Last data filed at 08/09/2018 0955 Gross per 24 hour  Intake 1086 ml  Output 1550 ml  Net -464 ml   Weight change: -1.361 kg  Physical Exam: GEN NAD, lying flat in bed HEENT EOMI PERRL NECK no obvious JVD PULM clear now CV RRR, no m/r/g ABD soft, no more distention EXT trace LE edema NEURO AAO x 3 nonfocal SKIN warm and dry, no rashes  Imaging: No results found.  Labs: BMET Recent Labs  Lab 08/03/18 0312 08/04/18 0818 08/05/18 0505 08/06/18 0350 08/07/18 0430 08/08/18 0501 08/09/18 0702  NA 138 140 138 140 139 141 138  K 3.8 4.0 3.6 3.8 3.5 3.6 3.6  CL 99 99 96* 97* 97* 97* 97*  CO2 27 31 32 29 29 31 28   GLUCOSE 164* 111* 100* 118* 121* 104* 145*  BUN 53* 49* 47* 50* 46* 48* 56*  CREATININE 3.80* 3.54*  3.68* 3.62* 3.46* 3.83* 4.35*  CALCIUM 8.3* 8.2* 8.5* 9.0 9.0 9.4 9.3   CBC Recent Labs  Lab 08/06/18 0350 08/07/18 0430 08/08/18 0501 08/09/18 0513  WBC 7.7 7.5 7.9 8.4  NEUTROABS 5.2 5.4 5.5 5.8  HGB 12.2* 12.7* 13.7 13.6  HCT 39.5 41.4 43.9 42.9  MCV 89.2 89.6 89.0 88.1  PLT 303 312 329 304    Medications:    . amiodarone  200 mg Oral Daily  . atorvastatin  80 mg Oral Daily  . calcitRIOL  0.25 mcg Oral Q M,W,F  . carvedilol  25 mg Oral BID WC  . cholecalciferol  1,000 Units Oral Daily  . febuxostat  80 mg Oral Daily  . fluticasone  1 spray Each Nare Daily  . hydrALAZINE  75 mg Oral TID  . isosorbide mononitrate  90 mg Oral Daily  . mometasone-formoterol  2 puff Inhalation BID  . ranolazine  500 mg Oral BID  . rivaroxaban  15 mg Oral Q supper  . sodium chloride flush  3 mL Intravenous Q12H      Bufford Buttner MD 08/09/2018, 12:53 PM

## 2018-08-09 NOTE — Progress Notes (Addendum)
Page to Nephrology.  3e23 Elbert. Cr ^ 4.35, wanted to get ok to give 80demadex (first PO) given increase.   8937: Per E.Upton,MD ok to give 40 mg of torsemide.   3428:  Per Dr Isidoro Donning at bedside, Do not give any of it.    Will not proceed with dosing unless a new order presents.    Per Dr Isidoro Donning, Dr Alverda Skeans recommendation of 40mg  will stand, plus plan for discharge today.

## 2018-08-14 ENCOUNTER — Ambulatory Visit (INDEPENDENT_AMBULATORY_CARE_PROVIDER_SITE_OTHER): Payer: Medicare Other

## 2018-08-14 ENCOUNTER — Telehealth: Payer: Self-pay | Admitting: Cardiology

## 2018-08-14 DIAGNOSIS — I420 Dilated cardiomyopathy: Secondary | ICD-10-CM | POA: Diagnosis not present

## 2018-08-14 DIAGNOSIS — I5022 Chronic systolic (congestive) heart failure: Secondary | ICD-10-CM

## 2018-08-14 NOTE — Telephone Encounter (Signed)
Confirmed remote transmission w/ pt wife.   

## 2018-08-15 ENCOUNTER — Encounter: Payer: Self-pay | Admitting: Cardiology

## 2018-08-15 NOTE — Progress Notes (Signed)
Remote ICD transmission.   

## 2018-08-28 ENCOUNTER — Other Ambulatory Visit: Payer: Self-pay | Admitting: Internal Medicine

## 2018-09-04 ENCOUNTER — Encounter: Payer: Medicare Other | Admitting: Internal Medicine

## 2018-09-05 ENCOUNTER — Encounter: Payer: Self-pay | Admitting: Internal Medicine

## 2018-09-27 LAB — CUP PACEART REMOTE DEVICE CHECK
Battery Remaining Longevity: 156 mo
Battery Remaining Percentage: 100 %
Brady Statistic RA Percent Paced: 2 %
Brady Statistic RV Percent Paced: 2 %
Date Time Interrogation Session: 20191212060200
HighPow Impedance: 68 Ohm
Implantable Lead Implant Date: 20190530
Implantable Lead Implant Date: 20190530
Implantable Lead Location: 753859
Implantable Lead Location: 753860
Implantable Lead Model: 7741
Implantable Lead Serial Number: 440889
Lead Channel Impedance Value: 474 Ohm
Lead Channel Setting Pacing Amplitude: 2 V
Lead Channel Setting Pacing Amplitude: 2.5 V
Lead Channel Setting Pacing Pulse Width: 0.4 ms
Lead Channel Setting Sensing Sensitivity: 0.5 mV
MDC IDC LEAD SERIAL: 913152
MDC IDC MSMT LEADCHNL RA IMPEDANCE VALUE: 738 Ohm
MDC IDC PG IMPLANT DT: 20190530
Pulse Gen Serial Number: 547981

## 2018-09-30 NOTE — Progress Notes (Deleted)
Cardiology Office Note   Date:  09/30/2018   ID:  [name], DOB March 12, 1966, MRN [id number]  PCP:  Joette Catching, MD  Cardiologist:   Rollene Rotunda, MD    No chief complaint on file.     History of Present Illness: [name] is a 53 y.o. male who presents who presents for followup coronary disease and cardiomyopathy. He's had very difficult to control hypertension. Most recent echocardiogram demonstrate his EF is remaining low at 20%. He is status post ICD implant.  He has been noted to have atrial flutter.  Since he was last seen in the office he has had admission for acute on chronic systolic HF.  I reviewed these records for this visit.    He was diuresed 11 liters with a discharge weight of 186 lbs.  He had right heart cath with mean PA pressure of 48.  EF on echo was 15 - 20%.  ***    Presented with BNP of 3686, chest x-ray with pulmonary edema -2D echo on 12/2 showed EF of 15 to 20% with diffuse hypokinesis, grade 2 diastolic dysfunction Cardiology was consulted, patient was placed on IV Lasix for diuresis, negative balance of 11 L at the time of discharge, weight down from 197 at the time of admission to186 lbs at discharge -Patient underwent cardiac cath consistent with the severe pulmonary hypertension -Nephrology was consulted due to concern for cardiorenal syndrome, creatinine trending up, has been noncompliant with outpatient follow-up with his nephrologist.  He was placed on higher dose of Lasix with improvement in his symptoms however creatinine has been trending up, 4.3 at the time of discharge.  -Nephrology, Dr. Signe Colt recommended torsemide 40 mg daily and recheck labs early next week, this was explained to the patient.  States he has a follow-up appointment with his PCP on Tuesday.   -Strongly recommended to the patient to follow-up with his nephrologist Dr. Kristian Covey.  He says that he is doing well.  The patient denies any new symptoms such as chest  discomfort, neck or arm discomfort. There has been no new shortness of breath, PND or orthopnea. There have been no reported palpitations, presyncope or syncope.   Past Medical History:  Diagnosis Date  . AICD (automatic cardioverter/defibrillator) present    a. 01/2018 BSX Vigilant EL dual chamber AICD. Ser# [id number].  Marland Kitchen Anemia    a. 2008 in setting of profound epistaxis.  Marland Kitchen CAD (coronary artery disease)    a. NSTEMI 2008: in setting of profound epistaxis/anemia, cath showing severe diffuse RCA dz with L-R collaterals, nonobst dz in left system, managed medically; b. Type II NSTEMI 10/2008; c. Cath 2012 - for med rx; d. 05/2013: elevated troponin ? due to ARF;  e. Ranexa added 03/2012.  Marland Kitchen CKD (chronic kidney disease), stage IV (HCC)    a. Average creatine of about 2.3  . Epistaxis    a. 2008, 2009 a/w significant anemia.  . HFrEF (heart failure with reduced ejection fraction) (HCC)   . History of pneumonia 08/2012, 05/2013  . Hyperlipidemia   . Hypertension    a. Difficult to control. b. Renal duplex neg for RAS 11/2012.  . Mixed ischemic and nonischemic cardiomyopathy (HCC)    a. Mixed ICM/NICM - Prior EF 25% back to 2008; b. Improved to 55% in 2012; c. Subsequent echoes: 45% in 11/2010, 15% by echo 05/2013 & 2016;  d. 12/2017 Echo: EF 15%, restrictive phys, mild conc and mod focal basal septal LVH. Mild AI. Sev  BAE. Sev red RV fxn. Mod tR. PASP 76mmHg.  Marland Kitchen. NSVT (nonsustained ventricular tachycardia) (HCC)    a. s/p ICD.  On amio.  Marland Kitchen. SVT (supraventricular tachycardia) (HCC)    a. 05/2013 tx with 6mg  adenosine in ER.    Past Surgical History:  Procedure Laterality Date  . CARDIAC CATHETERIZATION  2012   total RCA occlusion with left-to-right collaterals supplying the distal RCA branches, mild diffuse nonobstructive disease of LAD and LCx. Medically managed  . ICD IMPLANT N/A 01/30/2018   Procedure: ICD IMPLANT;  Surgeon: Marinus Mawaylor, Gregg W, MD;  [facility];  Service: Cardiovascular;   Laterality: N/A;  . RIGHT HEART CATH N/A 08/05/2018   Procedure: RIGHT HEART CATH;  Surgeon: Kathleene HazelMcAlhany, Christopher D, MD;  Location: [facility] INVASIVE CV LAB;  Service: Cardiovascular;  Laterality: N/A;     Current Outpatient Prescriptions  Medication Sig Dispense Refill  . aspirin 325 MG EC tablet Take 325 mg by mouth daily.    Marland Kitchen. atorvastatin (LIPITOR) 40 MG tablet TAKE 1 TABLET BY MOUTH DAILY AT 6PM. PLEASE SCHEDULE FOLLOW UP APPOINTMENT 30 tablet 0  . calcitRIOL (ROCALTROL) 0.25 MCG capsule Take 0.25 mcg by mouth 3 (three) times a week. On Monday, Wednesday, and Friday.    . carvedilol (COREG) 25 MG tablet TAKE 2 TABLETS BY MOUTH TWO TIMES DAILY WITH A MEAL 120 tablet 4  . cholecalciferol (VITAMIN D) 1000 UNITS tablet Take 1,000 Units by mouth daily.    . Febuxostat (ULORIC) 80 MG TABS Take 80 mg by mouth daily.    . hydrALAZINE (APRESOLINE) 100 MG tablet Take 1 tablet (100 mg total) by mouth daily. 90 tablet 1  . HYDROcodone-acetaminophen (NORCO/VICODIN) 5-325 MG per tablet Take 1 tablet by mouth every 4 (four) hours as needed for moderate pain. 15 tablet 0  . isosorbide mononitrate (IMDUR) 60 MG 24 hr tablet TAKE ONE TABLET BY MOUTH EVERY DAY PATIENT NEEDS OFFICE VISIT* 30 tablet 0  . nitroGLYCERIN (NITROSTAT) 0.4 MG SL tablet Place 1 tablet (0.4 mg total) under the tongue every 5 (five) minutes as needed for chest pain. KEEP OV. 25 tablet 0  . potassium chloride SA (K-DUR,KLOR-CON) 20 MEQ tablet TAKE 1 TABLET BY MOUTH TWO TIMES DAILY 60 tablet 4  . RANEXA 500 MG 12 hr tablet TAKE 1 TABLET BY MOUTH TWO TIMES DAILY 60 tablet 4  . torsemide (DEMADEX) 20 MG tablet Take 20 mg by mouth 2 (two) times daily.     No current facility-administered medications for this visit.     Allergies:   Patient has no known allergies.    ROS:  Please see the history of present illness.   Otherwise, review of systems are positive for **.   All other systems are reviewed and negative.    PHYSICAL EXAM: VS:   There were no vitals taken for this visit. , BMI There is no height or weight on file to calculate BMI.  GENERAL:  Well appearing NECK:  No jugular venous distention, waveform within normal limits, carotid upstroke brisk and symmetric, no bruits, no thyromegaly LUNGS:  Clear to auscultation bilaterally CHEST:  Well healed ICD pocket HEART:  PMI not displaced or sustained,S1 and S2 within normal limits, no S3, no S4, no clicks, no rubs, *** murmurs ABD:  Flat, positive bowel sounds normal in frequency in pitch, no bruits, no rebound, no guarding, no midline pulsatile mass, no hepatomegaly, no splenomegaly EXT:  2 plus pulses throughout, no edema, no cyanosis no clubbing     ***  GENERAL:  Well appearing NECK:  No jugular venous distention, waveform within normal limits, carotid upstroke brisk and symmetric, no bruits, no thyromegaly LUNGS:  Clear to auscultation bilaterally CHEST:  His ICD had a small retained stitch which I removed.  HEART:  PMI not displaced or sustained,S1 and S2 within normal limits, no S3, no S4, no clicks, no rubs, no murmurs ABD:  Flat, positive bowel sounds normal in frequency in pitch, no bruits, no rebound, no guarding, no midline pulsatile mass, no hepatomegaly, no splenomegaly EXT:  2 plus pulses throughout, no edema, no cyanosis no clubbing   EKG:  EKG is ***ordered today.   Recent Labs: 01/02/2018: ALT 12; TSH 3.314 01/03/2018: Magnesium 1.8 08/03/2018: B Natriuretic Peptide 3,686.6 08/09/2018: BUN 56; Creatinine, Ser 4.35; Hemoglobin 13.6; Platelets 304; Potassium 3.6; Sodium 138    Lipid Panel    Wt Readings from Last 3 Encounters:  08/09/18 186 lb 12.8 oz (84.7 kg)  05/15/18 203 lb (92.1 kg)  03/19/18 209 lb (94.8 kg)      Other studies Reviewed: Additional studies/ records that were reviewed today include: Hospital records Review of the above records demonstrates: See elsewhere  ASSESSMENT AND PLAN:  CAD -  The patient has *** no new sypmtoms.   No further cardiovascular testing is indicated.  We will continue with aggressive risk reduction and meds as listed.  ACUTE ON CHRONIC SYSTOLIC HEART FAILURE -  I have been trying to get him to take his hydralazine more frequently and I reiterated again today that they should be twice daily at least.  CHRONIC KIDNEY DISEASE UNSPECIFIED - His last creatinine was as above.  *** He has an appointment with nephrology next week.  HYPERTENSION - The blood pressure is *** at target. No change in medications is indicated. We will continue with therapeutic lifestyle changes (TLC).  This is being managed in the context of treating his CHF  ATRIAL FLUTTER  - He has been on amiodarone and Xarelto.  ***   Current medicines are reviewed at length with the patient today.  The patient does not have concerns regarding medicines.  The following changes have been made:  ***  Labs/ tests ordered today include:  ***  No orders of the defined types were placed in this encounter.    Disposition:   FU with me in *** months.     Signed, Rollene Rotunda, MD  09/30/2018 11:53 AM    [facility]

## 2018-10-01 ENCOUNTER — Ambulatory Visit: Payer: Medicare Other | Admitting: Cardiology

## 2018-11-13 ENCOUNTER — Encounter: Payer: Medicare Other | Admitting: *Deleted

## 2018-11-14 ENCOUNTER — Telehealth: Payer: Self-pay

## 2018-11-14 NOTE — Telephone Encounter (Signed)
Left message for patient to remind of missed remote transmission.  

## 2018-11-20 ENCOUNTER — Encounter: Payer: Self-pay | Admitting: Cardiology

## 2018-12-01 ENCOUNTER — Telehealth: Payer: Self-pay | Admitting: Cardiology

## 2018-12-01 NOTE — Telephone Encounter (Signed)
Patient sister called and stated that patient is in incarcerated at this time. She does not know how long patient will be locked up b/c he has not been to court yet. She state that jail took his medications but would not take his home monitor.  She wanted to know what she should do w/ his home monitor. I informed her to keep the monitor b/c patient will need it once he gets out of jail. Pt sister verbalized understanding.

## 2018-12-02 NOTE — Telephone Encounter (Signed)
No change. No monitoring in jail. GT

## 2018-12-03 ENCOUNTER — Encounter: Payer: Self-pay | Admitting: Cardiology

## 2018-12-03 NOTE — Telephone Encounter (Signed)
noted 

## 2019-05-24 IMAGING — CR DG CHEST 1V PORT
1 series · 1 of 1 positions shown · non-contrast
Comparison: 12/30/2017 and earlier.

CLINICAL DATA: 51-year-old male with chest pain and shortness of
breath. Suspected heart failure.

EXAM:
PORTABLE CHEST 1 VIEW

[ap portable]
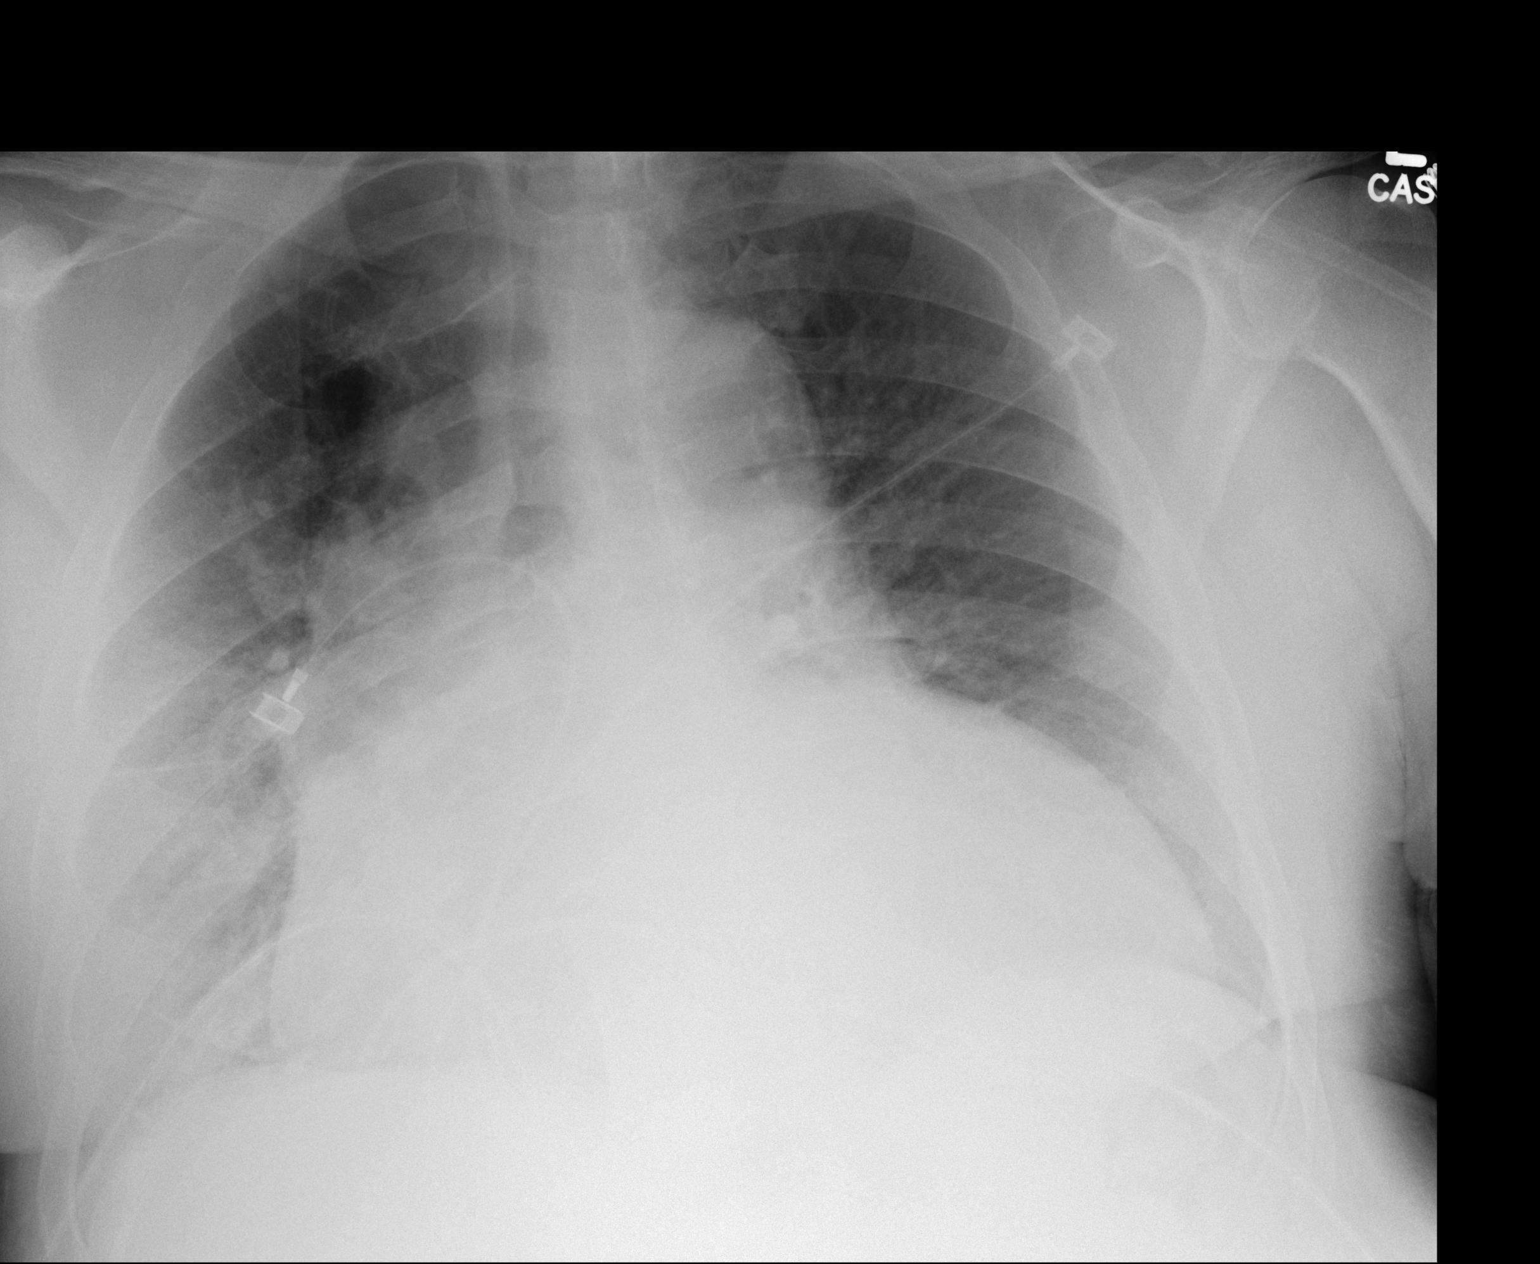

[1 of 1 positions shown; findings below may reference images not displayed]

FINDINGS: Portable AP upright view at 4226 hours. Stable cardiomegaly and
mediastinal contours. Increased asymmetric patchy and indistinct
opacity in the right lower lung since yesterday. Multilobar
involvement sparing the right lung apex. No pneumothorax. Pulmonary
vascularity appears decreased elsewhere. No pleural effusion is
evident. No other confluent opacity. Visualized tracheal air column
is within normal limits.
IMPRESSION: 1. New asymmetric, indistinct right mid and lower lung opacity since
yesterday. Perhaps this is asymmetric pulmonary edema, but
multilobar right lung infection is not excluded.
2. Decreased pulmonary vascularity elsewhere. Stable moderate to
severe cardiomegaly.

## 2019-10-06 ENCOUNTER — Telehealth: Payer: Self-pay | Admitting: *Deleted

## 2019-10-06 NOTE — Telephone Encounter (Signed)
A message was left, re: his follow up visit. 

## 2020-01-12 ENCOUNTER — Encounter: Payer: Self-pay | Admitting: Cardiology

## 2022-06-03 DEATH — deceased
